# Patient Record
Sex: Female | Born: 1945 | Race: White | Hispanic: No | Smoking: Former smoker
Health system: Southern US, Community
[De-identification: ages and names within clinical notes are randomized; demographics above are authoritative.]

## PROBLEM LIST (undated history)

## (undated) DIAGNOSIS — E039 Hypothyroidism, unspecified: Secondary | ICD-10-CM

## (undated) DIAGNOSIS — K219 Gastro-esophageal reflux disease without esophagitis: Secondary | ICD-10-CM

## (undated) DIAGNOSIS — R195 Other fecal abnormalities: Secondary | ICD-10-CM

## (undated) DIAGNOSIS — C439 Malignant melanoma of skin, unspecified: Secondary | ICD-10-CM

## (undated) DIAGNOSIS — T7840XA Allergy, unspecified, initial encounter: Secondary | ICD-10-CM

## (undated) DIAGNOSIS — E785 Hyperlipidemia, unspecified: Secondary | ICD-10-CM

## (undated) DIAGNOSIS — R197 Diarrhea, unspecified: Secondary | ICD-10-CM

## (undated) DIAGNOSIS — R011 Cardiac murmur, unspecified: Secondary | ICD-10-CM

## (undated) DIAGNOSIS — Z5189 Encounter for other specified aftercare: Secondary | ICD-10-CM

## (undated) DIAGNOSIS — Z8719 Personal history of other diseases of the digestive system: Secondary | ICD-10-CM

## (undated) DIAGNOSIS — D509 Iron deficiency anemia, unspecified: Secondary | ICD-10-CM

## (undated) DIAGNOSIS — T8859XA Other complications of anesthesia, initial encounter: Secondary | ICD-10-CM

## (undated) DIAGNOSIS — R0600 Dyspnea, unspecified: Secondary | ICD-10-CM

## (undated) DIAGNOSIS — D649 Anemia, unspecified: Secondary | ICD-10-CM

## (undated) DIAGNOSIS — D126 Benign neoplasm of colon, unspecified: Secondary | ICD-10-CM

## (undated) DIAGNOSIS — C4491 Basal cell carcinoma of skin, unspecified: Secondary | ICD-10-CM

## (undated) DIAGNOSIS — K644 Residual hemorrhoidal skin tags: Secondary | ICD-10-CM

## (undated) DIAGNOSIS — T4145XA Adverse effect of unspecified anesthetic, initial encounter: Secondary | ICD-10-CM

## (undated) DIAGNOSIS — E042 Nontoxic multinodular goiter: Secondary | ICD-10-CM

## (undated) DIAGNOSIS — M810 Age-related osteoporosis without current pathological fracture: Secondary | ICD-10-CM

## (undated) DIAGNOSIS — C799 Secondary malignant neoplasm of unspecified site: Secondary | ICD-10-CM

## (undated) DIAGNOSIS — J45909 Unspecified asthma, uncomplicated: Secondary | ICD-10-CM

## (undated) DIAGNOSIS — M1990 Unspecified osteoarthritis, unspecified site: Secondary | ICD-10-CM

## (undated) DIAGNOSIS — C4492 Squamous cell carcinoma of skin, unspecified: Secondary | ICD-10-CM

## (undated) DIAGNOSIS — H269 Unspecified cataract: Secondary | ICD-10-CM

## (undated) DIAGNOSIS — R350 Frequency of micturition: Secondary | ICD-10-CM

## (undated) HISTORY — DX: Benign neoplasm of colon, unspecified: D12.6

## (undated) HISTORY — DX: Secondary malignant neoplasm of unspecified site: C79.9

## (undated) HISTORY — DX: Basal cell carcinoma of skin, unspecified: C44.91

## (undated) HISTORY — DX: Hyperlipidemia, unspecified: E78.5

## (undated) HISTORY — DX: Malignant melanoma of skin, unspecified: C43.9

## (undated) HISTORY — DX: Cardiac murmur, unspecified: R01.1

## (undated) HISTORY — PX: SPINE SURGERY: SHX786

## (undated) HISTORY — DX: Anemia, unspecified: D64.9

## (undated) HISTORY — PX: TOTAL HIP ARTHROPLASTY: SHX124

## (undated) HISTORY — DX: Residual hemorrhoidal skin tags: K64.4

## (undated) HISTORY — DX: Unspecified cataract: H26.9

## (undated) HISTORY — DX: Encounter for other specified aftercare: Z51.89

## (undated) HISTORY — DX: Allergy, unspecified, initial encounter: T78.40XA

## (undated) HISTORY — DX: Gastro-esophageal reflux disease without esophagitis: K21.9

## (undated) HISTORY — DX: Age-related osteoporosis without current pathological fracture: M81.0

## (undated) HISTORY — DX: Hypothyroidism, unspecified: E03.9

## (undated) HISTORY — DX: Iron deficiency anemia, unspecified: D50.9

## (undated) HISTORY — PX: FRACTURE SURGERY: SHX138

## (undated) HISTORY — PX: JOINT REPLACEMENT: SHX530

## (undated) HISTORY — DX: Nontoxic multinodular goiter: E04.2

## (undated) HISTORY — PX: ORIF ANKLE FRACTURE: SUR919

## (undated) HISTORY — DX: Squamous cell carcinoma of skin, unspecified: C44.92

## (undated) HISTORY — DX: Other fecal abnormalities: R19.5

---

## 1982-07-12 HISTORY — PX: ABDOMINAL HYSTERECTOMY: SHX81

## 1988-07-12 HISTORY — PX: CHOLECYSTECTOMY: SHX55

## 1991-07-13 DIAGNOSIS — C4372 Malignant melanoma of left lower limb, including hip: Secondary | ICD-10-CM | POA: Insufficient documentation

## 1991-07-13 HISTORY — PX: MELANOMA EXCISION: SHX5266

## 1999-05-12 LAB — HM PAP SMEAR

## 2004-07-02 ENCOUNTER — Ambulatory Visit: Payer: Self-pay | Admitting: Internal Medicine

## 2004-12-01 ENCOUNTER — Ambulatory Visit: Payer: Self-pay | Admitting: General Surgery

## 2004-12-04 ENCOUNTER — Ambulatory Visit: Payer: Self-pay | Admitting: General Surgery

## 2004-12-24 ENCOUNTER — Inpatient Hospital Stay: Payer: Self-pay | Admitting: General Surgery

## 2005-01-18 ENCOUNTER — Ambulatory Visit: Payer: Self-pay | Admitting: Oncology

## 2005-03-01 ENCOUNTER — Ambulatory Visit: Payer: Self-pay | Admitting: Oncology

## 2005-03-12 ENCOUNTER — Ambulatory Visit: Payer: Self-pay | Admitting: Oncology

## 2005-04-11 ENCOUNTER — Ambulatory Visit: Payer: Self-pay | Admitting: Oncology

## 2005-05-24 ENCOUNTER — Ambulatory Visit: Payer: Self-pay | Admitting: Oncology

## 2005-06-14 ENCOUNTER — Ambulatory Visit: Payer: Self-pay | Admitting: Oncology

## 2005-07-12 ENCOUNTER — Ambulatory Visit: Payer: Self-pay | Admitting: Oncology

## 2005-09-15 ENCOUNTER — Ambulatory Visit: Payer: Self-pay | Admitting: Oncology

## 2005-10-22 ENCOUNTER — Ambulatory Visit: Payer: Self-pay | Admitting: Oncology

## 2005-12-03 ENCOUNTER — Ambulatory Visit: Payer: Self-pay | Admitting: Oncology

## 2005-12-10 HISTORY — PX: OTHER SURGICAL HISTORY: SHX169

## 2006-01-14 ENCOUNTER — Ambulatory Visit: Payer: Self-pay | Admitting: Oncology

## 2006-02-25 ENCOUNTER — Ambulatory Visit: Payer: Self-pay | Admitting: Oncology

## 2006-04-01 ENCOUNTER — Ambulatory Visit: Payer: Self-pay | Admitting: Oncology

## 2006-04-08 ENCOUNTER — Ambulatory Visit: Payer: Self-pay | Admitting: Oncology

## 2006-07-21 ENCOUNTER — Ambulatory Visit: Payer: Self-pay | Admitting: Oncology

## 2006-08-12 HISTORY — PX: COLONOSCOPY: SHX174

## 2006-09-05 ENCOUNTER — Ambulatory Visit: Payer: Self-pay | Admitting: Unknown Physician Specialty

## 2006-10-21 ENCOUNTER — Ambulatory Visit: Payer: Self-pay | Admitting: Oncology

## 2006-11-10 ENCOUNTER — Ambulatory Visit: Payer: Self-pay | Admitting: Oncology

## 2007-01-10 ENCOUNTER — Ambulatory Visit: Payer: Self-pay | Admitting: Oncology

## 2007-01-17 ENCOUNTER — Ambulatory Visit: Payer: Self-pay | Admitting: General Surgery

## 2007-01-20 ENCOUNTER — Ambulatory Visit: Payer: Self-pay | Admitting: Oncology

## 2007-01-27 ENCOUNTER — Ambulatory Visit: Payer: Self-pay | Admitting: Oncology

## 2007-02-10 ENCOUNTER — Ambulatory Visit: Payer: Self-pay | Admitting: Oncology

## 2007-05-13 ENCOUNTER — Ambulatory Visit: Payer: Self-pay | Admitting: Oncology

## 2007-05-31 ENCOUNTER — Ambulatory Visit: Payer: Self-pay | Admitting: Oncology

## 2007-06-12 ENCOUNTER — Ambulatory Visit: Payer: Self-pay | Admitting: Oncology

## 2007-07-13 ENCOUNTER — Ambulatory Visit: Payer: Self-pay | Admitting: Oncology

## 2007-08-13 ENCOUNTER — Ambulatory Visit: Payer: Self-pay | Admitting: Oncology

## 2007-08-18 ENCOUNTER — Ambulatory Visit: Payer: Self-pay | Admitting: General Surgery

## 2007-11-10 ENCOUNTER — Ambulatory Visit: Payer: Self-pay | Admitting: Oncology

## 2007-12-11 ENCOUNTER — Ambulatory Visit: Payer: Self-pay | Admitting: Oncology

## 2007-12-27 ENCOUNTER — Ambulatory Visit: Payer: Self-pay | Admitting: Oncology

## 2007-12-29 ENCOUNTER — Ambulatory Visit: Payer: Self-pay | Admitting: Oncology

## 2008-06-11 ENCOUNTER — Ambulatory Visit: Payer: Self-pay | Admitting: Oncology

## 2008-07-01 ENCOUNTER — Ambulatory Visit: Payer: Self-pay | Admitting: Oncology

## 2008-07-12 ENCOUNTER — Ambulatory Visit: Payer: Self-pay | Admitting: Oncology

## 2008-09-09 ENCOUNTER — Ambulatory Visit: Payer: Self-pay | Admitting: Oncology

## 2008-09-30 ENCOUNTER — Ambulatory Visit: Payer: Self-pay | Admitting: Oncology

## 2008-10-03 ENCOUNTER — Ambulatory Visit: Payer: Self-pay | Admitting: Oncology

## 2008-10-10 ENCOUNTER — Ambulatory Visit: Payer: Self-pay | Admitting: Oncology

## 2009-03-12 ENCOUNTER — Ambulatory Visit: Payer: Self-pay | Admitting: Oncology

## 2009-04-09 ENCOUNTER — Ambulatory Visit: Payer: Self-pay | Admitting: Oncology

## 2009-04-11 ENCOUNTER — Ambulatory Visit: Payer: Self-pay | Admitting: Oncology

## 2009-04-23 ENCOUNTER — Ambulatory Visit: Payer: Self-pay | Admitting: Oncology

## 2009-05-29 ENCOUNTER — Ambulatory Visit: Payer: Self-pay | Admitting: Internal Medicine

## 2009-09-09 ENCOUNTER — Ambulatory Visit: Payer: Self-pay | Admitting: Oncology

## 2009-09-10 ENCOUNTER — Ambulatory Visit: Payer: Self-pay | Admitting: Unknown Physician Specialty

## 2009-09-10 HISTORY — PX: COLONOSCOPY: SHX174

## 2009-09-10 HISTORY — PX: ESOPHAGOGASTRODUODENOSCOPY: SHX1529

## 2009-09-10 LAB — HM COLONOSCOPY: HM Colonoscopy: 8

## 2009-10-06 ENCOUNTER — Ambulatory Visit: Payer: Self-pay | Admitting: Oncology

## 2009-10-10 ENCOUNTER — Ambulatory Visit: Payer: Self-pay | Admitting: Oncology

## 2010-02-09 ENCOUNTER — Ambulatory Visit: Payer: Self-pay | Admitting: Oncology

## 2010-03-05 ENCOUNTER — Ambulatory Visit: Payer: Self-pay | Admitting: Oncology

## 2010-03-09 ENCOUNTER — Ambulatory Visit: Payer: Self-pay | Admitting: Oncology

## 2010-05-18 ENCOUNTER — Ambulatory Visit: Payer: Self-pay | Admitting: Internal Medicine

## 2010-06-10 LAB — HM MAMMOGRAPHY: HM Mammogram: NORMAL

## 2010-08-20 ENCOUNTER — Encounter: Payer: Self-pay | Admitting: Cardiovascular Disease

## 2010-09-02 ENCOUNTER — Ambulatory Visit: Payer: Self-pay | Admitting: Internal Medicine

## 2010-09-02 ENCOUNTER — Telehealth (INDEPENDENT_AMBULATORY_CARE_PROVIDER_SITE_OTHER): Payer: Self-pay | Admitting: Radiology

## 2010-09-03 ENCOUNTER — Encounter (HOSPITAL_COMMUNITY): Payer: Self-pay

## 2010-09-08 NOTE — Progress Notes (Signed)
Summary: nuc pre-procedure  Phone Note Outgoing Call   Call placed by: Harlow Asa CNMT Call placed to: Patient Reason for Call: Confirm/change Appt Summary of Call: Reviewed information on [employer] Information Sheet (see scanned document for further details).  Spoke with patient.      Nuclear Med Background Indications for Stress Test: Evaluation for Ischemia     Symptoms: Chest Pain, Chest Pressure with Exertion, DOE, Fatigue  Symptoms Comments: muscle weakness

## 2010-09-10 ENCOUNTER — Ambulatory Visit: Payer: Self-pay | Admitting: Oncology

## 2010-09-22 ENCOUNTER — Ambulatory Visit: Payer: Self-pay | Admitting: Cardiovascular Disease

## 2010-09-30 ENCOUNTER — Ambulatory Visit (INDEPENDENT_AMBULATORY_CARE_PROVIDER_SITE_OTHER): Payer: BC Managed Care – PPO

## 2010-09-30 ENCOUNTER — Encounter: Payer: Self-pay | Admitting: Cardiovascular Disease

## 2010-09-30 ENCOUNTER — Ambulatory Visit (INDEPENDENT_AMBULATORY_CARE_PROVIDER_SITE_OTHER): Payer: BC Managed Care – PPO | Admitting: Cardiovascular Disease

## 2010-09-30 VITALS — BP 120/72 | HR 91 | Resp 16

## 2010-09-30 VITALS — BP 146/80 | HR 81 | Ht 63.0 in | Wt 220.8 lb

## 2010-09-30 DIAGNOSIS — R0789 Other chest pain: Secondary | ICD-10-CM

## 2010-09-30 DIAGNOSIS — R0602 Shortness of breath: Secondary | ICD-10-CM | POA: Insufficient documentation

## 2010-09-30 DIAGNOSIS — E785 Hyperlipidemia, unspecified: Secondary | ICD-10-CM | POA: Insufficient documentation

## 2010-09-30 NOTE — Progress Notes (Signed)
Janet Rice was exercised on a manual protocol as she had significant leg pain with exertion. Reason for the treadmill study was she was having shortness of breath and chest pain with exertion  Resting blood pressure was 120/70, resting heart rate 91 beats per minute. She exercised for a total of 10 minutes, achieved 6.6 METS, with peak heart rate 154 beats per minute. Blood pressure 178/82. No significant EKG changes were noted with exertion at peak or in recovery.  Final impression: This was a normal treadmill study with good exercise tolerance given her underlying leg and lower back disease with no significant EKG changes concerning for ischemia. Peak heart rate 154 beats per minute with no symptoms of chest discomfort or shortness of breath noted. I suspect her recent chest pain episodes are atypical and probably  Noncardiac.

## 2010-09-30 NOTE — Assessment & Plan Note (Signed)
She does have hyperlipidemia. We have encouraged her to watch her diet, lose weight. I suggested she discuss various medication treatment options with Dr. Dan Humphreys.

## 2010-09-30 NOTE — Patient Instructions (Addendum)
Your physician recommends that you schedule a follow-up as needed. Start taking Aspirin 81mg  every day.

## 2010-09-30 NOTE — Progress Notes (Signed)
Patient ID: Janet Rice, female    DOB: May 05, 1946, 65 y.o.   MRN: [id number]  HPI Janet Rice is a very pleasant 65 year old woman with a history of obesity, hyperlipidemia, lumbar stenosis, chronic left leg pain status post lymph node resection for melanoma who presents for evaluation of worsening shortness of breath, chest pain with exertion. She is a patient of Dr. Ronna Polio.  She reports that she has had worsening shortness of breath and chest pain with exertion for the past several years though this has been getting worse over the past month or so. She notices it more when she is climbing up a hill or stairs. She denies any significant lightheadedness, dizziness, cough or lower extremity edema. She does not have these symptoms at rest. She comments that she hasn't noticed this discomfort while trying to keep up with her husband, climbing the way up to go to baseball games. She is uncertain if she is anxious, hunching over or clenching her shoulders.   WUJ:[id number] normal sinus rhythm with rate of 81 beats per minute, low voltage, no significant ST or T wave changes   Allergies  Allergen Reactions  . Sulfa Antibiotics     Outpatient Encounter Prescriptions as of 09/30/2010  Medication Sig Dispense Refill  . aspirin 81 MG tablet Take 81 mg by mouth daily.        . citalopram (CELEXA) 20 MG tablet Take 20 mg by mouth daily.        Marland Kitchen levothyroxine (SYNTHROID, LEVOTHROID) 137 MCG tablet Take 137 mcg by mouth daily.        Marland Kitchen omeprazole (PRILOSEC) 20 MG capsule Take 20 mg by mouth daily.        . pravastatin (PRAVACHOL) 40 MG tablet Take 20 mg by mouth daily.          Past Medical History  Diagnosis Date  . Hypothyroidism   . Hyperlipidemia     Past Surgical History  Procedure Date  . Abdominal hysterectomy 1980  . Melanoma excision 1993    thigh  . Lymph node removal 2006    Social History  reports that she quit smoking about 32 years ago. Her smoking use included  Cigarettes. She has a 4 pack-year smoking history. She has never used smokeless tobacco. She reports that she does not drink alcohol or use illicit drugs.  Family History family history includes Aneurysm in her mother.   Review of Systems  Constitutional: Negative.   HENT: Negative.   Eyes: Negative.   Respiratory: Negative.   Cardiovascular: Negative.   Gastrointestinal: Negative.   Musculoskeletal: Negative.   Skin: Negative.   Neurological: Negative.   Hematological: Negative.   Psychiatric/Behavioral: Negative.   All other systems reviewed and are negative.    BP 146/80  Pulse 81  Ht 5\' 3"  (1.6 m)  Wt 220 lb 12.8 oz (100.154 kg)  BMI 39.11 kg/m2   Physical Exam  Nursing note and vitals reviewed. Constitutional: She is oriented to person, place, and time. She appears well-developed and well-nourished.  HENT:  Head: Normocephalic.  Nose: Nose normal.  Mouth/Throat: Oropharynx is clear and moist.  Eyes: Conjunctivae are normal. Pupils are equal, round, and reactive to light.  Neck: Normal range of motion. Neck supple. No JVD present.  Cardiovascular: Normal rate, regular rhythm, normal heart sounds and intact distal pulses.  Exam reveals no gallop and no friction rub.   No murmur heard. Pulmonary/Chest: Effort normal and breath sounds normal. No respiratory distress. She has  no wheezes. She has no rales. She exhibits no tenderness.  Abdominal: Soft. Bowel sounds are normal. She exhibits no distension. There is no tenderness.  Musculoskeletal: Normal range of motion. She exhibits no edema and no tenderness.  Lymphadenopathy:    She has no cervical adenopathy.  Neurological: She is alert and oriented to person, place, and time. Coordination normal.  Skin: Skin is warm and dry. No rash noted. No erythema.  Psychiatric: She has a normal mood and affect. Her behavior is normal. Judgment and thought content normal.     Assessment and Plan

## 2010-09-30 NOTE — Assessment & Plan Note (Addendum)
She does have significant shortness of breath with heavy exertion. Some of this could be secondary to deconditioning. We will again have her complate a treadmill to watch her heart rate and blood pressure with exertion.

## 2010-09-30 NOTE — Assessment & Plan Note (Addendum)
Etiology of her chest tightness with exertion is uncertain. We have scheduled her for a treadmill study to exclude ischemia.

## 2010-09-30 NOTE — Progress Notes (Deleted)
   Patient ID: Janet Rice, female    DOB: 1946-01-19, 65 y.o.   MRN: [id number]  HPI    Review of Systems    Physical Exam Janet Rice was exercised on a manual protocol as she had significant leg pain with exertion. Reason for the treadmill study was she was having shortness of breath and chest pain with exertion  Resting blood pressure was 120/70, resting heart rate 91 beats per minute. She exercised for a total of 10 minutes, achieved 6.6 METS, with peak heart rate 154 beats per minute. Blood pressure 178/82. No significant EKG changes were noted with exertion at peak or in recovery.  Final impression: This was a normal treadmill study with good exercise tolerance given her underlying leg and lower back disease with no significant EKG changes concerning for ischemia. Peak heart rate 154 beats per minute with no symptoms of chest discomfort or shortness of breath noted. I suspect her recent chest pain episodes are atypical and probably  Noncardiac.

## 2010-10-11 ENCOUNTER — Ambulatory Visit: Payer: Self-pay | Admitting: Oncology

## 2011-01-14 ENCOUNTER — Ambulatory Visit: Payer: Self-pay | Admitting: Oncology

## 2011-02-10 ENCOUNTER — Ambulatory Visit: Payer: Self-pay | Admitting: Oncology

## 2011-02-18 ENCOUNTER — Encounter: Payer: Self-pay | Admitting: Internal Medicine

## 2011-03-13 ENCOUNTER — Ambulatory Visit: Payer: Self-pay | Admitting: Oncology

## 2011-04-12 ENCOUNTER — Ambulatory Visit: Payer: Self-pay | Admitting: Oncology

## 2011-04-27 ENCOUNTER — Telehealth: Payer: Self-pay | Admitting: Internal Medicine

## 2011-04-27 NOTE — Telephone Encounter (Signed)
Patient wants an appointment made for her Mammogram.

## 2011-04-27 NOTE — Telephone Encounter (Signed)
We can give her the number for [facility] and she can set up a time for her mammogram that is convenient for her.

## 2011-04-30 NOTE — Telephone Encounter (Signed)
Gave pt [facility] phone number to make her own mammogram appointment/[facility]

## 2011-05-12 ENCOUNTER — Encounter: Payer: Self-pay | Admitting: Internal Medicine

## 2011-05-12 ENCOUNTER — Ambulatory Visit (INDEPENDENT_AMBULATORY_CARE_PROVIDER_SITE_OTHER): Payer: Medicare Other | Admitting: Internal Medicine

## 2011-05-12 VITALS — BP 124/82 | HR 66 | Temp 98.3°F | Resp 14 | Ht 63.0 in | Wt 217.8 lb

## 2011-05-12 DIAGNOSIS — E785 Hyperlipidemia, unspecified: Secondary | ICD-10-CM

## 2011-05-12 DIAGNOSIS — E039 Hypothyroidism, unspecified: Secondary | ICD-10-CM

## 2011-05-12 DIAGNOSIS — D509 Iron deficiency anemia, unspecified: Secondary | ICD-10-CM

## 2011-05-12 DIAGNOSIS — Z23 Encounter for immunization: Secondary | ICD-10-CM

## 2011-05-12 DIAGNOSIS — Z8249 Family history of ischemic heart disease and other diseases of the circulatory system: Secondary | ICD-10-CM

## 2011-05-12 DIAGNOSIS — I10 Essential (primary) hypertension: Secondary | ICD-10-CM

## 2011-05-12 NOTE — Progress Notes (Signed)
Subjective:    Patient ID: Janet Rice, female    DOB: 1946/05/11, 65 y.o.   MRN: [id number]  HPI 65 year old female with a history of hypothyroidism and hyperlipidemia presents for followup. She reports that she has been feeling well. She continues to have some mild fatigue. She reports full compliance with her medications. She denies any side effects from her medications. She was recently treated for iron deficiency anemia by hematology with serial iron infusions. She denies any complications with this treatment. She reports that labs were completed after therapy in showing adequate iron stores. She notes that she was instructed to followup with GI for upper endoscopy and colonoscopy but would prefer a referral to another physician. She denies any black stools or blood in her stool. She denies any vomiting. She reports that she is sleeping well. She is active in her job.  Patient notes today that she has a strong family history of abdominal aortic aneurysm. She notes that her mother died suddenly of this. As above, she denies abdominal pain or other symptoms to suggest aneurysm, however she would like to get an ultrasound evaluation to ensure no aneurysm is present.  Outpatient Encounter Prescriptions as of 05/12/2011  Medication Sig Dispense Refill  . aspirin 81 MG tablet Take 81 mg by mouth daily.        . citalopram (CELEXA) 20 MG tablet Take 20 mg by mouth daily.        Marland Kitchen levothyroxine (SYNTHROID, LEVOTHROID) 137 MCG tablet Take 137 mcg by mouth daily.        Marland Kitchen omeprazole (PRILOSEC) 20 MG capsule Take 20 mg by mouth daily.        . pravastatin (PRAVACHOL) 40 MG tablet Take 20 mg by mouth daily.          Review of Systems  Constitutional: Positive for fatigue. Negative for fever, chills, appetite change and unexpected weight change.  HENT: Negative for ear pain, congestion, sore throat, trouble swallowing, neck pain, voice change and sinus pressure.   Eyes: Negative for visual  disturbance.  Respiratory: Negative for cough, shortness of breath, wheezing and stridor.   Cardiovascular: Negative for chest pain, palpitations and leg swelling.  Gastrointestinal: Negative for nausea, vomiting, abdominal pain, diarrhea, constipation, blood in stool, abdominal distention and anal bleeding.  Genitourinary: Negative for dysuria and flank pain.  Musculoskeletal: Negative for myalgias, arthralgias and gait problem.  Skin: Negative for color change and rash.  Neurological: Negative for dizziness and headaches.  Hematological: Negative for adenopathy. Does not bruise/bleed easily.  Psychiatric/Behavioral: Negative for suicidal ideas, sleep disturbance and dysphoric mood. The patient is not nervous/anxious.    BP 124/82  Pulse 66  Temp(Src) 98.3 F (36.8 C) (Oral)  Resp 14  Ht 5\' 3"  (1.6 m)  Wt 217 lb 12 oz (98.771 kg)  BMI 38.57 kg/m2  SpO2 98%     Objective:   Physical Exam  Constitutional: She is oriented to person, place, and time. She appears well-developed and well-nourished. No distress.  HENT:  Head: Normocephalic and atraumatic.  Right Ear: External ear normal.  Left Ear: External ear normal.  Nose: Nose normal.  Mouth/Throat: Oropharynx is clear and moist. No oropharyngeal exudate.  Eyes: Conjunctivae are normal. Pupils are equal, round, and reactive to light. Right eye exhibits no discharge. Left eye exhibits no discharge. No scleral icterus.  Neck: Normal range of motion. Neck supple. No tracheal deviation present. No thyromegaly present.  Cardiovascular: Normal rate, regular rhythm, normal heart sounds and  intact distal pulses.  Exam reveals no gallop and no friction rub.   No murmur heard. Pulmonary/Chest: Effort normal and breath sounds normal. No respiratory distress. She has no wheezes. She has no rales. She exhibits no tenderness.  Musculoskeletal: Normal range of motion. She exhibits no edema and no tenderness.  Lymphadenopathy:    She has no  cervical adenopathy.  Neurological: She is alert and oriented to person, place, and time. No cranial nerve deficit. She exhibits normal muscle tone. Coordination normal.  Skin: Skin is warm and dry. No rash noted. She is not diaphoretic. No erythema. No pallor.  Psychiatric: She has a normal mood and affect. Her behavior is normal. Judgment and thought content normal.          Assessment & Plan:  1. Hypothryoidism -will check TSH with labs today. TSH has been normal even in the setting of ongoing fatigue. We'll continue Synthroid at current dose and adjust as needed based on TSH level.  2. Hyperlipidemia -will check fasting lipids and LFTs with labs today. We'll continue pravastatin.  3. Iron deficiency anemia -patient was recently treated for iron deficiency anemia by hematology with serial iron infusions. The etiology of her iron deficiency was not determined. Her stool Hemoccult testing had been negative. However, would like to get upper and lower endoscopy. She would prefer a referral to [facility]. We will set this up for her. We will check CBC and ferritin with labs today.  4. Fatigue -patient with some chronic fatigue. This has not improved with supplementation of iron. As above we'll check labs including CBC, TSH, electrolytes, kidney and liver function today. If these are normal, would like to proceed with a sleep study to evaluate for sleep apnea as a potential cause of her fatigue. She will followup in one month.  5. Family hx of Abdominal Aortic Aneurysm - will set patient up for abdominal ultrasound for evaluation.

## 2011-05-12 NOTE — Patient Instructions (Signed)
Labs today.  Follow up in 3 months for physical. 

## 2011-05-17 ENCOUNTER — Encounter: Payer: Self-pay | Admitting: Internal Medicine

## 2011-05-18 ENCOUNTER — Telehealth: Payer: Self-pay | Admitting: Internal Medicine

## 2011-05-18 NOTE — Telephone Encounter (Signed)
I can write it on the form tomorrow.

## 2011-05-18 NOTE — Telephone Encounter (Signed)
Britt Boozer called in from [facility] states that family history won't cover the test you want her to have, they stated that it needs to see abdominal pain/ abdominal bruit.  They need this on the order please let me know when it is ready so I can send to them, she see's them on the 14th.  Thanks

## 2011-05-19 ENCOUNTER — Telehealth: Payer: Self-pay | Admitting: Internal Medicine

## 2011-05-19 NOTE — Telephone Encounter (Signed)
Labs from outside showed persistent iron deficiency with ferritin at 11, hgb 10. Can you see if pt has scheduled follow up with hematology and GI?

## 2011-05-19 NOTE — Telephone Encounter (Signed)
I have faxed over referral sheet to [facility] with abdominal pain.

## 2011-05-19 NOTE — Telephone Encounter (Signed)
Patient informed, she has apt w/Dr Margretta Sidle 11/26

## 2011-05-25 ENCOUNTER — Other Ambulatory Visit: Payer: Self-pay | Admitting: Internal Medicine

## 2011-05-27 ENCOUNTER — Telehealth: Payer: Self-pay | Admitting: Internal Medicine

## 2011-05-27 NOTE — Telephone Encounter (Signed)
[place] of abdominal aorta was normal

## 2011-05-27 NOTE — Telephone Encounter (Signed)
Left detailed VM on pt's cell 

## 2011-05-31 ENCOUNTER — Ambulatory Visit: Payer: Self-pay | Admitting: Internal Medicine

## 2011-05-31 ENCOUNTER — Other Ambulatory Visit: Payer: Self-pay | Admitting: Internal Medicine

## 2011-05-31 ENCOUNTER — Encounter: Payer: Self-pay | Admitting: Internal Medicine

## 2011-06-01 ENCOUNTER — Encounter: Payer: Self-pay | Admitting: Internal Medicine

## 2011-06-04 ENCOUNTER — Encounter: Payer: Self-pay | Admitting: Internal Medicine

## 2011-06-04 ENCOUNTER — Encounter: Payer: Self-pay | Admitting: *Deleted

## 2011-06-07 ENCOUNTER — Ambulatory Visit (INDEPENDENT_AMBULATORY_CARE_PROVIDER_SITE_OTHER): Payer: Medicare Other | Admitting: Internal Medicine

## 2011-06-07 ENCOUNTER — Encounter: Payer: Self-pay | Admitting: Internal Medicine

## 2011-06-07 DIAGNOSIS — E611 Iron deficiency: Secondary | ICD-10-CM

## 2011-06-07 DIAGNOSIS — Z86010 Personal history of colonic polyps: Secondary | ICD-10-CM

## 2011-06-07 NOTE — Patient Instructions (Addendum)
Your physician has requested that you have the following labs done at [facility]: CBC, Ferritin, TIBC, iron & Celiac panel. Please fax lab results to Dr. Rhea Belton at [phone]  Dr. Rhea Belton would like you to be seen in the office in 3 months for a follow up.

## 2011-06-08 ENCOUNTER — Encounter: Payer: Self-pay | Admitting: Internal Medicine

## 2011-06-08 DIAGNOSIS — Z86010 Personal history of colonic polyps: Secondary | ICD-10-CM | POA: Insufficient documentation

## 2011-06-08 DIAGNOSIS — E611 Iron deficiency: Secondary | ICD-10-CM | POA: Insufficient documentation

## 2011-06-08 NOTE — Progress Notes (Signed)
Subjective:    Patient ID: [name], female    DOB: 1946-05-20, 65 y.o.   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 Dan Humphreys for evaluation of iron deficiency anemia.  The patient states she recently completed 6 treatments of IV iron in [facility] for iron deficiency anemia. She also reports having her anemia previously worked up by Dr. Lynnae Prude with EGD and colonoscopy in March 2011.  Today she reports that she is healing well. She denies upper GI symptoms, including no nausea or vomiting. No weight loss. Appetite is normal for her. No early satiety. She does have a history of heartburn but she states this is well controlled on omeprazole. She denies dysphagia and odynophagia. Regarding her bowel movements, she states they're somewhat irregular. She says this is dependent on what she eats. She says usually she has one to 2 formed stools per day, but there are times when she will have 4-5 more loose stools in one day. She denies nocturnal stooling. No fecal incontinence. She denies bright red blood per rectum or melena. Occasionally she does feel lower abdominal cramping when she is having loose stools but otherwise she denies abdominal pain. No fevers or chills.  She does report having undergone fecal occult blood testing on 2 occasions over the last several months, and she recalls these both have been negative.  Review of Systems Constitutional: Negative for fever, chills, night sweats, activity change, appetite change and unexpected weight change HEENT: Negative for sore throat, mouth sores and trouble swallowing. Eyes: Negative for visual disturbance Respiratory: Negative for cough, chest tightness and shortness of breath Cardiovascular: Negative for chest  pain, palpitations and lower extremity swelling Gastrointestinal: See history of present illness Genitourinary: Negative for dysuria and hematuria. Musculoskeletal: Positive for back pain, negative arthralgias and myalgias Skin: Negative for rash or color change Neurological: Negative for headaches, weakness, numbness Hematological: Negative for adenopathy, negative for easy bruising/bleeding Psychiatric/behavioral: Negative for depressed mood, negative for anxiety   Past Medical History  Diagnosis Date  . Hypothyroidism   . Hyperlipidemia   . Hypertension   . Iron deficiency anemia   . Heme positive stool   . Adenomatous colon polyp   . Metastatic melanoma     Followed by Dr Doylene Canning, previous chemo, no recurrence  . External hemorrhoids   . Chronic heartburn     On omeprazole  . GERD (gastroesophageal reflux disease)    Past Surgical History  Procedure Date  . Abdominal hysterectomy 1984  . Melanoma excision 1993    thigh  . Lymph node removal 12/2005    Left inguinal lymph node dissection  . Cholecystectomy 1990  . Colonoscopy 08/2006    Four sessile polyps found and removed in sigmoid colon at splenic flexure and ascending colon. 7 mm in size. Another removed from transverse colon 4 mm. Path Report - showed tubular adenoma and hyperplastic polyp. Advised to repeat in 3.5 years (02/2010)  . Colonoscopy 3.2.2011    8 mm polyp in sigmoid colon, removed, 4 mm polyp in descending colon, removed. Internal hemorrhoids  . Esophagogastroduodenoscopy 3.2.2011    Large hiatia hernia, esophagus normal, mulitple small sessile polyps w/no stigmata of recent bleeding found. Mildy erythermatous mucosa w/no bleeding found in gatric antrum. Normal duodenum. Bx done of gastric mucoal abnormalitiy  and duodeunum. PATH - no active inflammation, antral mucosa w/mild foveolar hyperplasia   Current Outpatient Prescriptions  Medication Sig Dispense Refill  . aspirin 81 MG tablet Take 81 mg by mouth  daily.        . Calcium-Vitamin D 600-125 MG-UNIT TABS Take 1 tablet by mouth daily.        . citalopram (CELEXA) 20 MG tablet TAKE ONE TABLET BY MOUTH EVERY DAY  90 tablet  4  . levothyroxine (SYNTHROID, LEVOTHROID) 137 MCG tablet Take 137 mcg by mouth daily.        . Multiple Vitamin (MULTIVITAMIN) tablet Take 1 tablet by mouth daily.        Marland Kitchen omeprazole (PRILOSEC) 20 MG capsule Take 20 mg by mouth daily.        . pravastatin (PRAVACHOL) 40 MG tablet TAKE ONE-HALF TABLET BY MOUTH EVERY DAY  30 tablet  3   Allergies  Allergen Reactions  . Sulfa Antibiotics    Family History  Problem Relation Age of Onset  . Aneurysm Mother   . Heart disease Mother   . Leukemia Maternal Grandfather   . Skin cancer Paternal Grandfather   . Diabetes Other   . Colon polyps Other   . Colon polyps Father   . Diabetes Father     Social History  . Marital Status: Married    Number of Children: 2   Occupational History  . HR Consultant    Social History Main Topics  . Smoking status: Former Smoker -- 0.5 packs/day for 8 years    Types: Cigarettes    Quit date: 07/12/1978  . Smokeless tobacco: Never Used  . Alcohol Use: No  . Drug Use: No      Objective:   Physical Exam BP 114/68  Pulse 60  Ht 5\' 3"  (1.6 m)  Wt 99.519 kg (219 lb 6.4 oz)  BMI 38.86 kg/m2 Constitutional: Well-developed and well-nourished. No distress. HEENT: Normocephalic and atraumatic. Oropharynx is clear and moist. No oropharyngeal exudate. Conjunctivae are normal. Pupils are equal round and reactive to light. No scleral icterus. Neck: Neck supple. Trachea midline. Cardiovascular: Normal rate, regular rhythm and intact distal pulses. No M/R/G Pulmonary/chest: Effort normal and breath sounds normal. No wheezing, rales or rhonchi. Abdominal: Soft, nontender, nondistended. Bowel sounds active throughout. There are no masses palpable. No hepatosplenomegaly. Extremities: no clubbing, cyanosis, or edema, left leg with TED hose  to mid thigh Lymphadenopathy: No cervical adenopathy noted. Neurological: Alert and oriented to person place and time. Skin: Skin is warm and dry. No rashes noted. Psychiatric: Normal mood and affect. Behavior is normal.     Assessment & Plan:   65 yo female with PMH of metastatic melanoma (though currently be in remission), hypertension, hyperlipidemia, hypothyroidism, GERD, and history of colon polyps who is seen in consultation at the request of Dr. Dan Humphreys for evaluation of iron deficiency anemia.  1. Iron def anemia -- the patient has an unexplained iron deficiency anemia, which has been somewhat persistent over time, however she did recently complete IV iron replacement therapy. I do not know at this time if her iron stores have been repleted successfully. Interestingly however, her stool has been heme negative, which argues against ongoing GI call blood loss.  She has had a recent EGD and colonoscopy, which did not reveal a source of bleeding. Both of these studies are very reassuring. She also has recently had small bowel biopsies, which rules out celiac disease.  At this time, I would like  to repeat her complete blood count along with iron studies to determine if she remains iron deficient. If she is in fact iron deficient, then we discussed repeating the upper endoscopy. She is in agreement with this plan. If the upper endoscopy is again negative, then she would likely need video capsule endoscopy thereafter.  2. History of adenomatous colon polyps -- the patient does have a history of adenomatous colon polyps. Her last colonoscopy was in March 2011, she is due for repeat colonoscopy for colon polyp surveillance in March 2014.  Await labs which have been sent per patient request to [facility].  These will be faxed back to me, and then we can decide how to proceed.

## 2011-06-16 ENCOUNTER — Encounter: Payer: Self-pay | Admitting: Internal Medicine

## 2011-06-29 ENCOUNTER — Other Ambulatory Visit: Payer: Self-pay | Admitting: Internal Medicine

## 2011-08-09 ENCOUNTER — Telehealth: Payer: Self-pay | Admitting: *Deleted

## 2011-08-09 DIAGNOSIS — E785 Hyperlipidemia, unspecified: Secondary | ICD-10-CM

## 2011-08-09 NOTE — Telephone Encounter (Signed)
Please put in [facility] lab orders. Pt has CPE coming up and is a [facility] employee.

## 2011-08-10 ENCOUNTER — Other Ambulatory Visit: Payer: Self-pay | Admitting: Internal Medicine

## 2011-08-10 DIAGNOSIS — Z0000 Encounter for general adult medical examination without abnormal findings: Secondary | ICD-10-CM | POA: Diagnosis not present

## 2011-08-10 DIAGNOSIS — E785 Hyperlipidemia, unspecified: Secondary | ICD-10-CM | POA: Diagnosis not present

## 2011-08-11 LAB — COMPREHENSIVE METABOLIC PANEL
ALT: 20 IU/L (ref 0–32)
AST: 18 IU/L (ref 0–40)
Albumin/Globulin Ratio: 1.5 (ref 1.1–2.5)
Albumin: 4 g/dL (ref 3.6–4.8)
Alkaline Phosphatase: 116 IU/L (ref 25–165)
BUN/Creatinine Ratio: 6 — ABNORMAL LOW (ref 11–26)
BUN: 6 mg/dL — ABNORMAL LOW (ref 8–27)
CO2: 27 mmol/L (ref 20–32)
Calcium: 9 mg/dL (ref 8.6–10.2)
Chloride: 102 mmol/L (ref 97–108)
Creatinine, Ser: 1.06 mg/dL — ABNORMAL HIGH (ref 0.57–1.00)
GFR calc Af Amer: 64 mL/min/{1.73_m2} (ref >59)
GFR calc non Af Amer: 55 mL/min/{1.73_m2} — ABNORMAL LOW (ref >59)
Globulin, Total: 2.7 g/dL (ref 1.5–4.5)
Glucose: 91 mg/dL (ref 65–99)
Potassium: 4.7 mmol/L (ref 3.5–5.2)
Sodium: 142 mmol/L (ref 134–144)
Total Bilirubin: 0.2 mg/dL (ref 0.0–1.2)
Total Protein: 6.7 g/dL (ref 6.0–8.5)

## 2011-08-11 LAB — CBC WITH DIFFERENTIAL
Basophils Absolute: 0 10*3/uL (ref 0.0–0.2)
Basos: 1 % (ref 0–3)
Eos: 2 % (ref 0–7)
Eosinophils Absolute: 0.1 10*3/uL (ref 0.0–0.4)
HCT: 27.9 % — ABNORMAL LOW (ref 34.0–46.6)
Hemoglobin: 8.3 g/dL — ABNORMAL LOW (ref 11.1–15.9)
Immature Grans (Abs): 0 10*3/uL (ref 0.0–0.1)
Immature Granulocytes: 0 % (ref 0–2)
Lymphocytes Absolute: 1.7 10*3/uL (ref 0.7–4.5)
Lymphs: 30 % (ref 14–46)
MCH: 19.6 pg — ABNORMAL LOW (ref 26.6–33.0)
MCHC: 29.7 g/dL — ABNORMAL LOW (ref 31.5–35.7)
MCV: 66 fL — ABNORMAL LOW (ref 79–97)
Monocytes Absolute: 0.4 10*3/uL (ref 0.1–1.0)
Monocytes: 7 % (ref 4–13)
Neutrophils Absolute: 3.5 10*3/uL (ref 1.8–7.8)
Neutrophils Relative %: 60 % (ref 40–74)
Platelets: 189 10*3/uL (ref 140–415)
RBC: 4.23 x10E6/uL (ref 3.77–5.28)
RDW: 15.9 % — ABNORMAL HIGH (ref 12.3–15.4)
WBC: 5.8 10*3/uL (ref 4.0–10.5)

## 2011-08-11 LAB — LIPID PANEL WITH LDL/HDL RATIO
Cholesterol, Total: 178 mg/dL (ref 100–199)
HDL: 48 mg/dL (ref >39)
LDL Calculated: 105 mg/dL — ABNORMAL HIGH (ref 0–99)
LDl/HDL Ratio: 2.2 ratio units (ref 0.0–3.2)
Triglycerides: 123 mg/dL (ref 0–149)
VLDL Cholesterol Cal: 25 mg/dL (ref 5–40)

## 2011-08-13 ENCOUNTER — Telehealth: Payer: Self-pay | Admitting: Internal Medicine

## 2011-08-13 ENCOUNTER — Encounter: Payer: Self-pay | Admitting: Internal Medicine

## 2011-08-13 ENCOUNTER — Ambulatory Visit (INDEPENDENT_AMBULATORY_CARE_PROVIDER_SITE_OTHER): Payer: Medicare Other | Admitting: Internal Medicine

## 2011-08-13 VITALS — BP 118/64 | HR 92 | Temp 98.6°F | Ht 61.5 in | Wt 212.0 lb

## 2011-08-13 DIAGNOSIS — E669 Obesity, unspecified: Secondary | ICD-10-CM | POA: Diagnosis not present

## 2011-08-13 DIAGNOSIS — Z0000 Encounter for general adult medical examination without abnormal findings: Secondary | ICD-10-CM | POA: Diagnosis not present

## 2011-08-13 DIAGNOSIS — E785 Hyperlipidemia, unspecified: Secondary | ICD-10-CM

## 2011-08-13 DIAGNOSIS — D509 Iron deficiency anemia, unspecified: Secondary | ICD-10-CM | POA: Diagnosis not present

## 2011-08-13 MED ORDER — PRAVASTATIN SODIUM 40 MG PO TABS: 40.0000 mg | ORAL_TABLET | Freq: Every day | ORAL | Status: DC

## 2011-08-13 NOTE — Assessment & Plan Note (Signed)
Patient is up-to-date on health maintenance with the exception of pneumonia vaccine. She would prefer to get this vaccine when she comes to clinic for labs next week. She was also given prescription for her shingles vaccine to obtain in the next month.

## 2011-08-13 NOTE — Assessment & Plan Note (Addendum)
Patient noted to have progressive iron deficiency anemia. Likely secondary to GI loss, perhaps made worse by recent viral gastroenteritis. Will set her back up with Dr. Rhea Belton in GI medicine for possible repeat upper endoscopy and capsule study. Will also have her follow up with Dr. Koleen Nimrod in hematology for iron infusions. We will have her return to clinic next week to recheck her CBC and ferritin. She will call or return to clinic sooner if she has any symptoms of fatigue, shortness of breath, chest pain, or other concerns. Follow up 1 month.

## 2011-08-13 NOTE — Telephone Encounter (Signed)
Patient has an appointment scheduled with [facility] on March 20,2013 @ 9:45.

## 2011-08-13 NOTE — Assessment & Plan Note (Signed)
BMI 39. Patient eats fairly healthy diet. She is making effort to further improve her diet by limiting intake of processed carbohydrates. Encouraged her to continue with this. She is also planning to start an exercise program using tai chi. She will followup here in one month.

## 2011-08-13 NOTE — Progress Notes (Signed)
Subjective:    Patient ID: Janet Rice, female    DOB: 01-Jun-1946, 66 y.o.   MRN: [id number]  HPI The patient is here for annual Medicare wellness examination and management of other chronic and acute problems.   The risk factors are reflected in the social history.  The roster of all physicians providing medical care to patient - is listed in the Snapshot section of the chart.  Activities of daily living:  The patient is 100% independent in all ADLs: dressing, toileting, feeding as well as independent mobility  Home safety : The patient has smoke detectors in the home. They wear seatbelts.No firearms at home. There is no violence in the home.   There is no risks for hepatitis, STDs or HIV. There is no history of blood transfusion. They have no travel history to infectious disease endemic areas of the world.  The patient has seen their dentist in the last six month. They have seen their eye doctor in the last year. They admit to any hearing difficulty and have not had audiologic testing in the last year.  They do not  have excessive sun exposure. Discussed the need for sun protection: hats, long sleeves and use of sunscreen if there is significant sun exposure.   Diet: the importance of a healthy diet is discussed. They do try to have a healthy diet, reports eating home cooked, healthy meals 50% of the time.  The patient does not have a regular exercise program. She has plans to start Tai-Chi home video soon. The benefits of regular aerobic exercise were discussed.  Depression screen: there are no signs or vegative symptoms of depression- irritability, change in appetite, anhedonia, sadness/tearfullness.  Cognitive assessment: the patient manages all their financial and personal affairs and is actively engaged. They could relate day,date,year and events.  The following portions of the patient's history were reviewed and updated as appropriate: allergies, current medications, past  family history, past medical history,  past surgical history, past social history  and problem list.  Vision, hearing, body mass index were assessed and reviewed.   During the course of the visit the patient was educated and counseled about appropriate screening and preventive services including : fall prevention , diabetes screening, nutrition counseling, colorectal cancer screening, and recommended immunizations.   Patient has a history of iron deficiency anemia. She has been evaluated by GI medicine and had normal colonoscopy and upper endoscopy. She received iron infusions in the past. Recent hemoglobin was noted to be 8.3. This is down from 10.3 in October 2012. She notes that she had a recent viral infection with profuse diarrhea and vomiting. She did not notice any bright red blood in her emesis or stool, however questions whether this may have resulted in additional blood loss. She reports some fatigue during her viral infection, however reports this is improving. She denies any headache, chest pain, palpitations, or other symptoms. She denies any abdominal pain. She has not noticed any blood in her stool since the episode of gastroenteritis, nor has she had any black tarry stools.  Outpatient Encounter Prescriptions as of 08/13/2011  Medication Sig Dispense Refill  . aspirin 81 MG tablet Take 81 mg by mouth daily.        . Calcium-Vitamin D 600-125 MG-UNIT TABS Take 1 tablet by mouth daily.        . citalopram (CELEXA) 20 MG tablet TAKE ONE TABLET BY MOUTH EVERY DAY  90 tablet  4  . Multiple Vitamin (MULTIVITAMIN) tablet  Take 1 tablet by mouth daily.        Marland Kitchen omeprazole (PRILOSEC) 20 MG capsule Take 20 mg by mouth daily.        . pravastatin (PRAVACHOL) 40 MG tablet Take 1 tablet (40 mg total) by mouth daily.  90 tablet  3  . SYNTHROID 150 MCG tablet TAKE 1 TABLET BY MOUTH DAILY (DOSING CHANGE DUE TO LEVEL)  90 tablet  2    Review of Systems  Constitutional: Negative for fever, chills,  appetite change, fatigue and unexpected weight change.  HENT: Negative for ear pain, congestion, sore throat, trouble swallowing, neck pain, voice change and sinus pressure.   Eyes: Negative for visual disturbance.  Respiratory: Negative for cough, shortness of breath, wheezing and stridor.   Cardiovascular: Negative for chest pain, palpitations and leg swelling.  Gastrointestinal: Positive for nausea (2 weeks ago), vomiting (2 weeks ago) and diarrhea (2 weeks ago). Negative for abdominal pain, constipation, blood in stool, abdominal distention and anal bleeding.  Genitourinary: Negative for dysuria and flank pain.  Musculoskeletal: Negative for myalgias, arthralgias and gait problem.  Skin: Negative for color change and rash.  Neurological: Negative for dizziness and headaches.  Hematological: Negative for adenopathy. Does not bruise/bleed easily.  Psychiatric/Behavioral: Negative for suicidal ideas, sleep disturbance and dysphoric mood. The patient is not nervous/anxious.    BP 118/64  Pulse 92  Temp(Src) 98.6 F (37 C) (Oral)  Ht 5' 1.5" (1.562 m)  Wt 212 lb (96.163 kg)  BMI 39.41 kg/m2  SpO2 94%     Objective:   Physical Exam  Constitutional: She is oriented to person, place, and time. She appears well-developed and well-nourished. No distress.  HENT:  Head: Normocephalic and atraumatic.  Right Ear: External ear normal.  Left Ear: External ear normal.  Nose: Nose normal.  Mouth/Throat: Oropharynx is clear and moist. No oropharyngeal exudate.  Eyes: Conjunctivae are normal. Pupils are equal, round, and reactive to light. Right eye exhibits no discharge. Left eye exhibits no discharge. No scleral icterus.  Neck: Normal range of motion. Neck supple. No tracheal deviation present. No thyromegaly present.  Cardiovascular: Normal rate, regular rhythm, normal heart sounds and intact distal pulses.  Exam reveals no gallop and no friction rub.   No murmur heard. Pulmonary/Chest: Effort  normal and breath sounds normal. No respiratory distress. She has no wheezes. She has no rales. She exhibits no tenderness.  Abdominal: Soft. Bowel sounds are normal. She exhibits no distension and no mass. There is no tenderness. There is no rebound and no guarding.  Musculoskeletal: Normal range of motion. She exhibits no edema and no tenderness.  Lymphadenopathy:    She has no cervical adenopathy.  Neurological: She is alert and oriented to person, place, and time. No cranial nerve deficit. She exhibits normal muscle tone. Coordination normal.  Skin: Skin is warm and dry. No rash noted. She is not diaphoretic. No erythema. No pallor.  Psychiatric: She has a normal mood and affect. Her behavior is normal. Judgment and thought content normal.          Assessment & Plan:

## 2011-08-17 ENCOUNTER — Telehealth: Payer: Self-pay | Admitting: Internal Medicine

## 2011-08-17 NOTE — Telephone Encounter (Signed)
Informed Janet Rice Dr Rhea Belton stated it's ok to schedule her for a Direct EGD. Scheduled Janet Rice for 08/26/11 at 09:30am; Janet Rice and I went over the prep instructions over the phone and she will call if there are any questions when she receives them.

## 2011-08-17 NOTE — Telephone Encounter (Signed)
Agree with direct to EGD 1st, then can consider VCE thereafter Thanks

## 2011-08-17 NOTE — Telephone Encounter (Signed)
Pt saw Dr Rhea Belton on 06/07/11 and was to f/u on 3 months to reassess. Pt saw Dr Ronna Polio on 08/13/11 and her H&H was even lower. Can you read Dr Tilman Neat assessment and plan and decide if it's ok to cancel the OV on 08/23/11 and go ahead and schedule pt for a Direct EGD? Thanks.

## 2011-08-18 ENCOUNTER — Encounter: Payer: Self-pay | Admitting: Internal Medicine

## 2011-08-23 ENCOUNTER — Ambulatory Visit: Payer: Medicare Other | Admitting: Internal Medicine

## 2011-08-25 ENCOUNTER — Telehealth: Payer: Self-pay | Admitting: Internal Medicine

## 2011-08-25 ENCOUNTER — Encounter: Payer: Self-pay | Admitting: Internal Medicine

## 2011-08-25 ENCOUNTER — Ambulatory Visit (INDEPENDENT_AMBULATORY_CARE_PROVIDER_SITE_OTHER): Payer: Medicare Other | Admitting: Internal Medicine

## 2011-08-25 VITALS — BP 110/54 | HR 92 | Temp 98.2°F | Ht 61.5 in | Wt 208.0 lb

## 2011-08-25 DIAGNOSIS — J069 Acute upper respiratory infection, unspecified: Secondary | ICD-10-CM

## 2011-08-25 NOTE — Progress Notes (Signed)
Subjective:    Patient ID: Janet Rice, female    DOB: Jun 03, 1946, 66 y.o.   MRN: [id number]  HPI 66 year old female with history of iron deficiency anemia presents for acute visit complaining of several days of sore throat and nasal congestion. She reports that her symptoms began with sore throat. She denies any noted fever or chills. Over the last 24 hours, her sore throat has been persistent and she has developed clear nasal drainage. She has occasional cough. The cough is nonproductive. She denies any shortness of breath, chest pain. She notes that she is scheduled for upper endoscopy tomorrow and is concerned about having this procedure while ill.  Outpatient Encounter Prescriptions as of 08/25/2011  Medication Sig Dispense Refill  . aspirin 81 MG tablet Take 81 mg by mouth daily.        . Calcium-Vitamin D 600-125 MG-UNIT TABS Take 1 tablet by mouth daily.        . citalopram (CELEXA) 20 MG tablet TAKE ONE TABLET BY MOUTH EVERY DAY  90 tablet  4  . Multiple Vitamin (MULTIVITAMIN) tablet Take 1 tablet by mouth daily.        Marland Kitchen omeprazole (PRILOSEC) 20 MG capsule Take 20 mg by mouth daily.        . pravastatin (PRAVACHOL) 40 MG tablet Take 1 tablet (40 mg total) by mouth daily.  90 tablet  3  . SYNTHROID 150 MCG tablet TAKE 1 TABLET BY MOUTH DAILY (DOSING CHANGE DUE TO LEVEL)  90 tablet  2    Review of Systems  Constitutional: Negative for fever, chills and unexpected weight change.  HENT: Positive for sore throat, rhinorrhea and postnasal drip. Negative for hearing loss, ear pain, nosebleeds, congestion, facial swelling, sneezing, mouth sores, trouble swallowing, neck pain, neck stiffness, voice change, sinus pressure, tinnitus and ear discharge.   Eyes: Negative for pain, discharge, redness and visual disturbance.  Respiratory: Positive for cough. Negative for chest tightness, shortness of breath, wheezing and stridor.   Cardiovascular: Negative for chest pain, palpitations and leg  swelling.  Musculoskeletal: Negative for myalgias and arthralgias.  Skin: Negative for color change and rash.  Neurological: Negative for dizziness, weakness, light-headedness and headaches.  Hematological: Negative for adenopathy.   BP 110/54  Pulse 92  Temp(Src) 98.2 F (36.8 C) (Oral)  Ht 5' 1.5" (1.562 m)  Wt 208 lb (94.348 kg)  BMI 38.66 kg/m2  SpO2 96%     Objective:   Physical Exam  Constitutional: She is oriented to person, place, and time. She appears well-developed and well-nourished. No distress.  HENT:  Head: Normocephalic and atraumatic.  Right Ear: External ear normal.  Left Ear: External ear normal.  Nose: Nose normal.  Mouth/Throat: Oropharynx is clear and moist. No oropharyngeal exudate.  Eyes: Conjunctivae are normal. Pupils are equal, round, and reactive to light. Right eye exhibits no discharge. Left eye exhibits no discharge. No scleral icterus.  Neck: Normal range of motion. Neck supple. No tracheal deviation present. No thyromegaly present.  Cardiovascular: Normal rate, regular rhythm, normal heart sounds and intact distal pulses.  Exam reveals no gallop and no friction rub.   No murmur heard. Pulmonary/Chest: Effort normal and breath sounds normal. No respiratory distress. She has no wheezes. She has no rales. She exhibits no tenderness.  Musculoskeletal: Normal range of motion. She exhibits no edema and no tenderness.  Lymphadenopathy:    She has no cervical adenopathy.  Neurological: She is alert and oriented to person, place, and time. No  cranial nerve deficit. She exhibits normal muscle tone. Coordination normal.  Skin: Skin is warm and dry. No rash noted. She is not diaphoretic. No erythema. No pallor.  Psychiatric: She has a normal mood and affect. Her behavior is normal. Judgment and thought content normal.          Assessment & Plan:

## 2011-08-25 NOTE — Telephone Encounter (Signed)
No charge , thank you 

## 2011-08-25 NOTE — Assessment & Plan Note (Signed)
Symptoms and exam are consistent with viral upper respiratory infection. Encouraged patient to continue to use Tylenol or ibuprofen as needed for sore throat pain. She will use Sudafed as needed for congestion. She will call or return to clinic if symptoms are persistent or she develops recurrent fever. She will delay her scheduled upper endoscopy until she recovers from this illness.

## 2011-08-26 ENCOUNTER — Other Ambulatory Visit: Payer: Medicare Other | Admitting: Internal Medicine

## 2011-09-10 ENCOUNTER — Encounter: Payer: Self-pay | Admitting: Internal Medicine

## 2011-09-10 ENCOUNTER — Ambulatory Visit: Payer: Medicare Other | Admitting: Internal Medicine

## 2011-09-10 ENCOUNTER — Ambulatory Visit (INDEPENDENT_AMBULATORY_CARE_PROVIDER_SITE_OTHER): Payer: Medicare Other | Admitting: Internal Medicine

## 2011-09-10 VITALS — BP 122/60 | HR 93 | Temp 98.6°F | Ht 61.5 in | Wt 205.0 lb

## 2011-09-10 DIAGNOSIS — K047 Periapical abscess without sinus: Secondary | ICD-10-CM

## 2011-09-10 DIAGNOSIS — E785 Hyperlipidemia, unspecified: Secondary | ICD-10-CM

## 2011-09-10 DIAGNOSIS — D509 Iron deficiency anemia, unspecified: Secondary | ICD-10-CM

## 2011-09-10 LAB — COMPREHENSIVE METABOLIC PANEL
ALT: 27 U/L (ref 0–35)
AST: 25 U/L (ref 0–37)
Albumin: 3.9 g/dL (ref 3.5–5.2)
Alkaline Phosphatase: 122 U/L — ABNORMAL HIGH (ref 39–117)
BUN: 10 mg/dL (ref 6–23)
CO2: 29 mEq/L (ref 19–32)
Calcium: 9.1 mg/dL (ref 8.4–10.5)
Chloride: 101 mEq/L (ref 96–112)
Creatinine, Ser: 1.1 mg/dL (ref 0.4–1.2)
GFR: 52.36 mL/min — ABNORMAL LOW (ref >60.00)
Glucose, Bld: 94 mg/dL (ref 70–99)
Potassium: 4.7 mEq/L (ref 3.5–5.1)
Sodium: 137 mEq/L (ref 135–145)
Total Bilirubin: 0 mg/dL — ABNORMAL LOW (ref 0.3–1.2)
Total Protein: 7.2 g/dL (ref 6.0–8.3)

## 2011-09-10 LAB — CBC WITH DIFFERENTIAL/PLATELET
Basophils Absolute: 0 10*3/uL (ref 0.0–0.1)
Basophils Relative: 0.4 % (ref 0.0–3.0)
Eosinophils Absolute: 0.1 10*3/uL (ref 0.0–0.7)
Eosinophils Relative: 1.7 % (ref 0.0–5.0)
HCT: 28.9 % — ABNORMAL LOW (ref 36.0–46.0)
Hemoglobin: 9 g/dL — ABNORMAL LOW (ref 12.0–15.0)
Lymphocytes Relative: 26.3 % (ref 12.0–46.0)
Lymphs Abs: 1.6 10*3/uL (ref 0.7–4.0)
MCHC: 31.3 g/dL (ref 30.0–36.0)
MCV: 63.3 fl — ABNORMAL LOW (ref 78.0–100.0)
Monocytes Absolute: 0.4 10*3/uL (ref 0.1–1.0)
Monocytes Relative: 7.2 % (ref 3.0–12.0)
Neutro Abs: 3.9 10*3/uL (ref 1.4–7.7)
Neutrophils Relative %: 64.4 % (ref 43.0–77.0)
Platelets: 138 10*3/uL — ABNORMAL LOW (ref 150.0–400.0)
RBC: 4.56 Mil/uL (ref 3.87–5.11)
RDW: 17.8 % — ABNORMAL HIGH (ref 11.5–14.6)
WBC: 6 10*3/uL (ref 4.5–10.5)

## 2011-09-10 LAB — LIPID PANEL
Cholesterol: 163 mg/dL (ref 0–200)
HDL: 45.6 mg/dL (ref >39.00)
LDL Cholesterol: 91 mg/dL (ref 0–99)
Total CHOL/HDL Ratio: 4
Triglycerides: 131 mg/dL (ref 0.0–149.0)
VLDL: 26.2 mg/dL (ref 0.0–40.0)

## 2011-09-10 LAB — FERRITIN: Ferritin: 4.8 ng/mL — ABNORMAL LOW (ref 10.0–291.0)

## 2011-09-10 MED ORDER — AMOXICILLIN-POT CLAVULANATE 875-125 MG PO TABS: 1.0000 | ORAL_TABLET | Freq: Two times a day (BID) | ORAL | Status: AC

## 2011-09-10 NOTE — Assessment & Plan Note (Signed)
Will check CBC and ferritin with labs today. 

## 2011-09-10 NOTE — Assessment & Plan Note (Signed)
Exam is consistent with left frontal dental abscess. Patient has been taking amoxicillin for 2 days with minimal improvement. Will change to Augmentin. Patient will call or go to the ED if symptoms are worsening over the weekend. She will followup, otherwise, with her dentist on Monday.

## 2011-09-10 NOTE — Progress Notes (Signed)
Subjective:    Patient ID: Janet Rice, female    DOB: [birth year], 66 y.o.   MRN: [id number]  HPI 66 year old female with history of iron deficiency anemia presents for followup. She was recently seen for viral upper respiratory infection. She notes that her symptoms of coughing congestion have resolved. She is concerned today about a left-sided dental abscess. She notes that she has been seen by her dentist in plan is to resect her tooth. Her dentist started her on amoxicillin which she has been taking for the last 2 days. She reports minimal improvement with this. She denies any fever or chills. She notes swelling and pain over her left jaw. She has not had difficulty eating.  Outpatient Encounter Prescriptions as of 09/10/2011  Medication Sig Dispense Refill  . aspirin 81 MG tablet Take 81 mg by mouth daily.        . Calcium-Vitamin D 600-125 MG-UNIT TABS Take 1 tablet by mouth daily.        . citalopram (CELEXA) 20 MG tablet TAKE ONE TABLET BY MOUTH EVERY DAY  90 tablet  4  . Multiple Vitamin (MULTIVITAMIN) tablet Take 1 tablet by mouth daily.        Marland Kitchen omeprazole (PRILOSEC) 20 MG capsule Take 20 mg by mouth daily.        . pravastatin (PRAVACHOL) 40 MG tablet Take 1 tablet (40 mg total) by mouth daily.  90 tablet  3  . SYNTHROID 150 MCG tablet TAKE 1 TABLET BY MOUTH DAILY (DOSING CHANGE DUE TO LEVEL)  90 tablet  2  . amoxicillin-clavulanate (AUGMENTIN) 875-125 MG per tablet Take 1 tablet by mouth 2 (two) times daily.  20 tablet  0    Review of Systems  Constitutional: Negative for fever, chills, appetite change, fatigue and unexpected weight change.  HENT: Positive for dental problem. Negative for ear pain, congestion, sore throat, trouble swallowing, neck pain, voice change and sinus pressure.   Eyes: Negative for visual disturbance.  Respiratory: Negative for cough, shortness of breath, wheezing and stridor.   Cardiovascular: Negative for chest pain, palpitations and leg swelling.    Gastrointestinal: Negative for nausea, vomiting, abdominal pain, diarrhea, constipation, blood in stool, abdominal distention and anal bleeding.  Genitourinary: Negative for dysuria and flank pain.  Musculoskeletal: Positive for arthralgias. Negative for myalgias and gait problem.  Skin: Positive for color change. Negative for rash.  Neurological: Negative for dizziness and headaches.  Hematological: Negative for adenopathy. Does not bruise/bleed easily.  Psychiatric/Behavioral: Negative for suicidal ideas, sleep disturbance and dysphoric mood. The patient is not nervous/anxious.    BP 122/60  Pulse 93  Temp(Src) 98.6 F (37 C) (Oral)  Ht 5' 1.5" (1.562 m)  Wt 205 lb (92.987 kg)  BMI 38.11 kg/m2  SpO2 96%     Objective:   Physical Exam  Constitutional: She is oriented to person, place, and time. She appears well-developed and well-nourished. No distress.  HENT:  Head: Normocephalic and atraumatic.    Right Ear: External ear normal.  Left Ear: External ear normal.  Nose: Nose normal.  Mouth/Throat: Oropharynx is clear and moist. No oropharyngeal exudate.  Eyes: Conjunctivae are normal. Pupils are equal, round, and reactive to light. Right eye exhibits no discharge. Left eye exhibits no discharge. No scleral icterus.  Neck: Normal range of motion. Neck supple. No tracheal deviation present. No thyromegaly present.  Cardiovascular: Normal rate, regular rhythm, normal heart sounds and intact distal pulses.  Exam reveals no gallop and no friction rub.  No murmur heard. Pulmonary/Chest: Effort normal and breath sounds normal. No respiratory distress. She has no wheezes. She has no rales. She exhibits no tenderness.  Musculoskeletal: Normal range of motion. She exhibits no edema and no tenderness.  Lymphadenopathy:    She has no cervical adenopathy.  Neurological: She is alert and oriented to person, place, and time. No cranial nerve deficit. She exhibits normal muscle tone.  Coordination normal.  Skin: Skin is warm and dry. No rash noted. She is not diaphoretic. No erythema. No pallor.  Psychiatric: She has a normal mood and affect. Her behavior is normal. Judgment and thought content normal.          Assessment & Plan:

## 2011-09-15 ENCOUNTER — Telehealth: Payer: Self-pay | Admitting: *Deleted

## 2011-09-15 NOTE — Telephone Encounter (Signed)
I need to see this patient in clinic followup. Reason his iron deficiency anemia as recently documented by her primary care doctor, Dr. walker ----- Message ----- From: Shelia Media, MD Sent: 09/10/2011 4:38 PM To: Lamar Sprinkles, CMA, Erick Blinks, MD  Maralyn Sago - Please let Ms. Ress know that she has persistent iron deficiency anemia based on labs. We will contact Dr. Rhea Belton to reschedule endoscopy.     Pt was scheduled for EGD on 08/25/11 ut pt had a sore throat and cancelled after she conferred with Dr Ronna Polio; Dr Dan Humphreys suggested she wait 2 weeks and then r/s. Called pt who stated she's had a lot going on as well. She would like to schedule the week of 10/04/11 . The only appts open that week are Propofol; ok to schedule with Propofol? Thanks.

## 2011-09-16 NOTE — Telephone Encounter (Signed)
Pt scheduled for EGD on 09/28/11 at 0900am. She has not had PV teaching nor signed any forms. She agreed to come for a PV on 09/21/11 at 3:30pm.

## 2011-09-22 ENCOUNTER — Ambulatory Visit (AMBULATORY_SURGERY_CENTER): Payer: Medicare Other | Admitting: *Deleted

## 2011-09-28 ENCOUNTER — Other Ambulatory Visit: Payer: Self-pay | Admitting: Internal Medicine

## 2011-09-28 ENCOUNTER — Ambulatory Visit (AMBULATORY_SURGERY_CENTER): Payer: Medicare Other | Admitting: Internal Medicine

## 2011-09-28 ENCOUNTER — Encounter: Payer: Self-pay | Admitting: Internal Medicine

## 2011-09-28 VITALS — BP 134/68 | HR 100 | Temp 99.1°F | Resp 20 | Ht 61.0 in | Wt 206.0 lb

## 2011-09-28 DIAGNOSIS — D131 Benign neoplasm of stomach: Secondary | ICD-10-CM

## 2011-09-28 DIAGNOSIS — D509 Iron deficiency anemia, unspecified: Secondary | ICD-10-CM

## 2011-09-28 DIAGNOSIS — D1330 Benign neoplasm of unspecified part of small intestine: Secondary | ICD-10-CM | POA: Diagnosis not present

## 2011-09-28 MED ORDER — SODIUM CHLORIDE 0.9 % IV SOLN: 500.0000 mL | INTRAVENOUS | Status: DC

## 2011-09-28 NOTE — Patient Instructions (Signed)
Discharge instructions given with verbal understanding. Handout on a hiatal hernia given. Resume previous medications. YOU HAD AN ENDOSCOPIC PROCEDURE TODAY AT THE [facility]: Refer to the procedure report that was given to you for any specific questions about what was found during the examination.  If the procedure report does not answer your questions, please call your gastroenterologist to clarify.  If you requested that your care partner not be given the details of your procedure findings, then the procedure report has been included in a sealed envelope for you to review at your convenience later.  YOU SHOULD EXPECT: Some feelings of bloating in the abdomen. Passage of more gas than usual.  Walking can help get rid of the air that was put into your GI tract during the procedure and reduce the bloating. If you had a lower endoscopy (such as a colonoscopy or flexible sigmoidoscopy) you may notice spotting of blood in your stool or on the toilet paper. If you underwent a bowel prep for your procedure, then you may not have a normal bowel movement for a few days.  DIET: Your first meal following the procedure should be a light meal and then it is ok to progress to your normal diet.  A half-sandwich or bowl of soup is an example of a good first meal.  Heavy or fried foods are harder to digest and may make you feel nauseous or bloated.  Likewise meals heavy in dairy and vegetables can cause extra gas to form and this can also increase the bloating.  Drink plenty of fluids but you should avoid alcoholic beverages for 24 hours.  ACTIVITY: Your care partner should take you home directly after the procedure.  You should plan to take it easy, moving slowly for the rest of the day.  You can resume normal activity the day after the procedure however you should NOT DRIVE or use heavy machinery for 24 hours (because of the sedation medicines used during the test).    SYMPTOMS TO REPORT IMMEDIATELY: A  gastroenterologist can be reached at any hour.  During normal business hours, 8:30 AM to 5:00 PM Monday through Friday, call [phone].  After hours and on weekends, please call the GI answering service at [phone] who will take a message and have the physician on call contact you.   Following upper endoscopy (EGD)  Vomiting of blood or coffee ground material  New chest pain or pain under the shoulder blades  Painful or persistently difficult swallowing  New shortness of breath  Fever of 100F or higher  Black, tarry-looking stools  FOLLOW UP: If any biopsies were taken you will be contacted by phone or by letter within the next 1-3 weeks.  Call your gastroenterologist if you have not heard about the biopsies in 3 weeks.  Our staff will call the home number listed on your records the next business day following your procedure to check on you and address any questions or concerns that you may have at that time regarding the information given to you following your procedure. This is a courtesy call and so if there is no answer at the home number and we have not heard from you through the emergency physician on call, we will assume that you have returned to your regular daily activities without incident.  SIGNATURES/CONFIDENTIALITY: You and/or your care partner have signed paperwork which will be entered into your electronic medical record.  These signatures attest to the fact that that the information   above on your After Visit Summary has been reviewed and is understood.  Full responsibility of the confidentiality of this discharge information lies with you and/or your care-partner.  

## 2011-09-28 NOTE — Progress Notes (Signed)
Patient did not experience any of the following events: a burn prior to discharge; a fall within the facility; wrong site/side/patient/procedure/implant event; or a hospital transfer or hospital admission upon discharge from the facility. (G8907) Patient did not have preoperative order for IV antibiotic SSI prophylaxis. (G8918)  

## 2011-09-28 NOTE — Op Note (Signed)
[facility] [address] [place], [place]  [address]  ENDOSCOPY PROCEDURE REPORT  PATIENT:  Janet Rice, Janet Rice  MR#:  [id number] BIRTHDATE:  Feb 08, 1946, 65 yrs. old  GENDER:  female ENDOSCOPIST:  Carie Caddy. Brooklin Rieger, MD Referred by:  Ronna Polio, MD PROCEDURE DATE:  09/28/2011 PROCEDURE:  EGD with biopsy, 19147 ASA CLASS:  Class II INDICATIONS:  iron deficiency anemia MEDICATIONS:    These medications were titrated to patient response per physician's verbal order, Versed 6 mg IV, Fentanyl 50 mcg IV TOPICAL ANESTHETIC:  Cetacaine Spray  DESCRIPTION OF PROCEDURE:   After the risks benefits and alternatives of the procedure were thoroughly explained, informed consent was obtained.  The [facility] [id number] endoscope was introduced through the mouth and advanced to the second portion of the duodenum, without limitations.  The instrument was slowly withdrawn as the mucosa was fully examined. <<PROCEDUREIMAGES>>  The esophagus and gastroesophageal junction were completely normal in appearance.  A large, approximately 6 cm, hiatal hernia was found.  There were multiple polyps measuring 2 - 8 mm identified. in the cardia, fundus and gastric body. Multiple biopsies were obtained for sampling and sent to pathology.  Otherwise normal stomach.  The duodenal bulb was normal in appearance, as was the postbulbar duodenum. Multiple biopsies were obtained and sent to pathology to rule out celiac disease.    Retroflexed views revealed findings as previously described.    The scope was then withdrawn from the patient and the procedure completed.  COMPLICATIONS:  None  ENDOSCOPIC IMPRESSION: 1) Normal esophagus 2) 6 cm Hiatal hernia 3) Polyps, multiple in the cardia, fundus and gastric body. Likely benign.  Biopsies taken for sampling and sent to pathology.  4) Otherwise normal stomach 5) Normal duodenum.  Biopsies taken.  RECOMMENDATIONS: 1) Await pathology results 2) Avoid  NSAIDs 3) Continue current medications 4) Follow-up of helicobacter pylori status, treat if indicated 5) If pathology unremarkable, will need video capsule endoscopy (indication: iron deficiency)  Carie Caddy. Rhea Belton, MD  CC:  The Patient Ronna Polio MD  n. eSIGNEDCarie Caddy. Blaise Palladino at 09/28/2011 09:13 AM  Armandina Stammer, [phone]

## 2011-09-29 ENCOUNTER — Telehealth: Payer: Self-pay | Admitting: *Deleted

## 2011-09-29 NOTE — Telephone Encounter (Signed)
  Follow up Call-  Call back number 09/28/2011  Post procedure Call Back phone  # [phone]  Permission to leave phone message Yes     Patient questions:  Do you have a fever, pain , or abdominal swelling? no Pain Score  0 *  Have you tolerated food without any problems? yes  Have you been able to return to your normal activities? yes  Do you have any questions about your discharge instructions: Diet   no Medications  no Follow up visit  no  Do you have questions or concerns about your Care? no  Actions: * If pain score is 4 or above: No action needed, pain <4.

## 2011-10-05 ENCOUNTER — Other Ambulatory Visit: Payer: Self-pay | Admitting: Internal Medicine

## 2011-10-05 ENCOUNTER — Encounter: Payer: Self-pay | Admitting: Internal Medicine

## 2011-10-05 DIAGNOSIS — D509 Iron deficiency anemia, unspecified: Secondary | ICD-10-CM

## 2011-10-06 ENCOUNTER — Telehealth: Payer: Self-pay | Admitting: *Deleted

## 2011-10-06 NOTE — Telephone Encounter (Signed)
Letter from: Janet Rice   Reason for Letter: Results Review   Comments: needs video capsule study, iron def   Janet Rice, pls arrange VCE, iron def anemia.      Spoke with pt to inform her she needs a capsule endo per Dr Rhea Belton. Pt stated understanding; teaching scheduled for 10/18/11 at 2pm.

## 2011-10-18 ENCOUNTER — Telehealth: Payer: Self-pay | Admitting: *Deleted

## 2011-10-18 DIAGNOSIS — K6381 Dieulafoy lesion of intestine: Secondary | ICD-10-CM

## 2011-10-18 NOTE — Telephone Encounter (Signed)
Patient came to office for capsule endoscopy teaching. Patient given verbal and written instructions. She verbalized understanding. Scheduled for procedure on 10/27/11.

## 2011-10-19 ENCOUNTER — Telehealth: Payer: Self-pay | Admitting: Internal Medicine

## 2011-10-19 NOTE — Telephone Encounter (Signed)
Patient calling to r/s capsule endoscopy. Rescheduled to 11/16/11 per her request.

## 2011-10-20 DIAGNOSIS — Z79899 Other long term (current) drug therapy: Secondary | ICD-10-CM | POA: Diagnosis not present

## 2011-10-20 DIAGNOSIS — Z85820 Personal history of malignant melanoma of skin: Secondary | ICD-10-CM | POA: Diagnosis not present

## 2011-10-26 ENCOUNTER — Ambulatory Visit: Payer: Self-pay | Admitting: Oncology

## 2011-10-26 DIAGNOSIS — E039 Hypothyroidism, unspecified: Secondary | ICD-10-CM | POA: Diagnosis not present

## 2011-10-26 DIAGNOSIS — Z5181 Encounter for therapeutic drug level monitoring: Secondary | ICD-10-CM | POA: Diagnosis not present

## 2011-10-26 DIAGNOSIS — D509 Iron deficiency anemia, unspecified: Secondary | ICD-10-CM | POA: Diagnosis not present

## 2011-10-26 DIAGNOSIS — Z79899 Other long term (current) drug therapy: Secondary | ICD-10-CM | POA: Diagnosis not present

## 2011-10-26 DIAGNOSIS — Z85820 Personal history of malignant melanoma of skin: Secondary | ICD-10-CM | POA: Diagnosis not present

## 2011-11-09 DIAGNOSIS — Z5181 Encounter for therapeutic drug level monitoring: Secondary | ICD-10-CM | POA: Diagnosis not present

## 2011-11-09 DIAGNOSIS — E039 Hypothyroidism, unspecified: Secondary | ICD-10-CM | POA: Diagnosis not present

## 2011-11-09 DIAGNOSIS — Z79899 Other long term (current) drug therapy: Secondary | ICD-10-CM | POA: Diagnosis not present

## 2011-11-09 DIAGNOSIS — Z85820 Personal history of malignant melanoma of skin: Secondary | ICD-10-CM | POA: Diagnosis not present

## 2011-11-09 DIAGNOSIS — D509 Iron deficiency anemia, unspecified: Secondary | ICD-10-CM | POA: Diagnosis not present

## 2011-11-10 ENCOUNTER — Ambulatory Visit: Payer: Self-pay | Admitting: Oncology

## 2011-11-10 ENCOUNTER — Encounter: Payer: Self-pay | Admitting: Internal Medicine

## 2011-11-10 ENCOUNTER — Ambulatory Visit (INDEPENDENT_AMBULATORY_CARE_PROVIDER_SITE_OTHER): Payer: Medicare Other | Admitting: Internal Medicine

## 2011-11-10 VITALS — BP 92/58 | HR 64 | Temp 98.4°F | Resp 14 | Wt 199.5 lb

## 2011-11-10 DIAGNOSIS — D509 Iron deficiency anemia, unspecified: Secondary | ICD-10-CM

## 2011-11-10 DIAGNOSIS — Z79899 Other long term (current) drug therapy: Secondary | ICD-10-CM | POA: Diagnosis not present

## 2011-11-10 DIAGNOSIS — H748X9 Other specified disorders of middle ear and mastoid, unspecified ear: Secondary | ICD-10-CM | POA: Diagnosis not present

## 2011-11-10 DIAGNOSIS — H6591 Unspecified nonsuppurative otitis media, right ear: Secondary | ICD-10-CM | POA: Insufficient documentation

## 2011-11-10 NOTE — Progress Notes (Signed)
Subjective:    Patient ID: Janet Rice, female    DOB: 19-Jun-1946, 66 y.o.   MRN: [id number]  HPI 66 year old female with history of iron deficiency anemia, hyperlipidemia, and recent episode of right-sided dental abscess presents for followup. Her primary concern today is persistent sensation of "whooshing "sound in her right ear. This is noted most often when she is lying down. She denies ringing in her ear or loss of hearing. She denies ear pain. She denies fever or chills. She denies nasal congestion. She is otherwise feeling well.  Regards to her iron deficiency anemia, she notes that she has undergone upper and lower endoscopy which were unremarkable. She is scheduled for capsule endoscopy this week. She was seen by her oncologist and has had iron infusions. She is scheduled for followup with her oncologist this month.  Outpatient Encounter Prescriptions as of 11/10/2011  Medication Sig Dispense Refill  . Calcium-Vitamin D 600-125 MG-UNIT TABS Take 1 tablet by mouth daily.        . citalopram (CELEXA) 20 MG tablet TAKE ONE TABLET BY MOUTH EVERY DAY  90 tablet  4  . Multiple Vitamin (MULTIVITAMIN) tablet Take 1 tablet by mouth daily.        Marland Kitchen omeprazole (PRILOSEC) 20 MG capsule TAKE 1 CAPSULE BY MOUTH DAILY  90 capsule  3  . pravastatin (PRAVACHOL) 40 MG tablet Take 1 tablet (40 mg total) by mouth daily.  90 tablet  3  . SYNTHROID 150 MCG tablet TAKE 1 TABLET BY MOUTH DAILY (DOSING CHANGE DUE TO LEVEL)  90 tablet  2  . DISCONTD: aspirin 81 MG tablet Take 81 mg by mouth daily.          Review of Systems  Constitutional: Negative for fever, chills, appetite change, fatigue and unexpected weight change.  HENT: Negative for ear pain, congestion, sore throat, trouble swallowing, neck pain, voice change and sinus pressure.   Eyes: Negative for visual disturbance.  Respiratory: Negative for cough, shortness of breath, wheezing and stridor.   Cardiovascular: Negative for chest pain,  palpitations and leg swelling.  Gastrointestinal: Negative for nausea, vomiting, abdominal pain, diarrhea, constipation, blood in stool, abdominal distention and anal bleeding.  Genitourinary: Negative for dysuria and flank pain.  Musculoskeletal: Negative for myalgias, arthralgias and gait problem.  Skin: Negative for color change and rash.  Neurological: Negative for dizziness and headaches.  Hematological: Negative for adenopathy. Does not bruise/bleed easily.  Psychiatric/Behavioral: Negative for suicidal ideas, sleep disturbance and dysphoric mood. The patient is not nervous/anxious.    BP 92/58  Pulse 64  Temp(Src) 98.4 F (36.9 C) (Oral)  Resp 14  Wt 199 lb 8 oz (90.493 kg)     Objective:   Physical Exam  Constitutional: She is oriented to person, place, and time. She appears well-developed and well-nourished. No distress.  HENT:  Head: Normocephalic and atraumatic.  Right Ear: External ear normal. Tympanic membrane is bulging. A middle ear effusion is present.  Left Ear: External ear normal. Tympanic membrane is bulging. A middle ear effusion is present.  Nose: Nose normal.  Mouth/Throat: Oropharynx is clear and moist. No oropharyngeal exudate.  Eyes: Conjunctivae are normal. Pupils are equal, round, and reactive to light. Right eye exhibits no discharge. Left eye exhibits no discharge. No scleral icterus.  Neck: Normal range of motion. Neck supple. No tracheal deviation present. No thyromegaly present.  Cardiovascular: Normal rate, regular rhythm, normal heart sounds and intact distal pulses.  Exam reveals no gallop and no friction  rub.   No murmur heard. Pulmonary/Chest: Effort normal and breath sounds normal. No respiratory distress. She has no wheezes. She has no rales. She exhibits no tenderness.  Musculoskeletal: Normal range of motion. She exhibits no edema and no tenderness.  Lymphadenopathy:    She has no cervical adenopathy.  Neurological: She is alert and oriented  to person, place, and time. No cranial nerve deficit. She exhibits normal muscle tone. Coordination normal.  Skin: Skin is warm and dry. No rash noted. She is not diaphoretic. No erythema. No pallor.  Psychiatric: She has a normal mood and affect. Her behavior is normal. Judgment and thought content normal.          Assessment & Plan:

## 2011-11-10 NOTE — Assessment & Plan Note (Signed)
Status post upper and lower endoscopy which were normal. Plan for capsule endoscopy this week. Undergoing iron infusions with her oncologist. She has followup labs in their office this month.

## 2011-11-10 NOTE — Assessment & Plan Note (Signed)
Middle ear effusion noted on the right. Suspect this is causing symptoms of "whooshing" sound. Will try using nonsedating antihistamine to see if any improvement. We'll also add Sudafed. If no improvement over the next couple of days, we discussed starting prednisone and is sitting up referral to ENT.

## 2011-11-16 ENCOUNTER — Ambulatory Visit (INDEPENDENT_AMBULATORY_CARE_PROVIDER_SITE_OTHER): Payer: Medicare Other | Admitting: Internal Medicine

## 2011-11-16 DIAGNOSIS — K5521 Angiodysplasia of colon with hemorrhage: Secondary | ICD-10-CM | POA: Diagnosis not present

## 2011-11-16 NOTE — Progress Notes (Signed)
Patient here for capsule endoscopy for Dr. Rhea Belton. Patient verbalized understanding of all verbal and written instructions. Patient has been NPO and did the prep required for the procedure. Patient swallowed the capsule without difficulty. Lot [id number].

## 2011-11-23 ENCOUNTER — Telehealth: Payer: Self-pay | Admitting: *Deleted

## 2011-11-23 NOTE — Telephone Encounter (Signed)
Scheduled pt to see Dr Rhea Belton on 11/29/11.

## 2011-11-23 NOTE — Telephone Encounter (Signed)
Lm for pt to call back with female who answered.

## 2011-11-25 ENCOUNTER — Encounter: Payer: Self-pay | Admitting: Internal Medicine

## 2011-11-29 ENCOUNTER — Ambulatory Visit (INDEPENDENT_AMBULATORY_CARE_PROVIDER_SITE_OTHER): Payer: Medicare Other | Admitting: Internal Medicine

## 2011-11-29 ENCOUNTER — Encounter: Payer: Self-pay | Admitting: Internal Medicine

## 2011-11-29 VITALS — BP 110/62 | HR 60 | Ht 61.5 in | Wt 197.6 lb

## 2011-11-29 DIAGNOSIS — D509 Iron deficiency anemia, unspecified: Secondary | ICD-10-CM | POA: Diagnosis not present

## 2011-11-29 DIAGNOSIS — Z860100 Personal history of colon polyps, unspecified: Secondary | ICD-10-CM

## 2011-11-29 DIAGNOSIS — E611 Iron deficiency: Secondary | ICD-10-CM

## 2011-11-29 NOTE — Progress Notes (Addendum)
Subjective:    Patient ID: Janet Rice, female    DOB: 1946/04/20, 66 y.o.   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 She is alone today and doing well. She has history of iron deficiency treated over a year ago with IV iron infusion, and then recently was found by Dr. Dan Rice to have persistent iron deficiency. She is currently undergoing IV iron infusion under the direction of her hematologist in [place], [place]. She is due for her fourth and final infusion of this round of IV iron tomorrow. She continues to deny complaint. She's had no obvious bleeding, including no bright red blood per rectum or melena. She denies abdominal pain. No nausea or vomiting. No trouble swallowing. No change in her bowel habits, diarrhea or constipation. No fevers or chills.  Janet Rice had upper endoscopy on 09/28/2011, which revealed a 6 cm how hernia, multiple polyps in the gastric cardia and fundus which were sampled and revealed a fundic gland polyps. Her stomach was otherwise normal as was her duodenum. Duodenal biopsies were negative for celiac disease and inflammation. She then underwent video capsule endoscopy in the last several weeks, which was negative with no active bleeding and no source of bleeding identified.  Review of Systems As per history of present illness, otherwise negative  Current Medications, Allergies, Past Medical History, Past Surgical History, Family History and Social History were reviewed in [employer] record.     Objective:   Physical Exam BP 110/62  Pulse 60  Ht 5' 1.5" (1.562 m)  Wt 197 lb 9.6 oz (89.631 kg)  BMI 36.73 kg/m2 Constitutional: Well-developed and well-nourished. No distress. HEENT:  Normocephalic and atraumatic. Oropharynx is clear and moist. No oropharyngeal exudate. Conjunctivae are normal. Pupils are equal round and reactive to light. No scleral icterus. Neck: Neck supple. Trachea midline. Cardiovascular: Normal rate, regular rhythm and intact distal pulses. No M/R/G Pulmonary/chest: Effort normal and breath sounds normal. No wheezing, rales or rhonchi. Abdominal: Soft, nontender, nondistended. Bowel sounds active throughout. There are no masses palpable. No hepatosplenomegaly. Extremities: no clubbing, cyanosis, or edema Lymphadenopathy: No cervical adenopathy noted. Neurological: Alert and oriented to person place and time. Skin: Skin is warm and dry. No rashes noted. Psychiatric: Normal mood and affect. Behavior is normal.  CBC    Component Value Date/Time   WBC 6.0 09/10/2011 1206   WBC 5.8 08/10/2011 1021   RBC 4.56 09/10/2011 1206   RBC 4.23 08/10/2011 1021   HGB 9.0* 09/10/2011 1206   HCT 28.9* 09/10/2011 1206   PLT 138.0* 09/10/2011 1206   MCV 63.3* 09/10/2011 1206   MCH 19.6* 08/10/2011 1021   MCHC 31.3 09/10/2011 1206   MCHC 29.7* 08/10/2011 1021   RDW 17.8* 09/10/2011 1206   RDW 15.9* 08/10/2011 1021   LYMPHSABS 1.6 09/10/2011 1206   LYMPHSABS 1.7 08/10/2011 1021   MONOABS 0.4 09/10/2011 1206   EOSABS 0.1 09/10/2011 1206   EOSABS 0.1 08/10/2011 1021   BASOSABS 0.0 09/10/2011 1206   BASOSABS 0.0 08/10/2011 1021    Iron/TIBC/Ferritin    Component Value Date/Time   FERRITIN 4.8* 09/10/2011 1206      Assessment & Plan:  Janet Rice Janet Rice) is a 66 yo female with PMH of metastatic melanoma (though currently be in remission), hypertension, hyperlipidemia,  hypothyroidism, GERD, and history of colon polyps who is seen in follow-up after recent evaluation of iron deficiency anemia.  1. Iron def anemia -- the patient remains asymptomatic from a GI standpoint, and her upper endoscopy and video capsule endoscopy were negative for an etiology of her iron deficiency. She is also  up-to-date on her colorectal cancer screening, and underwent colonoscopy in [place] in March 2011 which revealed 2, less than 1 cm polyps and internal hemorrhoids. She does have a history of adenomatous colon polyps without high-grade dysplasia or malignancy. We have discussed our current options for further investigation of her iron deficiency anemia. She's not having any overt bleeding. We have discussed repeating the colonoscopy, earlier than when she is technically due for surveillance. She has not adamantly opposed to this, but asks for other options. We have discussed FOBT, with the knowledge that if this is positive, my recommendation would be colonoscopy. For now she is decide on this option, which I think is reasonable. I have ordered FOBT test. If these are negative, then I recommend repeating the colonoscopy in March 2014, which is the recommendation made by her previous GI physician who performed her last colonoscopy. She understands and is agreeable with this plan.  Addendum: FOBT testing perfomed and negative, which is reassuring. I recommend repeating CBC and iron studies to ensure improvement and normalization of iron stores. She is due for repeat colonoscopy in March 2014, and will plan colonoscopy for polyp surveillance at that time. If overt rebleeding (melena, hematochezia, BRBPR) occurs, she can be seen urgently.  Copy to Dr. Dan Rice, her PCP. I

## 2011-11-29 NOTE — Patient Instructions (Signed)
You have been given Hemoccult cards. Once [employer]. Results them please have them fax them back to our office at [phone]  Dr. Rhea Belton recommends you get a colonoscopy on 2014; our office will notify you with a reminder.

## 2011-11-30 ENCOUNTER — Other Ambulatory Visit: Payer: Self-pay | Admitting: Internal Medicine

## 2011-12-01 ENCOUNTER — Encounter: Payer: Self-pay | Admitting: Internal Medicine

## 2011-12-03 DIAGNOSIS — Z85828 Personal history of other malignant neoplasm of skin: Secondary | ICD-10-CM | POA: Diagnosis not present

## 2011-12-03 DIAGNOSIS — D485 Neoplasm of uncertain behavior of skin: Secondary | ICD-10-CM | POA: Diagnosis not present

## 2011-12-03 DIAGNOSIS — L570 Actinic keratosis: Secondary | ICD-10-CM | POA: Diagnosis not present

## 2011-12-03 DIAGNOSIS — C44519 Basal cell carcinoma of skin of other part of trunk: Secondary | ICD-10-CM | POA: Diagnosis not present

## 2011-12-03 DIAGNOSIS — D235 Other benign neoplasm of skin of trunk: Secondary | ICD-10-CM | POA: Diagnosis not present

## 2011-12-07 ENCOUNTER — Other Ambulatory Visit (INDEPENDENT_AMBULATORY_CARE_PROVIDER_SITE_OTHER): Payer: Medicare Other | Admitting: *Deleted

## 2011-12-07 DIAGNOSIS — N390 Urinary tract infection, site not specified: Secondary | ICD-10-CM

## 2011-12-07 LAB — POCT URINALYSIS DIPSTICK
Bilirubin, UA: NEGATIVE
Glucose, UA: NEGATIVE
Ketones, UA: NEGATIVE
Nitrite, UA: NEGATIVE
Protein, UA: 100
Spec Grav, UA: 1.01
Urobilinogen, UA: 0.2
pH, UA: 5

## 2011-12-08 ENCOUNTER — Telehealth: Payer: Self-pay | Admitting: Internal Medicine

## 2011-12-08 ENCOUNTER — Other Ambulatory Visit: Payer: Self-pay | Admitting: *Deleted

## 2011-12-08 MED ORDER — CIPROFLOXACIN HCL 500 MG PO TABS: 500.0000 mg | ORAL_TABLET | Freq: Two times a day (BID) | ORAL | Status: AC

## 2011-12-10 LAB — URINE CULTURE

## 2011-12-11 ENCOUNTER — Ambulatory Visit: Payer: Self-pay | Admitting: Oncology

## 2011-12-11 DIAGNOSIS — Z85820 Personal history of malignant melanoma of skin: Secondary | ICD-10-CM | POA: Diagnosis not present

## 2011-12-11 DIAGNOSIS — Z5181 Encounter for therapeutic drug level monitoring: Secondary | ICD-10-CM | POA: Diagnosis not present

## 2011-12-11 DIAGNOSIS — R5381 Other malaise: Secondary | ICD-10-CM | POA: Diagnosis not present

## 2011-12-11 DIAGNOSIS — E039 Hypothyroidism, unspecified: Secondary | ICD-10-CM | POA: Diagnosis not present

## 2011-12-11 DIAGNOSIS — D509 Iron deficiency anemia, unspecified: Secondary | ICD-10-CM | POA: Diagnosis not present

## 2011-12-11 DIAGNOSIS — Z79899 Other long term (current) drug therapy: Secondary | ICD-10-CM | POA: Diagnosis not present

## 2011-12-13 ENCOUNTER — Other Ambulatory Visit: Payer: Self-pay | Admitting: Gastroenterology

## 2011-12-13 DIAGNOSIS — Z86010 Personal history of colonic polyps: Secondary | ICD-10-CM

## 2011-12-13 DIAGNOSIS — D509 Iron deficiency anemia, unspecified: Secondary | ICD-10-CM

## 2011-12-13 NOTE — Telephone Encounter (Signed)
CMA will fx order

## 2011-12-22 DIAGNOSIS — C779 Secondary and unspecified malignant neoplasm of lymph node, unspecified: Secondary | ICD-10-CM | POA: Diagnosis not present

## 2011-12-22 DIAGNOSIS — C439 Malignant melanoma of skin, unspecified: Secondary | ICD-10-CM | POA: Diagnosis not present

## 2011-12-23 DIAGNOSIS — C44519 Basal cell carcinoma of skin of other part of trunk: Secondary | ICD-10-CM | POA: Diagnosis not present

## 2011-12-27 DIAGNOSIS — D509 Iron deficiency anemia, unspecified: Secondary | ICD-10-CM | POA: Diagnosis not present

## 2011-12-27 DIAGNOSIS — R5381 Other malaise: Secondary | ICD-10-CM | POA: Diagnosis not present

## 2011-12-27 DIAGNOSIS — R5383 Other fatigue: Secondary | ICD-10-CM | POA: Diagnosis not present

## 2011-12-27 DIAGNOSIS — Z85820 Personal history of malignant melanoma of skin: Secondary | ICD-10-CM | POA: Diagnosis not present

## 2012-01-10 ENCOUNTER — Ambulatory Visit: Payer: Self-pay | Admitting: Oncology

## 2012-01-24 ENCOUNTER — Other Ambulatory Visit: Payer: Self-pay | Admitting: Internal Medicine

## 2012-02-07 ENCOUNTER — Telehealth: Payer: Self-pay | Admitting: Internal Medicine

## 2012-02-07 NOTE — Telephone Encounter (Signed)
Patient would like order for labs to be done at [facility] on [address].  Please advise.

## 2012-02-07 NOTE — Telephone Encounter (Signed)
Lab order faxed to [facility] on [address], patient advised via message left on cell phone voicemail.

## 2012-02-07 NOTE — Telephone Encounter (Signed)
Patient wants Labs sent to [facility] Janet Rice before her appointment on August 1,2013 Thurs at 9:45.

## 2012-02-07 NOTE — Telephone Encounter (Signed)
Fine to order CBC, CMP, TSH, A1c, and lipids from [facility]

## 2012-02-08 ENCOUNTER — Other Ambulatory Visit: Payer: Self-pay | Admitting: Internal Medicine

## 2012-02-08 DIAGNOSIS — E785 Hyperlipidemia, unspecified: Secondary | ICD-10-CM | POA: Diagnosis not present

## 2012-02-09 LAB — CBC WITH DIFFERENTIAL
Basophils Absolute: 0 10*3/uL (ref 0.0–0.2)
Basos: 0 % (ref 0–3)
Eos: 3 % (ref 0–7)
Eosinophils Absolute: 0.2 10*3/uL (ref 0.0–0.4)
HCT: 38 % (ref 34.0–46.6)
Hemoglobin: 12.5 g/dL (ref 11.1–15.9)
Immature Grans (Abs): 0 10*3/uL (ref 0.0–0.1)
Immature Granulocytes: 0 % (ref 0–2)
Lymphocytes Absolute: 2 10*3/uL (ref 0.7–4.5)
Lymphs: 28 % (ref 14–46)
MCH: 23.7 pg — ABNORMAL LOW (ref 26.6–33.0)
MCHC: 32.9 g/dL (ref 31.5–35.7)
MCV: 72 fL — ABNORMAL LOW (ref 79–97)
Monocytes Absolute: 0.4 10*3/uL (ref 0.1–1.0)
Monocytes: 6 % (ref 4–13)
Neutrophils Absolute: 4.6 10*3/uL (ref 1.8–7.8)
Neutrophils Relative %: 63 % (ref 40–74)
Platelets: 134 10*3/uL — ABNORMAL LOW (ref 140–415)
RBC: 5.28 x10E6/uL (ref 3.77–5.28)
RDW: 19.5 % — ABNORMAL HIGH (ref 12.3–15.4)
WBC: 7.2 10*3/uL (ref 4.0–10.5)

## 2012-02-09 LAB — COMPREHENSIVE METABOLIC PANEL
ALT: 29 IU/L (ref 0–32)
AST: 17 IU/L (ref 0–40)
Albumin/Globulin Ratio: 1.4 (ref 1.1–2.5)
Albumin: 4.1 g/dL (ref 3.6–4.8)
Alkaline Phosphatase: 157 IU/L — ABNORMAL HIGH (ref 47–112)
BUN/Creatinine Ratio: 13 (ref 11–26)
BUN: 12 mg/dL (ref 8–27)
CO2: 27 mmol/L (ref 19–28)
Calcium: 9.5 mg/dL (ref 8.6–10.2)
Chloride: 104 mmol/L (ref 97–108)
Creatinine, Ser: 0.93 mg/dL (ref 0.57–1.00)
GFR calc Af Amer: 75 mL/min/{1.73_m2} (ref >59)
GFR calc non Af Amer: 65 mL/min/{1.73_m2} (ref >59)
Globulin, Total: 3 g/dL (ref 1.5–4.5)
Glucose: 92 mg/dL (ref 65–99)
Potassium: 4.8 mmol/L (ref 3.5–5.2)
Sodium: 142 mmol/L (ref 134–144)
Total Bilirubin: 0.4 mg/dL (ref 0.0–1.2)
Total Protein: 7.1 g/dL (ref 6.0–8.5)

## 2012-02-09 LAB — TSH: TSH: 0.009 u[IU]/mL — ABNORMAL LOW (ref 0.450–4.500)

## 2012-02-09 LAB — LIPID PANEL W/O CHOL/HDL RATIO
Cholesterol, Total: 192 mg/dL (ref 100–199)
HDL: 48 mg/dL (ref >39)
LDL Calculated: 118 mg/dL — ABNORMAL HIGH (ref 0–99)
Triglycerides: 128 mg/dL (ref 0–149)
VLDL Cholesterol Cal: 26 mg/dL (ref 5–40)

## 2012-02-09 LAB — HGB A1C W/O EAG: Hgb A1c MFr Bld: 5.9 % — ABNORMAL HIGH (ref 4.8–5.6)

## 2012-02-10 ENCOUNTER — Encounter: Payer: Self-pay | Admitting: Internal Medicine

## 2012-02-10 ENCOUNTER — Telehealth: Payer: Self-pay | Admitting: Internal Medicine

## 2012-02-10 ENCOUNTER — Ambulatory Visit (INDEPENDENT_AMBULATORY_CARE_PROVIDER_SITE_OTHER): Payer: Medicare Other | Admitting: Internal Medicine

## 2012-02-10 VITALS — BP 128/60 | HR 53 | Temp 98.3°F | Ht 61.5 in | Wt 188.5 lb

## 2012-02-10 DIAGNOSIS — E039 Hypothyroidism, unspecified: Secondary | ICD-10-CM

## 2012-02-10 DIAGNOSIS — N390 Urinary tract infection, site not specified: Secondary | ICD-10-CM | POA: Diagnosis not present

## 2012-02-10 DIAGNOSIS — M8580 Other specified disorders of bone density and structure, unspecified site: Secondary | ICD-10-CM

## 2012-02-10 DIAGNOSIS — D509 Iron deficiency anemia, unspecified: Secondary | ICD-10-CM | POA: Diagnosis not present

## 2012-02-10 DIAGNOSIS — E611 Iron deficiency: Secondary | ICD-10-CM

## 2012-02-10 MED ORDER — CEPHALEXIN 500 MG PO CAPS: 500.0000 mg | ORAL_CAPSULE | Freq: Three times a day (TID) | ORAL | Status: AC

## 2012-02-10 MED ORDER — LEVOTHYROXINE SODIUM 125 MCG PO TABS: 125.0000 ug | ORAL_TABLET | Freq: Every day | ORAL | Status: DC

## 2012-02-10 NOTE — Telephone Encounter (Signed)
Bone density test 05/2009 T-score 1.4

## 2012-02-10 NOTE — Assessment & Plan Note (Signed)
Symptoms consistent with urinary tract infection. Patient has been unable to tolerate Cipro because of diarrhea. She is allergic to sulfa. Will treat with Keflex 3 times daily times one week. Urine sent for culture. Patient will call if symptoms are not improving.

## 2012-02-10 NOTE — Assessment & Plan Note (Signed)
Recent labs show improvement in hemoglobin to 12.5. Etiology of blood loss was not determined. Patient reports that she was instructed to have repeat stool Hemoccult testing performed. Testing ordered today. We'll plan to repeat CBC with labs in 3 months.

## 2012-02-10 NOTE — Assessment & Plan Note (Signed)
Recent TSH low. Will reduce dose of Synthroid to 125 mcg daily. Repeat TSH and free T4 in 4-6 weeks.

## 2012-02-10 NOTE — Progress Notes (Signed)
Subjective:    Patient ID: Janet Rice, female    DOB: Oct 03, 1945, 66 y.o.   MRN: [id number]  HPI 66 year old female with history of hypothyroidism, hyperlipidemia, and iron deficiency anemia presents for followup. She reports she is generally feeling well. Recent lab work shows improvement in her hemoglobin to 12.5. She reports that her GI physician instructed her to have repeat stool Hemoccult testing but she has not yet done this. She denies any frank blood in her stool or black tarry stool. She denies any fatigue or weakness.  Recent labs also showed low TSH at 0.009. Patient denies any symptoms such as hot or cold intolerance, palpitations, or flushing. She reports full compliance with Synthroid. Next  Patient is also concerned today about recent increased urinary frequency. She denies any hematuria, dysuria, flank pain, fever, chills.  Outpatient Encounter Prescriptions as of 02/10/2012  Medication Sig Dispense Refill  . citalopram (CELEXA) 20 MG tablet TAKE ONE TABLET BY MOUTH EVERY DAY  90 tablet  4  . omeprazole (PRILOSEC) 20 MG capsule TAKE 1 CAPSULE BY MOUTH DAILY  90 capsule  3  . pravastatin (PRAVACHOL) 40 MG tablet TAKE ONE-HALF TABLET BY MOUTH EVERY DAY  30 tablet  6  . DISCONTD: SYNTHROID 150 MCG tablet TAKE 1 TABLET BY MOUTH DAILY (DOSING CHANGE DUE TO LEVEL)  90 tablet  2  . Calcium-Vitamin D 600-125 MG-UNIT TABS Take 1 tablet by mouth daily.        . cephALEXin (KEFLEX) 500 MG capsule Take 1 capsule (500 mg total) by mouth 3 (three) times daily.  21 capsule  0  . levothyroxine (SYNTHROID, LEVOTHROID) 125 MCG tablet Take 1 tablet (125 mcg total) by mouth daily.  90 tablet  3  . Multiple Vitamin (MULTIVITAMIN) tablet Take 1 tablet by mouth daily.          Review of Systems  Constitutional: Negative for fever, chills, appetite change, fatigue and unexpected weight change.  HENT: Negative for ear pain, congestion, sore throat, trouble swallowing, neck pain, voice change  and sinus pressure.   Eyes: Negative for visual disturbance.  Respiratory: Negative for cough, shortness of breath, wheezing and stridor.   Cardiovascular: Negative for chest pain, palpitations and leg swelling.  Gastrointestinal: Negative for nausea, vomiting, abdominal pain, diarrhea, constipation, blood in stool, abdominal distention and anal bleeding.  Genitourinary: Positive for urgency and frequency. Negative for dysuria and flank pain.  Musculoskeletal: Negative for myalgias, arthralgias and gait problem.  Skin: Negative for color change and rash.  Neurological: Negative for dizziness and headaches.  Hematological: Negative for adenopathy. Does not bruise/bleed easily.  Psychiatric/Behavioral: Negative for suicidal ideas, disturbed wake/sleep cycle and dysphoric mood. The patient is not nervous/anxious.    BP 128/60  Pulse 53  Temp 98.3 F (36.8 C) (Oral)  Ht 5' 1.5" (1.562 m)  Wt 188 lb 8 oz (85.503 kg)  BMI 35.04 kg/m2  SpO2 95%     Objective:   Physical Exam  Constitutional: She is oriented to person, place, and time. She appears well-developed and well-nourished. No distress.  HENT:  Head: Normocephalic and atraumatic.  Right Ear: External ear normal.  Left Ear: External ear normal.  Nose: Nose normal.  Mouth/Throat: Oropharynx is clear and moist. No oropharyngeal exudate.  Eyes: Conjunctivae are normal. Pupils are equal, round, and reactive to light. Right eye exhibits no discharge. Left eye exhibits no discharge. No scleral icterus.  Neck: Normal range of motion. Neck supple. No tracheal deviation present. No thyromegaly present.  Cardiovascular: Normal rate, regular rhythm, normal heart sounds and intact distal pulses.  Exam reveals no gallop and no friction rub.   No murmur heard. Pulmonary/Chest: Effort normal and breath sounds normal. No respiratory distress. She has no wheezes. She has no rales. She exhibits no tenderness.  Abdominal: Soft. Bowel sounds are  normal. She exhibits no distension. There is no tenderness.  Musculoskeletal: Normal range of motion. She exhibits no edema and no tenderness.  Lymphadenopathy:    She has no cervical adenopathy.  Neurological: She is alert and oriented to person, place, and time. No cranial nerve deficit. She exhibits normal muscle tone. Coordination normal.  Skin: Skin is warm and dry. No rash noted. She is not diaphoretic. No erythema. No pallor.  Psychiatric: She has a normal mood and affect. Her behavior is normal. Judgment and thought content normal.          Assessment & Plan:

## 2012-02-11 ENCOUNTER — Other Ambulatory Visit (INDEPENDENT_AMBULATORY_CARE_PROVIDER_SITE_OTHER): Payer: Medicare Other | Admitting: *Deleted

## 2012-02-11 DIAGNOSIS — N390 Urinary tract infection, site not specified: Secondary | ICD-10-CM | POA: Diagnosis not present

## 2012-02-11 DIAGNOSIS — D509 Iron deficiency anemia, unspecified: Secondary | ICD-10-CM | POA: Diagnosis not present

## 2012-02-11 DIAGNOSIS — E785 Hyperlipidemia, unspecified: Secondary | ICD-10-CM | POA: Diagnosis not present

## 2012-02-11 LAB — POCT URINALYSIS DIPSTICK
Bilirubin, UA: NEGATIVE
Glucose, UA: NEGATIVE
Ketones, UA: NEGATIVE
Nitrite, UA: POSITIVE
Protein, UA: NEGATIVE
Urobilinogen, UA: 0.2
pH, UA: 5

## 2012-02-11 LAB — POC HEMOCCULT BLD/STL (HOME/3-CARD/SCREEN)

## 2012-02-13 LAB — URINE CULTURE: Colony Count: >=100000

## 2012-03-08 ENCOUNTER — Telehealth: Payer: Self-pay | Admitting: *Deleted

## 2012-03-08 NOTE — Telephone Encounter (Signed)
Message copied by Florene Glen on Wed Mar 08, 2012  9:38 AM ------      Message from: Beverley Fiedler      Created: Tue Mar 07, 2012  2:24 PM       Awaiting FOBT results      ----- Message -----         From: Jobie Quaker, CMA         Sent: 02/11/2012   4:38 PM           To: Beverley Fiedler, MD

## 2012-03-08 NOTE — Telephone Encounter (Signed)
Spoke with pt to inform her there was a mix up here and her stools were never checked for occult blood. Pt stated she asked Dr Dan Humphreys about this on her last visit. Pt states she has labs on 03/21/12 prior to her F/U with Dr Dan Humphreys and she will pick up the cards then. She thanked [place] for calling. Reminder in for labs results.

## 2012-03-10 ENCOUNTER — Telehealth: Payer: Self-pay | Admitting: Internal Medicine

## 2012-03-10 NOTE — Telephone Encounter (Signed)
° ° ° °  Caller: Leeta/Patient; Patient Name: Janet Rice; PCP: Ronna Polio (Adults only); Best Callback Phone Number: [phone].  Pateint is having urinary pressure and not able to  empty bladder.  She had a urinary tract infection over 2 weeks ago and was on Cefalexin.  Symptoms returned 03/04/12. She is calling for "another round of antibiotics.  She was seen with this last infection and had  a urinalysis done.    Triaged Urinary Symptoms and last voided at 1145. All emergent symptoms ruled out.  Needs to be seen in 24 hours.  Dr. Dan Humphreys has no appointments left and offered to schedule with a NP at another office and patient refused this states "I'm not going all that way".  Instructed she can call back later this afternoon to see if appointments are opened for 03/11/12. Call back instructions given.

## 2012-03-22 ENCOUNTER — Other Ambulatory Visit: Payer: Self-pay | Admitting: Internal Medicine

## 2012-03-22 DIAGNOSIS — D509 Iron deficiency anemia, unspecified: Secondary | ICD-10-CM | POA: Diagnosis not present

## 2012-03-22 DIAGNOSIS — E039 Hypothyroidism, unspecified: Secondary | ICD-10-CM | POA: Diagnosis not present

## 2012-03-23 LAB — TSH: TSH: 0.016 u[IU]/mL — ABNORMAL LOW (ref 0.450–4.500)

## 2012-03-23 LAB — T4: T4, Total: 10.6 ug/dL (ref 4.5–12.0)

## 2012-03-23 LAB — FECAL OCCULT BLOOD, IMMUNOCHEMICAL: Fecal Occult Bld: NEGATIVE

## 2012-03-24 ENCOUNTER — Encounter: Payer: Self-pay | Admitting: Internal Medicine

## 2012-03-24 ENCOUNTER — Ambulatory Visit (INDEPENDENT_AMBULATORY_CARE_PROVIDER_SITE_OTHER): Payer: Medicare Other | Admitting: Internal Medicine

## 2012-03-24 VITALS — BP 102/60 | HR 61 | Temp 98.6°F | Ht 61.5 in | Wt 189.5 lb

## 2012-03-24 DIAGNOSIS — D509 Iron deficiency anemia, unspecified: Secondary | ICD-10-CM

## 2012-03-24 DIAGNOSIS — E611 Iron deficiency: Secondary | ICD-10-CM

## 2012-03-24 DIAGNOSIS — Z23 Encounter for immunization: Secondary | ICD-10-CM | POA: Diagnosis not present

## 2012-03-24 DIAGNOSIS — E039 Hypothyroidism, unspecified: Secondary | ICD-10-CM

## 2012-03-24 MED ORDER — LEVOTHYROXINE SODIUM 100 MCG PO TABS: 100.0000 ug | ORAL_TABLET | Freq: Every day | ORAL | Status: DC

## 2012-03-24 NOTE — Progress Notes (Signed)
Subjective:    Patient ID: Janet Rice, female    DOB: Oct 16, 1945, 66 y.o.   MRN: [id number]  HPI 66 year old female presents for followup of hypothyroidism. Her dose of Synthroid was recently decreased from 150-125 mcg. She reports that with initial decrease she felt some increased fatigue but this subsided after a couple of days. She is now feeling well with no fatigue, constipation, temperature intolerance, or other symptoms. Repeat TSH performed yesterday was persistently low at 0.016. We discussed that we will need to decrease the dose of her Synthroid again, this time to 100 mcg daily.  Aside from this, she reports she is feeling well. She denies any new concerns today.  Outpatient Encounter Prescriptions as of 03/24/2012  Medication Sig Dispense Refill  . citalopram (CELEXA) 20 MG tablet TAKE ONE TABLET BY MOUTH EVERY DAY  90 tablet  4  . levothyroxine (SYNTHROID, LEVOTHROID) 100 MCG tablet Take 1 tablet (100 mcg total) by mouth daily.  90 tablet  3  . omeprazole (PRILOSEC) 20 MG capsule TAKE 1 CAPSULE BY MOUTH DAILY  90 capsule  3  . pravastatin (PRAVACHOL) 40 MG tablet TAKE ONE-HALF TABLET BY MOUTH EVERY DAY  30 tablet  6  . DISCONTD: levothyroxine (SYNTHROID, LEVOTHROID) 125 MCG tablet Take 1 tablet (125 mcg total) by mouth daily.  90 tablet  3  . Multiple Vitamin (MULTIVITAMIN) tablet Take 1 tablet by mouth daily.        Marland Kitchen DISCONTD: Calcium-Vitamin D 600-125 MG-UNIT TABS Take 1 tablet by mouth daily.         BP 102/60  Pulse 61  Temp 98.6 F (37 C) (Oral)  Ht 5' 1.5" (1.562 m)  Wt 189 lb 8 oz (85.957 kg)  BMI 35.23 kg/m2  SpO2 95%  Review of Systems  Constitutional: Negative for fever, chills, appetite change, fatigue and unexpected weight change.  HENT: Negative for ear pain, congestion, sore throat, trouble swallowing, neck pain, voice change and sinus pressure.   Eyes: Negative for visual disturbance.  Respiratory: Negative for cough, shortness of breath, wheezing  and stridor.   Cardiovascular: Negative for chest pain, palpitations and leg swelling.  Gastrointestinal: Negative for nausea, vomiting, abdominal pain, diarrhea, constipation, blood in stool, abdominal distention and anal bleeding.  Genitourinary: Negative for dysuria and flank pain.  Musculoskeletal: Negative for myalgias, arthralgias and gait problem.  Skin: Negative for color change and rash.  Neurological: Negative for dizziness and headaches.  Hematological: Negative for adenopathy. Does not bruise/bleed easily.  Psychiatric/Behavioral: Negative for suicidal ideas, disturbed wake/sleep cycle and dysphoric mood. The patient is not nervous/anxious.        Objective:   Physical Exam  Constitutional: She is oriented to person, place, and time. She appears well-developed and well-nourished. No distress.  HENT:  Head: Normocephalic and atraumatic.  Right Ear: External ear normal.  Left Ear: External ear normal.  Nose: Nose normal.  Mouth/Throat: Oropharynx is clear and moist. No oropharyngeal exudate.  Eyes: Conjunctivae normal are normal. Pupils are equal, round, and reactive to light. Right eye exhibits no discharge. Left eye exhibits no discharge. No scleral icterus.  Neck: Normal range of motion. Neck supple. No tracheal deviation present. No thyromegaly present.  Cardiovascular: Normal rate, regular rhythm, normal heart sounds and intact distal pulses.  Exam reveals no gallop and no friction rub.   No murmur heard. Pulmonary/Chest: Effort normal and breath sounds normal. No respiratory distress. She has no wheezes. She has no rales. She exhibits no tenderness.  Musculoskeletal:  Normal range of motion. She exhibits no edema and no tenderness.  Lymphadenopathy:    She has no cervical adenopathy.  Neurological: She is alert and oriented to person, place, and time. No cranial nerve deficit. She exhibits normal muscle tone. Coordination normal.  Skin: Skin is warm and dry. No rash  noted. She is not diaphoretic. No erythema. No pallor.  Psychiatric: She has a normal mood and affect. Her behavior is normal. Judgment and thought content normal.          Assessment & Plan:

## 2012-03-24 NOTE — Assessment & Plan Note (Signed)
Last CBC was markedly improved. Stool Hemoccult testing was negative. Will plan to repeat CBC with labs next month.

## 2012-03-24 NOTE — Assessment & Plan Note (Signed)
TSH is persistently low. Will decrease dose of levothyroxine to 100 mcg daily. Repeat TSH and free T4 in 4 weeks.

## 2012-03-27 ENCOUNTER — Encounter: Payer: Self-pay | Admitting: Internal Medicine

## 2012-03-27 ENCOUNTER — Ambulatory Visit: Payer: Self-pay | Admitting: Oncology

## 2012-03-27 DIAGNOSIS — D509 Iron deficiency anemia, unspecified: Secondary | ICD-10-CM | POA: Diagnosis not present

## 2012-03-27 DIAGNOSIS — Z79899 Other long term (current) drug therapy: Secondary | ICD-10-CM | POA: Diagnosis not present

## 2012-04-11 ENCOUNTER — Ambulatory Visit: Payer: Self-pay | Admitting: Oncology

## 2012-05-03 ENCOUNTER — Encounter: Payer: Self-pay | Admitting: Internal Medicine

## 2012-05-03 ENCOUNTER — Telehealth: Payer: Self-pay | Admitting: Internal Medicine

## 2012-05-03 NOTE — Telephone Encounter (Signed)
Patient is scheduled to see you on 05/11/12 and wanted to have blood work prior please fax requisition to [facility]. She states that she is suppose to have her thyroid recheck. Please advise patient when this is done.

## 2012-05-05 ENCOUNTER — Other Ambulatory Visit: Payer: Self-pay

## 2012-05-05 ENCOUNTER — Encounter: Payer: Self-pay | Admitting: *Deleted

## 2012-05-05 DIAGNOSIS — E039 Hypothyroidism, unspecified: Secondary | ICD-10-CM

## 2012-05-05 NOTE — Telephone Encounter (Signed)
Lab tests have been entered and you can go by [facility] to have them drawn T4 And TSH.

## 2012-05-05 NOTE — Progress Notes (Signed)
Labs entered and released to [facility].

## 2012-05-05 NOTE — Telephone Encounter (Signed)
Review of record supports TSH and T4,free is to be drawn. Orders placed and released to [facility]. Patient called with message left on Mobile # that orders are entered.

## 2012-05-09 DIAGNOSIS — E039 Hypothyroidism, unspecified: Secondary | ICD-10-CM | POA: Diagnosis not present

## 2012-05-10 ENCOUNTER — Telehealth: Payer: Self-pay | Admitting: Internal Medicine

## 2012-05-10 DIAGNOSIS — E039 Hypothyroidism, unspecified: Secondary | ICD-10-CM

## 2012-05-10 MED ORDER — LEVOTHYROXINE SODIUM 88 MCG PO TABS: 88.0000 ug | ORAL_TABLET | Freq: Every day | ORAL | Status: DC

## 2012-05-10 NOTE — Telephone Encounter (Signed)
Thyroid labs showed TSH 1.15 and Free t4 of 0.355. I would like to decrease your synthroid to daily and repeat thyroid testing in 4 weeks.

## 2012-05-11 ENCOUNTER — Ambulatory Visit (INDEPENDENT_AMBULATORY_CARE_PROVIDER_SITE_OTHER): Payer: Medicare Other | Admitting: Internal Medicine

## 2012-05-11 ENCOUNTER — Encounter: Payer: Self-pay | Admitting: Internal Medicine

## 2012-05-11 VITALS — BP 110/60 | HR 60 | Temp 98.5°F | Ht 61.5 in | Wt 189.5 lb

## 2012-05-11 DIAGNOSIS — E039 Hypothyroidism, unspecified: Secondary | ICD-10-CM

## 2012-05-11 DIAGNOSIS — Z1239 Encounter for other screening for malignant neoplasm of breast: Secondary | ICD-10-CM

## 2012-05-11 DIAGNOSIS — Z0000 Encounter for general adult medical examination without abnormal findings: Secondary | ICD-10-CM

## 2012-05-11 DIAGNOSIS — Z23 Encounter for immunization: Secondary | ICD-10-CM

## 2012-05-11 MED ORDER — LEVOTHYROXINE SODIUM 100 MCG PO TABS: 100.0000 ug | ORAL_TABLET | Freq: Every day | ORAL | Status: DC

## 2012-05-11 MED ORDER — ZOSTER VACCINE LIVE 19400 UNT/0.65ML ~~LOC~~ SOLR: 0.6500 mL | Freq: Once | SUBCUTANEOUS | Status: DC

## 2012-05-11 NOTE — Assessment & Plan Note (Signed)
General medical exam including breast exam normal today. Pap deferred because of age and status post hysterectomy. Other health maintenance is up to date. Immunizations are up-to-date. Will plan to recheck labs including CBC, CMP, lipid profile in 3 months. Mammogram and bone density testing ordered today. Followup in 3 months.

## 2012-05-11 NOTE — Progress Notes (Addendum)
Subjective:    Patient ID: Janet Rice, female    DOB: [birth year], 66 y.o.   MRN: [id number]  HPI The patient is here for annual Medicare wellness examination and management of other chronic and acute problems.   The risk factors are reflected in the social history.  The roster of all physicians providing medical care to patient - is listed in the Snapshot section of the chart.  Activities of daily living:  The patient is 100% independent in all ADLs: dressing, toileting, feeding as well as independent mobility  Home safety : The patient has smoke detectors in the home. They wear seatbelts.  There are no firearms at home. There is no violence in the home.   There is no risks for hepatitis, STDs or HIV. There is no history of blood transfusion. They have no travel history to infectious disease endemic areas of the world.  The patient has seen their dentist in the last six month. (Dr. Axel Filler) They have seen their eye doctor in the last year. (Dr.Sirek - pt daughter) No hearing issues. They have deferred audiologic testing in the last year.    Dermatologist - [facility] - Dr. Roseanne Kaufman. They do not  have excessive sun exposure. Discussed the need for sun protection: hats, long sleeves and use of sunscreen if there is significant sun exposure.   Diet: the importance of a healthy diet is discussed. They do have a healthy diet.  The benefits of regular aerobic exercise were discussed. She walks occasionally, garden.  Depression screen: there are no signs or vegative symptoms of depression- irritability, change in appetite, anhedonia, sadness/tearfullness.  Cognitive assessment: the patient manages all their financial and personal affairs and is actively engaged. They could relate day,date,year and events.  HCPOA - husband, Janet Rice, then daughter Janet Rice  The following portions of the patient's history were reviewed and updated as appropriate: allergies, current  medications, past family history, past medical history,  past surgical history, past social history  and problem list.  Visual acuity was not assessed per patient preference since she has regular follow up with her ophthalmologist. Hearing and body mass index were assessed and reviewed.   During the course of the visit the patient was educated and counseled about appropriate screening and preventive services including : fall prevention , diabetes screening, nutrition counseling, colorectal cancer screening, and recommended immunizations.     Outpatient Encounter Prescriptions as of 05/11/2012  Medication Sig Dispense Refill  . citalopram (CELEXA) 20 MG tablet TAKE ONE TABLET BY MOUTH EVERY DAY  90 tablet  4  . levothyroxine (SYNTHROID, LEVOTHROID) 100 MCG tablet Take 1 tablet (100 mcg total) by mouth daily.  90 tablet  4  . omeprazole (PRILOSEC) 20 MG capsule TAKE 1 CAPSULE BY MOUTH DAILY  90 capsule  3  . pravastatin (PRAVACHOL) 40 MG tablet TAKE ONE-HALF TABLET BY MOUTH EVERY DAY  30 tablet  6  . DISCONTD: levothyroxine (SYNTHROID, LEVOTHROID) 100 MCG tablet Take 100 mcg by mouth daily.      Marland Kitchen DISCONTD: levothyroxine (SYNTHROID, LEVOTHROID) 88 MCG tablet Take 1 tablet (88 mcg total) by mouth daily.  90 tablet  3  . Multiple Vitamin (MULTIVITAMIN) tablet Take 1 tablet by mouth daily.        Marland Kitchen zoster vaccine live, PF, (ZOSTAVAX) 40981 UNT/0.65ML injection Inject 19,400 Units into the skin once.  1 each  0   BP 110/60  Pulse 60  Temp 98.5 F (36.9 C) (Oral)  Ht 5'  1.5" (1.562 m)  Wt 189 lb 8 oz (85.957 kg)  BMI 35.23 kg/m2  SpO2 97%  Review of Systems  Constitutional: Negative for fever, chills, appetite change, fatigue and unexpected weight change.  HENT: Negative for ear pain, congestion, sore throat, trouble swallowing, neck pain, voice change and sinus pressure.   Eyes: Negative for visual disturbance.  Respiratory: Negative for cough, shortness of breath, wheezing and stridor.     Cardiovascular: Negative for chest pain, palpitations and leg swelling.  Gastrointestinal: Negative for nausea, vomiting, abdominal pain, diarrhea, constipation, blood in stool, abdominal distention and anal bleeding.  Genitourinary: Negative for dysuria and flank pain.  Musculoskeletal: Negative for myalgias, arthralgias and gait problem.  Skin: Negative for color change and rash.  Neurological: Negative for dizziness and headaches.  Hematological: Negative for adenopathy. Does not bruise/bleed easily.  Psychiatric/Behavioral: Negative for suicidal ideas, disturbed wake/sleep cycle and dysphoric mood. The patient is not nervous/anxious.        Objective:   Physical Exam  Constitutional: She is oriented to person, place, and time. She appears well-developed and well-nourished. No distress.  HENT:  Head: Normocephalic and atraumatic.  Right Ear: External ear normal.  Left Ear: External ear normal.  Nose: Nose normal.  Mouth/Throat: Oropharynx is clear and moist. No oropharyngeal exudate.  Eyes: Conjunctivae normal are normal. Pupils are equal, round, and reactive to light. Right eye exhibits no discharge. Left eye exhibits no discharge. No scleral icterus.  Neck: Normal range of motion. Neck supple. No tracheal deviation present. No thyromegaly present.  Cardiovascular: Normal rate, regular rhythm, normal heart sounds and intact distal pulses.  Exam reveals no gallop and no friction rub.   No murmur heard. Pulmonary/Chest: Effort normal and breath sounds normal. No respiratory distress. She has no wheezes. She has no rales. She exhibits no tenderness. Right breast exhibits no inverted nipple, no mass, no nipple discharge, no skin change and no tenderness. Left breast exhibits no inverted nipple, no mass, no nipple discharge, no skin change and no tenderness. Breasts are symmetrical.  Abdominal: Soft. Bowel sounds are normal. She exhibits no distension. There is no tenderness. There is no  rebound.  Musculoskeletal: Normal range of motion. She exhibits no edema and no tenderness.  Lymphadenopathy:    She has no cervical adenopathy.  Neurological: She is alert and oriented to person, place, and time. No cranial nerve deficit. She exhibits normal muscle tone. Coordination normal.  Skin: Skin is warm and dry. No rash noted. She is not diaphoretic. No erythema. No pallor.  Psychiatric: She has a normal mood and affect. Her behavior is normal. Judgment and thought content normal.          Assessment & Plan:

## 2012-05-11 NOTE — Assessment & Plan Note (Signed)
Recent TSH oversuppressed at 0.35 however T4 normal. We discussed potentially reducing dose of Synthroid to 88 mcg daily however patient feels that she is tolerating 100 mcg dose well and noted some fatigue associated with drop in medication dose from 125-100 mcg. We'll plan to keep her at 100 mcg of Synthroid daily with repeat labs in 3 months.

## 2012-05-18 ENCOUNTER — Ambulatory Visit: Payer: Self-pay | Admitting: Internal Medicine

## 2012-05-18 ENCOUNTER — Telehealth: Payer: Self-pay | Admitting: Internal Medicine

## 2012-05-18 DIAGNOSIS — M810 Age-related osteoporosis without current pathological fracture: Secondary | ICD-10-CM | POA: Diagnosis not present

## 2012-05-18 DIAGNOSIS — M899 Disorder of bone, unspecified: Secondary | ICD-10-CM | POA: Diagnosis not present

## 2012-05-18 DIAGNOSIS — M949 Disorder of cartilage, unspecified: Secondary | ICD-10-CM | POA: Diagnosis not present

## 2012-05-18 NOTE — Telephone Encounter (Signed)
Bone Density T-score -3.2.  MyChart message to set up follow up to discuss treatment. Needs repeat 05/2013.

## 2012-05-26 ENCOUNTER — Encounter: Payer: Self-pay | Admitting: Internal Medicine

## 2012-06-05 ENCOUNTER — Ambulatory Visit: Payer: Self-pay | Admitting: Internal Medicine

## 2012-06-05 DIAGNOSIS — Z1231 Encounter for screening mammogram for malignant neoplasm of breast: Secondary | ICD-10-CM | POA: Diagnosis not present

## 2012-06-05 LAB — HM MAMMOGRAPHY: HM Mammogram: NORMAL

## 2012-06-21 DIAGNOSIS — D649 Anemia, unspecified: Secondary | ICD-10-CM | POA: Diagnosis not present

## 2012-06-21 DIAGNOSIS — C439 Malignant melanoma of skin, unspecified: Secondary | ICD-10-CM | POA: Diagnosis not present

## 2012-06-22 ENCOUNTER — Other Ambulatory Visit: Payer: Self-pay | Admitting: Internal Medicine

## 2012-06-26 ENCOUNTER — Ambulatory Visit: Payer: Self-pay | Admitting: Oncology

## 2012-06-26 DIAGNOSIS — Z79899 Other long term (current) drug therapy: Secondary | ICD-10-CM | POA: Diagnosis not present

## 2012-06-26 DIAGNOSIS — E039 Hypothyroidism, unspecified: Secondary | ICD-10-CM | POA: Diagnosis not present

## 2012-06-26 DIAGNOSIS — Z5181 Encounter for therapeutic drug level monitoring: Secondary | ICD-10-CM | POA: Diagnosis not present

## 2012-06-26 DIAGNOSIS — Z85820 Personal history of malignant melanoma of skin: Secondary | ICD-10-CM | POA: Diagnosis not present

## 2012-06-26 DIAGNOSIS — D509 Iron deficiency anemia, unspecified: Secondary | ICD-10-CM | POA: Diagnosis not present

## 2012-07-06 ENCOUNTER — Encounter: Payer: Self-pay | Admitting: Internal Medicine

## 2012-07-12 ENCOUNTER — Ambulatory Visit: Payer: Self-pay | Admitting: Oncology

## 2012-07-19 DIAGNOSIS — D485 Neoplasm of uncertain behavior of skin: Secondary | ICD-10-CM | POA: Diagnosis not present

## 2012-07-19 DIAGNOSIS — Z85828 Personal history of other malignant neoplasm of skin: Secondary | ICD-10-CM | POA: Diagnosis not present

## 2012-07-19 DIAGNOSIS — L570 Actinic keratosis: Secondary | ICD-10-CM | POA: Diagnosis not present

## 2012-08-08 ENCOUNTER — Other Ambulatory Visit: Payer: Self-pay | Admitting: Internal Medicine

## 2012-08-08 DIAGNOSIS — E785 Hyperlipidemia, unspecified: Secondary | ICD-10-CM | POA: Diagnosis not present

## 2012-08-08 DIAGNOSIS — E039 Hypothyroidism, unspecified: Secondary | ICD-10-CM | POA: Diagnosis not present

## 2012-08-09 LAB — CBC WITH DIFFERENTIAL
Basophils Absolute: 0 10*3/uL (ref 0.0–0.2)
Basos: 1 % (ref 0–3)
Eos: 4 % (ref 0–5)
Eosinophils Absolute: 0.4 10*3/uL (ref 0.0–0.4)
HCT: 40.3 % (ref 34.0–46.6)
Hemoglobin: 13.3 g/dL (ref 11.1–15.9)
Immature Grans (Abs): 0 10*3/uL (ref 0.0–0.1)
Immature Granulocytes: 0 % (ref 0–2)
Lymphocytes Absolute: 2.1 10*3/uL (ref 0.7–3.1)
Lymphs: 27 % (ref 14–46)
MCH: 26.5 pg — ABNORMAL LOW (ref 26.6–33.0)
MCHC: 33 g/dL (ref 31.5–35.7)
MCV: 80 fL (ref 79–97)
Monocytes Absolute: 0.4 10*3/uL (ref 0.1–0.9)
Monocytes: 5 % (ref 4–12)
Neutrophils Absolute: 5 10*3/uL (ref 1.4–7.0)
Neutrophils Relative %: 63 % (ref 40–74)
Platelets: 147 10*3/uL — ABNORMAL LOW (ref 155–379)
RBC: 5.02 x10E6/uL (ref 3.77–5.28)
RDW: 16.3 % — ABNORMAL HIGH (ref 12.3–15.4)
WBC: 7.9 10*3/uL (ref 3.4–10.8)

## 2012-08-09 LAB — COMPREHENSIVE METABOLIC PANEL
ALT: 35 IU/L — ABNORMAL HIGH (ref 0–32)
AST: 32 IU/L (ref 0–40)
Albumin/Globulin Ratio: 1.3 (ref 1.1–2.5)
Albumin: 4 g/dL (ref 3.6–4.8)
Alkaline Phosphatase: 150 IU/L — ABNORMAL HIGH (ref 39–117)
BUN/Creatinine Ratio: 12 (ref 11–26)
BUN: 13 mg/dL (ref 8–27)
CO2: 26 mmol/L (ref 19–28)
Calcium: 9.2 mg/dL (ref 8.6–10.2)
Chloride: 102 mmol/L (ref 97–108)
Creatinine, Ser: 1.07 mg/dL — ABNORMAL HIGH (ref 0.57–1.00)
GFR calc Af Amer: 63 mL/min/{1.73_m2} (ref >59)
GFR calc non Af Amer: 54 mL/min/{1.73_m2} — ABNORMAL LOW (ref >59)
Globulin, Total: 3 g/dL (ref 1.5–4.5)
Glucose: 100 mg/dL — ABNORMAL HIGH (ref 65–99)
Potassium: 4.9 mmol/L (ref 3.5–5.2)
Sodium: 141 mmol/L (ref 134–144)
Total Bilirubin: 0.2 mg/dL (ref 0.0–1.2)
Total Protein: 7 g/dL (ref 6.0–8.5)

## 2012-08-09 LAB — LIPID PANEL W/O CHOL/HDL RATIO
Cholesterol, Total: 210 mg/dL — ABNORMAL HIGH (ref 100–199)
HDL: 55 mg/dL (ref >39)
LDL Calculated: 132 mg/dL — ABNORMAL HIGH (ref 0–99)
Triglycerides: 114 mg/dL (ref 0–149)
VLDL Cholesterol Cal: 23 mg/dL (ref 5–40)

## 2012-08-09 LAB — TSH: TSH: 2.9 u[IU]/mL (ref 0.450–4.500)

## 2012-08-09 LAB — T4: T4, Total: 7.4 ug/dL (ref 4.5–12.0)

## 2012-08-11 ENCOUNTER — Ambulatory Visit: Payer: Medicare Other | Admitting: Internal Medicine

## 2012-08-15 ENCOUNTER — Encounter: Payer: Self-pay | Admitting: Internal Medicine

## 2012-08-15 ENCOUNTER — Ambulatory Visit (INDEPENDENT_AMBULATORY_CARE_PROVIDER_SITE_OTHER): Payer: Medicare Other | Admitting: Internal Medicine

## 2012-08-15 VITALS — BP 106/58 | HR 64 | Temp 98.3°F | Ht 63.5 in | Wt 183.0 lb

## 2012-08-15 DIAGNOSIS — E669 Obesity, unspecified: Secondary | ICD-10-CM

## 2012-08-15 DIAGNOSIS — E039 Hypothyroidism, unspecified: Secondary | ICD-10-CM

## 2012-08-15 DIAGNOSIS — E785 Hyperlipidemia, unspecified: Secondary | ICD-10-CM

## 2012-08-15 DIAGNOSIS — M810 Age-related osteoporosis without current pathological fracture: Secondary | ICD-10-CM

## 2012-08-15 DIAGNOSIS — Z23 Encounter for immunization: Secondary | ICD-10-CM | POA: Diagnosis not present

## 2012-08-15 MED ORDER — ZOSTER VACCINE LIVE 19400 UNT/0.65ML ~~LOC~~ SOLR: 0.6500 mL | Freq: Once | SUBCUTANEOUS | Status: DC

## 2012-08-15 NOTE — Assessment & Plan Note (Signed)
Bone density test showed T-score -3.2. Discussed Vit D supplementation and adequate Ca intake. Recommended bisphosphonate, however pt declined and would like to try increased Vit D supplementation and weight bearing exercise. Will check Vit D level today. Goal Vit D 40-60.

## 2012-08-15 NOTE — Assessment & Plan Note (Signed)
Body mass index is 31.91 kg/(m^2). Congratulated pt on weight loss. Encouraged continued efforts at diet and exercise.

## 2012-08-15 NOTE — Assessment & Plan Note (Signed)
Recent TSH improved. Will continue levothyroxine at current dose.

## 2012-08-15 NOTE — Progress Notes (Signed)
Subjective:    Patient ID: Janet Rice, female    DOB: [birth year], 67 y.o.   MRN: [id number]  HPI 67 year old female with history of hyperlipidemia, hypothyroidism presents for followup. She reports she is generally feeling well. She has been trying to lose weight by following a healthier diet and increasing her physical activity. She recently had an episode where she was ill and she developed some pain in the muscles of her thighs but this has resolved. She occasionally has pain in her left thigh at previous surgical site this is intermittent. She does not take medication for this. She reports compliance with her other medications. She denies any new concerns today.  Outpatient Encounter Prescriptions as of 08/15/2012  Medication Sig Dispense Refill  . citalopram (CELEXA) 20 MG tablet TAKE ONE TABLET BY MOUTH EVERY DAY  90 tablet  3  . levothyroxine (SYNTHROID, LEVOTHROID) 100 MCG tablet Take 1 tablet (100 mcg total) by mouth daily.  90 tablet  4  . omeprazole (PRILOSEC) 20 MG capsule TAKE 1 CAPSULE BY MOUTH DAILY  90 capsule  3  . pravastatin (PRAVACHOL) 40 MG tablet TAKE ONE-HALF TABLET BY MOUTH EVERY DAY  30 tablet  6  . Multiple Vitamin (MULTIVITAMIN) tablet Take 1 tablet by mouth daily.        Marland Kitchen zoster vaccine live, PF, (ZOSTAVAX) 29562 UNT/0.65ML injection Inject 19,400 Units into the skin once.  1 each  0  . [DISCONTINUED] zoster vaccine live, PF, (ZOSTAVAX) 13086 UNT/0.65ML injection Inject 19,400 Units into the skin once.  1 each  0   BP 106/58  Pulse 64  Temp 98.3 F (36.8 C) (Oral)  Ht 5' 3.5" (1.613 m)  Wt 183 lb (83.008 kg)  BMI 31.91 kg/m2  SpO2 94%  Review of Systems  Constitutional: Negative for fever, chills, appetite change, fatigue and unexpected weight change.  HENT: Negative for ear pain, congestion, sore throat, trouble swallowing, neck pain, voice change and sinus pressure.   Eyes: Negative for visual disturbance.  Respiratory: Negative for cough, shortness  of breath, wheezing and stridor.   Cardiovascular: Negative for chest pain, palpitations and leg swelling.  Gastrointestinal: Negative for nausea, vomiting, abdominal pain, diarrhea, constipation, blood in stool, abdominal distention and anal bleeding.  Genitourinary: Negative for dysuria and flank pain.  Musculoskeletal: Negative for myalgias, arthralgias and gait problem.  Skin: Negative for color change and rash.  Neurological: Negative for dizziness and headaches.  Hematological: Negative for adenopathy. Does not bruise/bleed easily.  Psychiatric/Behavioral: Negative for suicidal ideas, sleep disturbance and dysphoric mood. The patient is not nervous/anxious.        Objective:   Physical Exam  Constitutional: She is oriented to person, place, and time. She appears well-developed and well-nourished. No distress.  HENT:  Head: Normocephalic and atraumatic.  Right Ear: External ear normal.  Left Ear: External ear normal.  Nose: Nose normal.  Mouth/Throat: Oropharynx is clear and moist. No oropharyngeal exudate.  Eyes: Conjunctivae normal are normal. Pupils are equal, round, and reactive to light. Right eye exhibits no discharge. Left eye exhibits no discharge. No scleral icterus.  Neck: Normal range of motion. Neck supple. No tracheal deviation present. No thyromegaly present.  Cardiovascular: Normal rate, regular rhythm, normal heart sounds and intact distal pulses.  Exam reveals no gallop and no friction rub.   No murmur heard. Pulmonary/Chest: Effort normal and breath sounds normal. No respiratory distress. She has no wheezes. She has no rales. She exhibits no tenderness.  Musculoskeletal: Normal range of  motion. She exhibits no edema and no tenderness.  Lymphadenopathy:    She has no cervical adenopathy.  Neurological: She is alert and oriented to person, place, and time. No cranial nerve deficit. She exhibits normal muscle tone. Coordination normal.  Skin: Skin is warm and dry. No  rash noted. She is not diaphoretic. No erythema. No pallor.  Psychiatric: She has a normal mood and affect. Her behavior is normal. Judgment and thought content normal.          Assessment & Plan:

## 2012-08-16 ENCOUNTER — Telehealth: Payer: Self-pay | Admitting: Internal Medicine

## 2012-08-16 MED ORDER — ERGOCALCIFEROL 1.25 MG (50000 UT) PO CAPS: 50000.0000 [IU] | ORAL_CAPSULE | ORAL | Status: DC

## 2012-08-16 NOTE — Telephone Encounter (Signed)
Vit D low 24

## 2012-08-17 ENCOUNTER — Encounter: Payer: Self-pay | Admitting: Internal Medicine

## 2012-08-26 ENCOUNTER — Other Ambulatory Visit: Payer: Self-pay

## 2012-08-28 ENCOUNTER — Encounter: Payer: Self-pay | Admitting: Internal Medicine

## 2012-09-22 ENCOUNTER — Ambulatory Visit: Payer: Self-pay | Admitting: Oncology

## 2012-09-22 DIAGNOSIS — Z9089 Acquired absence of other organs: Secondary | ICD-10-CM | POA: Diagnosis not present

## 2012-09-22 DIAGNOSIS — E039 Hypothyroidism, unspecified: Secondary | ICD-10-CM | POA: Diagnosis not present

## 2012-09-22 DIAGNOSIS — Z79899 Other long term (current) drug therapy: Secondary | ICD-10-CM | POA: Diagnosis not present

## 2012-09-22 DIAGNOSIS — D509 Iron deficiency anemia, unspecified: Secondary | ICD-10-CM | POA: Diagnosis not present

## 2012-09-22 DIAGNOSIS — Z5181 Encounter for therapeutic drug level monitoring: Secondary | ICD-10-CM | POA: Diagnosis not present

## 2012-09-22 DIAGNOSIS — Z90710 Acquired absence of both cervix and uterus: Secondary | ICD-10-CM | POA: Diagnosis not present

## 2012-09-22 DIAGNOSIS — C439 Malignant melanoma of skin, unspecified: Secondary | ICD-10-CM | POA: Diagnosis not present

## 2012-09-22 DIAGNOSIS — Z85820 Personal history of malignant melanoma of skin: Secondary | ICD-10-CM | POA: Diagnosis not present

## 2012-09-25 DIAGNOSIS — Z85820 Personal history of malignant melanoma of skin: Secondary | ICD-10-CM | POA: Diagnosis not present

## 2012-09-25 DIAGNOSIS — D509 Iron deficiency anemia, unspecified: Secondary | ICD-10-CM | POA: Diagnosis not present

## 2012-09-27 DIAGNOSIS — L570 Actinic keratosis: Secondary | ICD-10-CM | POA: Diagnosis not present

## 2012-09-27 DIAGNOSIS — L219 Seborrheic dermatitis, unspecified: Secondary | ICD-10-CM | POA: Diagnosis not present

## 2012-10-10 ENCOUNTER — Ambulatory Visit: Payer: Self-pay | Admitting: Oncology

## 2012-11-02 ENCOUNTER — Other Ambulatory Visit: Payer: Self-pay | Admitting: *Deleted

## 2012-11-02 DIAGNOSIS — E039 Hypothyroidism, unspecified: Secondary | ICD-10-CM

## 2012-11-02 MED ORDER — LEVOTHYROXINE SODIUM 100 MCG PO TABS: 100.0000 ug | ORAL_TABLET | Freq: Every day | ORAL | Status: DC

## 2012-11-02 NOTE — Telephone Encounter (Signed)
Eprescribed.

## 2012-11-24 ENCOUNTER — Other Ambulatory Visit: Payer: Self-pay | Admitting: Internal Medicine

## 2012-11-24 NOTE — Telephone Encounter (Signed)
Rx sent to pharmacy by escript  

## 2012-12-11 ENCOUNTER — Telehealth: Payer: Self-pay | Admitting: *Deleted

## 2012-12-11 NOTE — Telephone Encounter (Signed)
That is fine, but will need to have TSH checked in 4 weeks.

## 2012-12-11 NOTE — Telephone Encounter (Signed)
Levothyroxine #90  Take one PO QD  Pharmacist wants to know if she can switch to [facility] generic?-please advise & refill

## 2012-12-12 ENCOUNTER — Telehealth: Payer: Self-pay | Admitting: Internal Medicine

## 2012-12-12 NOTE — Telephone Encounter (Signed)
The patient is needing her labs done at [facility] . 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

## 2012-12-12 NOTE — Telephone Encounter (Signed)
Switch to [facility] at the pharmacy and called patient and she set for labs to get her TSH checked

## 2012-12-13 NOTE — Telephone Encounter (Signed)
Patient will come pick up the form one day next week

## 2012-12-13 NOTE — Telephone Encounter (Signed)
Order placed on your desk. °

## 2012-12-19 DIAGNOSIS — C439 Malignant melanoma of skin, unspecified: Secondary | ICD-10-CM | POA: Diagnosis not present

## 2012-12-19 DIAGNOSIS — C779 Secondary and unspecified malignant neoplasm of lymph node, unspecified: Secondary | ICD-10-CM | POA: Diagnosis not present

## 2012-12-19 DIAGNOSIS — D509 Iron deficiency anemia, unspecified: Secondary | ICD-10-CM | POA: Diagnosis not present

## 2012-12-22 ENCOUNTER — Ambulatory Visit: Payer: Self-pay | Admitting: Oncology

## 2012-12-22 DIAGNOSIS — I890 Lymphedema, not elsewhere classified: Secondary | ICD-10-CM | POA: Diagnosis not present

## 2012-12-22 DIAGNOSIS — E039 Hypothyroidism, unspecified: Secondary | ICD-10-CM | POA: Diagnosis not present

## 2012-12-22 DIAGNOSIS — D509 Iron deficiency anemia, unspecified: Secondary | ICD-10-CM | POA: Diagnosis not present

## 2012-12-22 DIAGNOSIS — Z85820 Personal history of malignant melanoma of skin: Secondary | ICD-10-CM | POA: Diagnosis not present

## 2012-12-22 DIAGNOSIS — L659 Nonscarring hair loss, unspecified: Secondary | ICD-10-CM | POA: Diagnosis not present

## 2012-12-22 DIAGNOSIS — R5383 Other fatigue: Secondary | ICD-10-CM | POA: Diagnosis not present

## 2012-12-22 DIAGNOSIS — Z79899 Other long term (current) drug therapy: Secondary | ICD-10-CM | POA: Diagnosis not present

## 2012-12-22 DIAGNOSIS — R5381 Other malaise: Secondary | ICD-10-CM | POA: Diagnosis not present

## 2012-12-22 DIAGNOSIS — Z87898 Personal history of other specified conditions: Secondary | ICD-10-CM | POA: Diagnosis not present

## 2012-12-26 DIAGNOSIS — Z85820 Personal history of malignant melanoma of skin: Secondary | ICD-10-CM | POA: Diagnosis not present

## 2012-12-26 DIAGNOSIS — L659 Nonscarring hair loss, unspecified: Secondary | ICD-10-CM | POA: Diagnosis not present

## 2012-12-26 DIAGNOSIS — Z87898 Personal history of other specified conditions: Secondary | ICD-10-CM | POA: Diagnosis not present

## 2012-12-26 DIAGNOSIS — R5381 Other malaise: Secondary | ICD-10-CM | POA: Diagnosis not present

## 2012-12-26 DIAGNOSIS — D509 Iron deficiency anemia, unspecified: Secondary | ICD-10-CM | POA: Diagnosis not present

## 2012-12-26 DIAGNOSIS — I890 Lymphedema, not elsewhere classified: Secondary | ICD-10-CM | POA: Diagnosis not present

## 2013-01-04 DIAGNOSIS — M810 Age-related osteoporosis without current pathological fracture: Secondary | ICD-10-CM | POA: Diagnosis not present

## 2013-01-04 DIAGNOSIS — E785 Hyperlipidemia, unspecified: Secondary | ICD-10-CM | POA: Diagnosis not present

## 2013-01-04 DIAGNOSIS — E039 Hypothyroidism, unspecified: Secondary | ICD-10-CM | POA: Diagnosis not present

## 2013-01-04 DIAGNOSIS — E119 Type 2 diabetes mellitus without complications: Secondary | ICD-10-CM | POA: Diagnosis not present

## 2013-01-04 DIAGNOSIS — Z0000 Encounter for general adult medical examination without abnormal findings: Secondary | ICD-10-CM | POA: Diagnosis not present

## 2013-01-08 ENCOUNTER — Telehealth: Payer: Self-pay | Admitting: *Deleted

## 2013-01-08 NOTE — Telephone Encounter (Signed)
Misty Stanley called back, she is faxing over order form

## 2013-01-08 NOTE — Telephone Encounter (Signed)
I would  Need to see the order.

## 2013-01-08 NOTE — Telephone Encounter (Signed)
Tried calling Misty Stanley ([facility]) back to see if she can fax over the Order form, had to leave a voice mail for her to call me back

## 2013-01-08 NOTE — Telephone Encounter (Signed)
[facility] form placed in Dr.walkers folder

## 2013-01-08 NOTE — Telephone Encounter (Signed)
Lisa from [facility] called saying that there was no Dx codes written on the order from if someone can please call her back to give her the dx code  [phone]

## 2013-01-08 NOTE — Telephone Encounter (Signed)
Lab work from 01/04/2013 showed normal blood counts, stable kidney function and liver function. Normal cholesterol. Normal thyroid function. Normal vitamin D.

## 2013-01-09 ENCOUNTER — Ambulatory Visit: Payer: Self-pay | Admitting: Oncology

## 2013-01-09 ENCOUNTER — Other Ambulatory Visit: Payer: Medicare Other

## 2013-01-09 NOTE — Telephone Encounter (Signed)
Patient informed and verbalized understanding

## 2013-01-22 ENCOUNTER — Encounter: Payer: Self-pay | Admitting: Internal Medicine

## 2013-01-24 ENCOUNTER — Other Ambulatory Visit: Payer: Self-pay | Admitting: *Deleted

## 2013-01-24 DIAGNOSIS — E039 Hypothyroidism, unspecified: Secondary | ICD-10-CM

## 2013-01-24 MED ORDER — CITALOPRAM HYDROBROMIDE 20 MG PO TABS: ORAL_TABLET | ORAL | Status: DC

## 2013-01-24 MED ORDER — OMEPRAZOLE 20 MG PO CPDR: DELAYED_RELEASE_CAPSULE | ORAL | Status: DC

## 2013-01-24 MED ORDER — LEVOTHYROXINE SODIUM 100 MCG PO TABS: 100.0000 ug | ORAL_TABLET | Freq: Every day | ORAL | Status: DC

## 2013-01-24 MED ORDER — PRAVASTATIN SODIUM 40 MG PO TABS: ORAL_TABLET | ORAL | Status: DC

## 2013-01-24 NOTE — Telephone Encounter (Signed)
Spoke with pt, insurance and pharmacy have changed to [facility]. Requests Rxs to be sent to [facility]. Rx sent to pharmacy by escript.

## 2013-03-19 ENCOUNTER — Encounter: Payer: Self-pay | Admitting: Internal Medicine

## 2013-05-07 ENCOUNTER — Telehealth: Payer: Self-pay | Admitting: Internal Medicine

## 2013-05-07 NOTE — Telephone Encounter (Signed)
Fwd to Dr. Walker 

## 2013-05-07 NOTE — Telephone Encounter (Signed)
Patient works at [employer] and would like to pick up the order for her lab work before her appointment next week. She would like to pick this up tomorrow.

## 2013-05-08 ENCOUNTER — Telehealth: Payer: Self-pay | Admitting: Internal Medicine

## 2013-05-08 NOTE — Telephone Encounter (Signed)
Pt called, asked if we could fax [facility] slip to fx# [phone].  Faxed.

## 2013-05-08 NOTE — Telephone Encounter (Signed)
Patient informed form up front for pick up. She will come by to pick it up

## 2013-05-09 DIAGNOSIS — Z0000 Encounter for general adult medical examination without abnormal findings: Secondary | ICD-10-CM | POA: Diagnosis not present

## 2013-05-09 NOTE — Telephone Encounter (Signed)
Noted and thank you

## 2013-05-10 ENCOUNTER — Telehealth: Payer: Self-pay | Admitting: Internal Medicine

## 2013-05-10 NOTE — Telephone Encounter (Signed)
Labs from 10/29 were fine except for mild elevation of potassium. I would recommend that we repeat BMP today.

## 2013-05-14 ENCOUNTER — Other Ambulatory Visit: Payer: Self-pay | Admitting: Internal Medicine

## 2013-05-14 DIAGNOSIS — E875 Hyperkalemia: Secondary | ICD-10-CM | POA: Diagnosis not present

## 2013-05-14 DIAGNOSIS — I10 Essential (primary) hypertension: Secondary | ICD-10-CM | POA: Diagnosis not present

## 2013-05-14 NOTE — Telephone Encounter (Signed)
Order on your desk

## 2013-05-14 NOTE — Telephone Encounter (Signed)
Patient aware, will come by to pick up

## 2013-05-14 NOTE — Telephone Encounter (Signed)
Patient would like her labs done at [facility]. Please complete requisition for her to pick up.

## 2013-05-15 LAB — BASIC METABOLIC PANEL
BUN/Creatinine Ratio: 14 (ref 11–26)
BUN: 12 mg/dL (ref 8–27)
CO2: 26 mmol/L (ref 18–29)
Calcium: 9.8 mg/dL (ref 8.6–10.2)
Chloride: 97 mmol/L (ref 97–108)
Creatinine, Ser: 0.88 mg/dL (ref 0.57–1.00)
GFR calc Af Amer: 79 mL/min/{1.73_m2} (ref >59)
GFR calc non Af Amer: 68 mL/min/{1.73_m2} (ref >59)
Glucose: 136 mg/dL — ABNORMAL HIGH (ref 65–99)
Potassium: 5.2 mmol/L (ref 3.5–5.2)
Sodium: 140 mmol/L (ref 134–144)

## 2013-05-16 ENCOUNTER — Ambulatory Visit (INDEPENDENT_AMBULATORY_CARE_PROVIDER_SITE_OTHER): Payer: Medicare Other | Admitting: Internal Medicine

## 2013-05-16 ENCOUNTER — Encounter: Payer: Self-pay | Admitting: Internal Medicine

## 2013-05-16 VITALS — BP 100/50 | HR 62 | Temp 98.2°F | Ht 61.75 in | Wt 209.0 lb

## 2013-05-16 DIAGNOSIS — B3789 Other sites of candidiasis: Secondary | ICD-10-CM | POA: Insufficient documentation

## 2013-05-16 DIAGNOSIS — Z0000 Encounter for general adult medical examination without abnormal findings: Secondary | ICD-10-CM

## 2013-05-16 DIAGNOSIS — Z86010 Personal history of colonic polyps: Secondary | ICD-10-CM | POA: Diagnosis not present

## 2013-05-16 DIAGNOSIS — Z860101 Personal history of adenomatous and serrated colon polyps: Secondary | ICD-10-CM

## 2013-05-16 MED ORDER — NYSTATIN-TRIAMCINOLONE 100000-0.1 UNIT/GM-% EX OINT: 1.0000 "application " | TOPICAL_OINTMENT | Freq: Two times a day (BID) | CUTANEOUS | Status: DC

## 2013-05-16 MED ORDER — NYSTATIN 100000 UNIT/GM EX POWD: CUTANEOUS | Status: DC

## 2013-05-16 NOTE — Progress Notes (Signed)
Pre-visit discussion using our clinic review tool. No additional management support is needed unless otherwise documented below in the visit note.  

## 2013-05-16 NOTE — Assessment & Plan Note (Signed)
General medical exam including breast exam normal today except as noted. Pap deferred because of age and status post hysterectomy. Other health maintenance is up to date. Immunizations are up-to-date. Patient recently had lab work and we will request this today. Mammogram pending for end of 05/2013. Followup in 6 months and prn.

## 2013-05-16 NOTE — Progress Notes (Signed)
Subjective:    Patient ID: Janet Rice, female    DOB: Dec 14, 1945, 67 y.o.   MRN: [id number]  HPI The patient is here for annual Medicare wellness examination and management of other chronic and acute problems.   The risk factors are reflected in the social history.  The roster of all physicians providing medical care to patient - is listed in the Snapshot section of the chart.  Activities of daily living:  The patient is 100% independent in all ADLs: dressing, toileting, feeding as well as independent mobility  Home safety : The patient has smoke detectors in the home. They wear seatbelts.  There are no firearms at home. There is no violence in the home.   There is no risks for hepatitis, STDs or HIV. There is no history of blood transfusion. They have no travel history to infectious disease endemic areas of the world.  The patient has seen their dentist in the last six month. (Dentist - Dr. Axel Rice) They have seen their eye doctor in the last year. (JanetMatsuo - pt daughter) No hearing issues. They have deferred audiologic testing in the last year.    Dermatologist - [facility] - Janet Rice. They do not  have excessive sun exposure. Discussed the need for sun protection: hats, long sleeves and use of sunscreen if there is significant sun exposure.   Diet: the importance of a healthy diet is discussed. They do have a relatively healthy diet.  The benefits of regular aerobic exercise were discussed. She walks daily 1 mile and gardens.  Depression screen: there are no signs or vegative symptoms of depression- irritability, change in appetite, anhedonia, sadness/tearfullness.  Cognitive assessment: the patient manages all their financial and personal affairs and is actively engaged. They could relate day,date,year and events.  HCPOA - husband, Janet Rice, then daughter Janet Rice  The following portions of the patient's history were reviewed and updated as appropriate:  allergies, current medications, past family history, past medical history,  past surgical history, past social history  and problem list.  Visual acuity was not assessed per patient preference since she has regular follow up with her ophthalmologist. Hearing and body mass index were assessed and reviewed.   During the course of the visit the patient was educated and counseled about appropriate screening and preventive services including : fall prevention , diabetes screening, nutrition counseling, colorectal cancer screening, and recommended immunizations.     Outpatient Encounter Prescriptions as of 05/16/2013  Medication Sig  . citalopram (CELEXA) 20 MG tablet TAKE ONE TABLET BY MOUTH EVERY DAY  . levothyroxine (SYNTHROID, LEVOTHROID) 100 MCG tablet Take 1 tablet (100 mcg total) by mouth daily.  . Misc Natural Products (GLUCOSAMINE CHOND COMPLEX/MSM) TABS Take by mouth.  . Multiple Vitamin (MULTIVITAMIN) tablet Take 1 tablet by mouth daily.    Marland Kitchen omeprazole (PRILOSEC) 20 MG capsule TAKE 1 CAPSULE BY MOUTH DAILY  . pravastatin (PRAVACHOL) 40 MG tablet TAKE ONE-HALF TABLET BY MOUTH EVERY DAY  . ergocalciferol (DRISDOL) 50000 UNITS capsule Take 1 capsule (50,000 Units total) by mouth once a week.  . nystatin (MYCOSTATIN/NYSTOP) 100000 UNIT/GM POWD Apply a small amount under breast twice daily as needed.  . nystatin-triamcinolone ointment (MYCOLOG) Apply 1 application topically 2 (two) times daily.   BP 100/50  Pulse 62  Temp(Src) 98.2 F (36.8 C) (Oral)  Ht 5' 1.75" (1.568 m)  Wt 209 lb (94.802 kg)  BMI 38.56 kg/m2  SpO2 97%   Review of Systems  Constitutional: Negative  for fever, chills, appetite change, fatigue and unexpected weight change.  HENT: Negative for congestion, ear pain, sinus pressure, sore throat, trouble swallowing and voice change.   Eyes: Negative for visual disturbance.  Respiratory: Negative for cough, shortness of breath, wheezing and stridor.   Cardiovascular:  Negative for chest pain, palpitations and leg swelling.  Gastrointestinal: Negative for nausea, vomiting, abdominal pain, diarrhea, constipation, blood in stool, abdominal distention and anal bleeding.  Genitourinary: Negative for dysuria and flank pain.  Musculoskeletal: Negative for arthralgias, gait problem, myalgias and neck pain.  Skin: Negative for color change and rash.  Neurological: Negative for dizziness and headaches.  Hematological: Negative for adenopathy. Does not bruise/bleed easily.  Psychiatric/Behavioral: Negative for suicidal ideas, sleep disturbance and dysphoric mood. The patient is not nervous/anxious.        Objective:   Physical Exam  Constitutional: She is oriented to person, place, and time. She appears well-developed and well-nourished. No distress.  HENT:  Head: Normocephalic and atraumatic.  Right Ear: External ear normal.  Left Ear: External ear normal.  Nose: Nose normal.  Mouth/Throat: Oropharynx is clear and moist. No oropharyngeal exudate.  Eyes: Conjunctivae are normal. Pupils are equal, round, and reactive to light. Right eye exhibits no discharge. Left eye exhibits no discharge. No scleral icterus.  Neck: Normal range of motion. Neck supple. No tracheal deviation present. No thyromegaly present.  Cardiovascular: Normal rate, regular rhythm, normal heart sounds and intact distal pulses.  Exam reveals no gallop and no friction rub.   No murmur heard. Pulmonary/Chest: Effort normal and breath sounds normal. No accessory muscle usage. Not tachypneic. No respiratory distress. She has no decreased breath sounds. She has no wheezes. She has no rales. She exhibits no tenderness. Right breast exhibits no inverted nipple, no mass, no nipple discharge, no skin change and no tenderness. Left breast exhibits skin change. Left breast exhibits no inverted nipple, no mass, no nipple discharge and no tenderness. Breasts are symmetrical.    Abdominal: Soft. Bowel sounds  are normal. She exhibits no distension and no mass. There is no tenderness. There is no rebound and no guarding.  Musculoskeletal: Normal range of motion. She exhibits no edema and no tenderness.  Lymphadenopathy:    She has no cervical adenopathy.  Neurological: She is alert and oriented to person, place, and time. No cranial nerve deficit. She exhibits normal muscle tone. Coordination normal.  Skin: Skin is warm and dry. No rash noted. She is not diaphoretic. No erythema. No pallor.  Psychiatric: She has a normal mood and affect. Her behavior is normal. Judgment and thought content normal.          Assessment & Plan:

## 2013-05-16 NOTE — Assessment & Plan Note (Signed)
Examination is consistent with superficial candidiasis under the left breast. Will treat with topical nystatin/triamcinolone ointment and nystatin powder. Patient will call if symptoms are not improving.

## 2013-05-16 NOTE — Assessment & Plan Note (Signed)
Referral for repeat colonoscopy placed today.

## 2013-05-28 ENCOUNTER — Encounter: Payer: Self-pay | Admitting: Internal Medicine

## 2013-05-31 ENCOUNTER — Encounter: Payer: Self-pay | Admitting: Internal Medicine

## 2013-06-04 ENCOUNTER — Ambulatory Visit: Payer: Medicare Other | Admitting: Internal Medicine

## 2013-06-11 ENCOUNTER — Telehealth: Payer: Self-pay | Admitting: *Deleted

## 2013-06-11 NOTE — Telephone Encounter (Signed)
Patient left message on voicemail stating she has some concerns about the billing for her visit on 05/16/13 with Dr. Dan Humphreys. Stated something must not have been coded correctly because her insurance did not pay anything, BCBS or Medicare. Returned patient call, no answer so left message to call back.

## 2013-06-12 NOTE — Telephone Encounter (Signed)
Please have EKG charge corrected .  Thanks Darl Pikes  I called and spoke with patient to let her know that this charge would be re-filed.

## 2013-06-12 NOTE — Telephone Encounter (Signed)
Received a bill stating no medical payment for the ECG she received on 05/16/13 by Dr. Dan Humphreys. Insurance payment from [employer] and [employer]BS no payment. Could you please assist with this. The bill is for $78

## 2013-06-12 NOTE — Telephone Encounter (Signed)
I have sent a message to Darl Pikes to check on the billing.

## 2013-06-14 ENCOUNTER — Ambulatory Visit: Payer: Self-pay | Admitting: Internal Medicine

## 2013-06-14 DIAGNOSIS — Z1231 Encounter for screening mammogram for malignant neoplasm of breast: Secondary | ICD-10-CM | POA: Diagnosis not present

## 2013-06-21 DIAGNOSIS — D649 Anemia, unspecified: Secondary | ICD-10-CM | POA: Diagnosis not present

## 2013-06-21 DIAGNOSIS — C439 Malignant melanoma of skin, unspecified: Secondary | ICD-10-CM | POA: Diagnosis not present

## 2013-06-25 ENCOUNTER — Inpatient Hospital Stay: Payer: Self-pay | Admitting: Orthopedic Surgery

## 2013-06-25 DIAGNOSIS — M25579 Pain in unspecified ankle and joints of unspecified foot: Secondary | ICD-10-CM | POA: Diagnosis not present

## 2013-06-25 DIAGNOSIS — S82843A Displaced bimalleolar fracture of unspecified lower leg, initial encounter for closed fracture: Secondary | ICD-10-CM | POA: Diagnosis not present

## 2013-06-25 DIAGNOSIS — F329 Major depressive disorder, single episode, unspecified: Secondary | ICD-10-CM | POA: Diagnosis present

## 2013-06-25 DIAGNOSIS — M810 Age-related osteoporosis without current pathological fracture: Secondary | ICD-10-CM | POA: Diagnosis present

## 2013-06-25 DIAGNOSIS — Z6839 Body mass index (BMI) 39.0-39.9, adult: Secondary | ICD-10-CM | POA: Diagnosis not present

## 2013-06-25 DIAGNOSIS — E669 Obesity, unspecified: Secondary | ICD-10-CM | POA: Diagnosis present

## 2013-06-25 DIAGNOSIS — K219 Gastro-esophageal reflux disease without esophagitis: Secondary | ICD-10-CM | POA: Diagnosis not present

## 2013-06-25 DIAGNOSIS — Z4789 Encounter for other orthopedic aftercare: Secondary | ICD-10-CM | POA: Diagnosis not present

## 2013-06-25 DIAGNOSIS — S82209A Unspecified fracture of shaft of unspecified tibia, initial encounter for closed fracture: Secondary | ICD-10-CM | POA: Diagnosis not present

## 2013-06-25 DIAGNOSIS — S1093XA Contusion of unspecified part of neck, initial encounter: Secondary | ICD-10-CM | POA: Diagnosis not present

## 2013-06-25 DIAGNOSIS — E785 Hyperlipidemia, unspecified: Secondary | ICD-10-CM | POA: Diagnosis not present

## 2013-06-25 DIAGNOSIS — S82899A Other fracture of unspecified lower leg, initial encounter for closed fracture: Secondary | ICD-10-CM | POA: Diagnosis not present

## 2013-06-25 DIAGNOSIS — M1990 Unspecified osteoarthritis, unspecified site: Secondary | ICD-10-CM | POA: Diagnosis present

## 2013-06-25 DIAGNOSIS — E039 Hypothyroidism, unspecified: Secondary | ICD-10-CM | POA: Diagnosis not present

## 2013-06-25 DIAGNOSIS — I10 Essential (primary) hypertension: Secondary | ICD-10-CM | POA: Diagnosis not present

## 2013-06-25 DIAGNOSIS — S99919A Unspecified injury of unspecified ankle, initial encounter: Secondary | ICD-10-CM | POA: Diagnosis not present

## 2013-06-25 DIAGNOSIS — S8990XA Unspecified injury of unspecified lower leg, initial encounter: Secondary | ICD-10-CM | POA: Diagnosis not present

## 2013-06-25 DIAGNOSIS — S9306XA Dislocation of unspecified ankle joint, initial encounter: Secondary | ICD-10-CM | POA: Diagnosis not present

## 2013-06-25 DIAGNOSIS — S0003XA Contusion of scalp, initial encounter: Secondary | ICD-10-CM | POA: Diagnosis not present

## 2013-06-25 DIAGNOSIS — M7989 Other specified soft tissue disorders: Secondary | ICD-10-CM | POA: Diagnosis not present

## 2013-06-25 DIAGNOSIS — F3289 Other specified depressive episodes: Secondary | ICD-10-CM | POA: Diagnosis present

## 2013-06-25 DIAGNOSIS — S0083XA Contusion of other part of head, initial encounter: Secondary | ICD-10-CM | POA: Diagnosis present

## 2013-06-25 DIAGNOSIS — Z85820 Personal history of malignant melanoma of skin: Secondary | ICD-10-CM | POA: Diagnosis not present

## 2013-06-25 DIAGNOSIS — Z01810 Encounter for preprocedural cardiovascular examination: Secondary | ICD-10-CM | POA: Diagnosis not present

## 2013-06-25 DIAGNOSIS — S8263XA Displaced fracture of lateral malleolus of unspecified fibula, initial encounter for closed fracture: Secondary | ICD-10-CM | POA: Diagnosis not present

## 2013-06-25 LAB — BASIC METABOLIC PANEL
Anion Gap: 6 — ABNORMAL LOW (ref 7–16)
BUN: 9 mg/dL (ref 7–18)
Calcium, Total: 9.6 mg/dL (ref 8.5–10.1)
Chloride: 104 mmol/L (ref 98–107)
Co2: 27 mmol/L (ref 21–32)
Creatinine: 1.05 mg/dL (ref 0.60–1.30)
EGFR (African American): >60
EGFR (Non-African Amer.): 55 — ABNORMAL LOW
Glucose: 93 mg/dL (ref 65–99)
Osmolality: 272 (ref 275–301)
Potassium: 3.9 mmol/L (ref 3.5–5.1)
Sodium: 137 mmol/L (ref 136–145)

## 2013-06-25 LAB — CBC
HCT: 32.7 % — ABNORMAL LOW (ref 35.0–47.0)
HGB: 10.3 g/dL — ABNORMAL LOW (ref 12.0–16.0)
MCH: 21.9 pg — ABNORMAL LOW (ref 26.0–34.0)
MCHC: 31.4 g/dL — ABNORMAL LOW (ref 32.0–36.0)
MCV: 70 fL — ABNORMAL LOW (ref 80–100)
Platelet: 158 10*3/uL (ref 150–440)
RBC: 4.69 10*6/uL (ref 3.80–5.20)
RDW: 17 % — ABNORMAL HIGH (ref 11.5–14.5)
WBC: 14.9 10*3/uL — ABNORMAL HIGH (ref 3.6–11.0)

## 2013-07-09 ENCOUNTER — Encounter: Payer: Self-pay | Admitting: Internal Medicine

## 2013-07-10 ENCOUNTER — Ambulatory Visit: Payer: Self-pay | Admitting: Orthopedic Surgery

## 2013-07-10 DIAGNOSIS — K219 Gastro-esophageal reflux disease without esophagitis: Secondary | ICD-10-CM | POA: Diagnosis not present

## 2013-07-10 DIAGNOSIS — S82843A Displaced bimalleolar fracture of unspecified lower leg, initial encounter for closed fracture: Secondary | ICD-10-CM | POA: Diagnosis not present

## 2013-07-10 DIAGNOSIS — Z9089 Acquired absence of other organs: Secondary | ICD-10-CM | POA: Diagnosis not present

## 2013-07-10 DIAGNOSIS — Z01812 Encounter for preprocedural laboratory examination: Secondary | ICD-10-CM | POA: Diagnosis not present

## 2013-07-10 DIAGNOSIS — Z9889 Other specified postprocedural states: Secondary | ICD-10-CM | POA: Diagnosis not present

## 2013-07-10 DIAGNOSIS — M129 Arthropathy, unspecified: Secondary | ICD-10-CM | POA: Diagnosis not present

## 2013-07-10 DIAGNOSIS — Z79899 Other long term (current) drug therapy: Secondary | ICD-10-CM | POA: Diagnosis not present

## 2013-07-10 DIAGNOSIS — Z85820 Personal history of malignant melanoma of skin: Secondary | ICD-10-CM | POA: Diagnosis not present

## 2013-07-10 DIAGNOSIS — Z882 Allergy status to sulfonamides status: Secondary | ICD-10-CM | POA: Diagnosis not present

## 2013-07-10 DIAGNOSIS — E0789 Other specified disorders of thyroid: Secondary | ICD-10-CM | POA: Diagnosis not present

## 2013-07-10 DIAGNOSIS — M4800 Spinal stenosis, site unspecified: Secondary | ICD-10-CM | POA: Diagnosis not present

## 2013-07-10 DIAGNOSIS — Z90710 Acquired absence of both cervix and uterus: Secondary | ICD-10-CM | POA: Diagnosis not present

## 2013-07-10 DIAGNOSIS — E669 Obesity, unspecified: Secondary | ICD-10-CM | POA: Diagnosis not present

## 2013-07-10 LAB — PROTIME-INR
INR: 1
Prothrombin Time: 13.7 secs (ref 11.5–14.7)

## 2013-07-10 LAB — URINALYSIS, COMPLETE
Bilirubin,UR: NEGATIVE
Blood: NEGATIVE
Glucose,UR: NEGATIVE mg/dL (ref 0–75)
Ketone: NEGATIVE
Nitrite: NEGATIVE
Ph: 5 (ref 4.5–8.0)
Protein: NEGATIVE
RBC,UR: 2 /HPF (ref 0–5)
Specific Gravity: 1.017 (ref 1.003–1.030)
Squamous Epithelial: 8
WBC UR: 23 /HPF (ref 0–5)

## 2013-07-10 LAB — CBC
HCT: 31.4 % — ABNORMAL LOW (ref 35.0–47.0)
HGB: 9.8 g/dL — ABNORMAL LOW (ref 12.0–16.0)
MCH: 22 pg — ABNORMAL LOW (ref 26.0–34.0)
MCHC: 31.4 g/dL — ABNORMAL LOW (ref 32.0–36.0)
MCV: 70 fL — ABNORMAL LOW (ref 80–100)
Platelet: 239 10*3/uL (ref 150–440)
RBC: 4.47 10*6/uL (ref 3.80–5.20)
RDW: 17.3 % — ABNORMAL HIGH (ref 11.5–14.5)
WBC: 7.6 10*3/uL (ref 3.6–11.0)

## 2013-07-10 LAB — BASIC METABOLIC PANEL
Anion Gap: 2 — ABNORMAL LOW (ref 7–16)
BUN: 13 mg/dL (ref 7–18)
Calcium, Total: 9.4 mg/dL (ref 8.5–10.1)
Chloride: 101 mmol/L (ref 98–107)
Co2: 31 mmol/L (ref 21–32)
Creatinine: 0.95 mg/dL (ref 0.60–1.30)
EGFR (African American): >60
EGFR (Non-African Amer.): >60
Glucose: 81 mg/dL (ref 65–99)
Osmolality: 267 (ref 275–301)
Potassium: 4.2 mmol/L (ref 3.5–5.1)
Sodium: 134 mmol/L — ABNORMAL LOW (ref 136–145)

## 2013-07-10 LAB — APTT: Activated PTT: 34.3 secs (ref 23.6–35.9)

## 2013-07-11 ENCOUNTER — Ambulatory Visit: Payer: Self-pay | Admitting: Orthopedic Surgery

## 2013-07-11 DIAGNOSIS — S82899A Other fracture of unspecified lower leg, initial encounter for closed fracture: Secondary | ICD-10-CM | POA: Diagnosis not present

## 2013-07-11 DIAGNOSIS — Z882 Allergy status to sulfonamides status: Secondary | ICD-10-CM | POA: Diagnosis not present

## 2013-07-11 DIAGNOSIS — M4800 Spinal stenosis, site unspecified: Secondary | ICD-10-CM | POA: Diagnosis not present

## 2013-07-11 DIAGNOSIS — S82853A Displaced trimalleolar fracture of unspecified lower leg, initial encounter for closed fracture: Secondary | ICD-10-CM | POA: Diagnosis not present

## 2013-07-11 DIAGNOSIS — Z9089 Acquired absence of other organs: Secondary | ICD-10-CM | POA: Diagnosis not present

## 2013-07-11 DIAGNOSIS — M129 Arthropathy, unspecified: Secondary | ICD-10-CM | POA: Diagnosis not present

## 2013-07-11 DIAGNOSIS — Z90710 Acquired absence of both cervix and uterus: Secondary | ICD-10-CM | POA: Diagnosis not present

## 2013-07-11 DIAGNOSIS — K219 Gastro-esophageal reflux disease without esophagitis: Secondary | ICD-10-CM | POA: Diagnosis not present

## 2013-07-11 DIAGNOSIS — E669 Obesity, unspecified: Secondary | ICD-10-CM | POA: Diagnosis not present

## 2013-07-11 DIAGNOSIS — Z85820 Personal history of malignant melanoma of skin: Secondary | ICD-10-CM | POA: Diagnosis not present

## 2013-07-11 DIAGNOSIS — Z87891 Personal history of nicotine dependence: Secondary | ICD-10-CM | POA: Diagnosis not present

## 2013-07-11 DIAGNOSIS — IMO0002 Reserved for concepts with insufficient information to code with codable children: Secondary | ICD-10-CM | POA: Diagnosis not present

## 2013-07-11 DIAGNOSIS — Z79899 Other long term (current) drug therapy: Secondary | ICD-10-CM | POA: Diagnosis not present

## 2013-07-12 DIAGNOSIS — M129 Arthropathy, unspecified: Secondary | ICD-10-CM | POA: Diagnosis not present

## 2013-07-12 DIAGNOSIS — Z9089 Acquired absence of other organs: Secondary | ICD-10-CM | POA: Diagnosis not present

## 2013-07-12 DIAGNOSIS — S82853A Displaced trimalleolar fracture of unspecified lower leg, initial encounter for closed fracture: Secondary | ICD-10-CM | POA: Diagnosis not present

## 2013-07-12 DIAGNOSIS — K219 Gastro-esophageal reflux disease without esophagitis: Secondary | ICD-10-CM | POA: Diagnosis not present

## 2013-07-12 DIAGNOSIS — Z882 Allergy status to sulfonamides status: Secondary | ICD-10-CM | POA: Diagnosis not present

## 2013-07-12 DIAGNOSIS — M4800 Spinal stenosis, site unspecified: Secondary | ICD-10-CM | POA: Diagnosis not present

## 2013-07-12 DIAGNOSIS — Z79899 Other long term (current) drug therapy: Secondary | ICD-10-CM | POA: Diagnosis not present

## 2013-07-12 DIAGNOSIS — E669 Obesity, unspecified: Secondary | ICD-10-CM | POA: Diagnosis not present

## 2013-07-12 DIAGNOSIS — Z90710 Acquired absence of both cervix and uterus: Secondary | ICD-10-CM | POA: Diagnosis not present

## 2013-07-12 DIAGNOSIS — Z87891 Personal history of nicotine dependence: Secondary | ICD-10-CM | POA: Diagnosis not present

## 2013-07-12 DIAGNOSIS — Z85820 Personal history of malignant melanoma of skin: Secondary | ICD-10-CM | POA: Diagnosis not present

## 2013-07-12 LAB — CBC WITH DIFFERENTIAL/PLATELET
Basophil #: 0 10*3/uL (ref 0.0–0.1)
Basophil %: 0.5 %
Eosinophil #: 0.1 10*3/uL (ref 0.0–0.7)
Eosinophil %: 0.8 %
HCT: 26.1 % — ABNORMAL LOW (ref 35.0–47.0)
HGB: 8.2 g/dL — ABNORMAL LOW (ref 12.0–16.0)
Lymphocyte #: 0.9 10*3/uL — ABNORMAL LOW (ref 1.0–3.6)
Lymphocyte %: 10.5 %
MCH: 21.9 pg — ABNORMAL LOW (ref 26.0–34.0)
MCHC: 31.4 g/dL — ABNORMAL LOW (ref 32.0–36.0)
MCV: 70 fL — ABNORMAL LOW (ref 80–100)
Monocyte #: 0.8 x10 3/mm (ref 0.2–0.9)
Monocyte %: 9.1 %
Neutrophil #: 7.1 10*3/uL — ABNORMAL HIGH (ref 1.4–6.5)
Neutrophil %: 79.1 %
Platelet: 179 10*3/uL (ref 150–440)
RBC: 3.74 10*6/uL — ABNORMAL LOW (ref 3.80–5.20)
RDW: 17.5 % — ABNORMAL HIGH (ref 11.5–14.5)
WBC: 9 10*3/uL (ref 3.6–11.0)

## 2013-07-12 LAB — BASIC METABOLIC PANEL
Anion Gap: 3 — ABNORMAL LOW (ref 7–16)
BUN: 9 mg/dL (ref 7–18)
Calcium, Total: 8.2 mg/dL — ABNORMAL LOW (ref 8.5–10.1)
Chloride: 103 mmol/L (ref 98–107)
Co2: 31 mmol/L (ref 21–32)
Creatinine: 1.1 mg/dL (ref 0.60–1.30)
EGFR (African American): >60
EGFR (Non-African Amer.): 52 — ABNORMAL LOW
Glucose: 137 mg/dL — ABNORMAL HIGH (ref 65–99)
Osmolality: 275 (ref 275–301)
Potassium: 4.2 mmol/L (ref 3.5–5.1)
Sodium: 137 mmol/L (ref 136–145)

## 2013-07-14 ENCOUNTER — Other Ambulatory Visit: Payer: Self-pay | Admitting: Internal Medicine

## 2013-07-18 ENCOUNTER — Other Ambulatory Visit: Payer: Self-pay | Admitting: Internal Medicine

## 2013-07-18 ENCOUNTER — Encounter: Payer: Self-pay | Admitting: Internal Medicine

## 2013-07-23 DIAGNOSIS — S82853A Displaced trimalleolar fracture of unspecified lower leg, initial encounter for closed fracture: Secondary | ICD-10-CM | POA: Diagnosis not present

## 2013-07-24 ENCOUNTER — Telehealth: Payer: Self-pay | Admitting: *Deleted

## 2013-07-24 NOTE — Telephone Encounter (Signed)
Patient left a message stating she received a bill about some labs she had done in the office on 10/29. BCBS will not cover it and she does not see where it was filed with Medicare first. Would like someone from billing to call her in reference to this.

## 2013-07-26 NOTE — Telephone Encounter (Signed)
I called patient and gave her the billing department number so she can contact them directly.

## 2013-08-01 ENCOUNTER — Other Ambulatory Visit: Payer: Self-pay | Admitting: *Deleted

## 2013-08-01 DIAGNOSIS — E039 Hypothyroidism, unspecified: Secondary | ICD-10-CM

## 2013-08-01 MED ORDER — LEVOTHYROXINE SODIUM 100 MCG PO TABS: 100.0000 ug | ORAL_TABLET | Freq: Every day | ORAL | Status: DC

## 2013-08-06 DIAGNOSIS — S82853A Displaced trimalleolar fracture of unspecified lower leg, initial encounter for closed fracture: Secondary | ICD-10-CM | POA: Diagnosis not present

## 2013-08-30 DIAGNOSIS — S82853A Displaced trimalleolar fracture of unspecified lower leg, initial encounter for closed fracture: Secondary | ICD-10-CM | POA: Diagnosis not present

## 2013-09-07 DIAGNOSIS — S82843A Displaced bimalleolar fracture of unspecified lower leg, initial encounter for closed fracture: Secondary | ICD-10-CM | POA: Diagnosis not present

## 2013-09-07 DIAGNOSIS — S82853A Displaced trimalleolar fracture of unspecified lower leg, initial encounter for closed fracture: Secondary | ICD-10-CM | POA: Diagnosis not present

## 2013-09-07 DIAGNOSIS — M6281 Muscle weakness (generalized): Secondary | ICD-10-CM | POA: Diagnosis not present

## 2013-09-07 DIAGNOSIS — M25676 Stiffness of unspecified foot, not elsewhere classified: Secondary | ICD-10-CM | POA: Diagnosis not present

## 2013-09-07 DIAGNOSIS — M25673 Stiffness of unspecified ankle, not elsewhere classified: Secondary | ICD-10-CM | POA: Diagnosis not present

## 2013-09-18 DIAGNOSIS — S82843A Displaced bimalleolar fracture of unspecified lower leg, initial encounter for closed fracture: Secondary | ICD-10-CM | POA: Diagnosis not present

## 2013-09-18 DIAGNOSIS — S82853A Displaced trimalleolar fracture of unspecified lower leg, initial encounter for closed fracture: Secondary | ICD-10-CM | POA: Diagnosis not present

## 2013-09-18 DIAGNOSIS — R269 Unspecified abnormalities of gait and mobility: Secondary | ICD-10-CM | POA: Diagnosis not present

## 2013-09-18 DIAGNOSIS — M6281 Muscle weakness (generalized): Secondary | ICD-10-CM | POA: Diagnosis not present

## 2013-09-24 DIAGNOSIS — M6281 Muscle weakness (generalized): Secondary | ICD-10-CM | POA: Diagnosis not present

## 2013-09-24 DIAGNOSIS — S82843A Displaced bimalleolar fracture of unspecified lower leg, initial encounter for closed fracture: Secondary | ICD-10-CM | POA: Diagnosis not present

## 2013-09-26 DIAGNOSIS — Z85820 Personal history of malignant melanoma of skin: Secondary | ICD-10-CM | POA: Diagnosis not present

## 2013-09-26 DIAGNOSIS — D485 Neoplasm of uncertain behavior of skin: Secondary | ICD-10-CM | POA: Diagnosis not present

## 2013-09-26 DIAGNOSIS — D0430 Carcinoma in situ of skin of unspecified part of face: Secondary | ICD-10-CM | POA: Diagnosis not present

## 2013-09-26 DIAGNOSIS — D235 Other benign neoplasm of skin of trunk: Secondary | ICD-10-CM | POA: Diagnosis not present

## 2013-09-26 DIAGNOSIS — D0439 Carcinoma in situ of skin of other parts of face: Secondary | ICD-10-CM | POA: Diagnosis not present

## 2013-10-09 DIAGNOSIS — D0430 Carcinoma in situ of skin of unspecified part of face: Secondary | ICD-10-CM | POA: Diagnosis not present

## 2013-10-09 DIAGNOSIS — D0439 Carcinoma in situ of skin of other parts of face: Secondary | ICD-10-CM | POA: Diagnosis not present

## 2013-10-10 DIAGNOSIS — D0430 Carcinoma in situ of skin of unspecified part of face: Secondary | ICD-10-CM | POA: Diagnosis not present

## 2013-10-10 DIAGNOSIS — D0439 Carcinoma in situ of skin of other parts of face: Secondary | ICD-10-CM | POA: Diagnosis not present

## 2013-10-12 ENCOUNTER — Other Ambulatory Visit: Payer: Self-pay | Admitting: Internal Medicine

## 2013-10-29 ENCOUNTER — Encounter: Payer: Self-pay | Admitting: Internal Medicine

## 2013-11-07 ENCOUNTER — Telehealth: Payer: Self-pay | Admitting: Internal Medicine

## 2013-11-07 NOTE — Telephone Encounter (Signed)
Pt called she had an appointment 5/5 and wanted to get labs done prior to appointment  She is a [employer] employee and wanted to get her labs done at [employer]  She would like to pick up order Please let pt know when order will be ready

## 2013-11-12 NOTE — Telephone Encounter (Signed)
Information placed on your desk.

## 2013-11-12 NOTE — Telephone Encounter (Signed)
Left message, notifying pt lab order up front.

## 2013-11-13 ENCOUNTER — Encounter: Payer: Self-pay | Admitting: Internal Medicine

## 2013-11-13 ENCOUNTER — Ambulatory Visit (INDEPENDENT_AMBULATORY_CARE_PROVIDER_SITE_OTHER): Payer: Medicare Other | Admitting: Internal Medicine

## 2013-11-13 VITALS — BP 110/62 | HR 61 | Resp 16 | Wt 207.2 lb

## 2013-11-13 DIAGNOSIS — M48061 Spinal stenosis, lumbar region without neurogenic claudication: Secondary | ICD-10-CM

## 2013-11-13 DIAGNOSIS — G5600 Carpal tunnel syndrome, unspecified upper limb: Secondary | ICD-10-CM | POA: Diagnosis not present

## 2013-11-13 MED ORDER — TRAMADOL HCL 50 MG PO TABS: 50.0000 mg | ORAL_TABLET | Freq: Three times a day (TID) | ORAL | Status: DC | PRN

## 2013-11-13 NOTE — Assessment & Plan Note (Signed)
Recent numbness in both thumbs intermittently most c/w carpal tunnel. Recommended use of bilateral wrist braces. Follow up prn if symptoms persistent.

## 2013-11-13 NOTE — Progress Notes (Signed)
Pre visit review using our clinic review tool, if applicable. No additional management support is needed unless otherwise documented below in the visit note. 

## 2013-11-13 NOTE — Progress Notes (Signed)
Subjective:    Patient ID: Janet Rice, female    DOB: 09-25-1945, 68 y.o.   MRN: [id number]  HPI 68YO female presents for follow up.  [facility] Dec 16th off of ladder. Fractured right ankle. S/p surgical repair with ORIF by Dr. Mack Guise. Ankle healing well, but having significant aching pain in back and bilateral thighs over last few months. Described as aching. No improvement with Tylenol. Using Advil with caution because of concern about side effects. Has known h/o lumbar spinal stenosis, reviewed on MRI lumbar spine 2012.  Also concerned about numbness in bilateral thumbs which occurs intermittently. Uses a computer for work. Numbness improves with shaking hands. Has not tried any intervention for this.  Review of Systems  Constitutional: Negative for fever, chills, appetite change, fatigue and unexpected weight change.  HENT: Negative for congestion, ear pain, sinus pressure, sore throat, trouble swallowing and voice change.   Eyes: Negative for visual disturbance.  Respiratory: Negative for cough, shortness of breath, wheezing and stridor.   Cardiovascular: Negative for chest pain, palpitations and leg swelling.  Gastrointestinal: Negative for nausea, vomiting, abdominal pain, diarrhea, constipation, blood in stool, abdominal distention and anal bleeding.  Genitourinary: Negative for dysuria and flank pain.  Musculoskeletal: Positive for arthralgias, back pain and myalgias. Negative for gait problem and neck pain.  Skin: Negative for color change and rash.  Neurological: Positive for numbness. Negative for dizziness, weakness and headaches.  Hematological: Negative for adenopathy. Does not bruise/bleed easily.  Psychiatric/Behavioral: Negative for suicidal ideas, sleep disturbance and dysphoric mood. The patient is not nervous/anxious.        Objective:    BP 110/62  Pulse 61  Resp 16  Wt 207 lb 4 oz (94.008 kg)  SpO2 96% Physical Exam  Constitutional: She is oriented to  person, place, and time. She appears well-developed and well-nourished. No distress.  HENT:  Head: Normocephalic and atraumatic.  Right Ear: External ear normal.  Left Ear: External ear normal.  Nose: Nose normal.  Mouth/Throat: Oropharynx is clear and moist. No oropharyngeal exudate.  Eyes: Conjunctivae are normal. Pupils are equal, round, and reactive to light. Right eye exhibits no discharge. Left eye exhibits no discharge. No scleral icterus.  Neck: Normal range of motion. Neck supple. No tracheal deviation present. No thyromegaly present.  Cardiovascular: Normal rate, regular rhythm, normal heart sounds and intact distal pulses.  Exam reveals no gallop and no friction rub.   No murmur heard. Pulmonary/Chest: Effort normal and breath sounds normal. No accessory muscle usage. Not tachypneic. No respiratory distress. She has no decreased breath sounds. She has no wheezes. She has no rhonchi. She has no rales. She exhibits no tenderness.  Musculoskeletal: She exhibits no edema and no tenderness.       Lumbar back: She exhibits decreased range of motion and pain. She exhibits no tenderness and no bony tenderness.       Right foot: She exhibits swelling (trace over ankle). She exhibits normal range of motion, no tenderness and no bony tenderness.       Feet:  Lymphadenopathy:    She has no cervical adenopathy.  Neurological: She is alert and oriented to person, place, and time. No cranial nerve deficit. She exhibits normal muscle tone. Coordination normal.  Skin: Skin is warm and dry. No rash noted. She is not diaphoretic. No erythema. No pallor.  Psychiatric: She has a normal mood and affect. Her behavior is normal. Judgment and thought content normal.  Assessment & Plan:   Problem List Items Addressed This Visit   Carpal tunnel syndrome     Recent numbness in both thumbs intermittently most c/w carpal tunnel. Recommended use of bilateral wrist braces. Follow up prn if  symptoms persistent.    Spinal stenosis of lumbar region - Primary     Chronic low back pain recently worsened after prolonged immobilization with right ankle fracture. Reviewed MRI lumbar spine from 2012 which showed stenosis. Question if she might benefit from [facility]. Will set up evaluation with Dr. Sharlet Salina. Will start Tramadol 50-100mg  po tid prn in the interim.    Relevant Medications      traMADol (ULTRAM) tablet 50 mg   Other Relevant Orders      Ambulatory referral to Orthopedic Surgery       Return in about 4 weeks (around 12/11/2013) for Recheck.

## 2013-11-13 NOTE — Assessment & Plan Note (Signed)
Chronic low back pain recently worsened after prolonged immobilization with right ankle fracture. Reviewed MRI lumbar spine from 2012 which showed stenosis. Question if she might benefit from [facility]. Will set up evaluation with Dr. Sharlet Salina. Will start Tramadol 50-100mg  po tid prn in the interim.

## 2013-11-13 NOTE — Patient Instructions (Signed)
Start Tramadol 50 to 100mg  up to every 8 hours as needed for severe pain.  We will schedule an evaluation with Dr. Sharlet Salina.  Follow up 4 weeks.

## 2013-11-19 DIAGNOSIS — Z0000 Encounter for general adult medical examination without abnormal findings: Secondary | ICD-10-CM | POA: Diagnosis not present

## 2013-11-20 ENCOUNTER — Other Ambulatory Visit (INDEPENDENT_AMBULATORY_CARE_PROVIDER_SITE_OTHER): Payer: Medicare Other

## 2013-11-20 ENCOUNTER — Telehealth: Payer: Self-pay | Admitting: Internal Medicine

## 2013-11-20 DIAGNOSIS — D649 Anemia, unspecified: Secondary | ICD-10-CM

## 2013-11-20 DIAGNOSIS — E875 Hyperkalemia: Secondary | ICD-10-CM | POA: Diagnosis not present

## 2013-11-20 LAB — CBC WITH DIFFERENTIAL/PLATELET
Basophils Absolute: 0 10*3/uL (ref 0.0–0.1)
Basophils Relative: 0.3 % (ref 0.0–3.0)
Eosinophils Absolute: 0.1 10*3/uL (ref 0.0–0.7)
Eosinophils Relative: 2 % (ref 0.0–5.0)
HCT: 24.5 % — ABNORMAL LOW (ref 36.0–46.0)
Hemoglobin: 7.6 g/dL — CL (ref 12.0–15.0)
Lymphocytes Relative: 33.1 % (ref 12.0–46.0)
Lymphs Abs: 2.3 10*3/uL (ref 0.7–4.0)
MCHC: 30.9 g/dL (ref 30.0–36.0)
MCV: 62 fl — ABNORMAL LOW (ref 78.0–100.0)
Monocytes Absolute: 0.5 10*3/uL (ref 0.1–1.0)
Monocytes Relative: 6.9 % (ref 3.0–12.0)
Neutro Abs: 4.1 10*3/uL (ref 1.4–7.7)
Neutrophils Relative %: 57.7 % (ref 43.0–77.0)
Platelets: 157 10*3/uL (ref 150.0–400.0)
RBC: 3.95 Mil/uL (ref 3.87–5.11)
RDW: 17.7 % — ABNORMAL HIGH (ref 11.5–15.5)
WBC: 7.1 10*3/uL (ref 4.0–10.5)

## 2013-11-20 LAB — COMPREHENSIVE METABOLIC PANEL
ALT: 32 U/L (ref 0–35)
AST: 28 U/L (ref 0–37)
Albumin: 3.9 g/dL (ref 3.5–5.2)
Alkaline Phosphatase: 132 U/L — ABNORMAL HIGH (ref 39–117)
BUN: 17 mg/dL (ref 6–23)
CO2: 29 mEq/L (ref 19–32)
Calcium: 9.1 mg/dL (ref 8.4–10.5)
Chloride: 101 mEq/L (ref 96–112)
Creatinine, Ser: 1 mg/dL (ref 0.4–1.2)
GFR: 59.35 mL/min — ABNORMAL LOW (ref >60.00)
Glucose, Bld: 138 mg/dL — ABNORMAL HIGH (ref 70–99)
Potassium: 4.4 mEq/L (ref 3.5–5.1)
Sodium: 137 mEq/L (ref 135–145)
Total Bilirubin: 0.3 mg/dL (ref 0.2–1.2)
Total Protein: 7.2 g/dL (ref 6.0–8.3)

## 2013-11-20 NOTE — Telephone Encounter (Signed)
Pt notified and will come to office today for labs. Denies any bleeding, increased fatigue, or shortness of breath.

## 2013-11-20 NOTE — Telephone Encounter (Signed)
Spoke with pt regarding lab results showing marked anemia and iron deficiency. She has also contacted Dr. Jeb Levering her hematologist. Can you please fax the results to his office first thing in the morning? Thanks

## 2013-11-20 NOTE — Telephone Encounter (Signed)
Received labs from [facility] from 5/11. Hemoglobin was markedly low at 7.6. I would like to have pt come in to our office this afternoon and repeat stat CBC and CMP. I am concerned that labs were inaccurate. Can you ask her to stop by for this?

## 2013-11-21 ENCOUNTER — Encounter: Payer: Self-pay | Admitting: Internal Medicine

## 2013-11-21 ENCOUNTER — Ambulatory Visit: Payer: Self-pay | Admitting: Oncology

## 2013-11-21 DIAGNOSIS — Z79899 Other long term (current) drug therapy: Secondary | ICD-10-CM | POA: Diagnosis not present

## 2013-11-21 DIAGNOSIS — D509 Iron deficiency anemia, unspecified: Secondary | ICD-10-CM | POA: Diagnosis not present

## 2013-11-21 DIAGNOSIS — Z85820 Personal history of malignant melanoma of skin: Secondary | ICD-10-CM | POA: Diagnosis not present

## 2013-11-21 LAB — IRON AND TIBC
Iron Bind.Cap.(Total): 436 ug/dL (ref 250–450)
Iron Saturation: 5 %
Iron: 21 ug/dL — ABNORMAL LOW (ref 50–170)
Unbound Iron-Bind.Cap.: 415 ug/dL

## 2013-11-21 LAB — RETICULOCYTES
Absolute Retic Count: 0.1047 10*6/uL (ref 0.019–0.186)
Reticulocyte: 2.54 % (ref 0.4–3.1)

## 2013-11-21 LAB — CANCER CENTER HEMOGLOBIN: HGB: 7.5 g/dL — ABNORMAL LOW (ref 12.0–16.0)

## 2013-11-21 LAB — FERRITIN: Ferritin (ARMC): 4 ng/mL — ABNORMAL LOW (ref 8–388)

## 2013-11-21 NOTE — Telephone Encounter (Signed)
Faxed by Carolee Rota this am via Epic

## 2013-11-27 LAB — OCCULT BLOOD X 1 CARD TO LAB, STOOL
Occult Blood, Feces: NEGATIVE
Occult Blood, Feces: NEGATIVE

## 2013-12-04 DIAGNOSIS — M5136 Other intervertebral disc degeneration, lumbar region: Secondary | ICD-10-CM | POA: Insufficient documentation

## 2013-12-04 DIAGNOSIS — M48062 Spinal stenosis, lumbar region with neurogenic claudication: Secondary | ICD-10-CM | POA: Diagnosis not present

## 2013-12-04 DIAGNOSIS — IMO0002 Reserved for concepts with insufficient information to code with codable children: Secondary | ICD-10-CM | POA: Diagnosis not present

## 2013-12-04 DIAGNOSIS — M25559 Pain in unspecified hip: Secondary | ICD-10-CM | POA: Diagnosis not present

## 2013-12-04 DIAGNOSIS — M5137 Other intervertebral disc degeneration, lumbosacral region: Secondary | ICD-10-CM | POA: Diagnosis not present

## 2013-12-10 ENCOUNTER — Ambulatory Visit: Payer: Self-pay | Admitting: Oncology

## 2013-12-10 ENCOUNTER — Encounter: Payer: Self-pay | Admitting: Internal Medicine

## 2013-12-10 DIAGNOSIS — D509 Iron deficiency anemia, unspecified: Secondary | ICD-10-CM | POA: Diagnosis not present

## 2013-12-10 DIAGNOSIS — Z79899 Other long term (current) drug therapy: Secondary | ICD-10-CM | POA: Diagnosis not present

## 2013-12-10 DIAGNOSIS — Z85820 Personal history of malignant melanoma of skin: Secondary | ICD-10-CM | POA: Diagnosis not present

## 2013-12-10 DIAGNOSIS — E039 Hypothyroidism, unspecified: Secondary | ICD-10-CM | POA: Diagnosis not present

## 2013-12-12 ENCOUNTER — Ambulatory Visit: Payer: Medicare Other | Admitting: Internal Medicine

## 2013-12-12 DIAGNOSIS — D509 Iron deficiency anemia, unspecified: Secondary | ICD-10-CM | POA: Diagnosis not present

## 2013-12-12 DIAGNOSIS — Z85820 Personal history of malignant melanoma of skin: Secondary | ICD-10-CM | POA: Diagnosis not present

## 2013-12-12 LAB — CBC CANCER CENTER
Basophil #: 0.1 x10 3/mm (ref 0.0–0.1)
Basophil %: 0.7 %
Eosinophil #: 0.1 x10 3/mm (ref 0.0–0.7)
Eosinophil %: 1.6 %
HCT: 36.8 % (ref 35.0–47.0)
HGB: 11.4 g/dL — ABNORMAL LOW (ref 12.0–16.0)
Lymphocyte #: 1.2 x10 3/mm (ref 1.0–3.6)
Lymphocyte %: 15.4 %
MCH: 23 pg — ABNORMAL LOW (ref 26.0–34.0)
MCHC: 31.1 g/dL — ABNORMAL LOW (ref 32.0–36.0)
MCV: 74 fL — ABNORMAL LOW (ref 80–100)
Monocyte #: 0.4 x10 3/mm (ref 0.2–0.9)
Monocyte %: 4.7 %
Neutrophil #: 6 x10 3/mm (ref 1.4–6.5)
Neutrophil %: 77.6 %
Platelet: 160 x10 3/mm (ref 150–440)
RBC: 4.98 10*6/uL (ref 3.80–5.20)
RDW: 33.6 % — ABNORMAL HIGH (ref 11.5–14.5)
WBC: 7.7 x10 3/mm (ref 3.6–11.0)

## 2013-12-17 ENCOUNTER — Encounter: Payer: Self-pay | Admitting: Internal Medicine

## 2013-12-17 ENCOUNTER — Ambulatory Visit (INDEPENDENT_AMBULATORY_CARE_PROVIDER_SITE_OTHER): Payer: Medicare Other | Admitting: Internal Medicine

## 2013-12-17 VITALS — BP 110/60 | HR 76 | Temp 98.8°F | Ht 61.75 in | Wt 205.0 lb

## 2013-12-17 DIAGNOSIS — N390 Urinary tract infection, site not specified: Secondary | ICD-10-CM

## 2013-12-17 DIAGNOSIS — R300 Dysuria: Secondary | ICD-10-CM | POA: Diagnosis not present

## 2013-12-17 DIAGNOSIS — M6281 Muscle weakness (generalized): Secondary | ICD-10-CM | POA: Diagnosis not present

## 2013-12-17 MED ORDER — CIPROFLOXACIN HCL 500 MG PO TABS: 500.0000 mg | ORAL_TABLET | Freq: Two times a day (BID) | ORAL | Status: DC

## 2013-12-17 NOTE — Assessment & Plan Note (Signed)
Symptoms consistent with UTI. Will start Cipro. Will send urine for culture. Increase fluids po and prn Azo. Follow up prn if symptoms not improving.

## 2013-12-17 NOTE — Progress Notes (Signed)
Pre visit review using our clinic review tool, if applicable. No additional management support is needed unless otherwise documented below in the visit note. 

## 2013-12-17 NOTE — Progress Notes (Signed)
   Subjective:    Patient ID: Janet Rice, female    DOB: [birth year], 68 y.o.   MRN: [id number]  HPI 68YO female presents for acute visit. Developed burning with urination Friday. Also notes urinary urgency and frequency. Urine is cloudy. Non-bloody. No fever, chills. Taking cranberry tablets with no improvement.  Review of Systems  Constitutional: Negative for fever, chills and fatigue.  Gastrointestinal: Negative for nausea, vomiting, abdominal pain, diarrhea, constipation and rectal pain.  Genitourinary: Positive for dysuria, urgency and frequency. Negative for hematuria, flank pain, decreased urine volume, vaginal bleeding, vaginal discharge, difficulty urinating, vaginal pain and pelvic pain.       Objective:    BP 110/60  Pulse 76  Temp(Src) 98.8 F (37.1 C) (Oral)  Ht 5' 1.75" (1.568 m)  Wt 205 lb (92.987 kg)  BMI 37.82 kg/m2  SpO2 96% Physical Exam  Constitutional: She is oriented to person, place, and time. She appears well-developed and well-nourished. No distress.  HENT:  Head: Normocephalic and atraumatic.  Right Ear: External ear normal.  Left Ear: External ear normal.  Nose: Nose normal.  Mouth/Throat: Oropharynx is clear and moist.  Eyes: Conjunctivae are normal. Pupils are equal, round, and reactive to light. Right eye exhibits no discharge. Left eye exhibits no discharge. No scleral icterus.  Neck: Normal range of motion. Neck supple. No tracheal deviation present. No thyromegaly present.  Cardiovascular: Normal rate, regular rhythm, normal heart sounds and intact distal pulses.  Exam reveals no gallop and no friction rub.   No murmur heard. Pulmonary/Chest: Effort normal and breath sounds normal. No accessory muscle usage. Not tachypneic. No respiratory distress. She has no decreased breath sounds. She has no wheezes. She has no rhonchi. She has no rales. She exhibits no tenderness.  Abdominal: There is no tenderness (no cva tenderness).    Musculoskeletal: Normal range of motion. She exhibits no edema and no tenderness.  Lymphadenopathy:    She has no cervical adenopathy.  Neurological: She is alert and oriented to person, place, and time. No cranial nerve deficit. She exhibits normal muscle tone. Coordination normal.  Skin: Skin is warm and dry. No rash noted. She is not diaphoretic. No erythema. No pallor.  Psychiatric: She has a normal mood and affect. Her behavior is normal. Judgment and thought content normal.          Assessment & Plan:   Problem List Items Addressed This Visit     Unprioritized   UTI (urinary tract infection) - Primary     Symptoms consistent with UTI. Will start Cipro. Will send urine for culture. Increase fluids po and prn Azo. Follow up prn if symptoms not improving.     Other Visit Diagnoses   Dysuria        Relevant Medications       ciprofloxacin (CIPRO) tablet    Other Relevant Orders       POCT Urinalysis Dipstick        Return if symptoms worsen or fail to improve.

## 2013-12-17 NOTE — Patient Instructions (Signed)
Start Cipro 500mg  twice daily.  Increase fluid intake and start over the counter Azo to help with symptoms.  Call immediately if any worsening symptoms, fever, or other concerns.

## 2013-12-18 DIAGNOSIS — N390 Urinary tract infection, site not specified: Secondary | ICD-10-CM | POA: Diagnosis not present

## 2013-12-18 DIAGNOSIS — R300 Dysuria: Secondary | ICD-10-CM | POA: Diagnosis not present

## 2013-12-18 LAB — POCT URINALYSIS DIPSTICK
Bilirubin, UA: NEGATIVE
Blood, UA: NEGATIVE
Glucose, UA: NEGATIVE
Ketones, UA: NEGATIVE
Nitrite, UA: POSITIVE
Protein, UA: NEGATIVE
Spec Grav, UA: 1.01
Urobilinogen, UA: 0.2
pH, UA: 5.5

## 2013-12-18 NOTE — Addendum Note (Signed)
Addended by: Karlene Einstein D on: 12/18/2013 04:41 PM   Modules accepted: Orders

## 2013-12-20 LAB — URINE CULTURE: Colony Count: >=100000

## 2013-12-21 DIAGNOSIS — M169 Osteoarthritis of hip, unspecified: Secondary | ICD-10-CM | POA: Diagnosis not present

## 2013-12-21 DIAGNOSIS — M25559 Pain in unspecified hip: Secondary | ICD-10-CM | POA: Diagnosis not present

## 2013-12-21 DIAGNOSIS — M1610 Unilateral primary osteoarthritis, unspecified hip: Secondary | ICD-10-CM | POA: Diagnosis not present

## 2014-01-02 DIAGNOSIS — L821 Other seborrheic keratosis: Secondary | ICD-10-CM | POA: Diagnosis not present

## 2014-01-02 DIAGNOSIS — D509 Iron deficiency anemia, unspecified: Secondary | ICD-10-CM | POA: Diagnosis not present

## 2014-01-02 DIAGNOSIS — Z85820 Personal history of malignant melanoma of skin: Secondary | ICD-10-CM | POA: Diagnosis not present

## 2014-01-02 DIAGNOSIS — L570 Actinic keratosis: Secondary | ICD-10-CM | POA: Diagnosis not present

## 2014-01-02 DIAGNOSIS — D235 Other benign neoplasm of skin of trunk: Secondary | ICD-10-CM | POA: Diagnosis not present

## 2014-01-07 DIAGNOSIS — G5600 Carpal tunnel syndrome, unspecified upper limb: Secondary | ICD-10-CM | POA: Diagnosis not present

## 2014-01-07 DIAGNOSIS — R209 Unspecified disturbances of skin sensation: Secondary | ICD-10-CM | POA: Diagnosis not present

## 2014-01-09 ENCOUNTER — Ambulatory Visit: Payer: Self-pay | Admitting: Oncology

## 2014-01-10 ENCOUNTER — Other Ambulatory Visit: Payer: Self-pay | Admitting: Internal Medicine

## 2014-01-11 ENCOUNTER — Other Ambulatory Visit: Payer: Self-pay | Admitting: Internal Medicine

## 2014-01-14 NOTE — Telephone Encounter (Signed)
Last refill 5.5.15, last OV 6.8.15, future OV 7.8.15.  Please advise refill.

## 2014-01-16 ENCOUNTER — Encounter: Payer: Self-pay | Admitting: Internal Medicine

## 2014-01-16 ENCOUNTER — Ambulatory Visit (INDEPENDENT_AMBULATORY_CARE_PROVIDER_SITE_OTHER): Payer: Medicare Other | Admitting: Internal Medicine

## 2014-01-16 VITALS — BP 100/60 | HR 62 | Temp 97.7°F | Ht 61.75 in | Wt 204.0 lb

## 2014-01-16 DIAGNOSIS — M48061 Spinal stenosis, lumbar region without neurogenic claudication: Secondary | ICD-10-CM | POA: Diagnosis not present

## 2014-01-16 DIAGNOSIS — D500 Iron deficiency anemia secondary to blood loss (chronic): Secondary | ICD-10-CM

## 2014-01-16 DIAGNOSIS — Z23 Encounter for immunization: Secondary | ICD-10-CM | POA: Diagnosis not present

## 2014-01-16 DIAGNOSIS — M25559 Pain in unspecified hip: Secondary | ICD-10-CM

## 2014-01-16 NOTE — Progress Notes (Signed)
Pre visit review using our clinic review tool, if applicable. No additional management support is needed unless otherwise documented below in the visit note. 

## 2014-01-16 NOTE — Assessment & Plan Note (Signed)
Symptoms improved after bilateral steroid injection. Continue to follow with Dr. Sharlet Salina. Continue prn Tramadol.

## 2014-01-16 NOTE — Progress Notes (Signed)
Subjective:    Patient ID: Janet Rice, female    DOB: 1945/12/28, 68 y.o.   MRN: [id number]  HPI 68YO female presents for follow up.  Low back and hip pain - Seen by Dr. Sharlet Salina 5/26. Had bilateral hip steroid injection with significant improvement in right hip pain. No improvement in low back aching pain. Plans for follow up this week.  Anemia - Recently had CBC repeated, and Hgb reportedly 12. Energy level improved. Has follow up labs scheduled in 2 months.  Recently had EMG testing which confirmed carpal tunnel. Plans to follow up with Dr. Sharlet Salina and discuss possible surgery for this.  No new concerns today.  Review of Systems  Constitutional: Negative for fever, chills, appetite change, fatigue and unexpected weight change.  Eyes: Negative for visual disturbance.  Respiratory: Negative for shortness of breath.   Cardiovascular: Negative for chest pain and leg swelling.  Gastrointestinal: Negative for nausea, vomiting, abdominal pain, diarrhea, constipation and blood in stool.  Genitourinary: Negative for dysuria, urgency, frequency, hematuria, flank pain and pelvic pain.  Musculoskeletal: Positive for arthralgias, back pain and myalgias.  Skin: Negative for color change and rash.  Hematological: Negative for adenopathy. Does not bruise/bleed easily.  Psychiatric/Behavioral: Negative for dysphoric mood. The patient is not nervous/anxious.        Objective:    BP 100/60  Pulse 62  Temp(Src) 97.7 F (36.5 C) (Oral)  Ht 5' 1.75" (1.568 m)  Wt 204 lb (92.534 kg)  BMI 37.64 kg/m2  SpO2 94% Physical Exam  Constitutional: She is oriented to person, place, and time. She appears well-developed and well-nourished. No distress.  HENT:  Head: Normocephalic and atraumatic.  Right Ear: External ear normal.  Left Ear: External ear normal.  Nose: Nose normal.  Mouth/Throat: Oropharynx is clear and moist. No oropharyngeal exudate.  Eyes: Conjunctivae are normal. Pupils are  equal, round, and reactive to light. Right eye exhibits no discharge. Left eye exhibits no discharge. No scleral icterus.  Neck: Normal range of motion. Neck supple. No tracheal deviation present. No thyromegaly present.  Cardiovascular: Normal rate, regular rhythm, normal heart sounds and intact distal pulses.  Exam reveals no gallop and no friction rub.   No murmur heard. Pulmonary/Chest: Effort normal and breath sounds normal. No accessory muscle usage. Not tachypneic. No respiratory distress. She has no decreased breath sounds. She has no wheezes. She has no rhonchi. She has no rales. She exhibits no tenderness.  Musculoskeletal: She exhibits no edema and no tenderness.       Lumbar back: She exhibits pain. She exhibits normal range of motion, no tenderness and no bony tenderness.  Lymphadenopathy:    She has no cervical adenopathy.  Neurological: She is alert and oriented to person, place, and time. No cranial nerve deficit. She exhibits normal muscle tone. Coordination normal.  Skin: Skin is warm and dry. No rash noted. She is not diaphoretic. No erythema. No pallor.  Psychiatric: She has a normal mood and affect. Her behavior is normal. Judgment and thought content normal.          Assessment & Plan:   Problem List Items Addressed This Visit     Unprioritized   Anemia     Hgb reportedly improved with iron supplementation. Will request recent notes from Dr. Jeb Levering.    Arthralgia of hip     Symptoms improved after bilateral steroid injection. Continue to follow with Dr. Sharlet Salina. Continue prn Tramadol.    Spinal stenosis of lumbar region -  Primary     Reviewed notes from Dr. Sharlet Salina. Symptoms relatively well controlled at present. Continues on Tramadol. Possible ESI in the future if symptoms persistent.        Return in about 4 months (around 05/19/2014) for Wellness Visit.

## 2014-01-16 NOTE — Assessment & Plan Note (Signed)
Hgb reportedly improved with iron supplementation. Will request recent notes from Dr. Jeb Levering.

## 2014-01-16 NOTE — Addendum Note (Signed)
Addended by: Vernetta Honey on: 01/16/2014 09:00 AM   Modules accepted: Orders

## 2014-01-16 NOTE — Patient Instructions (Signed)
Prevar today.  Follow up in 05/2014 for Wellness Visit.

## 2014-01-16 NOTE — Assessment & Plan Note (Signed)
Reviewed notes from Dr. Sharlet Salina. Symptoms relatively well controlled at present. Continues on Tramadol. Possible ESI in the future if symptoms persistent.

## 2014-01-31 DIAGNOSIS — M25559 Pain in unspecified hip: Secondary | ICD-10-CM | POA: Diagnosis not present

## 2014-01-31 DIAGNOSIS — M1610 Unilateral primary osteoarthritis, unspecified hip: Secondary | ICD-10-CM | POA: Diagnosis not present

## 2014-01-31 DIAGNOSIS — M169 Osteoarthritis of hip, unspecified: Secondary | ICD-10-CM | POA: Diagnosis not present

## 2014-01-31 DIAGNOSIS — M5137 Other intervertebral disc degeneration, lumbosacral region: Secondary | ICD-10-CM | POA: Diagnosis not present

## 2014-01-31 DIAGNOSIS — IMO0002 Reserved for concepts with insufficient information to code with codable children: Secondary | ICD-10-CM | POA: Diagnosis not present

## 2014-02-01 DIAGNOSIS — G5600 Carpal tunnel syndrome, unspecified upper limb: Secondary | ICD-10-CM | POA: Diagnosis not present

## 2014-02-01 DIAGNOSIS — S93409A Sprain of unspecified ligament of unspecified ankle, initial encounter: Secondary | ICD-10-CM | POA: Diagnosis not present

## 2014-02-13 DIAGNOSIS — M169 Osteoarthritis of hip, unspecified: Secondary | ICD-10-CM | POA: Diagnosis not present

## 2014-02-13 DIAGNOSIS — M25559 Pain in unspecified hip: Secondary | ICD-10-CM | POA: Diagnosis not present

## 2014-02-13 DIAGNOSIS — M1610 Unilateral primary osteoarthritis, unspecified hip: Secondary | ICD-10-CM | POA: Diagnosis not present

## 2014-03-06 DIAGNOSIS — D509 Iron deficiency anemia, unspecified: Secondary | ICD-10-CM | POA: Diagnosis not present

## 2014-03-14 ENCOUNTER — Ambulatory Visit: Payer: Self-pay | Admitting: Oncology

## 2014-03-14 DIAGNOSIS — D509 Iron deficiency anemia, unspecified: Secondary | ICD-10-CM | POA: Diagnosis not present

## 2014-03-14 DIAGNOSIS — Z79899 Other long term (current) drug therapy: Secondary | ICD-10-CM | POA: Diagnosis not present

## 2014-03-14 DIAGNOSIS — E039 Hypothyroidism, unspecified: Secondary | ICD-10-CM | POA: Diagnosis not present

## 2014-03-14 DIAGNOSIS — Z85820 Personal history of malignant melanoma of skin: Secondary | ICD-10-CM | POA: Diagnosis not present

## 2014-03-14 DIAGNOSIS — R609 Edema, unspecified: Secondary | ICD-10-CM | POA: Diagnosis not present

## 2014-03-15 DIAGNOSIS — G5600 Carpal tunnel syndrome, unspecified upper limb: Secondary | ICD-10-CM | POA: Diagnosis not present

## 2014-03-20 ENCOUNTER — Telehealth: Payer: Self-pay | Admitting: *Deleted

## 2014-03-20 DIAGNOSIS — M1610 Unilateral primary osteoarthritis, unspecified hip: Secondary | ICD-10-CM | POA: Diagnosis not present

## 2014-03-20 DIAGNOSIS — M5137 Other intervertebral disc degeneration, lumbosacral region: Secondary | ICD-10-CM | POA: Diagnosis not present

## 2014-03-20 DIAGNOSIS — M169 Osteoarthritis of hip, unspecified: Secondary | ICD-10-CM | POA: Diagnosis not present

## 2014-03-20 DIAGNOSIS — IMO0002 Reserved for concepts with insufficient information to code with codable children: Secondary | ICD-10-CM | POA: Diagnosis not present

## 2014-03-20 NOTE — Telephone Encounter (Signed)
Received a surgical clearance form from [facility], we will need to pt come in for an appt in order to complete this.  Please call and schedule appt

## 2014-03-21 NOTE — Telephone Encounter (Signed)
Patient has been notified and scheduled.

## 2014-03-22 ENCOUNTER — Other Ambulatory Visit: Payer: Self-pay | Admitting: Internal Medicine

## 2014-03-22 NOTE — Telephone Encounter (Signed)
Okay to refill? 

## 2014-03-27 DIAGNOSIS — M169 Osteoarthritis of hip, unspecified: Secondary | ICD-10-CM | POA: Diagnosis not present

## 2014-03-27 DIAGNOSIS — M1610 Unilateral primary osteoarthritis, unspecified hip: Secondary | ICD-10-CM | POA: Diagnosis not present

## 2014-04-11 ENCOUNTER — Ambulatory Visit: Payer: Self-pay | Admitting: Oncology

## 2014-04-11 ENCOUNTER — Other Ambulatory Visit: Payer: Self-pay | Admitting: *Deleted

## 2014-04-11 MED ORDER — PRAVASTATIN SODIUM 40 MG PO TABS: ORAL_TABLET | ORAL | Status: DC

## 2014-04-18 ENCOUNTER — Ambulatory Visit: Payer: Medicare Other | Admitting: Internal Medicine

## 2014-04-18 ENCOUNTER — Other Ambulatory Visit: Payer: Self-pay | Admitting: *Deleted

## 2014-04-18 MED ORDER — PRAVASTATIN SODIUM 40 MG PO TABS: ORAL_TABLET | ORAL | Status: DC

## 2014-04-25 ENCOUNTER — Ambulatory Visit: Payer: Medicare Other | Admitting: Internal Medicine

## 2014-04-25 ENCOUNTER — Telehealth: Payer: Self-pay

## 2014-04-25 NOTE — Telephone Encounter (Signed)
Notified Tabitha that pt is on the schedule for her surgical clearance appt on 04/27/14

## 2014-04-25 NOTE — Telephone Encounter (Signed)
Tabitha from [place] called and stated she has been faxing a surgical clearance form to the office since Sept 4th. She is hoping to have this form signed asap and faxed back. The patient is scheduled to have surgery soon.   Lawerance Bach , at [phone]

## 2014-04-26 DIAGNOSIS — M5416 Radiculopathy, lumbar region: Secondary | ICD-10-CM | POA: Diagnosis not present

## 2014-04-26 DIAGNOSIS — G5602 Carpal tunnel syndrome, left upper limb: Secondary | ICD-10-CM | POA: Diagnosis not present

## 2014-04-26 DIAGNOSIS — M160 Bilateral primary osteoarthritis of hip: Secondary | ICD-10-CM | POA: Diagnosis not present

## 2014-04-26 DIAGNOSIS — M5136 Other intervertebral disc degeneration, lumbar region: Secondary | ICD-10-CM | POA: Diagnosis not present

## 2014-04-27 ENCOUNTER — Encounter: Payer: Self-pay | Admitting: Internal Medicine

## 2014-04-27 ENCOUNTER — Ambulatory Visit (INDEPENDENT_AMBULATORY_CARE_PROVIDER_SITE_OTHER): Payer: Medicare Other | Admitting: Internal Medicine

## 2014-04-27 VITALS — BP 128/70 | HR 60 | Temp 98.0°F

## 2014-04-27 DIAGNOSIS — Z01818 Encounter for other preprocedural examination: Secondary | ICD-10-CM

## 2014-04-27 MED ORDER — CITALOPRAM HYDROBROMIDE 20 MG PO TABS: 20.0000 mg | ORAL_TABLET | Freq: Every day | ORAL | Status: DC

## 2014-04-27 NOTE — Assessment & Plan Note (Signed)
Patient would be considered low risk for perioperative cardiac events with upcoming surgery. Recommended proceeding with surgery. Follow up after surgery as needed.

## 2014-04-27 NOTE — Patient Instructions (Signed)
Continue current medications.  Flu vaccine through your work as scheduled.

## 2014-04-27 NOTE — Progress Notes (Signed)
   Subjective:    Patient ID: Janet Rice, female    DOB: 04-16-1946, 68 y.o.   MRN: [id number]  HPI 68YO female presents for surgical clearance prior to left carpal tunnel repair.  Surgery planned for 04/30/2014. Planning for general anesthesia.  No recent illnesses. No previous reaction to anesthesia. No previous issues with bleeding.  Review of Systems  Constitutional: Negative for fever, chills, appetite change, fatigue and unexpected weight change.  HENT: Negative for congestion.   Eyes: Negative for visual disturbance.  Respiratory: Negative for cough and shortness of breath.   Cardiovascular: Negative for chest pain and leg swelling.  Gastrointestinal: Negative for nausea, vomiting, abdominal pain, diarrhea and constipation.  Genitourinary: Negative for dysuria, urgency, frequency, flank pain and pelvic pain.  Skin: Negative for color change and rash.  Hematological: Negative for adenopathy. Does not bruise/bleed easily.  Psychiatric/Behavioral: Negative for dysphoric mood. The patient is not nervous/anxious.        Objective:    BP 128/70  Pulse 60  Temp(Src) 98 F (36.7 C)  SpO2 95% Physical Exam  Constitutional: She is oriented to person, place, and time. She appears well-developed and well-nourished. No distress.  HENT:  Head: Normocephalic and atraumatic.  Right Ear: External ear normal.  Left Ear: External ear normal.  Nose: Nose normal.  Mouth/Throat: Oropharynx is clear and moist. No oropharyngeal exudate.  Eyes: Conjunctivae are normal. Pupils are equal, round, and reactive to light. Right eye exhibits no discharge. Left eye exhibits no discharge. No scleral icterus.  Neck: Normal range of motion. Neck supple. No tracheal deviation present. No thyromegaly present.  Cardiovascular: Normal rate, regular rhythm, normal heart sounds and intact distal pulses.  Exam reveals no gallop and no friction rub.   No murmur heard. Pulmonary/Chest: Effort normal  and breath sounds normal. No accessory muscle usage. Not tachypneic. No respiratory distress. She has no decreased breath sounds. She has no wheezes. She has no rhonchi. She has no rales. She exhibits no tenderness.  Musculoskeletal: Normal range of motion. She exhibits no edema and no tenderness.  Lymphadenopathy:    She has no cervical adenopathy.  Neurological: She is alert and oriented to person, place, and time. No cranial nerve deficit. She exhibits normal muscle tone. Coordination normal.  Skin: Skin is warm and dry. No rash noted. She is not diaphoretic. No erythema. No pallor.  Psychiatric: She has a normal mood and affect. Her behavior is normal. Judgment and thought content normal.          Assessment & Plan:   Problem List Items Addressed This Visit     Unprioritized   Preop examination - Primary     Patient would be considered low risk for perioperative cardiac events with upcoming surgery. Recommended proceeding with surgery. Follow up after surgery as needed.        Return if symptoms worsen or fail to improve.

## 2014-04-30 ENCOUNTER — Ambulatory Visit: Payer: Self-pay | Admitting: Orthopedic Surgery

## 2014-04-30 DIAGNOSIS — Z6836 Body mass index (BMI) 36.0-36.9, adult: Secondary | ICD-10-CM | POA: Diagnosis not present

## 2014-04-30 DIAGNOSIS — Z87891 Personal history of nicotine dependence: Secondary | ICD-10-CM | POA: Diagnosis not present

## 2014-04-30 DIAGNOSIS — E669 Obesity, unspecified: Secondary | ICD-10-CM | POA: Diagnosis not present

## 2014-04-30 DIAGNOSIS — G5602 Carpal tunnel syndrome, left upper limb: Secondary | ICD-10-CM | POA: Diagnosis not present

## 2014-04-30 DIAGNOSIS — M1389 Other specified arthritis, multiple sites: Secondary | ICD-10-CM | POA: Diagnosis not present

## 2014-04-30 DIAGNOSIS — K219 Gastro-esophageal reflux disease without esophagitis: Secondary | ICD-10-CM | POA: Diagnosis not present

## 2014-05-10 ENCOUNTER — Ambulatory Visit: Payer: Self-pay | Admitting: Physical Medicine and Rehabilitation

## 2014-05-10 DIAGNOSIS — M5136 Other intervertebral disc degeneration, lumbar region: Secondary | ICD-10-CM | POA: Diagnosis not present

## 2014-05-10 DIAGNOSIS — M519 Unspecified thoracic, thoracolumbar and lumbosacral intervertebral disc disorder: Secondary | ICD-10-CM | POA: Diagnosis not present

## 2014-05-10 DIAGNOSIS — M4806 Spinal stenosis, lumbar region: Secondary | ICD-10-CM | POA: Diagnosis not present

## 2014-05-10 DIAGNOSIS — M545 Low back pain: Secondary | ICD-10-CM | POA: Diagnosis not present

## 2014-05-14 DIAGNOSIS — M4806 Spinal stenosis, lumbar region: Secondary | ICD-10-CM | POA: Diagnosis not present

## 2014-05-14 DIAGNOSIS — M5416 Radiculopathy, lumbar region: Secondary | ICD-10-CM | POA: Diagnosis not present

## 2014-05-18 LAB — HM MAMMOGRAPHY: HM Mammogram: NORMAL

## 2014-05-21 ENCOUNTER — Encounter: Payer: Medicare Other | Admitting: Internal Medicine

## 2014-06-10 DIAGNOSIS — Z85820 Personal history of malignant melanoma of skin: Secondary | ICD-10-CM | POA: Diagnosis not present

## 2014-06-10 DIAGNOSIS — D509 Iron deficiency anemia, unspecified: Secondary | ICD-10-CM | POA: Diagnosis not present

## 2014-06-13 ENCOUNTER — Ambulatory Visit: Payer: Self-pay | Admitting: Oncology

## 2014-06-13 DIAGNOSIS — C439 Malignant melanoma of skin, unspecified: Secondary | ICD-10-CM | POA: Diagnosis not present

## 2014-06-13 DIAGNOSIS — E039 Hypothyroidism, unspecified: Secondary | ICD-10-CM | POA: Diagnosis not present

## 2014-06-13 DIAGNOSIS — D509 Iron deficiency anemia, unspecified: Secondary | ICD-10-CM | POA: Diagnosis not present

## 2014-06-13 DIAGNOSIS — Z79899 Other long term (current) drug therapy: Secondary | ICD-10-CM | POA: Diagnosis not present

## 2014-06-13 DIAGNOSIS — R609 Edema, unspecified: Secondary | ICD-10-CM | POA: Diagnosis not present

## 2014-06-13 DIAGNOSIS — Z1231 Encounter for screening mammogram for malignant neoplasm of breast: Secondary | ICD-10-CM | POA: Diagnosis not present

## 2014-06-14 ENCOUNTER — Other Ambulatory Visit: Payer: Self-pay | Admitting: Internal Medicine

## 2014-06-20 DIAGNOSIS — R609 Edema, unspecified: Secondary | ICD-10-CM | POA: Diagnosis not present

## 2014-06-20 DIAGNOSIS — D509 Iron deficiency anemia, unspecified: Secondary | ICD-10-CM | POA: Diagnosis not present

## 2014-06-20 DIAGNOSIS — E039 Hypothyroidism, unspecified: Secondary | ICD-10-CM | POA: Diagnosis not present

## 2014-06-20 DIAGNOSIS — Z1231 Encounter for screening mammogram for malignant neoplasm of breast: Secondary | ICD-10-CM | POA: Diagnosis not present

## 2014-06-20 DIAGNOSIS — C439 Malignant melanoma of skin, unspecified: Secondary | ICD-10-CM | POA: Diagnosis not present

## 2014-06-20 DIAGNOSIS — Z79899 Other long term (current) drug therapy: Secondary | ICD-10-CM | POA: Diagnosis not present

## 2014-06-24 DIAGNOSIS — M5416 Radiculopathy, lumbar region: Secondary | ICD-10-CM | POA: Diagnosis not present

## 2014-06-24 DIAGNOSIS — M5136 Other intervertebral disc degeneration, lumbar region: Secondary | ICD-10-CM | POA: Diagnosis not present

## 2014-06-28 ENCOUNTER — Other Ambulatory Visit: Payer: Self-pay | Admitting: Internal Medicine

## 2014-07-03 DIAGNOSIS — M5416 Radiculopathy, lumbar region: Secondary | ICD-10-CM | POA: Diagnosis not present

## 2014-07-03 DIAGNOSIS — M5136 Other intervertebral disc degeneration, lumbar region: Secondary | ICD-10-CM | POA: Diagnosis not present

## 2014-07-10 ENCOUNTER — Other Ambulatory Visit: Payer: Self-pay | Admitting: Internal Medicine

## 2014-07-11 ENCOUNTER — Encounter: Payer: Self-pay | Admitting: *Deleted

## 2014-07-12 ENCOUNTER — Ambulatory Visit: Payer: Self-pay | Admitting: Oncology

## 2014-07-18 ENCOUNTER — Encounter: Payer: Self-pay | Admitting: Internal Medicine

## 2014-07-18 ENCOUNTER — Ambulatory Visit (INDEPENDENT_AMBULATORY_CARE_PROVIDER_SITE_OTHER): Payer: Medicare Other | Admitting: Internal Medicine

## 2014-07-18 VITALS — BP 122/62 | HR 79 | Temp 98.4°F | Resp 14 | Ht 61.75 in | Wt 212.8 lb

## 2014-07-18 DIAGNOSIS — Z780 Asymptomatic menopausal state: Secondary | ICD-10-CM

## 2014-07-18 DIAGNOSIS — Z1211 Encounter for screening for malignant neoplasm of colon: Secondary | ICD-10-CM

## 2014-07-18 DIAGNOSIS — E669 Obesity, unspecified: Secondary | ICD-10-CM | POA: Diagnosis not present

## 2014-07-18 DIAGNOSIS — Z0000 Encounter for general adult medical examination without abnormal findings: Secondary | ICD-10-CM

## 2014-07-18 MED ORDER — TRAMADOL HCL 50 MG PO TABS: 50.0000 mg | ORAL_TABLET | Freq: Three times a day (TID) | ORAL | Status: DC | PRN

## 2014-07-18 NOTE — Progress Notes (Signed)
Subjective:    Patient ID: Janet Rice, female    DOB: 08-Jun-1946, 69 y.o.   MRN: [id number]  HPI The patient is here for annual Medicare wellness examination and management of other chronic and acute problems.   The risk factors are reflected in the social history.  The roster of all physicians providing medical care to patient - is listed in the Snapshot section of the chart.  Activities of daily living:  The patient is 100% independent in all ADLs: dressing, toileting, feeding as well as independent mobility. Lives with husband in a one story home. Has carpeted floors and tile floor. No pets.  Home safety : The patient has smoke detectors in the home. They wear seatbelts.  There are no firearms at home. There is no violence in the home.   There is no risks for hepatitis, STDs or HIV. There is no history of blood transfusion. They have no travel history to infectious disease endemic areas of the world.  The patient has seen their dentist in the last six month. (Dentist - Dr. Delice Bison) They have seen their eye doctor in the last year. (Dr.Gottwald - pt daughter) No hearing issues. They have deferred audiologic testing in the last year.    Dermatologist - [facility] - Dr. Kellie Moor. They do not  have excessive sun exposure. Discussed the need for sun protection: hats, long sleeves and use of sunscreen if there is significant sun exposure.   Ortho - Dr. Mack Guise [facility] - Dr. Sharlet Salina  Diet: the importance of a healthy diet is discussed. They do have a relatively healthy diet.  The benefits of regular aerobic exercise were discussed. Not able to walk regularly at present. Does some stretching.  Depression screen: there are no signs or vegative symptoms of depression- irritability, change in appetite, anhedonia, sadness/tearfullness.  Cognitive assessment: the patient manages all their financial and personal affairs and is actively engaged. They could relate  day,date,year and events.  HCPOA - husband, Miabella Shannahan, then daughter Shir Bergman  The following portions of the patient's history were reviewed and updated as appropriate: allergies, current medications, past family history, past medical history,  past surgical history, past social history  and problem list.  Visual acuity was not assessed per patient preference since she has regular follow up with her ophthalmologist. Hearing and body mass index were assessed and reviewed.   During the course of the visit the patient was educated and counseled about appropriate screening and preventive services including : fall prevention , diabetes screening, nutrition counseling, colorectal cancer screening, and recommended immunizations.      Past medical, surgical, family and social history per today's encounter.  Review of Systems  Constitutional: Negative for fever, chills, appetite change, fatigue and unexpected weight change.  Eyes: Negative for visual disturbance.  Respiratory: Negative for shortness of breath.   Cardiovascular: Negative for chest pain and leg swelling.  Gastrointestinal: Negative for nausea, vomiting, abdominal pain, diarrhea and constipation.  Musculoskeletal: Positive for myalgias, back pain and arthralgias.  Skin: Negative for color change and rash.  Hematological: Negative for adenopathy. Does not bruise/bleed easily.  Psychiatric/Behavioral: Negative for sleep disturbance and dysphoric mood. The patient is not nervous/anxious.        Objective:    BP 122/62 mmHg  Pulse 79  Temp(Src) 98.4 F (36.9 C) (Oral)  Resp 14  Ht 5' 1.75" (1.568 m)  Wt 212 lb 12 oz (96.503 kg)  BMI 39.25 kg/m2  SpO2 96% Physical Exam  Constitutional: She is oriented to person, place, and time. She appears well-developed and well-nourished. No distress.  HENT:  Head: Normocephalic and atraumatic.  Right Ear: External ear normal.  Left Ear: External ear normal.  Nose: Nose normal.    Mouth/Throat: Oropharynx is clear and moist. No oropharyngeal exudate.  Eyes: Conjunctivae are normal. Pupils are equal, round, and reactive to light. Right eye exhibits no discharge. Left eye exhibits no discharge. No scleral icterus.  Neck: Normal range of motion. Neck supple. No tracheal deviation present. No thyromegaly present.  Cardiovascular: Normal rate, regular rhythm, normal heart sounds and intact distal pulses.  Exam reveals no gallop and no friction rub.   No murmur heard. Pulmonary/Chest: Effort normal and breath sounds normal. No accessory muscle usage. No tachypnea. No respiratory distress. She has no decreased breath sounds. She has no wheezes. She has no rales. She exhibits no tenderness. Right breast exhibits no inverted nipple, no mass, no nipple discharge, no skin change and no tenderness. Left breast exhibits no inverted nipple, no mass, no nipple discharge, no skin change and no tenderness. Breasts are symmetrical.  Abdominal: Soft. Bowel sounds are normal. She exhibits no distension and no mass. There is no tenderness. There is no rebound and no guarding.  Musculoskeletal: Normal range of motion. She exhibits no edema or tenderness.  Lymphadenopathy:    She has no cervical adenopathy.  Neurological: She is alert and oriented to person, place, and time. No cranial nerve deficit. She exhibits normal muscle tone. Coordination normal.  Skin: Skin is warm and dry. No rash noted. She is not diaphoretic. No erythema. No pallor.  Psychiatric: She has a normal mood and affect. Her behavior is normal. Judgment and thought content normal.          Assessment & Plan:   Problem List Items Addressed This Visit      Unprioritized   Medicare annual wellness visit, subsequent - Primary    General medical exam normal today including breast exam. PAP and pelvic deferred as s/p hysterectomy. Mammogram UTD and reviewed. Colonoscopy ordered, last 2011. Immunizations UTD. Labs ordered  including CBC, CMP, lipids, A1c, TSH.    Obesity (BMI 30-39.9)    Wt Readings from Last 3 Encounters:  07/18/14 212 lb 12 oz (96.503 kg)  01/16/14 204 lb (92.534 kg)  12/17/13 205 lb (92.987 kg)   Body mass index is 39.25 kg/(m^2). The patient is asked to make an attempt to improve diet and exercise patterns to aid in medical management of this problem.      Other Visit Diagnoses    Special screening for malignant neoplasms, colon        Relevant Orders       Ambulatory referral to Gastroenterology    Postmenopausal estrogen deficiency        Relevant Orders       DG Bone Density        Return in about 6 months (around 01/16/2015) for Recheck.

## 2014-07-18 NOTE — Progress Notes (Signed)
Pre visit review using our clinic review tool, if applicable. No additional management support is needed unless otherwise documented below in the visit note. 

## 2014-07-18 NOTE — Assessment & Plan Note (Signed)
General medical exam normal today including breast exam. PAP and pelvic deferred as s/p hysterectomy. Mammogram UTD and reviewed. Colonoscopy ordered, last 2011. Immunizations UTD. Labs ordered including CBC, CMP, lipids, A1c, TSH.

## 2014-07-18 NOTE — Patient Instructions (Signed)

## 2014-07-18 NOTE — Assessment & Plan Note (Signed)
Wt Readings from Last 3 Encounters:  07/18/14 212 lb 12 oz (96.503 kg)  01/16/14 204 lb (92.534 kg)  12/17/13 205 lb (92.987 kg)   Body mass index is 39.25 kg/(m^2). The patient is asked to make an attempt to improve diet and exercise patterns to aid in medical management of this problem.

## 2014-07-19 ENCOUNTER — Encounter: Payer: Self-pay | Admitting: Internal Medicine

## 2014-07-24 DIAGNOSIS — Z0000 Encounter for general adult medical examination without abnormal findings: Secondary | ICD-10-CM | POA: Diagnosis not present

## 2014-07-26 ENCOUNTER — Other Ambulatory Visit: Payer: Self-pay | Admitting: *Deleted

## 2014-07-26 ENCOUNTER — Telehealth: Payer: Self-pay | Admitting: Internal Medicine

## 2014-07-26 MED ORDER — LEVOTHYROXINE SODIUM 125 MCG PO TABS: 125.0000 ug | ORAL_TABLET | Freq: Every day | ORAL | Status: DC

## 2014-07-26 NOTE — Telephone Encounter (Signed)
Labs show elevated TSH of 12, indicating that dose of Levothyroxine is too low. I would recommend we increase dose of Levothyroxine to 187mcg daily and repeat level of TSH in 6 weeks.

## 2014-07-26 NOTE — Telephone Encounter (Signed)
Notified pt, Sent rx to pharmacy

## 2014-08-08 DIAGNOSIS — M4806 Spinal stenosis, lumbar region: Secondary | ICD-10-CM | POA: Diagnosis not present

## 2014-08-08 DIAGNOSIS — M5416 Radiculopathy, lumbar region: Secondary | ICD-10-CM | POA: Diagnosis not present

## 2014-08-08 DIAGNOSIS — M5136 Other intervertebral disc degeneration, lumbar region: Secondary | ICD-10-CM | POA: Diagnosis not present

## 2014-08-14 ENCOUNTER — Encounter: Payer: Self-pay | Admitting: Internal Medicine

## 2014-08-20 ENCOUNTER — Other Ambulatory Visit: Payer: Self-pay | Admitting: *Deleted

## 2014-08-20 MED ORDER — LEVOTHYROXINE SODIUM 125 MCG PO TABS: 125.0000 ug | ORAL_TABLET | Freq: Every day | ORAL | Status: DC

## 2014-08-30 ENCOUNTER — Other Ambulatory Visit: Payer: Self-pay | Admitting: Internal Medicine

## 2014-09-02 ENCOUNTER — Other Ambulatory Visit: Payer: Self-pay | Admitting: Internal Medicine

## 2014-09-10 LAB — HM COLONOSCOPY: HM Colonoscopy: NORMAL

## 2014-09-18 ENCOUNTER — Ambulatory Visit (AMBULATORY_SURGERY_CENTER): Payer: Self-pay

## 2014-09-18 VITALS — Ht 62.25 in | Wt 210.8 lb

## 2014-09-18 DIAGNOSIS — Z860100 Personal history of colon polyps, unspecified: Secondary | ICD-10-CM

## 2014-09-18 MED ORDER — MOVIPREP 100 G PO SOLR: 1.0000 | Freq: Once | ORAL | Status: DC

## 2014-09-18 NOTE — Progress Notes (Signed)
No allergies to eggs or soy No past problems with anesthesia No diet/weight loss meds No home oxygen  Has email  Emmi instructions given for colonoscopy 

## 2014-09-27 DIAGNOSIS — L570 Actinic keratosis: Secondary | ICD-10-CM | POA: Diagnosis not present

## 2014-09-27 DIAGNOSIS — Z85828 Personal history of other malignant neoplasm of skin: Secondary | ICD-10-CM | POA: Diagnosis not present

## 2014-09-27 DIAGNOSIS — D2261 Melanocytic nevi of right upper limb, including shoulder: Secondary | ICD-10-CM | POA: Diagnosis not present

## 2014-09-27 DIAGNOSIS — Z85820 Personal history of malignant melanoma of skin: Secondary | ICD-10-CM | POA: Diagnosis not present

## 2014-10-02 ENCOUNTER — Encounter: Payer: Self-pay | Admitting: Internal Medicine

## 2014-10-02 ENCOUNTER — Ambulatory Visit (AMBULATORY_SURGERY_CENTER): Payer: Medicare Other | Admitting: Internal Medicine

## 2014-10-02 VITALS — BP 121/54 | HR 62 | Temp 96.8°F | Resp 32 | Ht 62.25 in | Wt 210.0 lb

## 2014-10-02 DIAGNOSIS — D122 Benign neoplasm of ascending colon: Secondary | ICD-10-CM | POA: Diagnosis not present

## 2014-10-02 DIAGNOSIS — D120 Benign neoplasm of cecum: Secondary | ICD-10-CM

## 2014-10-02 DIAGNOSIS — Z1211 Encounter for screening for malignant neoplasm of colon: Secondary | ICD-10-CM | POA: Diagnosis not present

## 2014-10-02 DIAGNOSIS — Z86010 Personal history of colonic polyps: Secondary | ICD-10-CM

## 2014-10-02 DIAGNOSIS — D125 Benign neoplasm of sigmoid colon: Secondary | ICD-10-CM

## 2014-10-02 MED ORDER — SODIUM CHLORIDE 0.9 % IV SOLN
500.0000 mL | INTRAVENOUS | Status: DC
Start: 2014-10-02 — End: 2014-10-02

## 2014-10-02 NOTE — Progress Notes (Signed)
A/ox3 pleased with MAC, report to Wendy RN 

## 2014-10-02 NOTE — Op Note (Signed)
[name], [address]   COLONOSCOPY PROCEDURE REPORT  PATIENT: Janet, Rice  MR#: [id number] BIRTHDATE: 07/08/1946 , 68  yrs. old GENDER: female ENDOSCOPIST: Jerene Bears, MD PROCEDURE DATE:  10/02/2014 PROCEDURE:   Colonoscopy with snare polypectomy and Colonoscopy, surveillance First Screening Colonoscopy - Avg.  risk and is 50 yrs.  old or older - No.  Prior Negative Screening - Now for repeat screening. N/A  History of Adenoma - Now for follow-up colonoscopy & has been > or = to 3 yrs.  Yes hx of adenoma.  Has been 3 or more years since last colonoscopy. ASA CLASS:   Class III INDICATIONS:Surveillance due to prior colonic neoplasia and PH Colon Adenoma. MEDICATIONS: Monitored anesthesia care and Propofol 250 mg IV  DESCRIPTION OF PROCEDURE:   After the risks benefits and alternatives of the procedure were thoroughly explained, informed consent was obtained.  The digital rectal exam revealed no rectal mass.   The LB [id number]  endoscope was introduced through the anus and advanced to the terminal ileum which was intubated for a short distance. No adverse events experienced.   The quality of the prep was (MoviPrep was used) good.  The instrument was then slowly withdrawn as the colon was fully examined.  COLON FINDINGS: The examined terminal ileum appeared to be normal. Five sessile polyps, 2 mucus caps, ranging from 3 to 54mm in size were found at the cecum (1), in the ascending colon (3), and sigmoid colon (1).  Polypectomies were performed with cold forceps (1 in cecum) and with a cold snare (4).  The resection was complete, the polyp tissue was completely retrieved and sent to histology.   There was mild diverticulosis noted in the transverse colon and sigmoid colon.   Previously placed tattoo seen in the sigmoid colon with no residual polyp identified.  Retroflexed views revealed internal hemorrhoids. The time to  cecum = 2.5 Withdrawal time = 12.2   The scope was withdrawn and the procedure completed. COMPLICATIONS: There were no immediate complications.  ENDOSCOPIC IMPRESSION: 1.   The examined terminal ileum appeared to be normal 2.   Five sessile polyps ranging from 3 to 44mm in size were found at the cecum, in the ascending colon, and sigmoid colon; polypectomies were performed with cold forceps and with a cold snare 3.   There was mild diverticulosis noted in the transverse colon and sigmoid colon 4.   Previously placed tattoo seen in the sigmoid colon with no residual polyp identified  RECOMMENDATIONS: 1.  Await pathology results 2.  Timing of repeat colonoscopy will be determined by pathology findings. 3.  You will receive a letter within 1-2 weeks with the results of your biopsy as well as final recommendations.  Please call my office if you have not received a letter after 3 weeks.  eSigned:  Jerene Bears, MD 10/02/2014 10:24 AM   cc: The Patient and Ronette Deter MD   PATIENT NAME:  Janet, Rice MR#: [id number]

## 2014-10-02 NOTE — Patient Instructions (Signed)
YOU HAD AN ENDOSCOPIC PROCEDURE TODAY AT [facility]:   Refer to the procedure report that was given to you for any specific questions about what was found during the examination.  If the procedure report does not answer your questions, please call your gastroenterologist to clarify.  If you requested that your care partner not be given the details of your procedure findings, then the procedure report has been included in a sealed envelope for you to review at your convenience later.  YOU SHOULD EXPECT: Some feelings of bloating in the abdomen. Passage of more gas than usual.  Walking can help get rid of the air that was put into your GI tract during the procedure and reduce the bloating. If you had a lower endoscopy (such as a colonoscopy or flexible sigmoidoscopy) you may notice spotting of blood in your stool or on the toilet paper. If you underwent a bowel prep for your procedure, you may not have a normal bowel movement for a few days.  Please Note:  You might notice some irritation and congestion in your nose or some drainage.  This is from the oxygen used during your procedure.  There is no need for concern and it should clear up in a day or so.  SYMPTOMS TO REPORT IMMEDIATELY:   Following lower endoscopy (colonoscopy or flexible sigmoidoscopy):  Excessive amounts of blood in the stool  Significant tenderness or worsening of abdominal pains  Swelling of the abdomen that is new, acute  Fever of 100F or higher   For urgent or emergent issues, a gastroenterologist can be reached at any hour by calling [phone].   DIET: Your first meal following the procedure should be a small meal and then it is ok to progress to your normal diet. Heavy or fried foods are harder to digest and may make you feel nauseous or bloated.  Likewise, meals heavy in dairy and vegetables can increase bloating.  Drink plenty of fluids but you should avoid alcoholic beverages for 24  hours.  ACTIVITY:  You should plan to take it easy for the rest of today and you should NOT DRIVE or use heavy machinery until tomorrow (because of the sedation medicines used during the test).    FOLLOW UP: Our staff will call the number listed on your records the next business day following your procedure to check on you and address any questions or concerns that you may have regarding the information given to you following your procedure. If we do not reach you, we will leave a message.  However, if you are feeling well and you are not experiencing any problems, there is no need to return our call.  We will assume that you have returned to your regular daily activities without incident.  If any biopsies were taken you will be contacted by phone or by letter within the next 1-3 weeks.  Please call us at [phone] if you have not heard about the biopsies in 3 weeks.    SIGNATURES/CONFIDENTIALITY: You and/or your care partner have signed paperwork which will be entered into your electronic medical record.  These signatures attest to the fact that that the information above on your After Visit Summary has been reviewed and is understood.  Full responsibility of the confidentiality of this discharge information lies with you and/or your care-partner.  Recommendations Discharge instructions given to patient and/or care partner. Next colonoscopy determined by pathology results. Polyp and diverticulosis handouts provided.

## 2014-10-02 NOTE — Progress Notes (Signed)
Called to room to assist during endoscopic procedure.  Patient ID and intended procedure confirmed with present staff. Received instructions for my participation in the procedure from the performing physician.  

## 2014-10-03 ENCOUNTER — Telehealth: Payer: Self-pay | Admitting: *Deleted

## 2014-10-03 NOTE — Telephone Encounter (Signed)
  Follow up Call-  Call back number 10/02/2014  Post procedure Call Back phone  # [phone]  Permission to leave phone message Yes     Patient questions:  Do you have a fever, pain , or abdominal swelling? No. Pain Score  0 *  Have you tolerated food without any problems? Yes.    Have you been able to return to your normal activities? Yes.    Do you have any questions about your discharge instructions: Diet   No. Medications  No. Follow up visit  No.  Do you have questions or concerns about your Care? No.  Actions: * If pain score is 4 or above: No action needed, pain <4.

## 2014-10-14 ENCOUNTER — Encounter: Payer: Self-pay | Admitting: Internal Medicine

## 2014-10-21 DIAGNOSIS — H35371 Puckering of macula, right eye: Secondary | ICD-10-CM | POA: Diagnosis not present

## 2014-10-21 DIAGNOSIS — H2513 Age-related nuclear cataract, bilateral: Secondary | ICD-10-CM | POA: Diagnosis not present

## 2014-10-30 DIAGNOSIS — M5136 Other intervertebral disc degeneration, lumbar region: Secondary | ICD-10-CM | POA: Diagnosis not present

## 2014-10-30 DIAGNOSIS — M4806 Spinal stenosis, lumbar region: Secondary | ICD-10-CM | POA: Diagnosis not present

## 2014-10-30 DIAGNOSIS — M5416 Radiculopathy, lumbar region: Secondary | ICD-10-CM | POA: Diagnosis not present

## 2014-11-01 NOTE — H&P (Signed)
Subjective/Chief Complaint Right bimalleolar ankle fracture dislocation   History of Present Illness Patient is a 69 y/o female who fell off a ladder putting up Christmas decorations at home.  She is seen in the ER with her husband.  Patient complains of right ankle pain, but denies numbness.  Reportedly hit her head.  Head CT negative.  Patient currently alert and cooperative with exam.   Past Med/Surgical Hx:  Hypothyroidism:   metastatic melanoma:   ALLERGIES:  Sulfa drugs: Unknown  HOME MEDICATIONS: Medication Instructions Status  citalopram 10 mg oral tablet 1 tab(s) orally once a day Active  Synthroid 100 mcg (0.1 mg) oral tablet 1 tab(s) orally once a day Active  calcium-vitamin D 1 tab(s) orally once a day Active  ibuprofen 200 mg oral tablet 1 tab(s) orally every 6 hours, As Needed - for Pain Active  multivitamin 1 tab(s) orally once a day Active  omeprazole 20 mg oral delayed release tablet 1 tab(s) orally once a day Active  pravastatin 40 mg oral tablet 0.5 tab(s) orally once a day (at bedtime) Active   Family and Social History:  Place of Living Home   Review of Systems:  Subjective/Chief Complaint Right ankle pain   Physical Exam:  GEN no acute distress   HEENT PERRL, hearing intact to voice, Oropharynx clear   RESP normal resp effort   EXTR Right ankle with valgus deformity.  Ecchymosis seen medially with fracture blisters.  Tender over medial and lateral malleolus.   SKIN Patient has fracture blisters developing over medial ankle.  She can flex and extend her toes and has intact sensation to light touch throughout the right leg.  Leg compartments are soft and compressible.  She has palpable pedal pulses.   NEURO motor/sensory function intact   PSYCH A+O to time, place, person   Lab Results: Routine Chem:  15-Dec-14 18:12   Glucose, Serum 93  BUN 9  Creatinine (comp) 1.05  Sodium, Serum 137  Potassium, Serum 3.9  Chloride, Serum 104  CO2, Serum 27   Calcium (Total), Serum 9.6  Anion Gap  6  Osmolality (calc) 272  eGFR (African American) >60  eGFR (Non-African American)  55 (eGFR values <69m/min/1.73 m2 may be an indication of chronic kidney disease (CKD). Calculated eGFR is useful in patients with stable renal function. The eGFR calculation will not be reliable in acutely ill patients when serum creatinine is changing rapidly. It is not useful in  patients on dialysis. The eGFR calculation may not be applicable to patients at the low and high extremes of body sizes, pregnant women, and vegetarians.)  Routine Hem:  15-Dec-14 18:12   WBC (CBC)  14.9  RBC (CBC) 4.69  Hemoglobin (CBC)  10.3  Hematocrit (CBC)  32.7  Platelet Count (CBC) 158 (Result(s) reported on 25 Jun 2013 at 06:31PM.)  MCV  70  MCH  21.9  MCHC  31.4  RDW  17.0   Radiology Results: XRay:    15-Dec-14 14:06, Ankle Right Complete  Ankle Right Complete  REASON FOR EXAM:    truama, fall, pain  COMMENTS:   LMP: Post-Menopausal    PROCEDURE: DXR - DXR ANKLE RIGHT COMPLETE  - Jun 25 2013  2:06PM     CLINICAL DATA:  Fall from ladder.  Pain.    EXAM:  RIGHT ANKLE - COMPLETE 3+ VIEW    COMPARISON:  None.    FINDINGS:  There are displaced fractures of the distal fibular meta diaphysis  and medial malleolus,  with lateral displacement of the talus with  respect to the distal tibia. Extensive soft tissue swelling.  Prominent plantar calcaneal spur.     IMPRESSION:  Ankle fracture dislocation, as described above.      Electronically Signed    By: Lorin Picket M.D.    On: 06/25/2013 14:15         Verified By: Luretha Rued, M.D.,    15-Dec-14 14:09, Ankle Left Complete  Ankle Left Complete  REASON FOR EXAM:    truama, fall, pain  COMMENTS:   LMP: Pre-Menstrual    PROCEDURE: DXR - DXR ANKLE LEFT COMPLETE  - Jun 25 2013  2:09PM     CLINICAL DATA:  Lateral soft tissue swelling.  [place] off of a ladder.    EXAM:  LEFT ANKLE COMPLETE - 3+  VIEW    COMPARISON:  None.    FINDINGS:  There is no evidence of fracture, dislocation, or joint effusion.  There is no evidence of arthropathy or other focal bone abnormality.  Moderate lateral soft tissue swelling. Slight talar spurring  dorsally.     IMPRESSION:  Negative.      Electronically Signed    By: Rolla Flatten M.D.    On: 06/25/2013 14:13         Verified By: Staci Righter, M.D.,    15-Dec-14 20:13, Ankle Right AP and Lateral  Ankle Right AP and Lateral  REASON FOR EXAM:    post reduction, eval alignment  COMMENTS:       PROCEDURE: DXR - DXR ANKLE RIGHT AP AND LATERAL  - Jun 25 2013  8:13PM     CLINICAL DATA:  Postreduction ankle fractures.    EXAM:  RIGHT ANKLE - 2 VIEW    COMPARISON:  06/25/2013    FINDINGS:  No significant change in position or alignment of the complex  comminuted and displaced ankle fractures.   IMPRESSION:  Complex comminuted and displaced ankle fractures.      Electronically Signed    By: Kalman Jewels M.D.    On: 06/25/2013 20:30         Verified By: Marlane Hatcher, M.D.,  [place]:    15-Dec-14 14:03, CT Head Without Contrast  PACS Image    15-Dec-14 14:06, Ankle Right Complete  PACS Image    15-Dec-14 14:09, Ankle Left Complete  PACS Image    15-Dec-14 20:13, Ankle Right AP and Lateral  PACS Image  CT:    15-Dec-14 14:03, CT Head Without Contrast  CT Head Without Contrast  REASON FOR EXAM:    trauma, fall, hematoma,  COMMENTS:   LMP: Post-Menopausal    PROCEDURE: CT  - CT HEAD WITHOUT CONTRAST  - Jun 25 2013  2:03PM     CLINICAL DATA:  Trauma.  Fall.  Hematoma.    EXAM:  CT HEAD WITHOUT CONTRAST    TECHNIQUE:  Contiguous axial images were obtained from the base of the skull  through the vertex without intravenous contrast.    COMPARISON:  None.  FINDINGS:  No acute cortical infarct, hemorrhage, or mass lesion is present.  The ventricles are of normal size. No significant extra-axial  fluid  collection is evident. The paranasal sinuses and mastoid air cells  are clear.    Minimal left supraorbital soft tissue swelling is present. There is  no underlying fracture. The globes and orbits are intact. A left  parieto-occipital scalp hematoma is present as well. There is no  underlying fracture. The  lambdoid suture is intact.     IMPRESSION:  1. Left parieto-occipital scalp hematoma without an underlying  fracture.  2. Minimal soft tissue swelling in the left supraorbital scalp  without underlying fracture.  3. Normal CT appearance of the brain.      Electronically Signed    By: Lawrence Santiago M.D.    On: 06/25/2013 14:06         Verified By: Resa Miner. MATTERN, M.D.,    Assessment/Admission Diagnosis Right bimalleolar fracture dislocation   Plan Patient underwent a closed reduction intially with an intra-articular ankle injection.  The post-reduction still showed too much valgus deformity and therefore a second ankle closed reduction was performed in the ED with conscious sedation provided by the ED attending.  Post-reduction films of the right ankle from the second reduction are pending.  Patient is being admitted for pain control and neurovascular monitoring.  She will require surgical fixation of her ankle but whether we can do this tomorrow will depend on the swelling and development of fracture blisters.  Pre-op labs are ordered.  I have also ordered a medicine consult for pre-op clearance.  She will be NPO after midnight in preparation for possible surgery tomorrow.  Patient and her husband understood and agreed with this plan.   Electronic Signatures: Thornton Park (MD)  (Signed 15-Dec-14 22:11)  Authored: CHIEF COMPLAINT and HISTORY, PAST MEDICAL/SURGIAL HISTORY, ALLERGIES, HOME MEDICATIONS, FAMILY AND SOCIAL HISTORY, REVIEW OF SYSTEMS, PHYSICAL EXAM, LABS, Radiology, ASSESSMENT AND PLAN   Last Updated: 15-Dec-14 22:11 by Thornton Park (MD)

## 2014-11-02 NOTE — Op Note (Signed)
PATIENT NAME:  Janet Rice, Janet Rice MR#:  [id number] DATE OF BIRTH:  August 04, 1945  DATE OF PROCEDURE:  04/30/2014  PREOPERATIVE DIAGNOSIS: Left carpal tunnel syndrome.   POSTOPERATIVE DIAGNOSIS: Left carpal tunnel syndrome.   PROCEDURE: Left open carpal tunnel release.   ANESTHESIA: General.   SURGEON: Dr. Thornton Park.   ESTIMATED BLOOD LOSS: Minimal.   COMPLICATIONS: None.   TOURNIQUET TIME: 33 minutes.   INDICATION FOR THE PROCEDURE: Janet Rice is a 69 year old female who has had persistent paresthesias in the thumb, index, and middle finger as well and she is beginning to develop weakness in grip and pinch strength in left hand. She had nerve conduction velocity demonstrating delay and latency of the median nerve. I therefore recommended that the patient proceed with a carpal tunnel release. I reviewed the risks and benefits of surgery with the patient including infection, nerve injury, recurrence of symptoms, wrist stiffness, wound healing problems, and the need for further surgery. Medical risks include DVT and pulmonary embolism, myocardial infarction, stroke, pneumonia, respiratory failure, and death. The patient understood these risks and wished to proceed. The patient was marked with the word "yes" over the left hand according to the hospital's right site protocol. She was then brought to the operating room where she was placed supine on the operative table. She underwent general endotracheal intubation. Her left arm was prepped and draped in a sterile fashion. A timeout was performed to verify the patient's name, date of birth, medical record number, correct site of surgery, and correct procedure to be performed. It was also used to verify the patient had received antibiotics and that all appropriate instruments were available in the room. Once all in attendance were in agreement, the case began.   The patient had lines drawn over the volar palm. One in line with Kaplan's cardinal line  and the second an extension of the radial border of the ring finger. At this intersection of these 2 lines a marker was used to draw the incision extending proximally within the patient's palmar crease. A #15 blade was used to then open this incision. The subcutaneous tissues were carefully dissected using a small Metzenbaum scissor and a pickup. All vessels were cauterized with a bipolar device. The palmar fascia was then identified. The distal extent of the palmar fascia was then found. A hemostat was then used to separate the palmar fascia to reveal the distal edge of the transverse carpal ligament. A Freer elevator was then placed underneath the transverse carpal ligament in a distal to proximal direction. This Soil scientist was placed in the carpal tunnel. A micro Beaver blade was then used to cut on top of the Soil scientist to release the transverse carpal ligament. Care was taken to watch for the recurrent motor branch of the median nerve as well as the cutaneous palmar branch of the median nerve as well. The transverse carpal ligament was then transected. The median nerve was then easily visualized. The wound was copiously irrigated and the skin reapproximated with 4-0 nylon in an interrupted fashion. Xeroform was applied along with a dry sterile dressing and a palmar splint. The patient was awakened and brought to the PACU in stable condition. I was scrubbed and present for the entire case, and all sharp and instrument counts were correct at the conclusion of the case. The tourniquet time was 33 minutes. I spoke with the patient's husband in the recovery room to let him know the case had gone without complication and the patient was  stable in the recovery room. The patient was neurovascularly intact with intact motor function in all digits of the left hand in PACU.    ____________________________ Timoteo Gaul, MD klk:lt D: 05/01/2014 19:40:22 ET T: 05/01/2014 20:23:56  ET JOB#: [id number]  cc: Timoteo Gaul, MD, <Dictator> Timoteo Gaul MD ELECTRONICALLY SIGNED 05/08/2014 15:32

## 2014-11-02 NOTE — H&P (Signed)
PATIENT NAME:  Janet Rice, Janet Rice MR#:  [id number] DATE OF BIRTH:  Mar 21, 1946  DATE OF ADMISSION:  06/25/2013  PRIMARY CARE PHYSICIAN: Dr. Ronette Deter.   REFERRING PHYSICIAN: Dr. Mack Guise.   CHIEF COMPLAINT: Medical management.   HISTORY OF PRESENT ILLNESS: Janet Rice is a 69 year old, pleasant, white female with a past medical history of hypothyroidism,  gastroesophageal reflux disease, history of metastatic melanoma. Presented to the Emergency Department after having a fall and sustaining a right bimalleolar fracture. The patient was on a ladder doing the Christmas lighting and lost balance, fell off of the ladder, started experiencing severe pain in the right ankle. Workup showed bimalleolar fracture which was reduced. Hospitalist team has been consulted for medical management. The patient has a history of gastroesophageal reflux disease and hypothyroidism. Denies having any chest pain, palpitations. The patient lives an active life. No recent cardiac workup was done.   PAST MEDICAL HISTORY:  1. Hypothyroidism.  2. Hyperlipidemia.  3. Gastroesophageal reflux disease.   4. Obesity.  5. Osteoarthritis.  6. Depression.  7. Osteoporosis.   ALLERGIES: SULFA DRUGS.    HOME MEDICATIONS:  1, Synthroid 100 mcg once a day.  2. Pravastatin 40 mg once a day.  3. Omeprazole 20 mg once a day.  4. Multivitamin 1 tablet once a day.  5. Ibuprofen 200 mg every 6 hours as needed.  6. Escitalopram 10 mg once a day.  7. Calcium with vitamin D 1 tablet once a day.   SOCIAL HISTORY: No history of smoking, drinking alcohol or using illicit drugs. Married, lives with her husband.   FAMILY HISTORY: Diabetes mellitus and hypertension.   REVIEW OF SYSTEMS:  CONSTITUTIONAL: Denies any generalized weakness.  EYES: No change in vision.  ENT: No change in hearing.  RESPIRATORY: No cough, shortness of breath.  CARDIOVASCULAR: No chest pain, palpitations.  GASTROINTESTINAL: No nausea, vomiting,  abdominal pain.  GENITOURINARY: No dysuria or hematuria.  SKIN: No rash or lesions.  MUSCULOSKELETAL: No joint pains and aches.  NEUROLOGIC: No weakness or numbness in any part of the body.   PHYSICAL EXAMINATION:  GENERAL: This is a well-built, well-nourished, age-appropriate female lying down in the bed, not in distress.  VITAL SIGNS: Temperature 99.3, pulse 86, blood pressure 114/51, respiratory rate of 20, oxygen saturation is 95% on room air.  HEENT: Head normocephalic, atraumatic. There is no scleral icterus. Conjunctivae normal. Pupils equal and react to light. Extraocular movements are intact. Mucous membranes moist. No pharyngeal erythema.  NECK: Supple. No lymphadenopathy. No JVD. No carotid bruit. No thyromegaly.  CHEST: Has no focal tenderness.  LUNGS: Bilaterally clear to auscultation.  HEART: S1, S2 regular. No murmurs are heard.  ABDOMEN: Obese. Bowel symptoms present. Soft, nontender, nondistended. No hepatosplenomegaly.  EXTREMITIES: Right lower extremity in a cast. Left: No pedal edema. Pulses 2+.  SKIN: No rash or lesions.  MUSCULOSKELETAL: Good range of motion in all of the extremities.  NEUROLOGIC: The patient is alert, oriented to place, person and time. Cranial nerves II through XII intact. Motor 5/5 in upper and lower extremities.   LABORATORIES: CBC: WBC of 14.9, hemoglobin 10.3, platelet count of 158. Right ankle shows complex comminuted and displaced ankle fracture. Repeat x-ray after the alignment: Improved alignment of the displaced medial and lateral malleolar fractures.   BMP is completely within normal limits.   CT head without contrast: No acute intracranial abnormality. Left parieto-occipital scalp hematoma without an underlying fracture. Minimal soft tissue swelling in the left supraorbital scalp without underlying fracture.  Normal CT appearance of the brain.   ASSESSMENT AND PLAN: Janet Rice is a 69 year old female who had a fall and sustained a scalp  hematoma and right bimalleolar fracture.  1. Scalp hematoma: Will continue to have close neurological checks.  2. Gastroesophageal reflux disease: Continue with omeprazole.  3. Hypothyroidism: Continue Synthroid.  4. Bimalleolar fracture: Nonweightbearing. Under the care of orthopedic surgery.  5. Keep the patient on deep vein thrombosis prophylaxis after 24 hours as the patient has a scalp hematoma.   ____________________________ Janet Becton, MD pv:gb D: 06/26/2013 01:19:43 ET T: 06/26/2013 02:06:27 ET JOB#: [id number]  cc: Janet Becton, MD, <Dictator> Grier Mitts Avin Gibbons MD ELECTRONICALLY SIGNED 07/20/2013 21:03

## 2014-11-02 NOTE — Op Note (Signed)
PATIENT NAME:  Janet Rice, Janet Rice MR#:  [id number] DATE OF BIRTH:  1945/09/25  DATE OF PROCEDURE:  07/11/2013  PREOPERATIVE DIAGNOSIS: Right trimalleolar ankle fracture with comminution of the lateral malleolus.   POSTOPERATIVE DIAGNOSIS:  Right trimalleolar ankle fracture with comminution of the lateral malleolus.  PROCEDURE:  Open reduction/internal fixation of medial and lateral malleolar fractures.   SURGEON: Thornton Park, M.D.   ANESTHESIA: General with 0.25% Marcaine plain local administered at the conclusion of the case.   ESTIMATED BLOOD LOSS: Minimal.   COMPLICATIONS: None.   TOURNIQUET TIME: 115 minutes.   IMPLANTS: Synthes 10-hole one-third tubular plate with bicortical and cancellous screws and Synthes 4.0 cannulated screws x 2 for medial malleolar fixation.   INDICATIONS FOR PROCEDURE: The patient is a 69 year old female who fell off a stepladder while putting up Christmas decorations on 06/25/2013. The patient sustained an ankle fracture-dislocation. She underwent a closed reduction in the ER, and was splinted. The patient had significant swelling along with fracture blisters. Therefore, she did not undergo immediate fixation for her fracture, but was sent home with instructions for strict elevation. She now has decreased edema and has healed her fracture blisters. I saw her in the clinic yesterday to check her skin. At that time, she also signed a consent for surgery, and the preop history and physical was performed. She understands the risks of the procedure include infection, nerve or blood vessel injury, wound healing problems, ankle arthrosis, injury to the superficial peroneal nerve leading to dorsal foot numbness, persistent ankle pain or instability, painful hardware or hardware failure, and the need for further surgery. Medical complications include, but are not limited to, DVT and pulmonary embolism, myocardial infarction, stroke, pneumonia, respiratory failure, and  death. The patient understood these risks and wished to proceed.   PROCEDURE NOTE: The patient was met in the preoperative area today. Her husband was at the bedside. I marked the right lower extremity with the word "yes", according to the hospital's right site protocol. I updated the patient's history and physical. She was then brought to the Operating Room, where she was placed supine on the operative table. A bump was placed under her right hip to allow for some internal rotation of the right lower extremity. She was then prepped and draped in a sterile fashion. A tourniquet was applied to the upper right thigh.   A timeout was performed to verify the patient's name, date of birth, medical record number, correct site of surgery, and correct procedure to be performed. It was also used to verify the patient had received antibiotics and that all appropriate instruments, implants, and radiographic studies were available in the room. Once all in attendance were in agreement, the case began.   The patient had the right lower extremity exsanguinated with an Esmarch. The tourniquet was inflated to 275 mmHg, and was inflated for a total of 115 minutes. A lateral incision was made in line with the fibula. The subcutaneous tissues were carefully dissected with a Metzenbaum scissor and pick-up. Care was taken to avoid injury to the superficial peroneal nerve. The fracture was then identified. The fracture callus and hematoma was then curetted from the fracture site. The lateral malleolus fracture was found to be in at least 4 fragments. One large oblique fracture line divided the shaft from the distal lateral malleolus, but the distal lateral malleolus also had an anterior fragment fractured off of it. In addition, the posterior aspect of the proximal fibula also had a separate fragment.  The wound was copiously irrigated. Manual reduction of the fracture was undertaken, and this was held into place with a fracture  clamp. A lag screw was then placed obliquely across the main fracture fragment. This was a 3.5 bicortical screw, and allowed for excellent compression of the fracture site. The posterior shaft fragment was then reduced. It was sutured into place with a 0 Vicryl. A Synthes 10-hole one-third tubular plate was then placed alongside the fibula. Plate benders were then used to contour the plate to the distal fibula. Three cancellous screws were then placed distal to the fracture, and 5 bicortical screws were placed proximal to the fracture. A second oblique fracture running the AP plane was used to fix the anterior fragment of the lateral malleolar fragment to the main body of the lateral malleolus. The wound was then copiously irrigated. FluoroScan images in both the AP and lateral projections were performed to confirm adequate fracture reduction and hardware position throughout fixation and hardware placement of the lateral malleolus fracture. A moist Ray-Tec was then placed in the lateral wound. The attention was then turned to the medial side.   A short oblique incision was made, centered over the medial malleolus. The fracture was then easily identified. Again, hematoma and fracture callus were curetted from the medial malleolar fracture line. The fracture was then reduced and held into place with a dental pick while 2 threaded guidewires were advanced through the tip of the medial malleolus into the distal tibia. The position of these guidewires was checked on FluoroScan imaging in both the AP and lateral planes. They were then measured with a depth gauge and overdrilled with a cannulated drill bit. The anterior medial malleolar screw was 50 mm in length and long-threaded and advanced into position by hand, and had excellent compression of the fracture site. The posterior screw was 36 mm in length, also long-threaded, and also had excellent purchase in the bone. Again, both the medial and lateral wounds were  copiously irrigated. The medial wound was closed with 2-0 Vicryl and the skin approximated with staples. Laterally, the deep fascia was closed with 0 Vicryl, the subcutaneous tissues were closed with 2-0 Vicryl, and the skin approximated with staples. A dry sterile dressing was applied over the ankle incisions. The patient was placed in an AO splint, and then awoken from anesthesia and transferred to a hospital bed, and brought to the PACU in stable condition. I was scrubbed and present for the entire case, and all sharp and instrument counts were correct at the conclusion of the case. I spoke with the patient's husband in the postop consultation room to let him know that his wife was stable in the recovery room and the case had gone without complication.      ____________________________ Timoteo Gaul, MD klk:mr D: 07/11/2013 15:02:22 ET T: 07/11/2013 19:31:30 ET JOB#: [id number]  cc: Timoteo Gaul, MD, <Dictator> Timoteo Gaul MD ELECTRONICALLY SIGNED 07/25/2013 13:52

## 2014-11-17 ENCOUNTER — Encounter: Payer: Self-pay | Admitting: Internal Medicine

## 2014-12-04 ENCOUNTER — Other Ambulatory Visit: Payer: Self-pay | Admitting: Internal Medicine

## 2014-12-26 DIAGNOSIS — M5416 Radiculopathy, lumbar region: Secondary | ICD-10-CM | POA: Diagnosis not present

## 2014-12-26 DIAGNOSIS — M5136 Other intervertebral disc degeneration, lumbar region: Secondary | ICD-10-CM | POA: Diagnosis not present

## 2015-01-02 ENCOUNTER — Ambulatory Visit: Payer: Medicare Other | Admitting: Oncology

## 2015-01-06 ENCOUNTER — Other Ambulatory Visit: Payer: Self-pay | Admitting: Internal Medicine

## 2015-01-08 ENCOUNTER — Other Ambulatory Visit: Payer: Self-pay | Admitting: *Deleted

## 2015-01-08 MED ORDER — LEVOTHYROXINE SODIUM 125 MCG PO TABS: 125.0000 ug | ORAL_TABLET | Freq: Every day | ORAL | Status: DC

## 2015-01-10 DIAGNOSIS — E039 Hypothyroidism, unspecified: Secondary | ICD-10-CM | POA: Diagnosis not present

## 2015-01-10 LAB — TSH: TSH: 0.78 u[IU]/mL (ref <=5.90)

## 2015-01-16 ENCOUNTER — Encounter: Payer: Self-pay | Admitting: *Deleted

## 2015-01-20 ENCOUNTER — Ambulatory Visit: Payer: Medicare Other | Admitting: Internal Medicine

## 2015-01-23 ENCOUNTER — Encounter: Payer: Self-pay | Admitting: Oncology

## 2015-01-23 DIAGNOSIS — D649 Anemia, unspecified: Secondary | ICD-10-CM | POA: Diagnosis not present

## 2015-01-23 DIAGNOSIS — C439 Malignant melanoma of skin, unspecified: Secondary | ICD-10-CM | POA: Diagnosis not present

## 2015-01-27 ENCOUNTER — Ambulatory Visit (INDEPENDENT_AMBULATORY_CARE_PROVIDER_SITE_OTHER): Payer: Medicare Other | Admitting: Internal Medicine

## 2015-01-27 ENCOUNTER — Encounter: Payer: Self-pay | Admitting: Internal Medicine

## 2015-01-27 VITALS — BP 110/66 | HR 89 | Temp 98.1°F | Ht 61.75 in | Wt 213.1 lb

## 2015-01-27 DIAGNOSIS — E039 Hypothyroidism, unspecified: Secondary | ICD-10-CM

## 2015-01-27 DIAGNOSIS — E785 Hyperlipidemia, unspecified: Secondary | ICD-10-CM

## 2015-01-27 DIAGNOSIS — E669 Obesity, unspecified: Secondary | ICD-10-CM | POA: Diagnosis not present

## 2015-01-27 DIAGNOSIS — M25559 Pain in unspecified hip: Secondary | ICD-10-CM

## 2015-01-27 MED ORDER — IBUPROFEN 800 MG PO TABS
800.0000 mg | ORAL_TABLET | Freq: Three times a day (TID) | ORAL | Status: DC | PRN
Start: 2015-01-27 — End: 2015-03-04

## 2015-01-27 NOTE — Patient Instructions (Signed)
Follow up in 6 months for Wellness Visit.

## 2015-01-27 NOTE — Assessment & Plan Note (Signed)
Chronic back and hip pain. Discussed risks and benefits of prn Ibuprofen. Will use prn. Continue Tramadol for severe pain.

## 2015-01-27 NOTE — Assessment & Plan Note (Signed)
Recent TSH normal. Continue Levothyroxine. 

## 2015-01-27 NOTE — Progress Notes (Addendum)
Subjective:    Patient ID: Janet Rice, female    DOB: 08-Sep-1945, 69 y.o.   MRN: [id number]  HPI  69YO female presents for follow up.  Feeling well. Continues to have some leg and back pain. Described as aching. Worse with physical activity. Improved with rest. Had ESI with Dr. Sharlet Salina in July 2016. Started taking occasional Ibuprofen 800mg  with some improvement in pain. (obtained a prescription from her daughter) Notes minimal improvement with Tramadol.   Past medical, surgical, family and social history per today's encounter.  Review of Systems  Constitutional: Negative for fever, chills, appetite change, fatigue and unexpected weight change.  Eyes: Negative for visual disturbance.  Respiratory: Negative for shortness of breath.   Cardiovascular: Negative for chest pain and leg swelling.  Gastrointestinal: Negative for nausea, vomiting, abdominal pain, diarrhea and constipation.  Musculoskeletal: Positive for myalgias, back pain and arthralgias.  Skin: Negative for color change and rash.  Hematological: Negative for adenopathy. Does not bruise/bleed easily.  Psychiatric/Behavioral: Negative for sleep disturbance and dysphoric mood. The patient is not nervous/anxious.        Objective:    BP 110/66 mmHg  Pulse 89  Temp(Src) 98.1 F (36.7 C) (Oral)  Ht 5' 1.75" (1.568 m)  Wt 213 lb 2 oz (96.673 kg)  BMI 39.32 kg/m2  SpO2 96% Physical Exam  Constitutional: She is oriented to person, place, and time. She appears well-developed and well-nourished. No distress.  HENT:  Head: Normocephalic and atraumatic.  Right Ear: External ear normal.  Left Ear: External ear normal.  Nose: Nose normal.  Mouth/Throat: Oropharynx is clear and moist. No oropharyngeal exudate.  Eyes: Conjunctivae are normal. Pupils are equal, round, and reactive to light. Right eye exhibits no discharge. Left eye exhibits no discharge. No scleral icterus.  Neck: Normal range of motion. Neck supple.  No tracheal deviation present. No thyromegaly present.  Cardiovascular: Normal rate, regular rhythm, normal heart sounds and intact distal pulses.  Exam reveals no gallop and no friction rub.   No murmur heard. Pulmonary/Chest: Effort normal and breath sounds normal. No respiratory distress. She has no wheezes. She has no rales. She exhibits no tenderness.  Musculoskeletal: Normal range of motion. She exhibits no edema or tenderness.  Lymphadenopathy:    She has no cervical adenopathy.  Neurological: She is alert and oriented to person, place, and time. No cranial nerve deficit. She exhibits normal muscle tone. Coordination normal.  Skin: Skin is warm and dry. No rash noted. She is not diaphoretic. No erythema. No pallor.  Psychiatric: She has a normal mood and affect. Her behavior is normal. Judgment and thought content normal.          Assessment & Plan:   Problem List Items Addressed This Visit      Unprioritized   Arthralgia of hip    Chronic back and hip pain. Discussed risks and benefits of prn Ibuprofen. Will use prn. Continue Tramadol for severe pain.      Hyperlipidemia - Primary    Recent LFTs performed at [place]. Will request a copy. Continue Pravastatin.      Hypothyroidism    Recent TSH normal. Continue Levothyroxine.      Obesity (BMI 30-39.9)    Wt Readings from Last 3 Encounters:  01/27/15 213 lb 2 oz (96.673 kg)  10/02/14 210 lb (95.255 kg)  09/18/14 210 lb 12.8 oz (95.618 kg)   Body mass index is 39.32 kg/(m^2). Encouraged healthy diet and exercise.  Return in about 6 months (around 07/30/2015) for Wellness Visit.

## 2015-01-27 NOTE — Assessment & Plan Note (Signed)
Recent LFTs performed at [place]. Will request a copy. Continue Pravastatin.

## 2015-01-27 NOTE — Progress Notes (Signed)
Pre visit review using our clinic review tool, if applicable. No additional management support is needed unless otherwise documented below in the visit note. 

## 2015-01-27 NOTE — Assessment & Plan Note (Signed)
Wt Readings from Last 3 Encounters:  01/27/15 213 lb 2 oz (96.673 kg)  10/02/14 210 lb (95.255 kg)  09/18/14 210 lb 12.8 oz (95.618 kg)   Body mass index is 39.32 kg/(m^2). Encouraged healthy diet and exercise.

## 2015-01-28 ENCOUNTER — Encounter: Payer: Self-pay | Admitting: Internal Medicine

## 2015-01-29 ENCOUNTER — Inpatient Hospital Stay: Payer: Medicare Other | Attending: Oncology | Admitting: Oncology

## 2015-01-29 ENCOUNTER — Encounter: Payer: Self-pay | Admitting: Oncology

## 2015-01-29 VITALS — BP 116/85 | HR 81 | Temp 97.0°F | Wt 211.4 lb

## 2015-01-29 DIAGNOSIS — D500 Iron deficiency anemia secondary to blood loss (chronic): Secondary | ICD-10-CM

## 2015-01-29 DIAGNOSIS — Z1231 Encounter for screening mammogram for malignant neoplasm of breast: Secondary | ICD-10-CM

## 2015-01-29 DIAGNOSIS — Z85820 Personal history of malignant melanoma of skin: Secondary | ICD-10-CM | POA: Diagnosis not present

## 2015-01-29 DIAGNOSIS — E039 Hypothyroidism, unspecified: Secondary | ICD-10-CM | POA: Diagnosis not present

## 2015-01-29 DIAGNOSIS — Z87891 Personal history of nicotine dependence: Secondary | ICD-10-CM | POA: Insufficient documentation

## 2015-01-29 DIAGNOSIS — E785 Hyperlipidemia, unspecified: Secondary | ICD-10-CM | POA: Diagnosis not present

## 2015-01-29 DIAGNOSIS — Z79899 Other long term (current) drug therapy: Secondary | ICD-10-CM | POA: Diagnosis not present

## 2015-01-29 DIAGNOSIS — D509 Iron deficiency anemia, unspecified: Secondary | ICD-10-CM | POA: Diagnosis not present

## 2015-01-29 DIAGNOSIS — K219 Gastro-esophageal reflux disease without esophagitis: Secondary | ICD-10-CM | POA: Insufficient documentation

## 2015-01-29 NOTE — Progress Notes (Signed)
[place] @ [facility] [phone]  Fax:(336) [place]: 1946-06-05  MR#: [id number]  WLN#:[id number]  Patient Care Team: Ronette Deter, MD as PCP - General (Internal Medicine)  CHIEF COMPLAINT:  Chief Complaint  Patient presents with  . Follow-up    Chief Complaint/Problem List:  1.  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 2.  Melanoma of left thigh status post resection in 1993, exact stage is not known to me. 3.  Left inguinal lymph node needle aspiration and biopsy with radical left inguinal lymph node dissection positive for metastatic melanoma in 1 lymph node 4.  Iron deficiency anemia, diagnosis in the June of 2012.  Colonoscopy and upper endoscopy 3 years ago.  Is been reported to be negative.  Stool for occult blood is negative.  Poor tolerance to oral iron record of colonoscopy in February of 2008 5.  Upper and lower endoscopy has been negative patient is awaiting capsule study (April, 2013)   INTERVAL HISTORY 69 year old lady came today further follow-up regarding iron deficiency anemia and, melanoma. Patient denies any abdominal pain bleeding weakness.  REVIEW OF SYSTEMS:    general status: No weakness.  Patient is feeling very well. HEENT:   No evidence of stomatitis Lungs: No cough or shortness of breath Cardiac: No chest pain or paroxysmal nocturnal dyspnea GI: No nausea no vomiting no diarrhea no abdominal pain Skin: No rash Lower extremity no swelling Neurological system: No tingling.  No numbness.  No other focal signs Musculoskeletal system no bony pains  As per HPI. Otherwise, a complete review of systems is negatve.  PAST MEDICAL HISTORY: Past Medical History  Diagnosis Date  . Hypothyroidism   . Hyperlipidemia   . Iron deficiency anemia   . Heme positive stool   . Adenomatous colon polyp   . Metastatic melanoma     Followed by Dr Oliva Bustard, previous chemo, no recurrence  . External hemorrhoids   . Chronic  heartburn     On omeprazole  . GERD (gastroesophageal reflux disease)     PAST SURGICAL HISTORY: Past Surgical History  Procedure Laterality Date  . Abdominal hysterectomy  1984  . Melanoma excision  1993    thigh  . Lymph node removal  12/2005    Left inguinal lymph node dissection  . Cholecystectomy  1990  . Colonoscopy  08/2006    Four sessile polyps found and removed in sigmoid colon at splenic flexure and ascending colon. 7 mm in size. Another removed from transverse colon 4 mm. Path Report - showed tubular adenoma and hyperplastic polyp. Advised to repeat in 3.5 years (02/2010)  . Colonoscopy  3.2.2011    8 mm polyp in sigmoid colon, removed, 4 mm polyp in descending colon, removed. Internal hemorrhoids  . Esophagogastroduodenoscopy  3.2.2011    Large hiatia hernia, esophagus normal, mulitple small sessile polyps w/no stigmata of recent bleeding found. Mildy erythermatous mucosa w/no bleeding found in gatric antrum. Normal duodenum. 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 PATH - no active inflammation, antral mucosa w/mild foveolar hyperplasia  . Orif ankle fracture Right     Dr. Mack Guise    FAMILY HISTORY Family History  Problem Relation Age of Onset  . Aneurysm Mother   . Heart disease Mother   . Leukemia Maternal Grandfather   . Skin cancer Paternal Grandfather   . Diabetes Other   . Colon polyps Other   . Colon polyps Father   . Diabetes Father   .  Colon cancer Neg Hx     ADVANCED DIRECTIVES:  No flowsheet data found.  HEALTH MAINTENANCE: History  Substance Use Topics  . Smoking status: Former Smoker -- 0.50 packs/day for 8 years    Types: Cigarettes    Quit date: 07/12/1978  . Smokeless tobacco: Never Used  . Alcohol Use: No      Allergies  Allergen Reactions  . Sulfa Antibiotics Rash    Current Outpatient Prescriptions  Medication Sig Dispense Refill  . Calcium-Magnesium-Vitamin D (CALCIUM 1200+D3 PO) Take 1 tablet by mouth daily.      . cholecalciferol (VITAMIN D) 1000 UNITS tablet Take 1,000 Units by mouth daily.    . citalopram (CELEXA) 20 MG tablet TAKE 1 TABLET DAILY 90 tablet 0  . ibuprofen (ADVIL,MOTRIN) 800 MG tablet Take 1 tablet (800 mg total) by mouth every 8 (eight) hours as needed. 30 tablet 0  . IRON, FERROUS GLUCONATE, PO Take by mouth.    . levothyroxine (SYNTHROID, LEVOTHROID) 125 MCG tablet Take 1 tablet (125 mcg total) by mouth daily before breakfast. 90 tablet 1  . Multiple Vitamin (MULTIVITAMIN) tablet Take 1 tablet by mouth daily.      Marland Kitchen omeprazole (PRILOSEC) 20 MG capsule TAKE 1 CAPSULE DAILY 90 capsule 1  . OVER THE COUNTER MEDICATION Takes 1/2 tablet Immodium every morning    . pravastatin (PRAVACHOL) 40 MG tablet TAKE 1/2 TABLET DAILY 45 tablet 1  . Probiotic Product (PROBIOTIC DAILY PO) Take by mouth.    . traMADol (ULTRAM) 50 MG tablet Take 1-2 tablets (50-100 mg total) by mouth every 8 (eight) hours as needed. 90 tablet 2   No current facility-administered medications for this visit.    OBJECTIVE:  Filed Vitals:   01/29/15 1056  BP: 116/85  Pulse: 81  Temp: 97 F (36.1 C)     Body mass index is 39.01 kg/(m^2).    ECOG FS:0 - Asymptomatic  PHYSICAL EXAM: General  status: Performance status is good.  Patient has not lost significant weight HEENT: No evidence of stomatitis. Sclera and conjunctivae :: No jaundice.   pale looking. Lungs: Air  entry equal on both sides.  No rhonchi.  No rales.  Cardiac: Heart sounds are normal.  No pericardial rub.  No murmur. Lymphatic system: Cervical, axillary, inguinal, lymph nodes not palpable GI: Abdomen is soft.  No ascites.  Liver spleen not palpable.  No tenderness.  Bowel sounds are within normal limit Lower extremity: No edema Neurological system: Higher functions, cranial nerves intact no evidence of peripheral neuropathy. Skin: No rash.  No ecchymosis.Marland Kitchen  LAB RESULTS:  No visits with results within 2 Day(s) from this visit. Latest known  visit with results is:  Abstract on 01/16/2015  Component Date Value Ref Range Status  . TSH 01/10/2015 0.78  .41 - 5.90 uIU/mL Final     STUDIES: Lab data from the outside institution dated 7/14/ 2016 Hemoglobin is 11.6.  Glucose is 110.  Platelet count is 181.  Iron saturation is low 11.  Metabolic panel is within normal limit T4 and TSH is within normal limit  ASSESSMENT: Iron deficiency anemia.  Hemoglobin is stable. Melanoma there is no evidence of recurrent disease.  MEDICAL DECISION MAKING:  All lab data has been reviewed.  There is no need for any intravenous iron.  Patient now will be followed by me in 12 months but will be evaluated by Dr. Ronette Deter in between.  If hemoglobin drops I would be happy to reevaluate this patient.  Screening mammogram has been scheduled  Patient expressed understanding and was in agreement with this plan. She also understands that She can call clinic at any time with any questions, concerns, or complaints.    No matching staging information was found for the patient.  Forest Gleason, MD   01/29/2015 11:59 AM

## 2015-01-29 NOTE — Progress Notes (Signed)
Patient does have living will.  Former smoker. 

## 2015-02-06 DIAGNOSIS — H35371 Puckering of macula, right eye: Secondary | ICD-10-CM | POA: Diagnosis not present

## 2015-02-06 DIAGNOSIS — H2513 Age-related nuclear cataract, bilateral: Secondary | ICD-10-CM | POA: Diagnosis not present

## 2015-03-04 ENCOUNTER — Other Ambulatory Visit: Payer: Self-pay | Admitting: Internal Medicine

## 2015-03-12 DIAGNOSIS — M4806 Spinal stenosis, lumbar region: Secondary | ICD-10-CM | POA: Diagnosis not present

## 2015-03-12 DIAGNOSIS — M5416 Radiculopathy, lumbar region: Secondary | ICD-10-CM | POA: Diagnosis not present

## 2015-03-12 DIAGNOSIS — M5136 Other intervertebral disc degeneration, lumbar region: Secondary | ICD-10-CM | POA: Diagnosis not present

## 2015-03-20 ENCOUNTER — Other Ambulatory Visit: Payer: Self-pay | Admitting: *Deleted

## 2015-03-20 MED ORDER — OMEPRAZOLE 20 MG PO CPDR: 20.0000 mg | DELAYED_RELEASE_CAPSULE | Freq: Every day | ORAL | Status: DC

## 2015-05-01 DIAGNOSIS — M1611 Unilateral primary osteoarthritis, right hip: Secondary | ICD-10-CM | POA: Diagnosis not present

## 2015-05-01 DIAGNOSIS — M25551 Pain in right hip: Secondary | ICD-10-CM | POA: Diagnosis not present

## 2015-05-01 DIAGNOSIS — M7061 Trochanteric bursitis, right hip: Secondary | ICD-10-CM | POA: Diagnosis not present

## 2015-05-12 ENCOUNTER — Other Ambulatory Visit: Payer: Self-pay

## 2015-05-12 MED ORDER — PRAVASTATIN SODIUM 40 MG PO TABS: 20.0000 mg | ORAL_TABLET | Freq: Every day | ORAL | Status: DC

## 2015-05-13 ENCOUNTER — Other Ambulatory Visit: Payer: Self-pay

## 2015-05-13 MED ORDER — PRAVASTATIN SODIUM 40 MG PO TABS: 20.0000 mg | ORAL_TABLET | Freq: Every day | ORAL | Status: DC

## 2015-05-14 ENCOUNTER — Ambulatory Visit (INDEPENDENT_AMBULATORY_CARE_PROVIDER_SITE_OTHER): Payer: Medicare Other | Admitting: Internal Medicine

## 2015-05-14 ENCOUNTER — Encounter: Payer: Self-pay | Admitting: Internal Medicine

## 2015-05-14 VITALS — BP 124/77 | HR 61 | Temp 98.1°F | Ht 61.75 in | Wt 208.0 lb

## 2015-05-14 DIAGNOSIS — N3000 Acute cystitis without hematuria: Secondary | ICD-10-CM | POA: Diagnosis not present

## 2015-05-14 DIAGNOSIS — R350 Frequency of micturition: Secondary | ICD-10-CM

## 2015-05-14 DIAGNOSIS — N390 Urinary tract infection, site not specified: Secondary | ICD-10-CM | POA: Insufficient documentation

## 2015-05-14 LAB — POCT URINALYSIS DIPSTICK
Bilirubin, UA: NEGATIVE
Glucose, UA: NEGATIVE
Ketones, UA: NEGATIVE
Nitrite, UA: POSITIVE
Protein, UA: NEGATIVE
Spec Grav, UA: 1.015
Urobilinogen, UA: 0.2
pH, UA: 6.5

## 2015-05-14 MED ORDER — CIPROFLOXACIN HCL 500 MG PO TABS: 500.0000 mg | ORAL_TABLET | Freq: Two times a day (BID) | ORAL | Status: DC

## 2015-05-14 MED ORDER — IBUPROFEN 800 MG PO TABS: ORAL_TABLET | ORAL | Status: DC

## 2015-05-14 NOTE — Assessment & Plan Note (Addendum)
Symptoms and UA c/w UTI. Start Cipro.(Note previous GFR 50-60)  Increase fluid intake. Azo prn. Urine sent for culture. Follow up prn if symptoms are not improving.

## 2015-05-14 NOTE — Progress Notes (Signed)
Subjective:    Patient ID: Janet Rice, female    DOB: [birth year], 69 y.o.   MRN: [id number]  HPI  69YO female presents for acute visit for possible UTI.  Urinary frequency and pressure over last few days. No fever, chills, flank pain. No gross hematuria. Not taking anything for symptoms.   Wt Readings from Last 3 Encounters:  05/14/15 208 lb (94.348 kg)  01/29/15 211 lb 6.7 oz (95.9 kg)  01/27/15 213 lb 2 oz (96.673 kg)   BP Readings from Last 3 Encounters:  05/14/15 124/77  01/29/15 116/85  01/27/15 110/66    Past Medical History  Diagnosis Date  . Hypothyroidism   . Hyperlipidemia   . Iron deficiency anemia   . Heme positive stool   . Adenomatous colon polyp   . Metastatic melanoma ([place])     Followed by Dr Oliva Bustard, previous chemo, no recurrence  . External hemorrhoids   . Chronic heartburn     On omeprazole  . GERD (gastroesophageal reflux disease)    Family History  Problem Relation Age of Onset  . Aneurysm Mother   . Heart disease Mother   . Leukemia Maternal Grandfather   . Skin cancer Paternal Grandfather   . Diabetes Other   . Colon polyps Other   . Colon polyps Father   . Diabetes Father   . Colon cancer Neg Hx    Past Surgical History  Procedure Laterality Date  . Abdominal hysterectomy  1984  . Melanoma excision  1993    thigh  . Lymph node removal  12/2005    Left inguinal lymph node dissection  . Cholecystectomy  1990  . Colonoscopy  08/2006    Four sessile polyps found and removed in sigmoid colon at splenic flexure and ascending colon. 7 mm in size. Another removed from transverse colon 4 mm. Path Report - showed tubular adenoma and hyperplastic polyp. Advised to repeat in 3.5 years (02/2010)  . Colonoscopy  3.2.2011    8 mm polyp in sigmoid colon, removed, 4 mm polyp in descending colon, removed. Internal hemorrhoids  . Esophagogastroduodenoscopy  3.2.2011    Large hiatia hernia, esophagus normal, mulitple small sessile polyps w/no  stigmata of recent bleeding found. Mildy erythermatous mucosa w/no bleeding found in gatric antrum. Normal duodenum. 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 PATH - no active inflammation, antral mucosa w/mild foveolar hyperplasia  . Orif ankle fracture Right     Dr. Mack Guise   Social History   Social History  . Marital Status: Married    Spouse Name: N/A  . Number of Children: 2  . Years of Education: N/A   Occupational History  . HR Consultant    Social History Main Topics  . Smoking status: Former Smoker -- 0.50 packs/day for 8 years    Types: Cigarettes    Quit date: 07/12/1978  . Smokeless tobacco: Never Used  . Alcohol Use: No  . Drug Use: No  . Sexual Activity: Not Asked   Other Topics Concern  . None   Social History Narrative    Review of Systems  Constitutional: Negative for fever, chills and fatigue.  Gastrointestinal: Negative for nausea, vomiting, abdominal pain, diarrhea, constipation and rectal pain.  Genitourinary: Positive for dysuria, urgency, frequency and pelvic pain. Negative for hematuria, flank pain, decreased urine volume, vaginal bleeding, vaginal discharge, difficulty urinating and vaginal pain.       Objective:    BP 124/77 mmHg  Pulse 61  Temp(Src)  98.1 F (36.7 C) (Oral)  Ht 5' 1.75" (1.568 m)  Wt 208 lb (94.348 kg)  BMI 38.37 kg/m2  SpO2 98% Physical Exam  Constitutional: She is oriented to person, place, and time. She appears well-developed and well-nourished. No distress.  HENT:  Head: Normocephalic and atraumatic.  Right Ear: External ear normal.  Left Ear: External ear normal.  Nose: Nose normal.  Mouth/Throat: Oropharynx is clear and moist. No oropharyngeal exudate.  Eyes: Conjunctivae are normal. Pupils are equal, round, and reactive to light. Right eye exhibits no discharge. Left eye exhibits no discharge. No scleral icterus.  Neck: Normal range of motion. Neck supple. No tracheal deviation present. No  thyromegaly present.  Cardiovascular: Normal rate, regular rhythm, normal heart sounds and intact distal pulses.  Exam reveals no gallop and no friction rub.   No murmur heard. Pulmonary/Chest: Effort normal and breath sounds normal. No respiratory distress. She has no wheezes. She has no rales. She exhibits no tenderness.  Abdominal: There is no tenderness (No CVA tenderness).  Musculoskeletal: Normal range of motion. She exhibits no edema or tenderness.  Lymphadenopathy:    She has no cervical adenopathy.  Neurological: She is alert and oriented to person, place, and time. No cranial nerve deficit. She exhibits normal muscle tone. Coordination normal.  Skin: Skin is warm and dry. No rash noted. She is not diaphoretic. No erythema. No pallor.  Psychiatric: She has a normal mood and affect. Her behavior is normal. Judgment and thought content normal.          Assessment & Plan:   Problem List Items Addressed This Visit      Unprioritized   UTI (urinary tract infection) - Primary    Symptoms and UA c/w UTI. Start Cipro.(Note previous GFR 50-60)  Increase fluid intake. Azo prn. Urine sent for culture. Follow up prn if symptoms are not improving.       Other Visit Diagnoses    Urinary frequency        Relevant Medications    ciprofloxacin (CIPRO) 500 MG tablet    ibuprofen (ADVIL,MOTRIN) 800 MG tablet    Other Relevant Orders    POCT Urinalysis Dipstick (Completed)    CULTURE, URINE COMPREHENSIVE        Return if symptoms worsen or fail to improve.

## 2015-05-14 NOTE — Progress Notes (Signed)
Pre visit review using our clinic review tool, if applicable. No additional management support is needed unless otherwise documented below in the visit note. 

## 2015-05-14 NOTE — Patient Instructions (Signed)

## 2015-05-16 DIAGNOSIS — M25551 Pain in right hip: Secondary | ICD-10-CM | POA: Diagnosis not present

## 2015-05-17 LAB — CULTURE, URINE COMPREHENSIVE: Colony Count: >=100000

## 2015-06-18 DIAGNOSIS — M25551 Pain in right hip: Secondary | ICD-10-CM | POA: Diagnosis not present

## 2015-06-23 ENCOUNTER — Ambulatory Visit
Admission: RE | Admit: 2015-06-23 | Discharge: 2015-06-23 | Disposition: A | Discharge status: Home / Self Care | Payer: Medicare Other | Source: Ambulatory Visit | Attending: Oncology | Admitting: Oncology

## 2015-06-23 ENCOUNTER — Other Ambulatory Visit: Payer: Self-pay | Admitting: Oncology

## 2015-06-23 DIAGNOSIS — Z1231 Encounter for screening mammogram for malignant neoplasm of breast: Secondary | ICD-10-CM | POA: Insufficient documentation

## 2015-06-25 DIAGNOSIS — M5416 Radiculopathy, lumbar region: Secondary | ICD-10-CM | POA: Diagnosis not present

## 2015-06-25 DIAGNOSIS — M5136 Other intervertebral disc degeneration, lumbar region: Secondary | ICD-10-CM | POA: Diagnosis not present

## 2015-07-04 ENCOUNTER — Other Ambulatory Visit: Payer: Self-pay | Admitting: Internal Medicine

## 2015-07-09 DIAGNOSIS — D225 Melanocytic nevi of trunk: Secondary | ICD-10-CM | POA: Diagnosis not present

## 2015-07-09 DIAGNOSIS — Z85820 Personal history of malignant melanoma of skin: Secondary | ICD-10-CM | POA: Diagnosis not present

## 2015-07-09 DIAGNOSIS — L821 Other seborrheic keratosis: Secondary | ICD-10-CM | POA: Diagnosis not present

## 2015-07-09 DIAGNOSIS — Z85828 Personal history of other malignant neoplasm of skin: Secondary | ICD-10-CM | POA: Diagnosis not present

## 2015-07-24 ENCOUNTER — Other Ambulatory Visit: Payer: Self-pay | Admitting: Internal Medicine

## 2015-08-03 ENCOUNTER — Other Ambulatory Visit: Payer: Self-pay | Admitting: Internal Medicine

## 2015-08-20 ENCOUNTER — Other Ambulatory Visit: Payer: Self-pay | Admitting: Internal Medicine

## 2015-10-01 ENCOUNTER — Telehealth: Payer: Self-pay | Admitting: Internal Medicine

## 2015-10-01 NOTE — Telephone Encounter (Signed)
Pt sent a Mychart message wanting to schedule a AWV. Thank you!

## 2015-10-08 DIAGNOSIS — M5136 Other intervertebral disc degeneration, lumbar region: Secondary | ICD-10-CM | POA: Diagnosis not present

## 2015-10-08 DIAGNOSIS — M4806 Spinal stenosis, lumbar region: Secondary | ICD-10-CM | POA: Diagnosis not present

## 2015-10-08 DIAGNOSIS — M5416 Radiculopathy, lumbar region: Secondary | ICD-10-CM | POA: Diagnosis not present

## 2015-10-15 ENCOUNTER — Ambulatory Visit (INDEPENDENT_AMBULATORY_CARE_PROVIDER_SITE_OTHER): Payer: Medicare Other

## 2015-10-15 VITALS — BP 136/78 | HR 70 | Temp 97.7°F | Resp 14 | Ht 62.0 in | Wt 215.1 lb

## 2015-10-15 DIAGNOSIS — Z1159 Encounter for screening for other viral diseases: Secondary | ICD-10-CM | POA: Diagnosis not present

## 2015-10-15 DIAGNOSIS — Z0000 Encounter for general adult medical examination without abnormal findings: Secondary | ICD-10-CM | POA: Diagnosis not present

## 2015-10-15 LAB — HEPATITIS C ANTIBODY: HCV Ab: NEGATIVE

## 2015-10-15 NOTE — Patient Instructions (Addendum)
Janet Rice , Thank you for taking time to come for your Medicare Wellness Visit. I appreciate your ongoing commitment to your health goals. Please review the following plan we discussed and let me know if I can assist you in the future.   Follow up with Dr. Gilford Rile as needed.   [facility] for blood work before upcoming physical.   This is a list of the screening recommended for you and due dates:  Health Maintenance  Topic Date Due  . Flu Shot  02/10/2016  . Mammogram  06/22/2017  . Colon Cancer Screening  10/01/2017  . Tetanus Vaccine  03/24/2022  . DEXA scan (bone density measurement)  Completed  . Shingles Vaccine  Completed  .  Hepatitis C: One time screening is recommended by Center for Disease Control  (CDC) for  adults born from [birth year] through 1965.   Completed  . Pneumonia vaccines  Completed    Hearing Loss Hearing loss is a partial or total loss of the ability to hear. This can be temporary or permanent, and it can happen in one or both ears. Hearing loss may be referred to as deafness. Medical care is necessary to treat hearing loss properly and to prevent the condition from getting worse. Your hearing may partially or completely come back, depending on what caused your hearing loss and how severe it is. In some cases, hearing loss is permanent. CAUSES Common causes of hearing loss include:   Too much wax in the ear canal.   Infection of the ear canal or middle ear.   Fluid in the middle ear.   Injury to the ear or surrounding area.   An object stuck in the ear.   Prolonged exposure to loud sounds, such as music.  Less common causes of hearing loss include:   Tumors in the ear.   Viral or bacterial infections, such as meningitis.   A hole in the eardrum (perforated eardrum).  Problems with the hearing nerve that sends signals between the brain and the ear.  Certain medicines.  SYMPTOMS  Symptoms of this condition may include:  Difficulty telling the  difference between sounds.  Difficulty following a conversation when there is background noise.  Lack of response to sounds in your environment. This may be most noticeable when you do not respond to startling sounds.  Needing to turn up the volume on the television, radio, etc.  Ringing in the ears.  Dizziness.  Pain in the ears. DIAGNOSIS This condition is diagnosed based on a physical exam and a hearing test (audiometry). The audiometry test will be performed by a hearing specialist (audiologist). You may also be referred to an ear, nose, and throat (ENT) specialist (otolaryngologist).  TREATMENT Treatment for recent onset of hearing loss may include:   Ear wax removal.   Being prescribed medicines to prevent infection (antibiotics).   Being prescribed medicines to reduce inflammation (corticosteroids).  HOME CARE INSTRUCTIONS  If you were prescribed an antibiotic medicine, take it as told by your health care provider. Do not stop taking the antibiotic even if you start to feel better.  Take over-the-counter and prescription medicines only as told by your health care provider.  Avoid loud noises.   Return to your normal activities as told by your health care provider. Ask your health care provider what activities are safe for you.  Keep all follow-up visits as told by your health care provider. This is important. SEEK MEDICAL CARE IF:   You feel dizzy.  You develop new symptoms.   You vomit or feel nauseous.   You have a fever.  SEEK IMMEDIATE MEDICAL CARE IF:  You develop sudden changes in your vision.   You have severe ear pain.   You have new or increased weakness.  You have a severe headache.   This information is not intended to replace advice given to you by your health care provider. Make sure you discuss any questions you have with your health care provider.   Document Released: 06/28/2005 Document Revised: 03/19/2015 Document Reviewed:  11/13/2014 Elsevier Interactive Patient Education [employer].

## 2015-10-15 NOTE — Progress Notes (Signed)
Subjective:   Janet Rice is a 70 y.o. female who presents for Medicare Annual (Subsequent) preventive examination.  Review of Systems:  No ROS.  Medicare Wellness Visit.  Cardiac Risk Factors include: advanced age (>26men, >76 women)     Objective:     Vitals: BP 136/78 mmHg  Pulse 70  Temp(Src) 97.7 F (36.5 C) (Oral)  Resp 14  Ht 5\' 2"  (1.575 m)  Wt 215 lb 1.9 oz (97.578 kg)  BMI 39.34 kg/m2  SpO2 95%  Body mass index is 39.34 kg/(m^2).   Tobacco History  Smoking status  . Former Smoker -- 0.50 packs/day for 8 years  . Types: Cigarettes  . Quit date: 07/12/1978  Smokeless tobacco  . Never Used     Counseling given: Not Answered   Past Medical History  Diagnosis Date  . Hypothyroidism   . Hyperlipidemia   . Iron deficiency anemia   . Heme positive stool   . Adenomatous colon polyp   . External hemorrhoids   . Chronic heartburn     On omeprazole  . GERD (gastroesophageal reflux disease)   . Metastatic melanoma ([place])     Followed by Dr Oliva Bustard, previous chemo, no recurrence   Past Surgical History  Procedure Laterality Date  . Abdominal hysterectomy  1984  . Melanoma excision  1993    thigh  . Lymph node removal  12/2005    Left inguinal lymph node dissection  . Cholecystectomy  1990  . Colonoscopy  08/2006    Four sessile polyps found and removed in sigmoid colon at splenic flexure and ascending colon. 7 mm in size. Another removed from transverse colon 4 mm. Path Report - showed tubular adenoma and hyperplastic polyp. Advised to repeat in 3.5 years (02/2010)  . Colonoscopy  3.2.2011    8 mm polyp in sigmoid colon, removed, 4 mm polyp in descending colon, removed. Internal hemorrhoids  . Esophagogastroduodenoscopy  3.2.2011    Large hiatia hernia, esophagus normal, mulitple small sessile polyps w/no stigmata of recent bleeding found. Mildy erythermatous mucosa w/no bleeding found in gatric antrum. Normal duodenum. Bx done of gastric mucoal  abnormalitiy and duodeunum. PATH - no active inflammation, antral mucosa w/mild foveolar hyperplasia  . Orif ankle fracture Right     Dr. Mack Guise   Family History  Problem Relation Age of Onset  . Aneurysm Mother   . Heart disease Mother   . Leukemia Maternal Grandfather   . Skin cancer Paternal Grandfather   . Diabetes Other   . Colon polyps Other   . Colon polyps Father   . Diabetes Father   . Colon cancer Neg Hx   . Breast cancer Neg Hx    History  Sexual Activity  . Sexual Activity: Not Currently    Outpatient Encounter Prescriptions as of 10/15/2015  Medication Sig  . b complex vitamins tablet Take 1 tablet by mouth daily.  . Calcium-Magnesium-Vitamin D (CALCIUM 1200+D3 PO) Take 1 tablet by mouth daily.  . citalopram (CELEXA) 20 MG tablet TAKE 1 TABLET DAILY  . ibuprofen (ADVIL,MOTRIN) 800 MG tablet TAKE 1 TABLET (800 MG TOTAL) BY MOUTH EVERY 8 (EIGHT) HOURS AS NEEDED.  . IRON, FERROUS GLUCONATE, PO Take by mouth.  . levothyroxine (SYNTHROID, LEVOTHROID) 125 MCG tablet TAKE 1 TABLET DAILY BEFORE BREAKFAST  . Multiple Vitamins-Minerals (EYE VITAMINS & MINERALS) TABS Take 2 tablets by mouth.  . pravastatin (PRAVACHOL) 40 MG tablet Take 0.5 tablets (20 mg total) by mouth daily.  . Probiotic  Product (PROBIOTIC DAILY PO) Take by mouth.  . [DISCONTINUED] Multiple Vitamin (MULTIVITAMIN) tablet Take 1 tablet by mouth daily.    Marland Kitchen omeprazole (PRILOSEC) 20 MG capsule Take 1 capsule (20 mg total) by mouth daily.  Marland Kitchen OVER THE COUNTER MEDICATION Reported on 10/15/2015  . [DISCONTINUED] cholecalciferol (VITAMIN D) 1000 UNITS tablet Take 1,000 Units by mouth daily. Reported on 10/15/2015  . [DISCONTINUED] ciprofloxacin (CIPRO) 500 MG tablet Take 1 tablet (500 mg total) by mouth 2 (two) times daily.  . [DISCONTINUED] omeprazole (PRILOSEC) 20 MG capsule TAKE 1 CAPSULE DAILY  . [DISCONTINUED] traMADol (ULTRAM) 50 MG tablet Take 1-2 tablets (50-100 mg total) by mouth every 8 (eight) hours as  needed.   No facility-administered encounter medications on file as of 10/15/2015.    Activities of Daily Living In your present state of health, do you have any difficulty performing the following activities: 10/15/2015  Hearing? Y  Vision? N  Difficulty concentrating or making decisions? N  Walking or climbing stairs? N  Dressing or bathing? N  Doing errands, shopping? N  Preparing Food and eating ? N  Using the Toilet? N  In the past six months, have you accidently leaked urine? N  Do you have problems with loss of bowel control? N  Managing your Medications? N  Managing your Finances? N  Housekeeping or managing your Housekeeping? N    Patient Care Team: Ronette Deter, MD as PCP - General (Internal Medicine)    Assessment:   This is a routine wellness examination for [facility]. The goal of the wellness visit is to assist the patient how to close the gaps in care and create a preventative care plan for the patient.   Taking Calcium as appropriate/Osteoporosis risk reviewed.  Medications reviewed; taking without issues or barriers.  Safety issues reviewed; smoke detectors in the home. No firearms locked in the home. Wears seatbelts when driving or riding with others. No violence in the home.  No identified risk were noted; The patient was oriented x 3; appropriate in dress and manner and no objective failures at ADL's or IADL's.  Hepatitis C Screening completed today.  Patient Concerns:  Increase in leg/back pain; chronic.  Taking Ibuprofen and receiving injections for management.  She reports the lower legs feeling like there is a "slippage" of the muscle.  R upper arm muscle pain; intermittent.  Prefers to wait for upcoming CPE in May before addressing these concerns with PCP.  Encouraged to follow up if condition worsens and as needed.  Exercise Activities and Dietary recommendations Current Exercise Habits: The patient does not participate in regular exercise at  present  Goals    . Healthy Lifestyle     Stay hydrated and drink plenty of fluids.  Increase water intake. Low carb foods.  Lean meats and vegetables. Stay active and begin daily chair exercises as demonstrated.  Educational material provided.      Fall Risk Fall Risk  10/15/2015 07/18/2014 05/16/2013  Falls in the past year? No No Yes  Number falls in past yr: - - 1  Injury with Fall? - - No   Depression Screen PHQ 2/9 Scores 10/15/2015 07/18/2014 05/16/2013 05/11/2012  PHQ - 2 Score 0 0 0 0     Cognitive Testing MMSE - Mini Mental State Exam 10/15/2015  Orientation to time 5  Orientation to Place 5  Registration 3  Attention/ Calculation 5  Recall 3  Language- name 2 objects 2  Language- repeat 1  Language- follow 3  step command 3  Language- read & follow direction 1  Write a sentence 1  Copy design 1  Total score 30    Immunization History  Administered Date(s) Administered  . Influenza Split 08/17/2011, 05/12/2014  . Influenza-Unspecified 05/16/2013, 04/11/2014  . Pneumococcal Conjugate-13 01/16/2014  . Pneumococcal Polysaccharide-23 03/24/2012  . Tdap 03/24/2012  . Zoster 08/18/2012   Screening Tests Health Maintenance  Topic Date Due  . INFLUENZA VACCINE  02/10/2016  . MAMMOGRAM  06/22/2017  . COLONOSCOPY  10/01/2017  . TETANUS/TDAP  03/24/2022  . DEXA SCAN  Completed  . ZOSTAVAX  Completed  . Hepatitis C Screening  Completed  . PNA vac Low Risk Adult  Completed      Plan:   End of life planning; Advance aging; Advanced directives discussed. Copy of current HCPOA/Living Will requested.   During the course of the visit the patient was educated and counseled about the following appropriate screening and preventive services:   Vaccines to include Pneumoccal, Influenza, Hepatitis B, Td, Zostavax, HCV  Electrocardiogram  Cardiovascular Disease  Colorectal cancer screening  Bone density screening  Diabetes screening  Glaucoma  screening  Mammography/PAP  Nutrition counseling   Patient Instructions (the written plan) was given to the patient.   Varney Biles, LPN  [id number]

## 2015-10-15 NOTE — Progress Notes (Signed)
Annual Wellness Visit as completed by [facility] was reviewed in full.  

## 2015-10-16 DIAGNOSIS — H35361 Drusen (degenerative) of macula, right eye: Secondary | ICD-10-CM | POA: Diagnosis not present

## 2015-10-16 DIAGNOSIS — H2513 Age-related nuclear cataract, bilateral: Secondary | ICD-10-CM | POA: Diagnosis not present

## 2015-10-16 DIAGNOSIS — H35371 Puckering of macula, right eye: Secondary | ICD-10-CM | POA: Diagnosis not present

## 2015-11-05 DIAGNOSIS — M7541 Impingement syndrome of right shoulder: Secondary | ICD-10-CM | POA: Diagnosis not present

## 2015-11-19 ENCOUNTER — Encounter: Payer: Medicare Other | Admitting: Internal Medicine

## 2015-12-04 ENCOUNTER — Other Ambulatory Visit: Payer: Self-pay | Admitting: Internal Medicine

## 2015-12-04 DIAGNOSIS — Z0000 Encounter for general adult medical examination without abnormal findings: Secondary | ICD-10-CM | POA: Diagnosis not present

## 2015-12-05 LAB — CBC WITH DIFFERENTIAL/PLATELET
Basophils Absolute: 0 10*3/uL (ref 0.0–0.2)
Basos: 0 %
EOS (ABSOLUTE): 0.1 10*3/uL (ref 0.0–0.4)
Eos: 2 %
Hematocrit: 31.8 % — ABNORMAL LOW (ref 34.0–46.6)
Hemoglobin: 10 g/dL — ABNORMAL LOW (ref 11.1–15.9)
Immature Grans (Abs): 0 10*3/uL (ref 0.0–0.1)
Immature Granulocytes: 0 %
Lymphocytes Absolute: 1.1 10*3/uL (ref 0.7–3.1)
Lymphs: 22 %
MCH: 26 pg — ABNORMAL LOW (ref 26.6–33.0)
MCHC: 31.4 g/dL — ABNORMAL LOW (ref 31.5–35.7)
MCV: 83 fL (ref 79–97)
Monocytes Absolute: 0.3 10*3/uL (ref 0.1–0.9)
Monocytes: 5 %
Neutrophils Absolute: 3.5 10*3/uL (ref 1.4–7.0)
Neutrophils: 71 %
Platelets: 154 10*3/uL (ref 150–379)
RBC: 3.85 x10E6/uL (ref 3.77–5.28)
RDW: 16.1 % — ABNORMAL HIGH (ref 12.3–15.4)
WBC: 5 10*3/uL (ref 3.4–10.8)

## 2015-12-05 LAB — COMPREHENSIVE METABOLIC PANEL
ALT: 33 IU/L — ABNORMAL HIGH (ref 0–32)
AST: 30 IU/L (ref 0–40)
Albumin/Globulin Ratio: 1.6 (ref 1.2–2.2)
Albumin: 4 g/dL (ref 3.6–4.8)
Alkaline Phosphatase: 117 IU/L (ref 39–117)
BUN/Creatinine Ratio: 9 — ABNORMAL LOW (ref 12–28)
BUN: 9 mg/dL (ref 8–27)
Bilirubin Total: 0.2 mg/dL (ref 0.0–1.2)
CO2: 26 mmol/L (ref 18–29)
Calcium: 9.2 mg/dL (ref 8.7–10.3)
Chloride: 100 mmol/L (ref 96–106)
Creatinine, Ser: 0.97 mg/dL (ref 0.57–1.00)
GFR calc Af Amer: 69 mL/min/{1.73_m2} (ref >59)
GFR calc non Af Amer: 60 mL/min/{1.73_m2} (ref >59)
Globulin, Total: 2.5 g/dL (ref 1.5–4.5)
Glucose: 104 mg/dL — ABNORMAL HIGH (ref 65–99)
Potassium: 5 mmol/L (ref 3.5–5.2)
Sodium: 142 mmol/L (ref 134–144)
Total Protein: 6.5 g/dL (ref 6.0–8.5)

## 2015-12-05 LAB — LIPID PANEL W/O CHOL/HDL RATIO
Cholesterol, Total: 201 mg/dL — ABNORMAL HIGH (ref 100–199)
HDL: 58 mg/dL (ref >39)
LDL Calculated: 110 mg/dL — ABNORMAL HIGH (ref 0–99)
Triglycerides: 167 mg/dL — ABNORMAL HIGH (ref 0–149)
VLDL Cholesterol Cal: 33 mg/dL (ref 5–40)

## 2015-12-05 LAB — TSH: TSH: 0.846 u[IU]/mL (ref 0.450–4.500)

## 2015-12-05 LAB — HGB A1C W/O EAG: Hgb A1c MFr Bld: 5.4 % (ref 4.8–5.6)

## 2015-12-11 ENCOUNTER — Ambulatory Visit (INDEPENDENT_AMBULATORY_CARE_PROVIDER_SITE_OTHER): Payer: Medicare Other | Admitting: Internal Medicine

## 2015-12-11 ENCOUNTER — Encounter: Payer: Self-pay | Admitting: Internal Medicine

## 2015-12-11 VITALS — BP 142/70 | HR 84 | Ht 61.25 in | Wt 209.8 lb

## 2015-12-11 DIAGNOSIS — M4806 Spinal stenosis, lumbar region: Secondary | ICD-10-CM

## 2015-12-11 DIAGNOSIS — Z0000 Encounter for general adult medical examination without abnormal findings: Secondary | ICD-10-CM

## 2015-12-11 DIAGNOSIS — M48061 Spinal stenosis, lumbar region without neurogenic claudication: Secondary | ICD-10-CM

## 2015-12-11 DIAGNOSIS — D500 Iron deficiency anemia secondary to blood loss (chronic): Secondary | ICD-10-CM

## 2015-12-11 NOTE — Progress Notes (Signed)
Subjective:    Patient ID: Janet Rice, female    DOB: Nov 06, 1945, 70 y.o.   MRN: [id number]  HPI  70YO female presents for annual exam.  Chronic back pain - Persistent, worsening back pain. Followed by Dr. Margette Fast and has had several ESI. Would like to see neurosurgery. Taking Ibuprofen with minimal improvement.  Anemia - Followed by hematology in the past. Recent Hgb dropped from 11.6 to 10. Has follow up in July 2017. Taking Fe supplement, extended release daily as directed by Dr. Oliva Bustard.  Aside from this feeling well.   Wt Readings from Last 3 Encounters:  12/11/15 209 lb 12.8 oz (95.165 kg)  10/15/15 215 lb 1.9 oz (97.578 kg)  05/14/15 208 lb (94.348 kg)   BP Readings from Last 3 Encounters:  12/11/15 142/70  10/15/15 136/78  05/14/15 124/77    Past Medical History  Diagnosis Date  . Hypothyroidism   . Hyperlipidemia   . Iron deficiency anemia   . Heme positive stool   . Adenomatous colon polyp   . External hemorrhoids   . Chronic heartburn     On omeprazole  . GERD (gastroesophageal reflux disease)   . Metastatic melanoma ([place])     Followed by Dr Oliva Bustard, previous chemo, no recurrence   Family History  Problem Relation Age of Onset  . Aneurysm Mother   . Heart disease Mother   . Leukemia Maternal Grandfather   . Skin cancer Paternal Grandfather   . Diabetes Other   . Colon polyps Other   . Colon polyps Father   . Diabetes Father   . Colon cancer Neg Hx   . Breast cancer Neg Hx    Past Surgical History  Procedure Laterality Date  . Abdominal hysterectomy  1984  . Melanoma excision  1993    thigh  . Lymph node removal  12/2005    Left inguinal lymph node dissection  . Cholecystectomy  1990  . Colonoscopy  08/2006    Four sessile polyps found and removed in sigmoid colon at splenic flexure and ascending colon. 7 mm in size. Another removed from transverse colon 4 mm. Path Report - showed tubular adenoma and hyperplastic polyp. Advised to repeat in  3.5 years (02/2010)  . Colonoscopy  3.2.2011    8 mm polyp in sigmoid colon, removed, 4 mm polyp in descending colon, removed. Internal hemorrhoids  . Esophagogastroduodenoscopy  3.2.2011    Large hiatia hernia, esophagus normal, mulitple small sessile polyps w/no stigmata of recent bleeding found. Mildy erythermatous mucosa w/no bleeding found in gatric antrum. Normal duodenum. 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 PATH - no active inflammation, antral mucosa w/mild foveolar hyperplasia  . Orif ankle fracture Right     Dr. Mack Guise   Social History   Social History  . Marital Status: Married    Spouse Name: N/A  . Number of Children: 2  . Years of Education: N/A   Occupational History  . HR Consultant    Social History Main Topics  . Smoking status: Former Smoker -- 0.50 packs/day for 8 years    Types: Cigarettes    Quit date: 07/12/1978  . Smokeless tobacco: Never Used  . Alcohol Use: No  . Drug Use: No  . Sexual Activity: Not Currently   Other Topics Concern  . None   Social History Narrative    Review of Systems  Constitutional: Negative for fever, chills, appetite change, fatigue and unexpected weight change.  Eyes: Negative for  visual disturbance.  Respiratory: Negative for cough, chest tightness and shortness of breath.   Cardiovascular: Negative for chest pain and leg swelling.  Gastrointestinal: Negative for nausea, vomiting, abdominal pain, diarrhea, constipation, blood in stool, abdominal distention and anal bleeding.  Genitourinary: Negative for dysuria.  Musculoskeletal: Positive for myalgias, back pain and arthralgias. Negative for gait problem.  Skin: Negative for color change and rash.  Neurological: Negative for weakness.  Hematological: Negative for adenopathy. Does not bruise/bleed easily.  Psychiatric/Behavioral: Negative for suicidal ideas, sleep disturbance and dysphoric mood. The patient is not nervous/anxious.        Objective:      BP 142/70 mmHg  Pulse 84  Ht 5' 1.25" (1.556 m)  Wt 209 lb 12.8 oz (95.165 kg)  BMI 39.31 kg/m2  SpO2 95% Physical Exam  Constitutional: She is oriented to person, place, and time. She appears well-developed and well-nourished. No distress.  HENT:  Head: Normocephalic and atraumatic.  Right Ear: External ear normal.  Left Ear: External ear normal.  Nose: Nose normal.  Mouth/Throat: Oropharynx is clear and moist. No oropharyngeal exudate.  Eyes: Conjunctivae are normal. Pupils are equal, round, and reactive to light. Right eye exhibits no discharge. Left eye exhibits no discharge. No scleral icterus.  Neck: Normal range of motion. Neck supple. No tracheal deviation present. No thyromegaly present.  Cardiovascular: Normal rate, regular rhythm, normal heart sounds and intact distal pulses.  Exam reveals no gallop and no friction rub.   No murmur heard. Pulmonary/Chest: Effort normal and breath sounds normal. No accessory muscle usage. No tachypnea. No respiratory distress. She has no decreased breath sounds. She has no wheezes. She has no rales. She exhibits no tenderness. Right breast exhibits no inverted nipple, no mass, no nipple discharge, no skin change and no tenderness. Left breast exhibits no inverted nipple, no mass, no nipple discharge, no skin change and no tenderness. Breasts are symmetrical.  Abdominal: Soft. Bowel sounds are normal. She exhibits no distension and no mass. There is no tenderness. There is no rebound and no guarding.  Musculoskeletal: Normal range of motion. She exhibits no edema or tenderness.  Lymphadenopathy:    She has no cervical adenopathy.  Neurological: She is alert and oriented to person, place, and time. No cranial nerve deficit. She exhibits normal muscle tone. Coordination normal.  Skin: Skin is warm and dry. No rash noted. She is not diaphoretic. No erythema. No pallor.  Psychiatric: She has a normal mood and affect. Her behavior is normal.  Judgment and thought content normal.          Assessment & Plan:   Problem List Items Addressed This Visit      Unprioritized   Anemia    Recent labs showed drop in Hgb from 11.6 to 10. She is taking iron supplement and has follow up with hematology next month. Question if IV iron might be helpful. Evaluation as to source of blood loss has been unrevealing with neg EGD and colonoscopy.      Lumbar canal stenosis    Reviewed MRI lumbar spine which showed progressive DJD and spinal stenosis. Minimal improvement with ESI. Will set up NSU evaluation. Question if surgical intervention might be helpful.      Relevant Orders   Ambulatory referral to Neurosurgery   Routine general medical examination at a health care facility - Primary    General medical exam including breast exam normal today. PAP and pelvic deferred as s/p hysterectomy. Labs reviewed with pt. Immunizations are  UTD. Colonoscopy UTD and reviewed. Mammogram UTD and reviewed. Encouraged healthy diet and exercise. Follow up 3 months and prn.          Return in about 3 months (around 03/12/2016) for Recheck.  Ronette Deter, MD Internal Medicine [facility]

## 2015-12-11 NOTE — Assessment & Plan Note (Signed)
General medical exam including breast exam normal today. PAP and pelvic deferred as s/p hysterectomy. Labs reviewed with pt. Immunizations are UTD. Colonoscopy UTD and reviewed. Mammogram UTD and reviewed. Encouraged healthy diet and exercise. Follow up 3 months and prn.

## 2015-12-11 NOTE — Assessment & Plan Note (Signed)
Reviewed MRI lumbar spine which showed progressive DJD and spinal stenosis. Minimal improvement with ESI. Will set up NSU evaluation. Question if surgical intervention might be helpful.

## 2015-12-11 NOTE — Patient Instructions (Addendum)
We will set up evaluation with Dr. Arnoldo Morale in neurosurgery. Follow up with Dr. Oliva Bustard.  Health Maintenance, Female Adopting a healthy lifestyle and getting preventive care can go a long way to promote health and wellness. Talk with your health care provider about what schedule of regular examinations is right for you. This is a good chance for you to check in with your provider about disease prevention and staying healthy. In between checkups, there are plenty of things you can do on your own. Experts have done a lot of research about which lifestyle changes and preventive measures are most likely to keep you healthy. Ask your health care provider for more information. WEIGHT AND DIET  Eat a healthy diet  Be sure to include plenty of vegetables, fruits, low-fat dairy products, and lean protein.  Do not eat a lot of foods high in solid fats, added sugars, or salt.  Get regular exercise. This is one of the most important things you can do for your health.  Most adults should exercise for at least 150 minutes each week. The exercise should increase your heart rate and make you sweat (moderate-intensity exercise).  Most adults should also do strengthening exercises at least twice a week. This is in addition to the moderate-intensity exercise.  Maintain a healthy weight  Body mass index (BMI) is a measurement that can be used to identify possible weight problems. It estimates body fat based on height and weight. Your health care provider can help determine your BMI and help you achieve or maintain a healthy weight.  For females 6 years of age and older:   A BMI below 18.5 is considered underweight.  A BMI of 18.5 to 24.9 is normal.  A BMI of 25 to 29.9 is considered overweight.  A BMI of 30 and above is considered obese.  Watch levels of cholesterol and blood lipids  You should start having your blood tested for lipids and cholesterol at 70 years of age, then have this test every 5  years.  You may need to have your cholesterol levels checked more often if:  Your lipid or cholesterol levels are high.  You are older than 70 years of age.  You are at high risk for heart disease.  CANCER SCREENING   Lung Cancer  Lung cancer screening is recommended for adults 78-35 years old who are at high risk for lung cancer because of a history of smoking.  A yearly low-dose CT scan of the lungs is recommended for people who:  Currently smoke.  Have quit within the past 15 years.  Have at least a 30-pack-year history of smoking. A pack year is smoking an average of one pack of cigarettes a day for 1 year.  Yearly screening should continue until it has been 15 years since you quit.  Yearly screening should stop if you develop a health problem that would prevent you from having lung cancer treatment.  Breast Cancer  Practice breast self-awareness. This means understanding how your breasts normally appear and feel.  It also means doing regular breast self-exams. Let your health care provider know about any changes, no matter how small.  If you are in your 20s or 30s, you should have a clinical breast exam (CBE) by a health care provider every 1-3 years as part of a regular health exam.  If you are 72 or older, have a CBE every year. Also consider having a breast X-ray (mammogram) every year.  If you have a family  history of breast cancer, talk to your health care provider about genetic screening.  If you are at high risk for breast cancer, talk to your health care provider about having an MRI and a mammogram every year.  Breast cancer gene (BRCA) assessment is recommended for women who have family members with BRCA-related cancers. BRCA-related cancers include:  Breast.  Ovarian.  Tubal.  Peritoneal cancers.  Results of the assessment will determine the need for genetic counseling and BRCA1 and BRCA2 testing. Cervical Cancer Your health care provider may  recommend that you be screened regularly for cancer of the pelvic organs (ovaries, uterus, and vagina). This screening involves a pelvic examination, including checking for microscopic changes to the surface of your cervix (Pap test). You may be encouraged to have this screening done every 3 years, beginning at age 77.  For women ages 5-65, health care providers may recommend pelvic exams and Pap testing every 3 years, or they may recommend the Pap and pelvic exam, combined with testing for human papilloma virus (HPV), every 5 years. Some types of HPV increase your risk of cervical cancer. Testing for HPV may also be done on women of any age with unclear Pap test results.  Other health care providers may not recommend any screening for nonpregnant women who are considered low risk for pelvic cancer and who do not have symptoms. Ask your health care provider if a screening pelvic exam is right for you.  If you have had past treatment for cervical cancer or a condition that could lead to cancer, you need Pap tests and screening for cancer for at least 20 years after your treatment. If Pap tests have been discontinued, your risk factors (such as having a new sexual partner) need to be reassessed to determine if screening should resume. Some women have medical problems that increase the chance of getting cervical cancer. In these cases, your health care provider may recommend more frequent screening and Pap tests. Colorectal Cancer  This type of cancer can be detected and often prevented.  Routine colorectal cancer screening usually begins at 70 years of age and continues through 70 years of age.  Your health care provider may recommend screening at an earlier age if you have risk factors for colon cancer.  Your health care provider may also recommend using home test kits to check for hidden blood in the stool.  A small camera at the end of a tube can be used to examine your colon directly  (sigmoidoscopy or colonoscopy). This is done to check for the earliest forms of colorectal cancer.  Routine screening usually begins at age 34.  Direct examination of the colon should be repeated every 5-10 years through 70 years of age. However, you may need to be screened more often if early forms of precancerous polyps or small growths are found. Skin Cancer  Check your skin from head to toe regularly.  Tell your health care provider about any new moles or changes in moles, especially if there is a change in a mole's shape or color.  Also tell your health care provider if you have a mole that is larger than the size of a pencil eraser.  Always use sunscreen. Apply sunscreen liberally and repeatedly throughout the day.  Protect yourself by wearing long sleeves, pants, a wide-brimmed hat, and sunglasses whenever you are outside. HEART DISEASE, DIABETES, AND HIGH BLOOD PRESSURE   High blood pressure causes heart disease and increases the risk of stroke. High blood pressure  is more likely to develop in:  People who have blood pressure in the high end of the normal range (130-139/85-89 mm Hg).  People who are overweight or obese.  People who are African American.  If you are 20-25 years of age, have your blood pressure checked every 3-5 years. If you are 71 years of age or older, have your blood pressure checked every year. You should have your blood pressure measured twice--once when you are at a hospital or clinic, and once when you are not at a hospital or clinic. Record the average of the two measurements. To check your blood pressure when you are not at a hospital or clinic, you can use:  An automated blood pressure machine at a pharmacy.  A home blood pressure monitor.  If you are between 41 years and 9 years old, ask your health care provider if you should take aspirin to prevent strokes.  Have regular diabetes screenings. This involves taking a blood sample to check your  fasting blood sugar level.  If you are at a normal weight and have a low risk for diabetes, have this test once every three years after 70 years of age.  If you are overweight and have a high risk for diabetes, consider being tested at a younger age or more often. PREVENTING INFECTION  Hepatitis B  If you have a higher risk for hepatitis B, you should be screened for this virus. You are considered at high risk for hepatitis B if:  You were born in a country where hepatitis B is common. Ask your health care provider which countries are considered high risk.  Your parents were born in a high-risk country, and you have not been immunized against hepatitis B (hepatitis B vaccine).  You have HIV or AIDS.  You use needles to inject street drugs.  You live with someone who has hepatitis B.  You have had sex with someone who has hepatitis B.  You get hemodialysis treatment.  You take certain medicines for conditions, including cancer, organ transplantation, and autoimmune conditions. Hepatitis C  Blood testing is recommended for:  Everyone born from [birth year] through 1965.  Anyone with known risk factors for hepatitis C. Sexually transmitted infections (STIs)  You should be screened for sexually transmitted infections (STIs) including gonorrhea and chlamydia if:  You are sexually active and are younger than 70 years of age.  You are older than 70 years of age and your health care provider tells you that you are at risk for this type of infection.  Your sexual activity has changed since you were last screened and you are at an increased risk for chlamydia or gonorrhea. Ask your health care provider if you are at risk.  If you do not have HIV, but are at risk, it may be recommended that you take a prescription medicine daily to prevent HIV infection. This is called pre-exposure prophylaxis (PrEP). You are considered at risk if:  You are sexually active and do not regularly use condoms or  know the HIV status of your partner(s).  You take drugs by injection.  You are sexually active with a partner who has HIV. Talk with your health care provider about whether you are at high risk of being infected with HIV. If you choose to begin PrEP, you should first be tested for HIV. You should then be tested every 3 months for as long as you are taking PrEP.  PREGNANCY   If you are premenopausal and  you may become pregnant, ask your health care provider about preconception counseling.  If you may become pregnant, take 400 to 800 micrograms (mcg) of folic acid every day.  If you want to prevent pregnancy, talk to your health care provider about birth control (contraception). OSTEOPOROSIS AND MENOPAUSE   Osteoporosis is a disease in which the bones lose minerals and strength with aging. This can result in serious bone fractures. Your risk for osteoporosis can be identified using a bone density scan.  If you are 71 years of age or older, or if you are at risk for osteoporosis and fractures, ask your health care provider if you should be screened.  Ask your health care provider whether you should take a calcium or vitamin D supplement to lower your risk for osteoporosis.  Menopause may have certain physical symptoms and risks.  Hormone replacement therapy may reduce some of these symptoms and risks. Talk to your health care provider about whether hormone replacement therapy is right for you.  HOME CARE INSTRUCTIONS   Schedule regular health, dental, and eye exams.  Stay current with your immunizations.   Do not use any tobacco products including cigarettes, chewing tobacco, or electronic cigarettes.  If you are pregnant, do not drink alcohol.  If you are breastfeeding, limit how much and how often you drink alcohol.  Limit alcohol intake to no more than 1 drink per day for nonpregnant women. One drink equals 12 ounces of beer, 5 ounces of wine, or 1 ounces of hard liquor.  Do  not use street drugs.  Do not share needles.  Ask your health care provider for help if you need support or information about quitting drugs.  Tell your health care provider if you often feel depressed.  Tell your health care provider if you have ever been abused or do not feel safe at home.   This information is not intended to replace advice given to you by your health care provider. Make sure you discuss any questions you have with your health care provider.   Document Released: 01/11/2011 Document Revised: 07/19/2014 Document Reviewed: 05/30/2013 Elsevier Interactive Patient Education [employer].

## 2015-12-11 NOTE — Assessment & Plan Note (Signed)
Recent labs showed drop in Hgb from 11.6 to 10. She is taking iron supplement and has follow up with hematology next month. Question if IV iron might be helpful. Evaluation as to source of blood loss has been unrevealing with neg EGD and colonoscopy.

## 2016-01-05 DIAGNOSIS — H903 Sensorineural hearing loss, bilateral: Secondary | ICD-10-CM | POA: Diagnosis not present

## 2016-01-05 DIAGNOSIS — H9311 Tinnitus, right ear: Secondary | ICD-10-CM | POA: Diagnosis not present

## 2016-01-05 DIAGNOSIS — H6981 Other specified disorders of Eustachian tube, right ear: Secondary | ICD-10-CM | POA: Diagnosis not present

## 2016-01-08 DIAGNOSIS — M5136 Other intervertebral disc degeneration, lumbar region: Secondary | ICD-10-CM | POA: Diagnosis not present

## 2016-01-08 DIAGNOSIS — M4806 Spinal stenosis, lumbar region: Secondary | ICD-10-CM | POA: Diagnosis not present

## 2016-01-08 DIAGNOSIS — M5416 Radiculopathy, lumbar region: Secondary | ICD-10-CM | POA: Diagnosis not present

## 2016-01-23 ENCOUNTER — Encounter: Payer: Self-pay | Admitting: Oncology

## 2016-01-23 DIAGNOSIS — C799 Secondary malignant neoplasm of unspecified site: Secondary | ICD-10-CM | POA: Diagnosis not present

## 2016-01-24 ENCOUNTER — Encounter: Payer: Self-pay | Admitting: Internal Medicine

## 2016-01-26 MED ORDER — LEVOTHYROXINE SODIUM 125 MCG PO TABS: 125.0000 ug | ORAL_TABLET | Freq: Every day | ORAL | Status: DC

## 2016-01-26 MED ORDER — OMEPRAZOLE 20 MG PO CPDR: 20.0000 mg | DELAYED_RELEASE_CAPSULE | Freq: Every day | ORAL | Status: DC

## 2016-01-26 MED ORDER — PRAVASTATIN SODIUM 40 MG PO TABS: 20.0000 mg | ORAL_TABLET | Freq: Every day | ORAL | Status: DC

## 2016-01-26 NOTE — Telephone Encounter (Signed)
Medication refill

## 2016-01-27 ENCOUNTER — Other Ambulatory Visit (HOSPITAL_COMMUNITY): Payer: Self-pay | Admitting: Neurosurgery

## 2016-01-27 ENCOUNTER — Other Ambulatory Visit: Payer: Self-pay | Admitting: Neurosurgery

## 2016-01-27 DIAGNOSIS — M4316 Spondylolisthesis, lumbar region: Secondary | ICD-10-CM | POA: Diagnosis not present

## 2016-01-27 DIAGNOSIS — M545 Low back pain: Secondary | ICD-10-CM | POA: Diagnosis not present

## 2016-01-27 DIAGNOSIS — M4712 Other spondylosis with myelopathy, cervical region: Secondary | ICD-10-CM

## 2016-01-27 DIAGNOSIS — M25552 Pain in left hip: Secondary | ICD-10-CM | POA: Diagnosis not present

## 2016-01-27 DIAGNOSIS — M25551 Pain in right hip: Secondary | ICD-10-CM | POA: Diagnosis not present

## 2016-01-27 DIAGNOSIS — Z6841 Body Mass Index (BMI) 40.0 and over, adult: Secondary | ICD-10-CM | POA: Diagnosis not present

## 2016-01-27 DIAGNOSIS — M5416 Radiculopathy, lumbar region: Secondary | ICD-10-CM | POA: Diagnosis not present

## 2016-01-29 ENCOUNTER — Encounter: Payer: Self-pay | Admitting: Oncology

## 2016-01-29 ENCOUNTER — Inpatient Hospital Stay: Payer: Medicare Other | Attending: Oncology | Admitting: Oncology

## 2016-01-29 VITALS — BP 124/78 | HR 80 | Temp 97.0°F | Ht 61.0 in | Wt 215.8 lb

## 2016-01-29 DIAGNOSIS — K219 Gastro-esophageal reflux disease without esophagitis: Secondary | ICD-10-CM

## 2016-01-29 DIAGNOSIS — Z85828 Personal history of other malignant neoplasm of skin: Secondary | ICD-10-CM | POA: Diagnosis not present

## 2016-01-29 DIAGNOSIS — Z87891 Personal history of nicotine dependence: Secondary | ICD-10-CM | POA: Diagnosis not present

## 2016-01-29 DIAGNOSIS — M549 Dorsalgia, unspecified: Secondary | ICD-10-CM | POA: Insufficient documentation

## 2016-01-29 DIAGNOSIS — R195 Other fecal abnormalities: Secondary | ICD-10-CM | POA: Insufficient documentation

## 2016-01-29 DIAGNOSIS — K649 Unspecified hemorrhoids: Secondary | ICD-10-CM | POA: Diagnosis not present

## 2016-01-29 DIAGNOSIS — Z806 Family history of leukemia: Secondary | ICD-10-CM | POA: Diagnosis not present

## 2016-01-29 DIAGNOSIS — E785 Hyperlipidemia, unspecified: Secondary | ICD-10-CM

## 2016-01-29 DIAGNOSIS — Z9221 Personal history of antineoplastic chemotherapy: Secondary | ICD-10-CM

## 2016-01-29 DIAGNOSIS — Z86010 Personal history of colonic polyps: Secondary | ICD-10-CM | POA: Insufficient documentation

## 2016-01-29 DIAGNOSIS — D509 Iron deficiency anemia, unspecified: Secondary | ICD-10-CM | POA: Diagnosis not present

## 2016-01-29 DIAGNOSIS — Z79899 Other long term (current) drug therapy: Secondary | ICD-10-CM | POA: Diagnosis not present

## 2016-01-29 DIAGNOSIS — R12 Heartburn: Secondary | ICD-10-CM | POA: Insufficient documentation

## 2016-01-29 DIAGNOSIS — G8929 Other chronic pain: Secondary | ICD-10-CM

## 2016-01-29 DIAGNOSIS — Z808 Family history of malignant neoplasm of other organs or systems: Secondary | ICD-10-CM | POA: Diagnosis not present

## 2016-01-29 DIAGNOSIS — E039 Hypothyroidism, unspecified: Secondary | ICD-10-CM | POA: Diagnosis not present

## 2016-01-29 DIAGNOSIS — D500 Iron deficiency anemia secondary to blood loss (chronic): Secondary | ICD-10-CM

## 2016-01-29 DIAGNOSIS — D649 Anemia, unspecified: Secondary | ICD-10-CM | POA: Diagnosis not present

## 2016-01-29 NOTE — Progress Notes (Signed)
Patient here for anemia follow up. States she always feel tired and has a lack of energy.

## 2016-01-29 NOTE — Progress Notes (Signed)
[place]  Telephone:([phone]) [phone]  Fax:(336) [phone]     Janet Rice DOB: Oct 15, 1945  MR#: [id number]  [id number]  Patient Care Team: Ronette Deter, MD as PCP - General (Internal Medicine)  CHIEF COMPLAINT:  Chief Complaint  Patient presents with  . Anemia  . Follow-up    INTERVAL HISTORY:  Patient returns to clinic as a one year follow up of anemia. She currently feels well and is asymptomatic. She does take one iron tablet daily.  Her only complaint to day is chronic back pain. She is having a MRI on July 31st and possible back surgery. She denies any unusual bleeding. No blood in stools or urine. No other concerns today.  REVIEW OF SYSTEMS:   Review of Systems  Constitutional: Negative.   HENT: Negative.   Eyes: Negative.   Respiratory: Negative.   Cardiovascular: Negative.   Gastrointestinal: Negative.   Genitourinary: Negative.   Musculoskeletal: Positive for back pain.  Skin: Negative.   Neurological: Negative.   Endo/Heme/Allergies: Negative.   Psychiatric/Behavioral: Negative.     As per HPI. Otherwise, a complete review of systems is negatve.  ONCOLOGY HISTORY:  No history exists.    PAST MEDICAL HISTORY: Past Medical History  Diagnosis Date  . Hypothyroidism   . Hyperlipidemia   . Iron deficiency anemia   . Heme positive stool   . Adenomatous colon polyp   . External hemorrhoids   . Chronic heartburn     On omeprazole  . GERD (gastroesophageal reflux disease)   . Metastatic melanoma ([place])     Followed by Dr Oliva Bustard, previous chemo, no recurrence    PAST SURGICAL HISTORY: Past Surgical History  Procedure Laterality Date  . Abdominal hysterectomy  1984  . Melanoma excision  1993    thigh  . Lymph node removal  12/2005    Left inguinal lymph node dissection  . Cholecystectomy  1990  . Colonoscopy  08/2006    Four sessile polyps found and removed in sigmoid colon at splenic flexure and ascending colon. 7 mm in size.  Another removed from transverse colon 4 mm. Path Report - showed tubular adenoma and hyperplastic polyp. Advised to repeat in 3.5 years (02/2010)  . Colonoscopy  3.2.2011    8 mm polyp in sigmoid colon, removed, 4 mm polyp in descending colon, removed. Internal hemorrhoids  . Esophagogastroduodenoscopy  3.2.2011    Large hiatia hernia, esophagus normal, mulitple small sessile polyps w/no stigmata of recent bleeding found. Mildy erythermatous mucosa w/no bleeding found in gatric antrum. Normal duodenum. 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 PATH - no active inflammation, antral mucosa w/mild foveolar hyperplasia  . Orif ankle fracture Right     Dr. Mack Guise    FAMILY HISTORY Family History  Problem Relation Age of Onset  . Aneurysm Mother   . Heart disease Mother   . Leukemia Maternal Grandfather   . Skin cancer Paternal Grandfather   . Diabetes Other   . Colon polyps Other   . Colon polyps Father   . Diabetes Father   . Colon cancer Neg Hx   . Breast cancer Neg Hx     GYNECOLOGIC HISTORY:  No LMP recorded. Patient has had a hysterectomy.     ADVANCED DIRECTIVES:    HEALTH MAINTENANCE: Social History  Substance Use Topics  . Smoking status: Former Smoker -- 0.50 packs/day for 8 years    Types: Cigarettes    Quit date: 07/12/1978  . Smokeless tobacco: Never Used  .  Alcohol Use: No    Allergies  Allergen Reactions  . Sulfa Antibiotics Rash    Current Outpatient Prescriptions  Medication Sig Dispense Refill  . b complex vitamins tablet Take 1 tablet by mouth daily.    . Calcium-Magnesium-Vitamin D (CALCIUM 1200+D3 PO) Take 1 tablet by mouth daily.    . citalopram (CELEXA) 20 MG tablet TAKE 1 TABLET DAILY 90 tablet 2  . ibuprofen (ADVIL,MOTRIN) 800 MG tablet TAKE 1 TABLET (800 MG TOTAL) BY MOUTH EVERY 8 (EIGHT) HOURS AS NEEDED. 90 tablet 3  . IRON, FERROUS GLUCONATE, PO Take by mouth.    . levothyroxine (SYNTHROID, LEVOTHROID) 125 MCG tablet Take 1  tablet (125 mcg total) by mouth daily before breakfast. 90 tablet 3  . Multiple Vitamins-Minerals (EYE VITAMINS & MINERALS) TABS Take 2 tablets by mouth.    Marland Kitchen omeprazole (PRILOSEC) 20 MG capsule Take 1 capsule (20 mg total) by mouth daily. 90 capsule 3  . OVER THE COUNTER MEDICATION Reported on 10/15/2015    . pravastatin (PRAVACHOL) 40 MG tablet Take 0.5 tablets (20 mg total) by mouth daily. 45 tablet 3  . Probiotic Product (PROBIOTIC DAILY PO) Take by mouth.     No current facility-administered medications for this visit.    OBJECTIVE: BP 124/78 mmHg  Pulse 80  Temp(Src) 97 F (36.1 C) (Tympanic)  Ht 5\' 1"  (1.549 m)  Wt 215 lb 13.3 oz (97.9 kg)  BMI 40.80 kg/m2   Body mass index is 40.8 kg/(m^2).    ECOG FS:0 - Asymptomatic  General: Well-developed, well-nourished, no acute distress. Eyes: Pink conjunctiva, anicteric sclera. HEENT: Normocephalic, moist mucous membranes, clear oropharnyx. Lungs: Clear to auscultation bilaterally. Heart: Regular rate and rhythm. No rubs, murmurs, or gallops. Abdomen: Soft, nontender, nondistended. No organomegaly noted, normoactive bowel sounds. Musculoskeletal: No edema, cyanosis, or clubbing. Neuro: Alert, answering all questions appropriately. Cranial nerves grossly intact. Skin: No rashes or petechiae noted. Psych: Normal affect. Lymphatics: No cervical, calvicular, axillary LAD.   LAB RESULTS:  No visits with results within 3 Day(s) from this visit. Latest known visit with results is:  Office Visit on 12/11/2015  Component Date Value Ref Range Status  . HM Colonoscopy 09/10/2014 Patient Reported Normal  See Report, Patient Reported Normal Final    STUDIES: No results found.  ASSESSMENT & PLAN:    1. Iron Deficiency Anemia- Diagnosed 2012. HGB today is 8.4. Patient will have iron studies and ferritin drawn tomorrow at [facility]. She will probably need scheduled for IV feraheme once the lab results are available. Call patient with  results and appointment if necessary. Otherwise follow up in 3 months with repeat labs at [facility] prior to visit.   2. 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 No evidence of recurrent disease.  3. Back Pain- Treatment per pcp. Possible surgery in the near future  Patient expressed understanding and was in agreement with this plan. She also understands that She can call clinic at any time with any questions, concerns, or complaints.   Dr. Grayland Ormond was available for consultation and review of plan of care for this patient.   Mayra Reel, NP   01/29/2016 11:47 AM

## 2016-02-02 ENCOUNTER — Other Ambulatory Visit: Payer: Self-pay | Admitting: Oncology

## 2016-02-09 ENCOUNTER — Ambulatory Visit
Admission: RE | Admit: 2016-02-09 | Discharge: 2016-02-09 | Disposition: A | Discharge status: Home / Self Care | Payer: Medicare Other | Source: Ambulatory Visit | Attending: Neurosurgery | Admitting: Neurosurgery

## 2016-02-09 DIAGNOSIS — M5136 Other intervertebral disc degeneration, lumbar region: Secondary | ICD-10-CM | POA: Diagnosis not present

## 2016-02-09 DIAGNOSIS — M4712 Other spondylosis with myelopathy, cervical region: Secondary | ICD-10-CM | POA: Insufficient documentation

## 2016-02-09 DIAGNOSIS — M50321 Other cervical disc degeneration at C4-C5 level: Secondary | ICD-10-CM | POA: Diagnosis not present

## 2016-02-09 DIAGNOSIS — M4316 Spondylolisthesis, lumbar region: Secondary | ICD-10-CM | POA: Diagnosis not present

## 2016-02-09 DIAGNOSIS — M4806 Spinal stenosis, lumbar region: Secondary | ICD-10-CM | POA: Insufficient documentation

## 2016-02-09 DIAGNOSIS — M5032 Other cervical disc degeneration, mid-cervical region, unspecified level: Secondary | ICD-10-CM | POA: Diagnosis not present

## 2016-02-09 DIAGNOSIS — M50322 Other cervical disc degeneration at C5-C6 level: Secondary | ICD-10-CM | POA: Diagnosis not present

## 2016-02-09 DIAGNOSIS — M47812 Spondylosis without myelopathy or radiculopathy, cervical region: Secondary | ICD-10-CM | POA: Diagnosis not present

## 2016-02-23 ENCOUNTER — Telehealth: Payer: Self-pay | Admitting: Oncology

## 2016-02-23 NOTE — Telephone Encounter (Signed)
Per Dr. Grayland Ormond patient to come in next week for md/feraheme. Scheduling message sent and patient aware.

## 2016-02-23 NOTE — Telephone Encounter (Signed)
Please call pt about most recent lab results. Has questions.

## 2016-02-27 DIAGNOSIS — M4316 Spondylolisthesis, lumbar region: Secondary | ICD-10-CM | POA: Diagnosis not present

## 2016-02-27 DIAGNOSIS — R030 Elevated blood-pressure reading, without diagnosis of hypertension: Secondary | ICD-10-CM | POA: Diagnosis not present

## 2016-02-27 DIAGNOSIS — Z6841 Body Mass Index (BMI) 40.0 and over, adult: Secondary | ICD-10-CM | POA: Diagnosis not present

## 2016-03-01 ENCOUNTER — Other Ambulatory Visit: Payer: Self-pay | Admitting: Neurosurgery

## 2016-03-01 NOTE — Progress Notes (Signed)
[name]  [phone]  Fax:([phone]) [place] DOB: [birth year]  MR#: [id number]  [id number]  Patient Care Team: Ronette Deter, MD (Inactive) as PCP - General (Internal Medicine)  CHIEF COMPLAINT: Iron deficiency anemia.  INTERVAL HISTORY:  Patient returns to clinic for further evaluation and consideration of additional IV Feraheme. She has mild weakness and fatigue, but otherwise feels well. She has no neurologic complaints. She denies any recent fevers or illnesses. She has good appetite and denies weight loss. She denies any chest pain or shortness of breath. She denies any nausea, vomiting, constipation, or diarrhea. She has no melena or hematochezia. She has no urinary complaints. Patient feels at her baseline and offers no specific complaints today.   REVIEW OF SYSTEMS:   Review of Systems  Constitutional: Negative.  Negative for fever, malaise/fatigue and weight loss.  HENT: Negative.   Eyes: Negative.   Respiratory: Negative.  Negative for cough and shortness of breath.   Cardiovascular: Negative.  Negative for chest pain.  Gastrointestinal: Negative.  Negative for abdominal pain, blood in stool and melena.  Genitourinary: Negative.   Musculoskeletal: Negative for back pain.  Skin: Negative.   Neurological: Negative.   Endo/Heme/Allergies: Negative.   Psychiatric/Behavioral: Negative.     As per HPI. Otherwise, a complete review of systems is negatve.  ONCOLOGY HISTORY:  No history exists.    PAST MEDICAL HISTORY: Past Medical History:  Diagnosis Date  . Adenomatous colon polyp   . Chronic heartburn    On omeprazole  . External hemorrhoids   . GERD (gastroesophageal reflux disease)   . Heme positive stool   . Hyperlipidemia   . Hypothyroidism   . Iron deficiency anemia   . Metastatic melanoma ([place])    Followed by Dr Oliva Bustard, previous chemo, no recurrence    PAST SURGICAL HISTORY: Past Surgical History:    Procedure Laterality Date  . ABDOMINAL HYSTERECTOMY  1984  . CHOLECYSTECTOMY  1990  . COLONOSCOPY  08/2006   Four sessile polyps found and removed in sigmoid colon at splenic flexure and ascending colon. 7 mm in size. Another removed from transverse colon 4 mm. Path Report - showed tubular adenoma and hyperplastic polyp. Advised to repeat in 3.5 years (02/2010)  . COLONOSCOPY  3.2.2011   8 mm polyp in sigmoid colon, removed, 4 mm polyp in descending colon, removed. Internal hemorrhoids  . ESOPHAGOGASTRODUODENOSCOPY  3.2.2011   Large hiatia hernia, esophagus normal, mulitple small sessile polyps w/no stigmata of recent bleeding found. Mildy erythermatous mucosa w/no bleeding found in gatric antrum. Normal duodenum. 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 PATH - no active inflammation, antral mucosa w/mild foveolar hyperplasia  . Lymph Node Removal  12/2005   Left inguinal lymph node dissection  . MELANOMA EXCISION  1993   thigh  . ORIF ANKLE FRACTURE Right    Dr. Mack Guise    FAMILY HISTORY Family History  Problem Relation Age of Onset  . Aneurysm Mother   . Heart disease Mother   . Leukemia Maternal Grandfather   . Skin cancer Paternal Grandfather   . Diabetes Other   . Colon polyps Other   . Colon polyps Father   . Diabetes Father   . Colon cancer Neg Hx   . Breast cancer Neg Hx     GYNECOLOGIC HISTORY:  No LMP recorded. Patient has had a hysterectomy.     ADVANCED DIRECTIVES:    HEALTH MAINTENANCE: Social History  Substance Use  Topics  . Smoking status: Former Smoker    Packs/day: 0.50    Years: 8.00    Types: Cigarettes    Quit date: 07/12/1978  . Smokeless tobacco: Never Used  . Alcohol use No    Allergies  Allergen Reactions  . Sulfa Antibiotics Rash    Current Outpatient Prescriptions  Medication Sig Dispense Refill  . Acetaminophen (TYLENOL ARTHRITIS PAIN PO) Take by mouth.    Marland Kitchen b complex vitamins tablet Take 1 tablet by mouth daily.    .  Calcium-Magnesium-Vitamin D (CALCIUM 1200+D3 PO) Take 1 tablet by mouth daily.    . citalopram (CELEXA) 20 MG tablet TAKE 1 TABLET DAILY 90 tablet 2  . ibuprofen (ADVIL,MOTRIN) 800 MG tablet TAKE 1 TABLET (800 MG TOTAL) BY MOUTH EVERY 8 (EIGHT) HOURS AS NEEDED. 90 tablet 3  . IRON, FERROUS GLUCONATE, PO Take by mouth.    . levothyroxine (SYNTHROID, LEVOTHROID) 125 MCG tablet Take 1 tablet (125 mcg total) by mouth daily before breakfast. 90 tablet 3  . Multiple Vitamins-Minerals (EYE VITAMINS & MINERALS) TABS Take 2 tablets by mouth.    Marland Kitchen omeprazole (PRILOSEC) 20 MG capsule Take 1 capsule (20 mg total) by mouth daily. 90 capsule 3  . OVER THE COUNTER MEDICATION Reported on 10/15/2015    . pravastatin (PRAVACHOL) 40 MG tablet Take 0.5 tablets (20 mg total) by mouth daily. 45 tablet 3  . Probiotic Product (PROBIOTIC DAILY PO) Take by mouth.    . traMADol (ULTRAM) 50 MG tablet Take by mouth.     No current facility-administered medications for this visit.     OBJECTIVE: BP 125/76 (BP Location: Left Arm, Patient Position: Sitting)   Pulse 74   Temp 97.4 F (36.3 C)   Resp 18   Wt 216 lb 4.3 oz (98.1 kg)   BMI 40.86 kg/m    Body mass index is 40.86 kg/m.    ECOG FS:0 - Asymptomatic  General: Well-developed, well-nourished, no acute distress. Eyes: Pink conjunctiva, anicteric sclera. HEENT: Normocephalic, moist mucous membranes, clear oropharnyx. Lungs: Clear to auscultation bilaterally. Heart: Regular rate and rhythm. No rubs, murmurs, or gallops. Abdomen: Soft, nontender, nondistended. No organomegaly noted, normoactive bowel sounds. Musculoskeletal: No edema, cyanosis, or clubbing. Neuro: Alert, answering all questions appropriately. Cranial nerves grossly intact. Skin: No rashes or petechiae noted. Psych: Normal affect. Lymphatics: No cervical, calvicular, axillary LAD.   LAB RESULTS:   STUDIES: No results found.  ASSESSMENT & PLAN:    1. Iron deficiency anemia: Patient  reportedly had a normal colonoscopy in March 2016. Her hemoglobin and iron stores have trended down, therefore proceed with 510 mg IV Feraheme today. Return to clinic in 1 week for a second infusion. Patient will then return to clinic in 3 months with repeat laboratory work and further evaluation. Patient gets her labwork completed at [facility].  2. History of melanoma: Melanoma of left thigh status post resection in 1993, exact stage is not known, metastatic to inguinal lymph node. No evidence of recurrent disease. 3. Back Pain: Continue evaluation and treatment per PCP.   Patient expressed understanding and was in agreement with this plan. She also understands that She can call clinic at any time with any questions, concerns, or complaints.     Lloyd Huger, MD   03/03/2016 9:56 PM

## 2016-03-02 ENCOUNTER — Inpatient Hospital Stay: Payer: Medicare Other

## 2016-03-02 ENCOUNTER — Inpatient Hospital Stay: Payer: Medicare Other | Attending: Oncology | Admitting: Oncology

## 2016-03-02 VITALS — BP 119/70 | HR 76 | Resp 20

## 2016-03-02 VITALS — BP 125/76 | HR 74 | Temp 97.4°F | Resp 18 | Wt 216.3 lb

## 2016-03-02 DIAGNOSIS — E039 Hypothyroidism, unspecified: Secondary | ICD-10-CM | POA: Insufficient documentation

## 2016-03-02 DIAGNOSIS — Z85820 Personal history of malignant melanoma of skin: Secondary | ICD-10-CM | POA: Diagnosis not present

## 2016-03-02 DIAGNOSIS — K219 Gastro-esophageal reflux disease without esophagitis: Secondary | ICD-10-CM | POA: Diagnosis not present

## 2016-03-02 DIAGNOSIS — Z79899 Other long term (current) drug therapy: Secondary | ICD-10-CM

## 2016-03-02 DIAGNOSIS — Z87891 Personal history of nicotine dependence: Secondary | ICD-10-CM | POA: Insufficient documentation

## 2016-03-02 DIAGNOSIS — D500 Iron deficiency anemia secondary to blood loss (chronic): Secondary | ICD-10-CM

## 2016-03-02 DIAGNOSIS — E785 Hyperlipidemia, unspecified: Secondary | ICD-10-CM | POA: Diagnosis not present

## 2016-03-02 DIAGNOSIS — D509 Iron deficiency anemia, unspecified: Secondary | ICD-10-CM | POA: Insufficient documentation

## 2016-03-02 MED ORDER — SODIUM CHLORIDE 0.9 % IV SOLN
510.0000 mg | Freq: Once | INTRAVENOUS | Status: AC
  Administered 2016-03-02: 510 mg via INTRAVENOUS
  Filled 2016-03-02: qty 17

## 2016-03-02 MED ORDER — SODIUM CHLORIDE 0.9 % IV SOLN
Freq: Once | INTRAVENOUS | Status: AC
  Administered 2016-03-02: 16:00:00 via INTRAVENOUS
  Filled 2016-03-02: qty 1000

## 2016-03-02 NOTE — Progress Notes (Signed)
Patient is here for follow up, former choksi patient.

## 2016-03-09 ENCOUNTER — Inpatient Hospital Stay: Payer: Medicare Other

## 2016-03-09 VITALS — BP 116/63 | HR 65 | Temp 96.9°F | Resp 20

## 2016-03-09 DIAGNOSIS — E039 Hypothyroidism, unspecified: Secondary | ICD-10-CM | POA: Diagnosis not present

## 2016-03-09 DIAGNOSIS — Z79899 Other long term (current) drug therapy: Secondary | ICD-10-CM | POA: Diagnosis not present

## 2016-03-09 DIAGNOSIS — D509 Iron deficiency anemia, unspecified: Secondary | ICD-10-CM | POA: Diagnosis not present

## 2016-03-09 DIAGNOSIS — K219 Gastro-esophageal reflux disease without esophagitis: Secondary | ICD-10-CM | POA: Diagnosis not present

## 2016-03-09 DIAGNOSIS — E785 Hyperlipidemia, unspecified: Secondary | ICD-10-CM | POA: Diagnosis not present

## 2016-03-09 DIAGNOSIS — D500 Iron deficiency anemia secondary to blood loss (chronic): Secondary | ICD-10-CM

## 2016-03-09 DIAGNOSIS — Z85820 Personal history of malignant melanoma of skin: Secondary | ICD-10-CM | POA: Diagnosis not present

## 2016-03-09 MED ORDER — FERUMOXYTOL INJECTION 510 MG/17 ML
510.0000 mg | Freq: Once | INTRAVENOUS | Status: AC
  Administered 2016-03-09: 510 mg via INTRAVENOUS
  Filled 2016-03-09: qty 17

## 2016-03-09 MED ORDER — SODIUM CHLORIDE 0.9 % IV SOLN
Freq: Once | INTRAVENOUS | Status: AC
  Administered 2016-03-09: 14:00:00 via INTRAVENOUS
  Filled 2016-03-09: qty 1000

## 2016-03-12 HISTORY — PX: BACK SURGERY: SHX140

## 2016-03-16 ENCOUNTER — Ambulatory Visit (INDEPENDENT_AMBULATORY_CARE_PROVIDER_SITE_OTHER): Payer: Medicare Other | Admitting: Family Medicine

## 2016-03-16 ENCOUNTER — Encounter: Payer: Self-pay | Admitting: Family Medicine

## 2016-03-16 ENCOUNTER — Ambulatory Visit: Payer: Medicare Other | Admitting: Internal Medicine

## 2016-03-16 DIAGNOSIS — E039 Hypothyroidism, unspecified: Secondary | ICD-10-CM | POA: Diagnosis not present

## 2016-03-16 DIAGNOSIS — E785 Hyperlipidemia, unspecified: Secondary | ICD-10-CM | POA: Diagnosis not present

## 2016-03-16 DIAGNOSIS — M810 Age-related osteoporosis without current pathological fracture: Secondary | ICD-10-CM | POA: Diagnosis not present

## 2016-03-16 DIAGNOSIS — D509 Iron deficiency anemia, unspecified: Secondary | ICD-10-CM

## 2016-03-16 NOTE — Progress Notes (Signed)
Pre visit review using our clinic review tool, if applicable. No additional management support is needed unless otherwise documented below in the visit note. 

## 2016-03-16 NOTE — Assessment & Plan Note (Signed)
Stable. Continue Iron supplementation and IV iron infusions.

## 2016-03-16 NOTE — Assessment & Plan Note (Signed)
Uncontrolled. Discussed repeating Dexa. Patient elected to wait.

## 2016-03-16 NOTE — Patient Instructions (Signed)
Consider increasing your pravachol and getting a bone density.  Good luck on your surgery.  I'll see you at the beginning of 2018.  Take care  Dr. Lacinda Axon

## 2016-03-16 NOTE — Assessment & Plan Note (Signed)
Uncontrolled. Discussed changing statin and patient declined. Advised increase in current statin dose and patient also declined.

## 2016-03-16 NOTE — Progress Notes (Signed)
Subjective:  Patient ID: Janet Rice, female    DOB: Oct 30, 1945  Age: 70 y.o. MRN: [id number]  CC: Follow up  HPI:  70 year old female with HLD, Anemia, Hypothyroidism, and Osteoporosis presents for follow-up.  HLD  Uncontrolled.  On pravachol.  Will discuss today.  Anemia  Stable. Followed by hematology. Receiving IV iron infusions.  Hypothyroidism  Stable on Synthroid 125 MCG.  Osteoporosis  Untreated.  Patient currently taking calcium and vitamin D. No other treatment.  We'll discuss today.  Social Hx   Social History   Social History  . Marital status: Married    Spouse name: N/A  . Number of children: 2  . Years of education: N/A   Occupational History  . 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 Smoking status: Former Smoker    Packs/day: 0.50    Years: 8.00    Types: Cigarettes    Quit date: 07/12/1978  . Smokeless tobacco: Never Used  . Alcohol use No  . Drug use: No  . Sexual activity: Not Currently   Other Topics Concern  . None   Social History Narrative  . None    Review of Systems  Constitutional: Negative.   Musculoskeletal: Positive for back pain.   Objective:  BP 137/75 (BP Location: Left Arm, Patient Position: Sitting, Cuff Size: Normal)   Pulse 85   Temp 98.3 F (36.8 C) (Oral)   Wt 214 lb 8 oz (97.3 kg)   SpO2 94%   BMI 40.53 kg/m   BP/Weight 03/16/2016 03/09/2016 [id number]  Systolic BP 0000000 [id number]  Diastolic BP 75 63 70  Wt. (Lbs) 214.5 - -  BMI 40.53 - -   Physical Exam  Constitutional: She is oriented to person, place, and time. She appears well-developed. No distress.  Cardiovascular: Normal rate and regular rhythm.   Pulmonary/Chest: Effort normal. She has no wheezes. She has no rales.  Neurological: She is alert and oriented to person, place, and time.  Psychiatric: She has a normal mood and affect.  Vitals reviewed.   Lab Results  Component Value Date   WBC 5.0 12/04/2015   HGB  11.4 (L) 12/12/2013   HCT 31.8 (L) 12/04/2015   PLT 154 12/04/2015   GLUCOSE 104 (H) 12/04/2015   CHOL 201 (H) 12/04/2015   TRIG 167 (H) 12/04/2015   HDL 58 12/04/2015   LDLCALC 110 (H) 12/04/2015   ALT 33 (H) 12/04/2015   AST 30 12/04/2015   NA 142 12/04/2015   K 5.0 12/04/2015   CL 100 12/04/2015   CREATININE 0.97 12/04/2015   BUN 9 12/04/2015   CO2 26 12/04/2015   TSH 0.846 12/04/2015   INR 1.0 07/10/2013   HGBA1C 5.4 12/04/2015    Assessment & Plan:   Problem List Items Addressed This Visit    Iron deficiency anemia    Stable. Continue Iron supplementation and IV iron infusions.      Hyperlipidemia    Uncontrolled. Discussed changing statin and patient declined. Advised increase in current statin dose and patient also declined.      Hypothyroidism    Stable. Continue current synthroid dose.      Osteoporosis    Uncontrolled. Discussed repeating Dexa. Patient elected to wait.       Other Visit Diagnoses   None.     No orders of the defined types were placed in this encounter.   Follow-up: Return for Follow up Chronic medical issues.  Michael Boston  [place]

## 2016-03-16 NOTE — Assessment & Plan Note (Signed)
Stable. Continue current synthroid dose.

## 2016-03-17 ENCOUNTER — Other Ambulatory Visit: Payer: Self-pay | Admitting: Family Medicine

## 2016-03-17 MED ORDER — CITALOPRAM HYDROBROMIDE 20 MG PO TABS
20.0000 mg | ORAL_TABLET | Freq: Every day | ORAL | 2 refills | Status: DC
Start: 2016-03-17 — End: 2016-12-05

## 2016-03-25 ENCOUNTER — Encounter (HOSPITAL_COMMUNITY): Payer: Self-pay

## 2016-03-25 NOTE — Pre-Procedure Instructions (Signed)
Janet Rice  03/25/2016      [id number] [place], [place] - [address] [place] [address] Phone: [phone] Fax: [phone]  [facility] [place] [place] [facility] [address] [place] [address] Phone: [phone] Fax: [phone]    Your procedure is scheduled on Thursday September 28.  Report to [facility] Admitting at 9:30 A.M.  Call this number if you have problems the morning of surgery:  [phone]   Remember:  Do not eat food or drink liquids after midnight.  Take these medicines the morning of surgery with A SIP OF WATER: acetaminophen (tylenol) if needed, citalopram (celexa), levothyroxine (synthroid), omeprazole (prilosec)  7 days prior to surgery STOP taking any Aspirin, Aleve, Naproxen, Ibuprofen, Motrin, Advil, Goody's, BC's, all herbal medications, fish oil, and all vitamins (Calcium and Vitamin D, B complex vitamin, multivitamin, Glucosamine, turmeric)    Do not wear jewelry, make-up or nail polish.  Do not wear lotions, powders, or perfumes, or deoderant.  Do not shave 48 hours prior to surgery.  Men may shave face and neck.  Do not bring valuables to the hospital.  [facility] is not responsible for any belongings or valuables.  Contacts, dentures or bridgework may not be worn into surgery.  Leave your suitcase in the car.  After surgery it may be brought to your room.  For patients admitted to the hospital, discharge time will be determined by your treatment team.  Patients discharged the day of surgery will not be allowed to drive home.    Special instructions:    [facility]- Preparing For Surgery  Before surgery, you can play an important role. Because skin is not sterile, your skin needs to be as free of germs as possible. You can reduce the number of germs on your skin by washing with CHG (chlorahexidine gluconate) Soap before  surgery.  CHG is an antiseptic cleaner which kills germs and bonds with the skin to continue killing germs even after washing.  Please do not use if you have an allergy to CHG or antibacterial soaps. If your skin becomes reddened/irritated stop using the CHG.  Do not shave (including legs and underarms) for at least 48 hours prior to first CHG shower. It is OK to shave your face.  Please follow these instructions carefully.   1. Shower the NIGHT BEFORE SURGERY and the MORNING OF SURGERY with CHG.   2. If you chose to wash your hair, wash your hair first as usual with your normal shampoo.  3. After you shampoo, rinse your hair and body thoroughly to remove the shampoo.  4. Use CHG as you would any other liquid soap. You can apply CHG directly to the skin and wash gently with a scrungie or a clean washcloth.   5. Apply the CHG Soap to your body ONLY FROM THE NECK DOWN.  Do not use on open wounds or open sores. Avoid contact with your eyes, ears, mouth and genitals (private parts). Wash genitals (private parts) with your normal soap.  6. Wash thoroughly, paying special attention to the area where your surgery will be performed.  7. Thoroughly rinse your body with warm water from the neck down.  8. DO NOT shower/wash with your normal soap after using and rinsing off the CHG Soap.  9. Pat yourself dry with a CLEAN TOWEL.   10. Wear  CLEAN PAJAMAS   11. Place CLEAN SHEETS on your bed the night of your first shower and DO NOT SLEEP WITH PETS.    Day of Surgery: Do not apply any deodorants/lotions. Please wear clean clothes to the hospital/surgery center.      Please read over the following fact sheets that you were given. MRSA Information

## 2016-03-26 ENCOUNTER — Encounter (HOSPITAL_COMMUNITY)
Admission: RE | Admit: 2016-03-26 | Discharge: 2016-03-26 | Disposition: A | Discharge status: Home / Self Care | Payer: Medicare Other | Source: Ambulatory Visit | Attending: Neurosurgery | Admitting: Neurosurgery

## 2016-03-26 ENCOUNTER — Encounter (HOSPITAL_COMMUNITY): Payer: Self-pay

## 2016-03-26 DIAGNOSIS — Z01818 Encounter for other preprocedural examination: Secondary | ICD-10-CM | POA: Diagnosis not present

## 2016-03-26 HISTORY — DX: Unspecified asthma, uncomplicated: J45.909

## 2016-03-26 HISTORY — DX: Unspecified osteoarthritis, unspecified site: M19.90

## 2016-03-26 LAB — SURGICAL PCR SCREEN
MRSA, PCR: NEGATIVE
Staphylococcus aureus: NEGATIVE

## 2016-03-26 LAB — TYPE AND SCREEN
ABO/RH(D): AB NEG
Antibody Screen: NEGATIVE

## 2016-03-26 LAB — BASIC METABOLIC PANEL
Anion gap: 10 (ref 5–15)
BUN: 11 mg/dL (ref 6–20)
CO2: 25 mmol/L (ref 22–32)
Calcium: 9.3 mg/dL (ref 8.9–10.3)
Chloride: 105 mmol/L (ref 101–111)
Creatinine, Ser: 0.95 mg/dL (ref 0.44–1.00)
GFR calc Af Amer: >60 mL/min (ref >60)
GFR calc non Af Amer: 60 mL/min — ABNORMAL LOW (ref >60)
Glucose, Bld: 91 mg/dL (ref 65–99)
Potassium: 4.9 mmol/L (ref 3.5–5.1)
Sodium: 140 mmol/L (ref 135–145)

## 2016-03-26 LAB — CBC
HCT: 35.7 % — ABNORMAL LOW (ref 36.0–46.0)
Hemoglobin: 10.8 g/dL — ABNORMAL LOW (ref 12.0–15.0)
MCH: 26.2 pg (ref 26.0–34.0)
MCHC: 30.3 g/dL (ref 30.0–36.0)
MCV: 86.4 fL (ref 78.0–100.0)
Platelets: 134 10*3/uL — ABNORMAL LOW (ref 150–400)
RBC: 4.13 MIL/uL (ref 3.87–5.11)
RDW: 19 % — ABNORMAL HIGH (ref 11.5–15.5)
WBC: 6.7 10*3/uL (ref 4.0–10.5)

## 2016-03-26 LAB — ABO/RH: ABO/RH(D): AB NEG

## 2016-03-26 NOTE — Progress Notes (Signed)
PCP: Janet Rice No cardiologist or cardiac history per patient.  Stress test: 09/30/10 per chart  Pt with no complaints of chest pain, SOB or signs of infection.

## 2016-04-08 ENCOUNTER — Inpatient Hospital Stay (HOSPITAL_COMMUNITY): Payer: Medicare Other

## 2016-04-08 ENCOUNTER — Encounter (HOSPITAL_COMMUNITY)
Admission: RE | Disposition: A | Discharge status: Home Health | Payer: Self-pay | Source: Ambulatory Visit | Attending: Neurosurgery

## 2016-04-08 ENCOUNTER — Encounter (HOSPITAL_COMMUNITY): Payer: Self-pay | Admitting: *Deleted

## 2016-04-08 ENCOUNTER — Inpatient Hospital Stay (HOSPITAL_COMMUNITY): Payer: Medicare Other | Admitting: Anesthesiology

## 2016-04-08 ENCOUNTER — Inpatient Hospital Stay (HOSPITAL_COMMUNITY)
Admission: RE | Admit: 2016-04-08 | Discharge: 2016-04-10 | DRG: 460 | Disposition: A | Discharge status: Home Health | Payer: Medicare Other | Source: Ambulatory Visit | Attending: Neurosurgery | Admitting: Neurosurgery

## 2016-04-08 DIAGNOSIS — Z806 Family history of leukemia: Secondary | ICD-10-CM

## 2016-04-08 DIAGNOSIS — K219 Gastro-esophageal reflux disease without esophagitis: Secondary | ICD-10-CM | POA: Diagnosis present

## 2016-04-08 DIAGNOSIS — M419 Scoliosis, unspecified: Secondary | ICD-10-CM | POA: Diagnosis not present

## 2016-04-08 DIAGNOSIS — M47816 Spondylosis without myelopathy or radiculopathy, lumbar region: Secondary | ICD-10-CM | POA: Diagnosis not present

## 2016-04-08 DIAGNOSIS — Z7982 Long term (current) use of aspirin: Secondary | ICD-10-CM | POA: Diagnosis not present

## 2016-04-08 DIAGNOSIS — E785 Hyperlipidemia, unspecified: Secondary | ICD-10-CM | POA: Diagnosis not present

## 2016-04-08 DIAGNOSIS — M5116 Intervertebral disc disorders with radiculopathy, lumbar region: Secondary | ICD-10-CM | POA: Diagnosis not present

## 2016-04-08 DIAGNOSIS — Z981 Arthrodesis status: Secondary | ICD-10-CM | POA: Diagnosis not present

## 2016-04-08 DIAGNOSIS — M4186 Other forms of scoliosis, lumbar region: Secondary | ICD-10-CM | POA: Diagnosis not present

## 2016-04-08 DIAGNOSIS — Z87891 Personal history of nicotine dependence: Secondary | ICD-10-CM

## 2016-04-08 DIAGNOSIS — Z85820 Personal history of malignant melanoma of skin: Secondary | ICD-10-CM | POA: Diagnosis not present

## 2016-04-08 DIAGNOSIS — Z808 Family history of malignant neoplasm of other organs or systems: Secondary | ICD-10-CM | POA: Diagnosis not present

## 2016-04-08 DIAGNOSIS — E039 Hypothyroidism, unspecified: Secondary | ICD-10-CM | POA: Diagnosis not present

## 2016-04-08 DIAGNOSIS — M4316 Spondylolisthesis, lumbar region: Secondary | ICD-10-CM | POA: Diagnosis present

## 2016-04-08 DIAGNOSIS — D509 Iron deficiency anemia, unspecified: Secondary | ICD-10-CM | POA: Diagnosis not present

## 2016-04-08 DIAGNOSIS — M1990 Unspecified osteoarthritis, unspecified site: Secondary | ICD-10-CM | POA: Diagnosis not present

## 2016-04-08 DIAGNOSIS — M4806 Spinal stenosis, lumbar region: Secondary | ICD-10-CM | POA: Diagnosis not present

## 2016-04-08 DIAGNOSIS — M79606 Pain in leg, unspecified: Secondary | ICD-10-CM | POA: Diagnosis not present

## 2016-04-08 DIAGNOSIS — Z833 Family history of diabetes mellitus: Secondary | ICD-10-CM | POA: Diagnosis not present

## 2016-04-08 DIAGNOSIS — Z419 Encounter for procedure for purposes other than remedying health state, unspecified: Secondary | ICD-10-CM

## 2016-04-08 SURGERY — POSTERIOR LUMBAR FUSION 1 LEVEL
Anesthesia: General | Site: Back

## 2016-04-08 MED ORDER — HYDROCODONE-ACETAMINOPHEN 5-325 MG PO TABS
1.0000 | ORAL_TABLET | ORAL | Status: DC | PRN
  Administered 2016-04-08: 1 via ORAL
  Administered 2016-04-08 – 2016-04-10 (×8): 2 via ORAL
  Filled 2016-04-08 (×8): qty 2

## 2016-04-08 MED ORDER — SODIUM CHLORIDE 0.9 % IR SOLN
Status: DC | PRN
  Administered 2016-04-08: 13:00:00

## 2016-04-08 MED ORDER — PROMETHAZINE HCL 25 MG/ML IJ SOLN: 6.2500 mg | INTRAMUSCULAR | Status: DC | PRN

## 2016-04-08 MED ORDER — PROPOFOL 10 MG/ML IV BOLUS
INTRAVENOUS | Status: DC | PRN
  Administered 2016-04-08: 130 mg via INTRAVENOUS
  Administered 2016-04-08: 40 mg via INTRAVENOUS

## 2016-04-08 MED ORDER — VANCOMYCIN HCL 1000 MG IV SOLR
INTRAVENOUS | Status: AC
  Filled 2016-04-08: qty 1000

## 2016-04-08 MED ORDER — MIDAZOLAM HCL 2 MG/2ML IJ SOLN
INTRAMUSCULAR | Status: AC
  Filled 2016-04-08: qty 2

## 2016-04-08 MED ORDER — FERROUS SULFATE 325 (65 FE) MG PO TABS
325.0000 mg | ORAL_TABLET | Freq: Every day | ORAL | Status: DC
  Administered 2016-04-09 – 2016-04-10 (×2): 325 mg via ORAL
  Filled 2016-04-08 (×2): qty 1

## 2016-04-08 MED ORDER — ONDANSETRON HCL 4 MG/2ML IJ SOLN
4.0000 mg | INTRAMUSCULAR | Status: DC | PRN
  Administered 2016-04-09: 4 mg via INTRAVENOUS
  Filled 2016-04-08: qty 2

## 2016-04-08 MED ORDER — ROCURONIUM BROMIDE 10 MG/ML (PF) SYRINGE
PREFILLED_SYRINGE | INTRAVENOUS | Status: AC
  Filled 2016-04-08: qty 10

## 2016-04-08 MED ORDER — ACETAMINOPHEN 650 MG RE SUPP: 650.0000 mg | RECTAL | Status: DC | PRN

## 2016-04-08 MED ORDER — CEFAZOLIN SODIUM-DEXTROSE 2-4 GM/100ML-% IV SOLN
2.0000 g | INTRAVENOUS | Status: AC
  Administered 2016-04-08: 2 g via INTRAVENOUS

## 2016-04-08 MED ORDER — PHENYLEPHRINE HCL 10 MG/ML IJ SOLN
INTRAMUSCULAR | Status: DC | PRN
  Administered 2016-04-08 (×2): 40 ug via INTRAVENOUS

## 2016-04-08 MED ORDER — LEVOTHYROXINE SODIUM 125 MCG PO TABS
125.0000 ug | ORAL_TABLET | Freq: Every day | ORAL | Status: DC
  Administered 2016-04-09 – 2016-04-10 (×2): 125 ug via ORAL
  Filled 2016-04-08 (×2): qty 1

## 2016-04-08 MED ORDER — LIDOCAINE 2% (20 MG/ML) 5 ML SYRINGE
INTRAMUSCULAR | Status: AC
  Filled 2016-04-08: qty 5

## 2016-04-08 MED ORDER — PHENYLEPHRINE HCL 10 MG/ML IJ SOLN
INTRAVENOUS | Status: DC | PRN
  Administered 2016-04-08: 25 ug/min via INTRAVENOUS

## 2016-04-08 MED ORDER — ACETAMINOPHEN 325 MG PO TABS: 650.0000 mg | ORAL_TABLET | ORAL | Status: DC | PRN

## 2016-04-08 MED ORDER — PROPOFOL 10 MG/ML IV BOLUS
INTRAVENOUS | Status: AC
  Filled 2016-04-08: qty 20

## 2016-04-08 MED ORDER — SURGIFOAM 100 EX MISC
CUTANEOUS | Status: DC | PRN
  Administered 2016-04-08 (×2): via TOPICAL

## 2016-04-08 MED ORDER — VANCOMYCIN HCL 1000 MG IV SOLR
INTRAVENOUS | Status: DC | PRN
  Administered 2016-04-08: 1000 mg via TOPICAL

## 2016-04-08 MED ORDER — ONDANSETRON HCL 4 MG/2ML IJ SOLN
INTRAMUSCULAR | Status: DC | PRN
  Administered 2016-04-08: 4 mg via INTRAVENOUS

## 2016-04-08 MED ORDER — SUGAMMADEX SODIUM 200 MG/2ML IV SOLN
INTRAVENOUS | Status: DC | PRN
  Administered 2016-04-08: 200 mg via INTRAVENOUS

## 2016-04-08 MED ORDER — ONDANSETRON HCL 4 MG/2ML IJ SOLN
INTRAMUSCULAR | Status: AC
  Filled 2016-04-08: qty 2

## 2016-04-08 MED ORDER — FENTANYL CITRATE (PF) 100 MCG/2ML IJ SOLN
25.0000 ug | INTRAMUSCULAR | Status: DC | PRN
  Administered 2016-04-08: 25 ug via INTRAVENOUS
  Administered 2016-04-08: 50 ug via INTRAVENOUS

## 2016-04-08 MED ORDER — CITALOPRAM HYDROBROMIDE 20 MG PO TABS
20.0000 mg | ORAL_TABLET | Freq: Every day | ORAL | Status: DC
  Administered 2016-04-09 – 2016-04-10 (×2): 20 mg via ORAL
  Filled 2016-04-08 (×3): qty 1

## 2016-04-08 MED ORDER — CHLORHEXIDINE GLUCONATE CLOTH 2 % EX PADS: 6.0000 | MEDICATED_PAD | Freq: Once | CUTANEOUS | Status: DC

## 2016-04-08 MED ORDER — LACTATED RINGERS IV SOLN: INTRAVENOUS | Status: DC

## 2016-04-08 MED ORDER — BUPIVACAINE LIPOSOME 1.3 % IJ SUSP
20.0000 mL | INTRAMUSCULAR | Status: DC
  Filled 2016-04-08: qty 20

## 2016-04-08 MED ORDER — PHENYLEPHRINE 40 MCG/ML (10ML) SYRINGE FOR IV PUSH (FOR BLOOD PRESSURE SUPPORT)
PREFILLED_SYRINGE | INTRAVENOUS | Status: AC
  Filled 2016-04-08: qty 10

## 2016-04-08 MED ORDER — PANTOPRAZOLE SODIUM 40 MG PO TBEC
40.0000 mg | DELAYED_RELEASE_TABLET | Freq: Every day | ORAL | Status: DC
  Administered 2016-04-09 – 2016-04-10 (×2): 40 mg via ORAL
  Filled 2016-04-08 (×2): qty 1

## 2016-04-08 MED ORDER — FENTANYL CITRATE (PF) 100 MCG/2ML IJ SOLN
INTRAMUSCULAR | Status: AC
  Filled 2016-04-08: qty 2

## 2016-04-08 MED ORDER — BUPIVACAINE LIPOSOME 1.3 % IJ SUSP
INTRAMUSCULAR | Status: DC | PRN
  Administered 2016-04-08: 20 mL

## 2016-04-08 MED ORDER — PRAVASTATIN SODIUM 40 MG PO TABS
20.0000 mg | ORAL_TABLET | Freq: Every day | ORAL | Status: DC
  Administered 2016-04-09 – 2016-04-10 (×2): 20 mg via ORAL
  Filled 2016-04-08 (×2): qty 1

## 2016-04-08 MED ORDER — ACETAMINOPHEN 10 MG/ML IV SOLN
INTRAVENOUS | Status: DC | PRN
  Administered 2016-04-08: 1000 mg via INTRAVENOUS

## 2016-04-08 MED ORDER — FENTANYL CITRATE (PF) 100 MCG/2ML IJ SOLN
INTRAMUSCULAR | Status: AC
  Filled 2016-04-08: qty 4

## 2016-04-08 MED ORDER — ALUM & MAG HYDROXIDE-SIMETH 200-200-20 MG/5ML PO SUSP: 30.0000 mL | Freq: Four times a day (QID) | ORAL | Status: DC | PRN

## 2016-04-08 MED ORDER — OXYCODONE-ACETAMINOPHEN 5-325 MG PO TABS: 1.0000 | ORAL_TABLET | ORAL | Status: DC | PRN

## 2016-04-08 MED ORDER — LACTATED RINGERS IV SOLN
INTRAVENOUS | Status: DC
Start: 2016-04-08 — End: 2016-04-08
  Administered 2016-04-08 (×2): via INTRAVENOUS

## 2016-04-08 MED ORDER — CEFAZOLIN SODIUM-DEXTROSE 2-4 GM/100ML-% IV SOLN
INTRAVENOUS | Status: AC
  Filled 2016-04-08: qty 100

## 2016-04-08 MED ORDER — DOCUSATE SODIUM 100 MG PO CAPS
100.0000 mg | ORAL_CAPSULE | Freq: Two times a day (BID) | ORAL | Status: DC
  Administered 2016-04-08 – 2016-04-10 (×4): 100 mg via ORAL
  Filled 2016-04-08 (×4): qty 1

## 2016-04-08 MED ORDER — MORPHINE SULFATE (PF) 2 MG/ML IV SOLN: 1.0000 mg | INTRAVENOUS | Status: DC | PRN

## 2016-04-08 MED ORDER — BACITRACIN ZINC 500 UNIT/GM EX OINT
TOPICAL_OINTMENT | CUTANEOUS | Status: DC | PRN
  Administered 2016-04-08: 1 via TOPICAL

## 2016-04-08 MED ORDER — MIDAZOLAM HCL 5 MG/5ML IJ SOLN
INTRAMUSCULAR | Status: DC | PRN
  Administered 2016-04-08: 1 mg via INTRAVENOUS

## 2016-04-08 MED ORDER — 0.9 % SODIUM CHLORIDE (POUR BTL) OPTIME
TOPICAL | Status: DC | PRN
  Administered 2016-04-08: 1000 mL

## 2016-04-08 MED ORDER — BISACODYL 10 MG RE SUPP: 10.0000 mg | Freq: Every day | RECTAL | Status: DC | PRN

## 2016-04-08 MED ORDER — CEFAZOLIN SODIUM-DEXTROSE 2-4 GM/100ML-% IV SOLN
2.0000 g | Freq: Three times a day (TID) | INTRAVENOUS | Status: AC
  Administered 2016-04-08 – 2016-04-09 (×2): 2 g via INTRAVENOUS
  Filled 2016-04-08 (×2): qty 100

## 2016-04-08 MED ORDER — DIAZEPAM 5 MG PO TABS
5.0000 mg | ORAL_TABLET | Freq: Four times a day (QID) | ORAL | Status: DC | PRN
  Administered 2016-04-09 (×3): 5 mg via ORAL
  Filled 2016-04-08 (×4): qty 1

## 2016-04-08 MED ORDER — ROCURONIUM BROMIDE 100 MG/10ML IV SOLN
INTRAVENOUS | Status: DC | PRN
  Administered 2016-04-08: 20 mg via INTRAVENOUS
  Administered 2016-04-08 (×2): 40 mg via INTRAVENOUS

## 2016-04-08 MED ORDER — MENTHOL 3 MG MT LOZG: 1.0000 | LOZENGE | OROMUCOSAL | Status: DC | PRN

## 2016-04-08 MED ORDER — ACETAMINOPHEN 10 MG/ML IV SOLN
INTRAVENOUS | Status: AC
  Filled 2016-04-08: qty 100

## 2016-04-08 MED ORDER — FENTANYL CITRATE (PF) 100 MCG/2ML IJ SOLN
INTRAMUSCULAR | Status: DC | PRN
  Administered 2016-04-08 (×5): 50 ug via INTRAVENOUS

## 2016-04-08 MED ORDER — BUPIVACAINE-EPINEPHRINE (PF) 0.5% -1:200000 IJ SOLN
INTRAMUSCULAR | Status: DC | PRN
  Administered 2016-04-08: 10 mL via PERINEURAL

## 2016-04-08 MED ORDER — LIDOCAINE HCL (CARDIAC) 20 MG/ML IV SOLN
INTRAVENOUS | Status: DC | PRN
  Administered 2016-04-08: 80 mg via INTRAVENOUS

## 2016-04-08 MED ORDER — PHENOL 1.4 % MT LIQD
1.0000 | OROMUCOSAL | Status: DC | PRN
Start: 2016-04-08 — End: 2016-04-10

## 2016-04-08 MED ORDER — HYDROCODONE-ACETAMINOPHEN 5-325 MG PO TABS
ORAL_TABLET | ORAL | Status: AC
  Filled 2016-04-08: qty 1

## 2016-04-08 SURGICAL SUPPLY — 63 items
BAG DECANTER FOR FLEXI CONT (MISCELLANEOUS) ×2 IMPLANT
BENZOIN TINCTURE PRP APPL 2/3 (GAUZE/BANDAGES/DRESSINGS) ×2 IMPLANT
BLADE CLIPPER SURG (BLADE) IMPLANT
BUR MATCHSTICK NEURO 3.0 LAGG (BURR) ×4 IMPLANT
BUR PRECISION FLUTE 5.0 (BURR) ×2 IMPLANT
BUR PRECISION FLUTE 6.0 (BURR) ×6 IMPLANT
CAGE ALTERA 10X31X9-13 15D (Cage) ×2 IMPLANT
CANISTER SUCT 3000ML PPV (MISCELLANEOUS) ×2 IMPLANT
CAP REVERE LOCKING (Cap) ×8 IMPLANT
CONT SPEC 4OZ CLIKSEAL STRL BL (MISCELLANEOUS) ×2 IMPLANT
COVER BACK TABLE 60X90IN (DRAPES) ×2 IMPLANT
COVER TABLE BACK 60X90 (DRAPES) ×2 IMPLANT
DRAPE C-ARM 42X72 X-RAY (DRAPES) ×4 IMPLANT
DRAPE HALF SHEET 40X57 (DRAPES) ×4 IMPLANT
DRAPE LAPAROTOMY 100X72X124 (DRAPES) ×2 IMPLANT
DRAPE POUCH INSTRU U-SHP 10X18 (DRAPES) ×2 IMPLANT
DRAPE SURG 17X23 STRL (DRAPES) ×8 IMPLANT
ELECT BLADE 4.0 EZ CLEAN MEGAD (MISCELLANEOUS) ×2
ELECT REM PT RETURN 9FT ADLT (ELECTROSURGICAL) ×2
ELECTRODE BLDE 4.0 EZ CLN MEGD (MISCELLANEOUS) ×1 IMPLANT
ELECTRODE REM PT RTRN 9FT ADLT (ELECTROSURGICAL) ×1 IMPLANT
EVACUATOR 1/8 PVC DRAIN (DRAIN) IMPLANT
GAUZE SPONGE 4X4 12PLY STRL (GAUZE/BANDAGES/DRESSINGS) ×2 IMPLANT
GAUZE SPONGE 4X4 16PLY XRAY LF (GAUZE/BANDAGES/DRESSINGS) ×4 IMPLANT
GLOVE BIO SURGEON STRL SZ8 (GLOVE) ×6 IMPLANT
GLOVE BIO SURGEON STRL SZ8.5 (GLOVE) ×4 IMPLANT
GLOVE EXAM NITRILE LRG STRL (GLOVE) IMPLANT
GLOVE EXAM NITRILE XL STR (GLOVE) IMPLANT
GLOVE EXAM NITRILE XS STR PU (GLOVE) IMPLANT
GLOVE INDICATOR 7.0 STRL GRN (GLOVE) ×2 IMPLANT
GLOVE SURG SS PI 6.5 STRL IVOR (GLOVE) ×4 IMPLANT
GOWN STRL REUS W/ TWL LRG LVL3 (GOWN DISPOSABLE) ×2 IMPLANT
GOWN STRL REUS W/ TWL XL LVL3 (GOWN DISPOSABLE) ×3 IMPLANT
GOWN STRL REUS W/TWL 2XL LVL3 (GOWN DISPOSABLE) IMPLANT
GOWN STRL REUS W/TWL LRG LVL3 (GOWN DISPOSABLE) ×2
GOWN STRL REUS W/TWL XL LVL3 (GOWN DISPOSABLE) ×3
KIT BASIN OR (CUSTOM PROCEDURE TRAY) ×2 IMPLANT
KIT ROOM TURNOVER OR (KITS) ×2 IMPLANT
NEEDLE HYPO 21X1.5 SAFETY (NEEDLE) ×2 IMPLANT
NEEDLE HYPO 22GX1.5 SAFETY (NEEDLE) ×4 IMPLANT
NS IRRIG 1000ML POUR BTL (IV SOLUTION) ×2 IMPLANT
PACK LAMINECTOMY NEURO (CUSTOM PROCEDURE TRAY) ×2 IMPLANT
PAD ARMBOARD 7.5X6 YLW CONV (MISCELLANEOUS) ×8 IMPLANT
PATTIES SURGICAL .5 X.5 (GAUZE/BANDAGES/DRESSINGS) ×2 IMPLANT
PATTIES SURGICAL .5 X1 (DISPOSABLE) IMPLANT
PATTIES SURGICAL 1X1 (DISPOSABLE) ×2 IMPLANT
ROD REVERE 6.35 40MM (Rod) ×2 IMPLANT
ROD REVERE 6.35 45MM (Rod) ×2 IMPLANT
SCREW 7.5X50MM (Screw) ×8 IMPLANT
SPONGE LAP 4X18 X RAY DECT (DISPOSABLE) IMPLANT
SPONGE NEURO XRAY DETECT 1X3 (DISPOSABLE) IMPLANT
SPONGE SURGIFOAM ABS GEL 100 (HEMOSTASIS) ×4 IMPLANT
STRIP BIOACTIVE 20CC 25X100X8 (Miscellaneous) ×2 IMPLANT
STRIP CLOSURE SKIN 1/2X4 (GAUZE/BANDAGES/DRESSINGS) ×2 IMPLANT
SUT VIC AB 1 CT1 18XBRD ANBCTR (SUTURE) ×3 IMPLANT
SUT VIC AB 1 CT1 8-18 (SUTURE) ×3
SUT VIC AB 2-0 CP2 18 (SUTURE) ×4 IMPLANT
SYR 20ML ECCENTRIC (SYRINGE) ×2 IMPLANT
TAPE CLOTH SURG 4X10 WHT LF (GAUZE/BANDAGES/DRESSINGS) ×2 IMPLANT
TOWEL OR 17X24 6PK STRL BLUE (TOWEL DISPOSABLE) ×4 IMPLANT
TOWEL OR 17X26 10 PK STRL BLUE (TOWEL DISPOSABLE) ×2 IMPLANT
TRAY FOLEY W/METER SILVER 16FR (SET/KITS/TRAYS/PACK) ×2 IMPLANT
WATER STERILE IRR 1000ML POUR (IV SOLUTION) ×2 IMPLANT

## 2016-04-08 NOTE — Anesthesia Procedure Notes (Addendum)
Procedure Name: Intubation Date/Time: 04/08/2016 11:49 AM Performed by: Clearnce Sorrel Pre-anesthesia Checklist: Patient identified, Emergency Drugs available, Suction available and Patient being monitored Patient Re-evaluated:Patient Re-evaluated prior to inductionOxygen Delivery Method: Circle System Utilized Preoxygenation: Pre-oxygenation with 100% oxygen Intubation Type: IV induction Ventilation: Mask ventilation without difficulty Laryngoscope Size: Mac and 3 Grade View: Grade I Tube type: Oral Tube size: 7.5 mm Number of attempts: 2 (No ETCO2. ETT taken out, MMV, then DL x2 with MAC 3, grade 1 view) Airway Equipment and Method: Stylet and Oral airway Placement Confirmation: ETT inserted through vocal cords under direct vision,  positive ETCO2 and breath sounds checked- equal and bilateral Secured at: 22 cm Tube secured with: Tape Dental Injury: Teeth and Oropharynx as per pre-operative assessment

## 2016-04-08 NOTE — Anesthesia Postprocedure Evaluation (Signed)
Anesthesia Post Note  Patient: Janet Rice  Procedure(s) Performed: Procedure(s) (LRB): POSTERIOR LUMBAR FUSION, INTERBODY PROSTHESIS, POSTERIOR NON-SEGMENTAL INSTRUMENTATION, POSTERIOR LATERAL ARTHRODESIS LUMBAR FOUR- LUMBAR FIVE (N/A)  Patient location during evaluation: PACU Anesthesia Type: General Level of consciousness: awake and alert Pain management: pain level controlled Vital Signs Assessment: post-procedure vital signs reviewed and stable Respiratory status: spontaneous breathing, nonlabored ventilation, respiratory function stable and patient connected to nasal cannula oxygen Cardiovascular status: blood pressure returned to baseline and stable Postop Assessment: no signs of nausea or vomiting Anesthetic complications: no    Last Vitals:  Vitals:   04/08/16 1645 04/08/16 1700  BP: 124/71 (!) 122/96  Pulse:  75  Resp:  (!) 25  Temp:  36.4 C    Last Pain:  Vitals:   04/08/16 1700  TempSrc:   PainSc: 3                  Catalina Gravel

## 2016-04-08 NOTE — Op Note (Signed)
Brief history: The patient is a 70 year old white female who has complained of back and leg pain consistent with a lumbar radiculopathy. She has failed medical management and was worked up with a lumbar MRI. This demonstrated the patient had a lumbar scoliosis and an L4-5 spondylolisthesis with spinal stenosis. I discussed the situation with the patient. We discussed the various treatment options. She has decided to proceed with a lumbar decompression and fusion after weighing the risks, benefits, and alternatives.  Preoperative diagnosis: L4-5 spondylolisthesis, Degenerative disc disease, spinal stenosis compressing both the L4 and the L5 nerve roots; lumbago; lumbar radiculopathy; lumbar scoliosis  Postoperative diagnosis: As above   Procedure: Bilateral L4-5 Laminotomy/foraminotomies to decompress the bilateral L4 and L5 nerve roots(the work required to do this was in addition to the work required to do the posterior lumbar interbody fusion because of the patient's spinal stenosis, facet arthropathy. Etc. requiring a wide decompression of the nerve roots.); L4-5 transforaminal lumbar interbody fusion with local morselized autograft bone and Kinnex graft extender; insertion of interbody prosthesis at L4-5 (globus peek expandable interbody prosthesis); posterior nonsegmental instrumentation from L4 to L5 with globus titanium pedicle screws and rods; posterior lateral arthrodesis at L4-5 with local morselized autograft bone and Kinnex bone graft extender.  Surgeon: Dr. Earle Gell  Asst.: Dr. Sherley Bounds  Anesthesia: Gen. endotracheal  Estimated blood loss: 250 mL  Drains: None  Complications: None  Description of procedure: The patient was brought to the operating room by the anesthesia team. General endotracheal anesthesia was induced. The patient was turned to the prone position on the Wilson frame. The patient's lumbosacral region was then prepared with Betadine scrub and Betadine solution.  Sterile drapes were applied.  I then injected the area to be incised with Marcaine with epinephrine solution. I then used the scalpel to make a linear midline incision over the L4-5 interspace. I then used electrocautery to perform a bilateral subperiosteal dissection exposing the spinous process and lamina of L4 and L5. We then obtained intraoperative radiograph to confirm our location. We then inserted the Verstrac retractor to provide exposure.  I began the decompression by using the high speed drill to perform laminotomies at L4-5 bilaterally. We then used the Kerrison punches to widen the laminotomy and removed the ligamentum flavum at L4-5 bilaterally. We used the Kerrison punches to remove the medial facets at L4-5 bilaterally. We performed wide foraminotomies about the bilateral L4 and L5 nerve roots completing the decompression.  We now turned our attention to the posterior lumbar interbody fusion. I used a scalpel to incise the intervertebral disc at L4-5 bilaterally. I then performed a partial intervertebral discectomy at L4-5 bilaterally using the pituitary forceps. We prepared the vertebral endplates at 075-GRM bilaterally for the fusion by removing the soft tissues with the curettes. We then used the trial spacers to pick the appropriate sized interbody prosthesis. We prefilled his prosthesis with a combination of local morselized autograft bone that we obtained during the decompression as well as Kinnex bone graft extender. We inserted the prefilled prosthesis into the interspace at L4-5, I then expanded the prosthesis. There was a good snug fit of the prosthesis in the interspace. We then filled and the remainder of the intervertebral disc space with local morselized autograft bone and Kinnex. This completed the posterior lumbar interbody arthrodesis.  We now turned attention to the instrumentation. Under fluoroscopic guidance we cannulated the bilateral L4 and L5 pedicles with the bone probe.  We then removed the bone probe. We  then tapped the pedicle with a 6.5 millimeter tap. We then removed the tap. We probed inside the tapped pedicle with a ball probe to rule out cortical breaches. We then inserted a 7.5 x 50 millimeter pedicle screw into the L4 and L5 pedicles bilaterally under fluoroscopic guidance. We then palpated along the medial aspect of the pedicles to rule out cortical breaches. There were none. The nerve roots were not injured. We then connected the unilateral pedicle screws with a lordotic rod. We compressed the construct and secured the rod in place with the caps. We then tightened the caps appropriately. This completed the instrumentation from L4-5.  We now turned our attention to the posterior lateral arthrodesis at L4-5. We used the high-speed drill to decorticate the remainder of the facets, pars, transverse process at L4-5. We then applied a combination of local morselized autograft bone and Kinnex bone graft extender over these decorticated posterior lateral structures. This completed the posterior lateral arthrodesis.  We then obtained hemostasis using bipolar electrocautery. We irrigated the wound out with bacitracin solution. We inspected the thecal sac and nerve roots and noted they were well decompressed. We then removed the retractor. We placed vancomycin powder in the wound. We reapproximated patient's thoracolumbar fascia with interrupted #1 Vicryl suture. We reapproximated patient's subcutaneous tissue with interrupted 2-0 Vicryl suture. The reapproximated patient's skin with Steri-Strips and benzoin. The wound was then coated with bacitracin ointment. A sterile dressing was applied. The drapes were removed. The patient was subsequently returned to the supine position where they were extubated by the anesthesia team. He was then transported to the post anesthesia care unit in stable condition. All sponge instrument and needle counts were reportedly correct at the end of  this case.

## 2016-04-08 NOTE — Anesthesia Preprocedure Evaluation (Addendum)
Anesthesia Evaluation  Patient identified by MRN, date of birth, ID band Patient awake    Reviewed: Allergy & Precautions, NPO status , Patient's Chart, lab work & pertinent test results  Airway Mallampati: II  TM Distance: >3 FB Neck ROM: Full    Dental  (+) Poor Dentition,    Pulmonary neg pulmonary ROS, former smoker,    Pulmonary exam normal        Cardiovascular negative cardio ROS Normal cardiovascular exam     Neuro/Psych negative neurological ROS     GI/Hepatic Neg liver ROS, GERD  Medicated,  Endo/Other  Hypothyroidism obesity  Renal/GU negative Renal ROS     Musculoskeletal negative musculoskeletal ROS (+) Arthritis , Osteoarthritis,    Abdominal   Peds  Hematology negative hematology ROS (+)   Anesthesia Other Findings Hx of metastatic melanoma s/p chemo. No recurrence.   Reproductive/Obstetrics                            Anesthesia Physical Anesthesia Plan  ASA: II  Anesthesia Plan: General   Post-op Pain Management:    Induction: Intravenous  Airway Management Planned: Oral ETT  Additional Equipment:   Intra-op Plan:   Post-operative Plan: Extubation in OR  Informed Consent: I have reviewed the patients History and Physical, chart, labs and discussed the procedure including the risks, benefits and alternatives for the proposed anesthesia with the patient or authorized representative who has indicated his/her understanding and acceptance.   Dental advisory given  Plan Discussed with: CRNA  Anesthesia Plan Comments:         Anesthesia Quick Evaluation

## 2016-04-08 NOTE — H&P (Signed)
Subjective: The patient is a 70 year old white female who has complained of back and leg pain consistent with neurogenic claudication. She has failed medical management and was worked up with a lumbar MRI which demonstrated an L4-5 spondylosis and spinal stenosis. I discussed the situation with the patient. We discussed the various treatment options. She has decided to proceed with an L4-5 decompression, instrumentation, and fusion after weighing the risks, benefits, and alternatives to surgery.   Past Medical History:  Diagnosis Date  . Adenomatous colon polyp   . Arthritis   . Childhood asthma   . Chronic heartburn    On omeprazole  . External hemorrhoids   . GERD (gastroesophageal reflux disease)   . Heme positive stool   . Hyperlipidemia   . Hypothyroidism   . Iron deficiency anemia   . Metastatic melanoma ([facility])    Followed by Dr Oliva Bustard, previous chemo, no recurrence, 2008?    Past Surgical History:  Procedure Laterality Date  . ABDOMINAL HYSTERECTOMY  1984  . CHOLECYSTECTOMY  1990  . COLONOSCOPY  08/2006   Four sessile polyps found and removed in sigmoid colon at splenic flexure and ascending colon. 7 mm in size. Another removed from transverse colon 4 mm. Path Report - showed tubular adenoma and hyperplastic polyp. Advised to repeat in 3.5 years (02/2010)  . COLONOSCOPY  3.2.2011   8 mm polyp in sigmoid colon, removed, 4 mm polyp in descending colon, removed. Internal hemorrhoids  . ESOPHAGOGASTRODUODENOSCOPY  3.2.2011   Large hiatia hernia, esophagus normal, mulitple small sessile polyps w/no stigmata of recent bleeding found. Mildy erythermatous mucosa w/no bleeding found in gatric antrum. Normal duodenum. 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 PATH - no active inflammation, antral mucosa w/mild foveolar hyperplasia  . Lymph Node Removal  12/2005   Left inguinal lymph node dissection  . MELANOMA EXCISION  1993   thigh  . ORIF ANKLE FRACTURE Right    Dr.  Mack Guise    Allergies  Allergen Reactions  . Sulfa Antibiotics Rash    Social History  Substance Use Topics  . Smoking status: Former Smoker    Packs/day: 0.50    Years: 8.00    Types: Cigarettes    Quit date: 07/12/1978  . Smokeless tobacco: Never Used  . Alcohol use No    Family History  Problem Relation Age of Onset  . Aneurysm Mother   . Heart disease Mother   . Colon polyps Father   . Diabetes Father   . Leukemia Maternal Grandfather   . Skin cancer Paternal Grandfather   . Diabetes Other   . Colon polyps Other   . Colon cancer Neg Hx   . Breast cancer Neg Hx    Prior to Admission medications   Medication Sig Start Date End Date Taking? Authorizing Provider  Acetaminophen (TYLENOL ARTHRITIS PAIN PO) Take 650 mg by mouth daily as needed. Arthritis pain   Yes Historical Provider, MD  aspirin 81 MG chewable tablet Chew 81 mg by mouth daily.   Yes Historical Provider, MD  b complex vitamins tablet Take 1 tablet by mouth daily.   Yes Historical Provider, MD  Calcium-Magnesium-Vitamin D (CALCIUM 1200+D3 PO) Take 1 tablet by mouth daily.   Yes Historical Provider, MD  citalopram (CELEXA) 20 MG tablet Take 1 tablet (20 mg total) by mouth daily. 03/17/16  Yes [name], DO  ferrous sulfate 325 (65 FE) MG tablet Take 325 mg by mouth daily with breakfast.   Yes Historical Provider,  MD  Glucosamine-Chondroit-Vit C-Mn (GLUCOSAMINE 1500 COMPLEX PO) Take 1 capsule by mouth daily.   Yes Historical Provider, MD  ibuprofen (ADVIL,MOTRIN) 800 MG tablet TAKE 1 TABLET (800 MG TOTAL) BY MOUTH EVERY 8 (EIGHT) HOURS AS NEEDED. Patient taking differently: Take 800 mg by mouth every 8 (eight) hours as needed for moderate pain. TAKE 1 TABLET (800 MG TOTAL) BY MOUTH EVERY 8 (EIGHT) HOURS AS NEEDED. 05/14/15  Yes Jackolyn Confer, MD  levothyroxine (SYNTHROID, LEVOTHROID) 125 MCG tablet Take 1 tablet (125 mcg total) by mouth daily before breakfast. 01/26/16  Yes Jackolyn Confer, MD  Multiple  Vitamins-Minerals (EYE VITAMINS & MINERALS) TABS Take 2 tablets by mouth daily.    Yes Historical Provider, MD  omeprazole (PRILOSEC) 20 MG capsule Take 1 capsule (20 mg total) by mouth daily. 01/26/16  Yes Jackolyn Confer, MD  pravastatin (PRAVACHOL) 40 MG tablet Take 0.5 tablets (20 mg total) by mouth daily. 01/26/16  Yes Jackolyn Confer, MD  Probiotic Product (PROBIOTIC DAILY PO) Take 1 tablet by mouth daily.    Yes Historical Provider, MD  TURMERIC PO Take 1 tablet by mouth daily.   Yes Historical Provider, MD     Review of Systems  Positive ROS: As above  All other systems have been reviewed and were otherwise negative with the exception of those mentioned in the HPI and as above.  Objective: Vital signs in last 24 hours: Temp:  [98.7 F (37.1 C)] 98.7 F (37.1 C) (09/28 0947) Pulse Rate:  [83] 83 (09/28 0947) Resp:  [20] 20 (09/28 0947) BP: (127)/(45) 127/45 (09/28 0947) SpO2:  [94 %] 94 % (09/28 0947) Weight:  [96.6 kg (213 lb)] 96.6 kg (213 lb) (09/28 1009)  General Appearance: Alert, cooperative, no distress, Head: Normocephalic, without obvious abnormality, atraumatic Eyes: PERRL, conjunctiva/corneas clear, EOM's intact,    Ears: Normal  Throat: Normal  Neck: Supple, symmetrical, trachea midline, no adenopathy; thyroid: No enlargement/tenderness/nodules; no carotid bruit or JVD Back: Symmetric, no curvature, ROM normal, no CVA tenderness Lungs: Clear to auscultation bilaterally, respirations unlabored Heart: Regular rate and rhythm, no murmur, rub or gallop Abdomen: Soft, non-tender,, no masses, no organomegaly Extremities: Extremities normal, atraumatic, no cyanosis or edema Pulses: 2+ and symmetric all extremities Skin: Skin color, texture, turgor normal, no rashes or lesions  NEUROLOGIC:   Mental status: alert and oriented, no aphasia, good attention span, Fund of knowledge/ memory ok Motor Exam - grossly normal Sensory Exam - grossly normal Reflexes:   Coordination - grossly normal Gait - grossly normal Balance - grossly normal Cranial Nerves: I: smell Not tested  II: visual acuity  OS: Normal  OD: Normal   II: visual fields Full to confrontation  II: pupils Equal, round, reactive to light  III,VII: ptosis None  III,IV,VI: extraocular muscles  Full ROM  V: mastication Normal  V: facial light touch sensation  Normal  V,VII: corneal reflex  Present  VII: facial muscle function - upper  Normal  VII: facial muscle function - lower Normal  VIII: hearing Not tested  IX: soft palate elevation  Normal  IX,X: gag reflex Present  XI: trapezius strength  5/5  XI: sternocleidomastoid strength 5/5  XI: neck flexion strength  5/5  XII: tongue strength  Normal    Data Review Lab Results  Component Value Date   WBC 6.7 03/26/2016   HGB 10.8 (L) 03/26/2016   HCT 35.7 (L) 03/26/2016   MCV 86.4 03/26/2016   PLT 134 (L) 03/26/2016  Lab Results  Component Value Date   NA 140 03/26/2016   K 4.9 03/26/2016   CL 105 03/26/2016   CO2 25 03/26/2016   BUN 11 03/26/2016   CREATININE 0.95 03/26/2016   GLUCOSE 91 03/26/2016   Lab Results  Component Value Date   INR 1.0 07/10/2013    Assessment/Plan: L4-5 spondylolisthesis, spinal stenosis, facet arthropathy, lumbago, lumbar radiculopathy, neurogenic claudication: I have discussed the situation with the patient and reviewed her imaging studies with her. We have discussed the various treatment options including surgery. I have described the surgical treatment option of an L4-5 decompression, instrumentation, and fusion. I have shown her surgical models. We have discussed the risks, benefits, alternatives, expected postoperative course, and likelihood of achieving her goals for surgery. I have answered all her questions. She has decided to proceed with surgery.   Christiann Hagerty D 04/08/2016 11:15 AM

## 2016-04-08 NOTE — Progress Notes (Signed)
Orthopedic Tech Progress Note Patient Details:  Janet Rice September 18, 1945 [id number] Patient already has brace. Patient ID: Janet Rice, female   DOB: [birth year], 70 y.o.   MRN: [id number]   Braulio Bosch 04/08/2016, 6:39 PM

## 2016-04-08 NOTE — Transfer of Care (Signed)
Immediate Anesthesia Transfer of Care Note  Patient: Janet Rice  Procedure(s) Performed: Procedure(s): POSTERIOR LUMBAR FUSION, INTERBODY PROSTHESIS, POSTERIOR NON-SEGMENTAL INSTRUMENTATION, POSTERIOR LATERAL ARTHRODESIS LUMBAR FOUR- LUMBAR FIVE (N/A)  Patient Location: PACU  Anesthesia Type:General  Level of Consciousness: sedated and patient cooperative  Airway & Oxygen Therapy: Patient Spontanous Breathing and Patient connected to nasal cannula oxygen  Post-op Assessment: Report given to RN, Post -op Vital signs reviewed and stable and Patient moving all extremities X 4  Post vital signs: Reviewed and stable  Last Vitals:  Vitals:   04/08/16 0947 04/08/16 1530  BP: (!) 127/45   Pulse: 83   Resp: 20   Temp: 37.1 C (P) 36.7 C    Last Pain:  Vitals:   04/08/16 1006  TempSrc:   PainSc: 6          Complications: No apparent anesthesia complications

## 2016-04-08 NOTE — Progress Notes (Signed)
Subjective:  The patient is somnolent but easily arousable. She is in no apparent distress. She looks well.  Objective: Vital signs in last 24 hours: Temp:  [98.1 F (36.7 C)-98.7 F (37.1 C)] 98.1 F (36.7 C) (09/28 1530) Pulse Rate:  [83] 83 (09/28 0947) Resp:  [20] 20 (09/28 0947) BP: (127)/(45) 127/45 (09/28 0947) SpO2:  [94 %] 94 % (09/28 0947) Weight:  [96.6 kg (213 lb)] 96.6 kg (213 lb) (09/28 1009)  Intake/Output from previous day: No intake/output data recorded. Intake/Output this shift: Total I/O In: 1000 [I.V.:1000] Out: 355 [Urine:205; Blood:150]  Physical exam the patient is somewhat but easily arousable. She is moving her lower extremities well.  Lab Results: No results for input(s): WBC, HGB, HCT, PLT in the last 72 hours. BMET No results for input(s): NA, K, CL, CO2, GLUCOSE, BUN, CREATININE, CALCIUM in the last 72 hours.  Studies/Results: Dg Lumbar Spine 1 View  Result Date: 04/08/2016 CLINICAL DATA:  L4-5 PLIF. EXAM: LUMBAR SPINE - 1 VIEW COMPARISON:  MRI lumbar spine 02/09/2016 FINDINGS: A single intraoperative view lumbar spine demonstrates a surgical probe at the L4-5 level. IMPRESSION: Intraoperative localization of the L4-5 level. Electronically Signed   By: San Morelle M.D.   On: 04/08/2016 15:30    Assessment/Plan: The patient is doing well.  LOS: 0 days     Trevious Rampey D 04/08/2016, 3:41 PM

## 2016-04-09 LAB — CBC
HCT: 31.6 % — ABNORMAL LOW (ref 36.0–46.0)
Hemoglobin: 9.2 g/dL — ABNORMAL LOW (ref 12.0–15.0)
MCH: 25.8 pg — ABNORMAL LOW (ref 26.0–34.0)
MCHC: 29.1 g/dL — ABNORMAL LOW (ref 30.0–36.0)
MCV: 88.8 fL (ref 78.0–100.0)
Platelets: 172 10*3/uL (ref 150–400)
RBC: 3.56 MIL/uL — ABNORMAL LOW (ref 3.87–5.11)
RDW: 18.3 % — ABNORMAL HIGH (ref 11.5–15.5)
WBC: 11.9 10*3/uL — ABNORMAL HIGH (ref 4.0–10.5)

## 2016-04-09 LAB — BASIC METABOLIC PANEL
Anion gap: 7 (ref 5–15)
BUN: 9 mg/dL (ref 6–20)
CO2: 29 mmol/L (ref 22–32)
Calcium: 8.7 mg/dL — ABNORMAL LOW (ref 8.9–10.3)
Chloride: 101 mmol/L (ref 101–111)
Creatinine, Ser: 1.01 mg/dL — ABNORMAL HIGH (ref 0.44–1.00)
GFR calc Af Amer: >60 mL/min (ref >60)
GFR calc non Af Amer: 55 mL/min — ABNORMAL LOW (ref >60)
Glucose, Bld: 145 mg/dL — ABNORMAL HIGH (ref 65–99)
Potassium: 4 mmol/L (ref 3.5–5.1)
Sodium: 137 mmol/L (ref 135–145)

## 2016-04-09 MED ORDER — ONDANSETRON HCL 4 MG PO TABS
4.0000 mg | ORAL_TABLET | ORAL | Status: DC | PRN
  Administered 2016-04-09: 4 mg via ORAL
  Filled 2016-04-09: qty 1

## 2016-04-09 MED FILL — Sodium Chloride IV Soln 0.9%: INTRAVENOUS | Qty: 1000 | Status: AC

## 2016-04-09 MED FILL — Heparin Sodium (Porcine) Inj 1000 Unit/ML: INTRAMUSCULAR | Qty: 30 | Status: AC

## 2016-04-09 NOTE — Evaluation (Signed)
Physical Therapy Evaluation Patient Details Name: Janet Rice MRN: [id number] DOB: September 26, 1945 Today's Date: 04/09/2016   History of Present Illness  Patient is a 70 y/o female with hx of metastatic melonoma and HLD presents s/p L4-5 PLIF.   Clinical Impression  Patient presents with pain and post surgical deficits s/p above surgery impacting mobility. Requires Min A for bed mobility and Min guard for ambulation. Balance and stability improved with use of RW. Education re: back precautions, brace, positioning etc. Pt has support from spouse at home. Will follow acutely to maximize independence and mobility prior to return home. Plan for stair training tomorrow prior to d/c.    Follow Up Recommendations Home health PT;Supervision - Intermittent    Equipment Recommendations  None recommended by PT    Recommendations for Other Services       Precautions / Restrictions Precautions Precautions: Fall;Back Precaution Booklet Issued: Yes (comment) Precaution Comments: Reviewed back precautions and handout Required Braces or Orthoses: Spinal Brace Spinal Brace: Lumbar corset Restrictions Weight Bearing Restrictions: No      Mobility  Bed Mobility Overal bed mobility: Needs Assistance Bed Mobility: Rolling;Sidelying to Sit Rolling: Min assist Sidelying to sit: Min assist       General bed mobility comments: HOB flat, no use of rails to simulate home. Cues for log roll technique. Assist to roll and step by step cues for technique.  Transfers Overall transfer level: Needs assistance Equipment used: None Transfers: Sit to/from Stand Sit to Stand: Min guard         General transfer comment: Min guard for safety. Stood from [employer], from [employer]. Transferred to chair post ambulation bout.  Ambulation/Gait Ambulation/Gait assistance: Min guard Ambulation Distance (Feet): 150 Feet Assistive device: Rolling walker (2 wheeled) Gait Pattern/deviations: Step-through  pattern;Decreased stride length;Trunk flexed Gait velocity: decreased   General Gait Details: Slow, more steady gait using RW for support,. cues for upright and to adhere to back precautions during mobility. 2 standing rest breaks.  Stairs            Wheelchair Mobility    Modified Rankin (Stroke Patients Only)       Balance Overall balance assessment: Needs assistance Sitting-balance support: Feet supported;No upper extremity supported Sitting balance-Leahy Scale: Fair     Standing balance support: During functional activity Standing balance-Leahy Scale: Fair Standing balance comment: Able to wash hands at sink without UE support.                             Pertinent Vitals/Pain Pain Assessment: Faces Faces Pain Scale: Hurts little more Pain Location: back  Pain Descriptors / Indicators: Sore;Operative site guarding Pain Intervention(s): Monitored during session;Repositioned    Home Living Family/patient expects to be discharged to:: Private residence Living Arrangements: Spouse/significant other Available Help at Discharge: Family;Available 24 hours/day Type of Home: House Home Access: Stairs to enter Entrance Stairs-Rails: None Entrance Stairs-Number of Steps: 2 (can grab the door) Home Layout: One level Home Equipment: [employer] - 2 wheels;Cane - single point      Prior Function Level of Independence: Independent         Comments: Furniture walker PTA. Drives.      Hand Dominance        Extremity/Trunk Assessment   Upper Extremity Assessment: Defer to OT evaluation           Lower Extremity Assessment: Generalized weakness      Cervical / Trunk Assessment:  Other exceptions  Communication   Communication: No difficulties  Cognition Arousal/Alertness: Awake/alert Behavior During Therapy: WFL for tasks assessed/performed Overall Cognitive Status: Within Functional Limits for tasks assessed                       General Comments      Exercises     Assessment/Plan    PT Assessment Patient needs continued PT services  PT Problem List Decreased strength;Decreased mobility;Decreased balance;Decreased knowledge of precautions;Decreased activity tolerance;Pain          PT Treatment Interventions DME instruction;Therapeutic activities;Gait training;Therapeutic exercise;Patient/family education;Balance training;Functional mobility training;Stair training    PT Goals (Current goals can be found in the Care Plan section)  Acute Rehab PT Goals Patient Stated Goal: to go home PT Goal Formulation: With patient Time For Goal Achievement: 04/23/16 Potential to Achieve Goals: Good    Frequency Min 5X/week   Barriers to discharge Inaccessible home environment stairs to enter home no rail.    Co-evaluation               End of Session Equipment Utilized During Treatment: Gait belt;Back brace Activity Tolerance: Patient tolerated treatment well Patient left: in chair;with call bell/phone within reach Nurse Communication: Mobility status         Time: [id number] PT Time Calculation (min) (ACUTE ONLY): 21 min   Charges:   PT Evaluation $PT Eval Moderate Complexity: 1 Procedure     PT G Codes:        Pearson Picou A Layton Tappan 04/09/2016, 8:03 AM Wray Kearns, PT, DPT [phone]

## 2016-04-09 NOTE — Progress Notes (Signed)
Patient ID: Janet Rice, female   DOB: 1946-06-09, 70 y.o.   MRN: [id number] Subjective:  The patient is alert and pleasant. She looks well. She is in no apparent distress. She wants to go home tomorrow.  Objective: Vital signs in last 24 hours: Temp:  [97.6 F (36.4 C)-99.1 F (37.3 C)] 99.1 F (37.3 C) (09/29 0752) Pulse Rate:  [71-88] 88 (09/29 0752) Resp:  [18-25] 18 (09/29 0752) BP: (106-134)/(45-96) 108/57 (09/29 0752) SpO2:  [94 %-98 %] 97 % (09/29 0752) Weight:  [96.6 kg (213 lb)] 96.6 kg (213 lb) (09/28 1009)  Intake/Output from previous day: 09/28 0701 - 09/29 0700 In: 1000 [I.V.:1000] Out: 1315 [Urine:1165; Blood:150] Intake/Output this shift: No intake/output data recorded.  Physical exam the patient is alert and oriented. She is moving her lower extremities well.  Lab Results:  Recent Labs  04/09/16 0335  WBC 11.9*  HGB 9.2*  HCT 31.6*  PLT 172   BMET  Recent Labs  04/09/16 0335  NA 137  K 4.0  CL 101  CO2 29  GLUCOSE 145*  BUN 9  CREATININE 1.01*  CALCIUM 8.7*    Studies/Results: Dg Lumbar Spine 2-3 Views  Result Date: 04/08/2016 CLINICAL DATA:  70 year old female undergoing L4-L5 posterior lumbar interbody fusion EXAM: DG C-ARM 61-120 MIN; LUMBAR SPINE - 2-3 VIEW COMPARISON:  Additional intraoperative radiographs obtained earlier today FINDINGS: 2 intraoperative spot radiographs demonstrate placement of bilateral L4 and L5 pedicle screws and an interbody graft in the L4-L5 interspace. No evidence of immediate complication. IMPRESSION: Intraoperative views obtained during L4-L5 posterior lumbar interbody fusion as above. Electronically Signed   By: Jacqulynn Cadet M.D.   On: 04/08/2016 16:40   Dg Lumbar Spine 1 View  Result Date: 04/08/2016 CLINICAL DATA:  L4-5 PLIF. EXAM: LUMBAR SPINE - 1 VIEW COMPARISON:  MRI lumbar spine 02/09/2016 FINDINGS: A single intraoperative view lumbar spine demonstrates a surgical probe at the L4-5 level.  IMPRESSION: Intraoperative localization of the L4-5 level. Electronically Signed   By: San Morelle M.D.   On: 04/08/2016 15:30   Dg C-arm 1-60 Min  Result Date: 04/08/2016 CLINICAL DATA:  70 year old female undergoing L4-L5 posterior lumbar interbody fusion EXAM: DG C-ARM 61-120 MIN; LUMBAR SPINE - 2-3 VIEW COMPARISON:  Additional intraoperative radiographs obtained earlier today FINDINGS: 2 intraoperative spot radiographs demonstrate placement of bilateral L4 and L5 pedicle screws and an interbody graft in the L4-L5 interspace. No evidence of immediate complication. IMPRESSION: Intraoperative views obtained during L4-L5 posterior lumbar interbody fusion as above. Electronically Signed   By: Jacqulynn Cadet M.D.   On: 04/08/2016 16:40    Assessment/Plan: Postop day #1: The patient is doing well. We will mobilize her with PT. She will likely go home tomorrow.  LOS: 1 day     Rick Warnick D 04/09/2016, 9:22 AM

## 2016-04-10 MED ORDER — OXYCODONE-ACETAMINOPHEN 5-325 MG PO TABS: 1.0000 | ORAL_TABLET | ORAL | 0 refills | Status: DC | PRN

## 2016-04-10 MED ORDER — DOCUSATE SODIUM 100 MG PO CAPS: 100.0000 mg | ORAL_CAPSULE | Freq: Two times a day (BID) | ORAL | 0 refills | Status: DC

## 2016-04-10 NOTE — Progress Notes (Signed)
Patient alert and oriented, mae's well, voiding adequate amount of urine, swallowing without difficulty, no c/o pain. Patient discharged home with family. Script and discharged instructions given to patient. Patient and family stated understanding of d/c instructions given and has an appointment with MD. 

## 2016-04-10 NOTE — Progress Notes (Signed)
Physical Therapy Treatment Patient Details Name: Janet Rice MRN: [id number] DOB: 12-06-1945 Today's Date: 04/10/2016    History of Present Illness Patient is a 70 y/o female with hx of metastatic melonoma and HLD presents s/p L4-5 PLIF.     PT Comments    Patient progressing well towards PT goals. Reports new pain in bil calves today. Improved ambulation distance and tolerated stair training with Min A for balance/safety. Pt able to recall 3/3 back precautions but needs cues to adhere to them during mobility. Plans to d/c home with spouse today. Will follow if still in hospital tomorrow.   Follow Up Recommendations  Home health PT;Supervision - Intermittent     Equipment Recommendations  None recommended by PT    Recommendations for Other Services       Precautions / Restrictions Precautions Precautions: Fall;Back Precaution Booklet Issued: Yes (comment) Precaution Comments: Reviewed back precautions. Pt able to recall 3/3. Required Braces or Orthoses: Spinal Brace Spinal Brace: Lumbar corset Restrictions Weight Bearing Restrictions: No    Mobility  Bed Mobility   Bed Mobility: Sit to Sidelying;Rolling Rolling: Supervision       Sit to sidelying: Supervision General bed mobility comments: HOB flat, no use of rails to simulate home. Cues for log roll technique.  Transfers Overall transfer level: Needs assistance Equipment used: Rolling walker (2 wheeled) Transfers: Sit to/from Stand Sit to Stand: Min guard         General transfer comment: Min guard for safety.   Ambulation/Gait Ambulation/Gait assistance: Min guard Ambulation Distance (Feet): 250 Feet Assistive device: Rolling walker (2 wheeled) Gait Pattern/deviations: Step-through pattern;Decreased stride length;Trunk flexed Gait velocity: decreased   General Gait Details: Slow, steady gait with multiple standing rest breaks due to SOB and pain. Cues for upright posture. Needs cues to adhere to  precautions during mobility.   Stairs Stairs: Yes Stairs assistance: Min assist Stair Management: One rail Left Number of Stairs: 2 General stair comments: Cues for technique and safety.   Wheelchair Mobility    Modified Rankin (Stroke Patients Only)       Balance Overall balance assessment: Needs assistance Sitting-balance support: Feet supported;No upper extremity supported Sitting balance-Leahy Scale: Fair     Standing balance support: During functional activity Standing balance-Leahy Scale: Fair                      Cognition Arousal/Alertness: Awake/alert Behavior During Therapy: WFL for tasks assessed/performed Overall Cognitive Status: Within Functional Limits for tasks assessed                      Exercises      General Comments General comments (skin integrity, edema, etc.): Spouse present during session.      Pertinent Vitals/Pain Pain Assessment: Faces Faces Pain Scale: Hurts even more Pain Location: calves and back Pain Descriptors / Indicators: Sore;Aching;Operative site guarding Pain Intervention(s): Monitored during session;Repositioned;Premedicated before session    Home Living                      Prior Function            PT Goals (current goals can now be found in the care plan section) Progress towards PT goals: Progressing toward goals    Frequency    Min 5X/week      PT Plan Current plan remains appropriate    Co-evaluation             End  of Session Equipment Utilized During Treatment: Gait belt;Back brace Activity Tolerance: Patient tolerated treatment well;Patient limited by pain Patient left: in bed;with call bell/phone within reach;with family/visitor present     Time: [id number] PT Time Calculation (min) (ACUTE ONLY): 17 min  Charges:  $Gait Training: 8-22 mins                    G Codes:      Rhett Mutschler A Victorya Hillman 04/10/2016, 10:02 AM Wray Kearns, [place], DPT [phone]

## 2016-04-10 NOTE — Discharge Summary (Signed)
Physician Discharge Summary  Patient ID: Janet Rice MRN: [id number] DOB/AGE: [birth year] 70 y.o.  Admit date: 04/08/2016 Discharge date: 04/10/2016  Admission Diagnoses:Lumbar scoliosis, lumbar spondylolisthesis, lumbar spinal stenosis, lumbago, lumbar radiculopathy, neurogenic claudication  Discharge Diagnoses: The same Active Problems:   Spondylolisthesis of lumbar region   Discharged Condition: good  Hospital Course: I performed an L4-5 decompression, instrumentation, and fusion on the patient on 04/08/2016. The surgery went well.  The patient's postoperative course was unremarkable. On postoperative day #2 she requested discharge home. The patient, and her husband, were given written and oral discharge instructions. All their questions were answered.  Consults: Physical therapy Significant Diagnostic Studies: None Treatments: L4-5 decompression, instrumentation, and fusion. Discharge Exam: Blood pressure (!) 98/51, pulse 82, temperature 98.1 F (36.7 C), resp. rate 18, height 5\' 2"  (1.575 m), weight 96.6 kg (213 lb), SpO2 99 %. The patient is alert and pleasant. She looks well. Her strength is grossly normal in her lower extremities.  Disposition: Home  Discharge Instructions    Call MD for:  difficulty breathing, headache or visual disturbances    Complete by:  As directed    Call MD for:  extreme fatigue    Complete by:  As directed    Call MD for:  hives    Complete by:  As directed    Call MD for:  persistant dizziness or light-headedness    Complete by:  As directed    Call MD for:  persistant nausea and vomiting    Complete by:  As directed    Call MD for:  redness, tenderness, or signs of infection (pain, swelling, redness, odor or green/yellow discharge around incision site)    Complete by:  As directed    Call MD for:  severe uncontrolled pain    Complete by:  As directed    Call MD for:  temperature >100.4    Complete by:  As directed    Diet - low  sodium heart healthy    Complete by:  As directed    Discharge instructions    Complete by:  As directed    Call [phone] for a followup appointment. Take a stool softener while you are using pain medications.   Driving Restrictions    Complete by:  As directed    Do not drive for 2 weeks.   Increase activity slowly    Complete by:  As directed    Lifting restrictions    Complete by:  As directed    Do not lift more than 5 pounds. No excessive bending or twisting.   May shower / Bathe    Complete by:  As directed    He may shower after the pain she is removed 3 days after surgery. Leave the incision alone.   Remove dressing in 24 hours    Complete by:  As directed        Medication List    STOP taking these medications   ibuprofen 800 MG tablet Commonly known as:  ADVIL,MOTRIN   TYLENOL ARTHRITIS PAIN PO     TAKE these medications   aspirin 81 MG chewable tablet Chew 81 mg by mouth daily.   b complex vitamins tablet Take 1 tablet by mouth daily.   CALCIUM 1200+D3 PO Take 1 tablet by mouth daily.   citalopram 20 MG tablet Commonly known as:  CELEXA Take 1 tablet (20 mg total) by mouth daily.   docusate sodium 100 MG capsule Commonly known as:  COLACE  Take 1 capsule (100 mg total) by mouth 2 (two) times daily.   EYE VITAMINS & MINERALS Tabs Take 2 tablets by mouth daily.   ferrous sulfate 325 (65 FE) MG tablet Take 325 mg by mouth daily with breakfast.   GLUCOSAMINE 1500 COMPLEX PO Take 1 capsule by mouth daily.   levothyroxine 125 MCG tablet Commonly known as:  SYNTHROID, LEVOTHROID Take 1 tablet (125 mcg total) by mouth daily before breakfast.   omeprazole 20 MG capsule Commonly known as:  PRILOSEC Take 1 capsule (20 mg total) by mouth daily.   oxyCODONE-acetaminophen 5-325 MG tablet Commonly known as:  PERCOCET/ROXICET Take 1-2 tablets by mouth every 4 (four) hours as needed for moderate pain.   pravastatin 40 MG tablet Commonly known as:   PRAVACHOL Take 0.5 tablets (20 mg total) by mouth daily.   PROBIOTIC DAILY PO Take 1 tablet by mouth daily.   TURMERIC PO Take 1 tablet by mouth daily.        SignedNewman Pies D 04/10/2016, 10:06 AM

## 2016-04-19 ENCOUNTER — Encounter (HOSPITAL_COMMUNITY): Payer: Self-pay | Admitting: Neurosurgery

## 2016-04-26 DIAGNOSIS — Z6839 Body mass index (BMI) 39.0-39.9, adult: Secondary | ICD-10-CM | POA: Diagnosis not present

## 2016-04-26 DIAGNOSIS — M4316 Spondylolisthesis, lumbar region: Secondary | ICD-10-CM | POA: Diagnosis not present

## 2016-04-29 ENCOUNTER — Ambulatory Visit: Payer: Medicare Other | Admitting: Oncology

## 2016-05-24 ENCOUNTER — Other Ambulatory Visit: Payer: Self-pay | Admitting: Family Medicine

## 2016-05-24 DIAGNOSIS — Z1231 Encounter for screening mammogram for malignant neoplasm of breast: Secondary | ICD-10-CM

## 2016-05-25 ENCOUNTER — Ambulatory Visit: Payer: Medicare Other | Attending: Neurosurgery | Admitting: Physical Therapy

## 2016-05-25 ENCOUNTER — Encounter: Payer: Self-pay | Admitting: Physical Therapy

## 2016-05-25 DIAGNOSIS — R262 Difficulty in walking, not elsewhere classified: Secondary | ICD-10-CM | POA: Insufficient documentation

## 2016-05-25 DIAGNOSIS — M25551 Pain in right hip: Secondary | ICD-10-CM | POA: Insufficient documentation

## 2016-05-25 DIAGNOSIS — M545 Low back pain, unspecified: Secondary | ICD-10-CM

## 2016-05-25 DIAGNOSIS — M6281 Muscle weakness (generalized): Secondary | ICD-10-CM

## 2016-05-25 DIAGNOSIS — G8929 Other chronic pain: Secondary | ICD-10-CM

## 2016-05-25 NOTE — Therapy (Signed)
[facility] [address] [place], [place], [address] Phone: [phone]   Fax:  [phone]  Physical Therapy Evaluation  Patient Details  Name: Janet Rice MRN: [id number] Date of Birth: 70-Sep-1947 Referring Provider: Ophelia Charter MD  Encounter Date: 05/25/2016      PT End of Session - 05/25/16 1238    Visit Number 1   Number of Visits 9   Date for PT Re-Evaluation 06/24/16   PT Start Time 0940   PT Stop Time 1030   PT Time Calculation (min) 50 min   Activity Tolerance Patient tolerated treatment well;Patient limited by pain   Behavior During Therapy [facility] for tasks assessed/performed      Past Medical History:  Diagnosis Date  . Adenomatous colon polyp   . Arthritis   . Childhood asthma   . Chronic heartburn    On omeprazole  . External hemorrhoids   . GERD (gastroesophageal reflux disease)   . Heme positive stool   . Hyperlipidemia   . Hypothyroidism   . Iron deficiency anemia   . Metastatic melanoma ([place])    Followed by Dr Oliva Bustard, previous chemo, no recurrence, 2008?    Past Surgical History:  Procedure Laterality Date  . ABDOMINAL HYSTERECTOMY  1984  . CHOLECYSTECTOMY  1990  . COLONOSCOPY  08/2006   Four sessile polyps found and removed in sigmoid colon at splenic flexure and ascending colon. 7 mm in size. Another removed from transverse colon 4 mm. Path Report - showed tubular adenoma and hyperplastic polyp. Advised to repeat in 3.5 years (02/2010)  . COLONOSCOPY  3.2.2011   8 mm polyp in sigmoid colon, removed, 4 mm polyp in descending colon, removed. Internal hemorrhoids  . ESOPHAGOGASTRODUODENOSCOPY  3.2.2011   Large hiatia hernia, esophagus normal, mulitple small sessile polyps w/no stigmata of recent bleeding found. Mildy erythermatous mucosa w/no bleeding found in gatric antrum. Normal duodenum. 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 PATH - no active inflammation, antral mucosa  w/mild foveolar hyperplasia  . Lymph Node Removal  12/2005   Left inguinal lymph node dissection  . MELANOMA EXCISION  1993   thigh  . ORIF ANKLE FRACTURE Right    Dr. Mack Guise    There were no vitals filed for this visit.       Subjective Assessment - 05/25/16 1227    Subjective Pt is currently recovery from back surgery on 9/28. Pt states she prior to surgery she was in moderate-severe pain at all times. Reports that since surgery her pain level is typically mild and she can alleviate all pain symptoms if she takes ibuprofen. Pt used her rolling Janet and back brace following surgery. She quickly progressed to walking with a single point cane. States that she continues to use the cane primarily due to fear of leg buckling. She has been largely inactive for several years due to history of severe back pain and feels that her legs and back has gotten weak from long period of disuse. Patient currently works a part time job (<4 hours per day) at home in front of the computer.  Her normal daily activity includes housework, yardwork and gardening. She states that her husband assists with house work and they have hired a Chief Operating Officer for the time being. Pt would like to progress towards an increase in activity with goals to walk independently without SPC and walk >1 mile per day. Pt states that she used to enjoy horseback riding and  if possible she would like to get back to low intensity/trail riding in the future.   Pertinent History s/p L4-L5 fusion 9/28.    Limitations Sitting;Lifting;Standing;Walking;House hold activities   How long can you sit comfortably? <1 hr before increase in low back pain   How long can you stand comfortably? >30 minutes; prefers UE support on cane or shopping cart   How long can you walk comfortably? >30 minutes if UE support on cane or shopping cart; pt currently limiting activity level   Patient Stated Goals pt would like to be able to walk independently and get back  into a regular exercise program   Currently in Pain? Yes   Pain Score 4    Pain Location Hip   Pain Orientation Right;Anterior   Pain Onset More than a month ago   Pain Frequency Constant   Aggravating Factors  hip flexion, knee flexion            [facility] PT Assessment - 05/25/16 0001      Assessment   Medical Diagnosis spondylolisthesis, lumbar region   Referring Provider Ophelia Charter MD   Onset Date/Surgical Date 04/08/16   Hand Dominance Right   Next MD Visit Jan 2018   Prior Therapy 2 visits PT while in hospital      Objective:  Therapeutic Exercise: Standing hip flexor stretch 30 sec R/L LE. Hip flexor stretch with plinth 30 sec R/L LE. Seated piriformis stretch LLE 30 sec x1; pt unable to tolerate RLE piriformis stretch secondary to onset of anterior/medial R hip pain. Standing quadriceps stretch with towel 30 sec R/L LE. Hooklying bridge x10. SLR R/L x10 - pt performs modified SLR on RLE; able to tolerate <30 deg hip flexion on RLE before onset of moderate hip pain.  Pt response for medical necessity: Pt demonstrates deficits in lower extremity strength and lumbar mobility. She is primarily limited by onset of R hip pain with functional activities. Pt with onset of R hip pain especially with lumbar extension, long lever active hip flexion, and supine piriformis stretch. Pt will benefit from progressive LE strengthening program to promote return to prior level of function.      PT Education - 05/25/16 1237    Education provided Yes   Education Details See detailed HEP in pt instructions: LE stretching/strengthening program to tolerance. Encouraged ice as anti inflammatory/pain relief.    Person(s) Educated Patient   Methods Explanation;Demonstration;Handout   Comprehension Verbalized understanding;Returned demonstration             PT Long Term Goals - 05/25/16 1255      PT LONG TERM GOAL #1   Title Pt will score <30% on Modified Oswestry to promote increase  in functional mobility so she can perform household duties.   Baseline 11/14: 44%   Time 4   Period Weeks   Status New     PT LONG TERM GOAL #2   Title Pt will increase strength in LE by 1/2 grade per MMT so that she can begin exercise program (walking >1 mile)   Baseline 11/14: Strength MMT LE R/L: hip flexion 4-/4+ (pain limited on RLE), knee extension 4-/4+ (pain limited on RLE), knee flexion 4+/5, dorsiflexion 5/5, plantarflexion 4-/5 (pain limited on RLE).   Time 4   Period Weeks   Status New     PT LONG TERM GOAL #3   Title Pt will experience <2/10 R leg/hip pain with independent ambulation to progress toward regular walking program  Baseline 11/14: 4/10 leg pain with standing/amb with use of SPC   Time 4   Period Weeks   Status New     PT LONG TERM GOAL #4   Title Pt will ambulate with normalized gait pattern without assistive device for >10 minutes so that she can look after her grandchild   Baseline 11/14: ambulates with moderate antalgic gait pattern with SPC   Time 4   Period Weeks   Status New     PT LONG TERM GOAL #5   Title Pt will tolerate lumbar ROM all planes with <2/10 back/hip pain so that she can perform household duties/gardening.   Baseline 11/16 Lumbar ROM: lumbar flexion WFL painless, lumbar ext 50% limited with onset of R thigh pain, R lateral lumbar flexion 50% limited with onset of R thigh pain, L lateral lumbar flexion 25% limited painless, R rotation 25% limited onset of R thigh pain, L rotation WFL/painless.   Time 4   Period Weeks   Status New               Plan - 05/25/16 1239    Clinical Impression Statement Pt is a pleasant 70 year old female referred for physical therapy services s/p L4-L5 fusion. She arrives at PT clinic ambulating with SPC; moderate antalgic gait secondary to right hip pain/weakness. Pain: At best 0/10, worst 4/10 (pt states she has stayed away from aggravating activities since surgery), current 4/10. Pt states that  pain is primarily in R hip along anterior/medial hip; reports sensation of "deep" pain aggravated by hip flexion. Lumbar ROM: lumbar flexion WFL painless, lumbar ext 50% limited with onset of R thigh pain, R lateral lumbar flexion 50% limited with onset of R thigh pain, L lateral lumbar flexion 25% limited painless, R rotation 25% limited onset of R thigh pain, L rotation WFL/painless.  Strength MMT LE R/L: hip flexion 4-/4+ (pain limited on RLE), knee extension 4-/4+ (pain limited on RLE), knee flexion 4+/5, dorsiflexion 5/5, plantarflexion 4-/5 (pain limited on RLE). Muscle length: hamstring WFL with no c/o. piriformis: mild stiffness LLE, pt reported sensation of deep R hip pain with hip ER past neutral with hip in flexed position, quadriceps: mild limitation bilaterally, hip flexor: mild limitation bilaterally. Special tests: Slump test (-) bilaterally. Denies N/T - other than permanent LLE sensation loss post surgery. Denies bowel/bladder issues. Outcome Measure: 44% Moderate Self Perceived Disability. Pt will benefit from skilled physical therapy to promote LE strengthening, gradual progression to increase level/intensity of mobility so that she can return to pre-surgery functional mobility.   Rehab Potential Good   Clinical Impairments Affecting Rehab Potential Negative: Co morbidities. Chronic hip pain. Positive: Motivated. Prior level of activity.   PT Frequency 2x / week   PT Duration 4 weeks   PT Treatment/Interventions ADLs/Self Care Home Management;Cryotherapy;Electrical Stimulation;Moist Heat;DME Instruction;Gait training;Stair training;Functional mobility training;Therapeutic activities;Therapeutic exercise;Balance training;Neuromuscular re-education;Patient/family education;Manual techniques;Passive range of motion   PT Next Visit Plan Hip OA special test. Balance assessment to ensure safety with SPC. Walking endurance. Progressive LE strengthening exercises to pt tolerance.   PT Home Exercise  Plan see pt instructions for detailed HEP   Consulted and Agree with Plan of Care Patient      Patient will benefit from skilled therapeutic intervention in order to improve the following deficits and impairments:  Abnormal gait, Decreased activity tolerance, Decreased balance, Decreased coordination, Decreased endurance, Decreased mobility, Decreased range of motion, Decreased strength, Difficulty walking, Impaired flexibility  Visit Diagnosis: Difficulty in walking,  not elsewhere classified  Muscle weakness (generalized)  Chronic bilateral low back pain without sciatica  Pain in right hip     Problem List Patient Active Problem List   Diagnosis Date Noted  . Spondylolisthesis of lumbar region 04/08/2016  . Obesity (BMI 30-39.9) 07/18/2014  . DDD (degenerative disc disease), lumbar 12/04/2013  . Spinal stenosis of lumbar region 11/13/2013  . Carpal tunnel syndrome 11/13/2013  . Osteoporosis 08/15/2012  . Medicare annual wellness visit, subsequent 05/11/2012  . Hypothyroidism 02/10/2012  . Iron deficiency anemia 08/13/2011  . Hyperlipidemia 08/13/2011  . Hx of adenomatous colonic polyps 06/08/2011   Pura Spice, PT, DPT # [id number] Mickel Baas Braxdon Gappa SPT 05/25/2016, 1:12 PM  [facility] [address] [place], [place], [address] Phone: [phone]   Fax:  [phone]  Name: Janet Rice MRN: [id number] Date of Birth: Jun 07, 1946

## 2016-05-26 ENCOUNTER — Encounter: Payer: Self-pay | Admitting: Physical Therapy

## 2016-05-31 ENCOUNTER — Ambulatory Visit: Payer: Medicare Other

## 2016-05-31 DIAGNOSIS — M6281 Muscle weakness (generalized): Secondary | ICD-10-CM

## 2016-05-31 DIAGNOSIS — M545 Low back pain: Secondary | ICD-10-CM | POA: Diagnosis not present

## 2016-05-31 DIAGNOSIS — R262 Difficulty in walking, not elsewhere classified: Secondary | ICD-10-CM

## 2016-05-31 DIAGNOSIS — M25551 Pain in right hip: Secondary | ICD-10-CM | POA: Diagnosis not present

## 2016-05-31 DIAGNOSIS — G8929 Other chronic pain: Secondary | ICD-10-CM | POA: Diagnosis not present

## 2016-05-31 NOTE — Therapy (Signed)
[place] [facility] [address]. [place], [place], [address] Phone: [phone]   Fax:  [phone]  Physical Therapy Treatment  Patient Details  Name: Janet Rice MRN: [id number] Date of Birth: 29-Aug-1945 Referring Provider: Ophelia Charter MD  Encounter Date: 05/31/2016      PT End of Session - 05/31/16 1349    Visit Number 2   Number of Visits 9   Date for PT Re-Evaluation 06/24/16   Authorization - Visit Number 2   Authorization - Number of Visits 10   PT Start Time [id number]   PT Stop Time 1430   PT Time Calculation (min) 45 min   Activity Tolerance Patient tolerated treatment well;Patient limited by pain   Behavior During Therapy [place] for tasks assessed/performed      Past Medical History:  Diagnosis Date  . Adenomatous colon polyp   . Arthritis   . Childhood asthma   . Chronic heartburn    On omeprazole  . External hemorrhoids   . GERD (gastroesophageal reflux disease)   . Heme positive stool   . Hyperlipidemia   . Hypothyroidism   . Iron deficiency anemia   . Metastatic melanoma ([place])    Followed by Dr Oliva Bustard, previous chemo, no recurrence, 2008?    Past Surgical History:  Procedure Laterality Date  . ABDOMINAL HYSTERECTOMY  1984  . CHOLECYSTECTOMY  1990  . COLONOSCOPY  08/2006   Four sessile polyps found and removed in sigmoid colon at splenic flexure and ascending colon. 7 mm in size. Another removed from transverse colon 4 mm. Path Report - showed tubular adenoma and hyperplastic polyp. Advised to repeat in 3.5 years (02/2010)  . COLONOSCOPY  3.2.2011   8 mm polyp in sigmoid colon, removed, 4 mm polyp in descending colon, removed. Internal hemorrhoids  . ESOPHAGOGASTRODUODENOSCOPY  3.2.2011   Large hiatia hernia, esophagus normal, mulitple small sessile polyps w/no stigmata of recent bleeding found. Mildy erythermatous mucosa w/no bleeding found in gatric antrum. Normal duodenum. Bx done of gastric mucoal  abnormalitiy and duodeunum. PATH - no active inflammation, antral mucosa w/mild foveolar hyperplasia  . Lymph Node Removal  12/2005   Left inguinal lymph node dissection  . MELANOMA EXCISION  1993   thigh  . ORIF ANKLE FRACTURE Right    Dr. Mack Guise    There were no vitals filed for this visit.      Subjective Assessment - 05/31/16 1347    Subjective Pt reports her R hip pain can be debilitating at time. She was able to perform HEP but noted increase in R hip pain following exercises. No specific questions at this time.    Pertinent History s/p L4-L5 fusion 9/28.    Limitations Sitting;Lifting;Standing;Walking;House hold activities   How long can you sit comfortably? <1 hr before increase in low back pain   How long can you stand comfortably? >30 minutes; prefers UE support on cane or shopping cart   How long can you walk comfortably? >30 minutes if UE support on cane or shopping cart; pt currently limiting activity level   Patient Stated Goals pt would like to be able to walk independently and get back into a regular exercise program   Currently in Pain? No/denies      .      [facility] PT Assessment - 05/31/16 1403      Standardized Balance Assessment   Standardized Balance Assessment Berg Balance Test     [facility] Balance Test   Sit  to Stand Able to stand without using hands and stabilize independently   Standing Unsupported Able to stand safely 2 minutes   Sitting with Back Unsupported but Feet Supported on Floor or Stool Able to sit safely and securely 2 minutes   Stand to Sit Sits safely with minimal use of hands   Transfers Able to transfer safely, minor use of hands   Standing Unsupported with Eyes Closed Able to stand 10 seconds safely   Standing Ubsupported with Feet Together Able to place feet together independently and stand 1 minute safely   From Standing, Reach Forward with Outstretched Arm Can reach confidently >25 cm (10")   From Standing Position, Pick up Object from  Floor Able to pick up shoe safely and easily   From Standing Position, Turn to Look Behind Over each Shoulder Looks behind from both sides and weight shifts well   Turn 360 Degrees Able to turn 360 degrees safely in 4 seconds or less   Standing Unsupported, Alternately Place Feet on Step/Stool Able to stand independently and safely and complete 8 steps in 20 seconds   Standing Unsupported, One Foot in Front Able to plae foot ahead of the other independently and hold 30 seconds   Standing on One Leg Tries to lift leg/unable to hold 3 seconds but remains standing independently   Total Score 52   Berg comment: Appropriate to utilize spc for ambulation       Objective:  Neuromuscular Re-education Performed BERG with patient who scored 52/56, appropriate for pt to utilize spc for ambulation;  Therapeutic Exercise Reviewed HEP with patient and performed: Standing quad stretch with gait belt assist 20 seconds x 2 bilateral; Standing hip flexor stretch 20 sec x 2 bilateral, attempted on plinth but pt with excessive exertion and poor form; Seated piriformis stretch L hip x 30 seconds, attempted R hip but too painful; Hooklying bridges 3 x 10;  Manual Therapy R hip scour with immediate reproduction of R groin pain in multiple positions; R hip long axis distraction with belt, gentle 30 seconds x 2; R hip inferior grade I mobilizations with belt at 90 hip flexion 30 seconds x 3; R hip medial to lateral grade 1 mobilizations with hip at 45 degrees flexion 30 seconds x 3, attempted at 90 flexion but pt unable to tolerate; Hooklying gentle PROM lumbar rotation, pain-free x 2 minitues; L sidelying R hip flexor stretch 30 seconds x 3;  Pt response for medical necessity: Pt demonstrates deficits in lower extremity strength and lumbar mobility. She is primarily limited by onset of R hip pain with functional activities. Pt with onset of R hip pain especially with lumbar extension, long lever active hip  flexion, and supine R hip PROM and scour. Pt will benefit from progressive LE strengthening program to promote return to prior level of function                       PT Education - 05/31/16 1349    Education provided Yes   Education Details Reinforced HEP, not progressed on this date.    Person(s) Educated Patient   Comprehension Verbalized understanding             PT Long Term Goals - 05/25/16 1255      PT LONG TERM GOAL #1   Title Pt will score <30% on Modified Oswestry to promote increase in functional mobility so she can perform household duties.   Baseline 11/14: 44%  Time 4   Period Weeks   Status New     PT LONG TERM GOAL #2   Title Pt will increase strength in LE by 1/2 grade per MMT so that she can begin exercise program (walking >1 mile)   Baseline 11/14: Strength MMT LE R/L: hip flexion 4-/4+ (pain limited on RLE), knee extension 4-/4+ (pain limited on RLE), knee flexion 4+/5, dorsiflexion 5/5, plantarflexion 4-/5 (pain limited on RLE).   Time 4   Period Weeks   Status New     PT LONG TERM GOAL #3   Title Pt will experience <2/10 R leg/hip pain with independent ambulation to progress toward regular walking program   Baseline 11/14: 4/10 leg pain with standing/amb with use of SPC   Time 4   Period Weeks   Status New     PT LONG TERM GOAL #4   Title Pt will ambulate with normalized gait pattern without assistive device for >10 minutes so that she can look after her grandchild   Baseline 11/14: ambulates with moderate antalgic gait pattern with SPC   Time 4   Period Weeks   Status New     PT LONG TERM GOAL #5   Title Pt will tolerate lumbar ROM all planes with <2/10 back/hip pain so that she can perform household duties/gardening.   Baseline 11/16 Lumbar ROM: lumbar flexion WFL painless, lumbar ext 50% limited with onset of R thigh pain, R lateral lumbar flexion 50% limited with onset of R thigh pain, L lateral lumbar flexion 25% limited  painless, R rotation 25% limited onset of R thigh pain, L rotation WFL/painless.   Time 4   Period Weeks   Status New               Plan - 05/31/16 1349    Clinical Impression Statement Performed BERG with patient ans score of 52/56 justifies use of spc for ambulation. Pt is frustrated regarding her R hip pain on this date. Pain is easily reproduced with R hip scour, SLR, and PROM of hip into both flexion and adduction (separtely not combined motion). Hip pain appears to be related to degenerative changes. Pain improves with long axis distraction but improves once tension is released. R hip PROM is significant limited in IR, ER, and extension. Pt provided modification for standing hip flexor stretch. Encouraged to continue HEP. Pt reports decrease in pain with gentle pain-free lumbar PROM in hooklying. Encouraged pt that she can perform at home as long as she limits range and avoids pain. Pt encouraged to follow-up as scheduled.    Rehab Potential Good   Clinical Impairments Affecting Rehab Potential Negative: Co morbidities. Chronic hip pain. Positive: Motivated. Prior level of activity.   PT Frequency 2x / week   PT Duration 4 weeks   PT Treatment/Interventions ADLs/Self Care Home Management;Cryotherapy;Electrical Stimulation;Moist Heat;DME Instruction;Gait training;Stair training;Functional mobility training;Therapeutic activities;Therapeutic exercise;Balance training;Neuromuscular re-education;Patient/family education;Manual techniques;Passive range of motion   PT Next Visit Plan Manual therapy of R hip with gentle PROM to decrease pain. Progress core and LE strengthening to patient tolerance.    PT Home Exercise Plan see pt instructions for detailed HEP. continue as prescribed   Consulted and Agree with Plan of Care Patient      Patient will benefit from skilled therapeutic intervention in order to improve the following deficits and impairments:  Abnormal gait, Decreased activity  tolerance, Decreased balance, Decreased coordination, Decreased endurance, Decreased mobility, Decreased range of motion, Decreased strength, Difficulty walking, Impaired  flexibility  Visit Diagnosis: Difficulty in walking, not elsewhere classified  Muscle weakness (generalized)     Problem List Patient Active Problem List   Diagnosis Date Noted  . Spondylolisthesis of lumbar region 04/08/2016  . Obesity (BMI 30-39.9) 07/18/2014  . DDD (degenerative disc disease), lumbar 12/04/2013  . Spinal stenosis of lumbar region 11/13/2013  . Carpal tunnel syndrome 11/13/2013  . Osteoporosis 08/15/2012  . Medicare annual wellness visit, subsequent 05/11/2012  . Hypothyroidism 02/10/2012  . Iron deficiency anemia 08/13/2011  . Hyperlipidemia 08/13/2011  . Hx of adenomatous colonic polyps 06/08/2011   Phillips Grout PT, DPT   Huprich,Jason 06/01/2016, 9:07 AM  [place] [facility] [address] [place], [place], [address] Phone: [phone]   Fax:  [phone]  Name: Janet Rice MRN: [id number] Date of Birth: 11/24/1945

## 2016-06-07 ENCOUNTER — Ambulatory Visit: Payer: Medicare Other

## 2016-06-07 ENCOUNTER — Ambulatory Visit: Payer: Medicare Other | Admitting: Oncology

## 2016-06-10 ENCOUNTER — Encounter: Payer: Self-pay | Admitting: Oncology

## 2016-06-10 ENCOUNTER — Ambulatory Visit: Payer: Medicare Other | Admitting: Physical Therapy

## 2016-06-10 DIAGNOSIS — D509 Iron deficiency anemia, unspecified: Secondary | ICD-10-CM | POA: Diagnosis not present

## 2016-06-10 DIAGNOSIS — D649 Anemia, unspecified: Secondary | ICD-10-CM | POA: Diagnosis not present

## 2016-06-14 ENCOUNTER — Inpatient Hospital Stay: Payer: Medicare Other | Attending: Oncology | Admitting: Oncology

## 2016-06-14 ENCOUNTER — Ambulatory Visit: Payer: Medicare Other | Admitting: Physical Therapy

## 2016-06-14 ENCOUNTER — Encounter: Payer: Self-pay | Admitting: Oncology

## 2016-06-14 ENCOUNTER — Inpatient Hospital Stay: Payer: Medicare Other

## 2016-06-14 VITALS — BP 128/77 | HR 67 | Temp 98.6°F | Ht 61.0 in | Wt 217.4 lb

## 2016-06-14 DIAGNOSIS — Z85820 Personal history of malignant melanoma of skin: Secondary | ICD-10-CM | POA: Insufficient documentation

## 2016-06-14 DIAGNOSIS — K219 Gastro-esophageal reflux disease without esophagitis: Secondary | ICD-10-CM | POA: Diagnosis not present

## 2016-06-14 DIAGNOSIS — E785 Hyperlipidemia, unspecified: Secondary | ICD-10-CM | POA: Diagnosis not present

## 2016-06-14 DIAGNOSIS — J45909 Unspecified asthma, uncomplicated: Secondary | ICD-10-CM | POA: Diagnosis not present

## 2016-06-14 DIAGNOSIS — E039 Hypothyroidism, unspecified: Secondary | ICD-10-CM | POA: Diagnosis not present

## 2016-06-14 DIAGNOSIS — D509 Iron deficiency anemia, unspecified: Secondary | ICD-10-CM | POA: Diagnosis not present

## 2016-06-14 DIAGNOSIS — Z87891 Personal history of nicotine dependence: Secondary | ICD-10-CM | POA: Diagnosis not present

## 2016-06-14 DIAGNOSIS — D508 Other iron deficiency anemias: Secondary | ICD-10-CM

## 2016-06-14 DIAGNOSIS — Z90710 Acquired absence of both cervix and uterus: Secondary | ICD-10-CM | POA: Diagnosis not present

## 2016-06-14 DIAGNOSIS — Z79899 Other long term (current) drug therapy: Secondary | ICD-10-CM | POA: Insufficient documentation

## 2016-06-14 NOTE — Progress Notes (Signed)
[place]  [phone]  Fax:([phone]) [place] DOB: July 22, 1945  MR#: [id number]  [id number]  Patient Care Team: [name], DO as PCP - General (Family Medicine)  CHIEF COMPLAINT: Iron deficiency anemia.  INTERVAL HISTORY:  Patient returns to clinic for further evaluation and consideration of additional IV Feraheme. She has mild weakness and fatigue, but otherwise feels well. She has no neurologic complaints. She denies any recent fevers or illnesses. She has good appetite and denies weight loss. She denies any chest pain or shortness of breath. She denies any nausea, vomiting, constipation, or diarrhea. She has no melena or hematochezia. She has no urinary complaints. Patient feels at her baseline and offers no specific complaints today.   REVIEW OF SYSTEMS:   Review of Systems  Constitutional: Negative.  Negative for fever, malaise/fatigue and weight loss.  HENT: Negative.   Eyes: Negative.   Respiratory: Negative.  Negative for cough and shortness of breath.   Cardiovascular: Negative for chest pain and leg swelling.  Gastrointestinal: Negative.  Negative for abdominal pain, blood in stool and melena.  Genitourinary: Negative.   Musculoskeletal: Positive for back pain.  Skin: Negative.   Neurological: Negative.  Negative for weakness.  Endo/Heme/Allergies: Negative.   Psychiatric/Behavioral: Negative.  The patient is not nervous/anxious.     As per HPI. Otherwise, a complete review of systems is negative.  ONCOLOGY HISTORY:  No history exists.    PAST MEDICAL HISTORY: Past Medical History:  Diagnosis Date  . Adenomatous colon polyp   . Arthritis   . Childhood asthma   . Chronic heartburn    On omeprazole  . External hemorrhoids   . GERD (gastroesophageal reflux disease)   . Heme positive stool   . Hyperlipidemia   . Hypothyroidism   . Iron deficiency anemia   . Metastatic melanoma ([place])    Followed by Dr  Oliva Bustard, previous chemo, no recurrence, 2008?    PAST SURGICAL HISTORY: Past Surgical History:  Procedure Laterality Date  . ABDOMINAL HYSTERECTOMY  1984  . CHOLECYSTECTOMY  1990  . COLONOSCOPY  08/2006   Four sessile polyps found and removed in sigmoid colon at splenic flexure and ascending colon. 7 mm in size. Another removed from transverse colon 4 mm. Path Report - showed tubular adenoma and hyperplastic polyp. Advised to repeat in 3.5 years (02/2010)  . COLONOSCOPY  3.2.2011   8 mm polyp in sigmoid colon, removed, 4 mm polyp in descending colon, removed. Internal hemorrhoids  . ESOPHAGOGASTRODUODENOSCOPY  3.2.2011   Large hiatia hernia, esophagus normal, mulitple small sessile polyps w/no stigmata of recent bleeding found. Mildy erythermatous mucosa w/no bleeding found in gatric antrum. Normal duodenum. 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 PATH - no active inflammation, antral mucosa w/mild foveolar hyperplasia  . Lymph Node Removal  12/2005   Left inguinal lymph node dissection  . MELANOMA EXCISION  1993   thigh  . ORIF ANKLE FRACTURE Right    Dr. Mack Guise    FAMILY HISTORY Family History  Problem Relation Age of Onset  . Aneurysm Mother   . Heart disease Mother   . Colon polyps Father   . Diabetes Father   . Leukemia Maternal Grandfather   . Skin cancer Paternal Grandfather   . Diabetes Other   . Colon polyps Other   . Colon cancer Neg Hx   . Breast cancer Neg Hx     GYNECOLOGIC HISTORY:  No LMP recorded. Patient has  had a hysterectomy.     ADVANCED DIRECTIVES:    HEALTH MAINTENANCE: Social History  Substance Use Topics  . Smoking status: Former Smoker    Packs/day: 0.50    Years: 8.00    Types: Cigarettes    Quit date: 07/12/1978  . Smokeless tobacco: Never Used  . Alcohol use No    Allergies  Allergen Reactions  . Sulfa Antibiotics Rash    Current Outpatient Prescriptions  Medication Sig Dispense Refill  . b complex vitamins tablet  Take 1 tablet by mouth daily.    . Calcium-Magnesium-Vitamin D (CALCIUM 1200+D3 PO) Take 1 tablet by mouth daily.    . citalopram (CELEXA) 20 MG tablet Take 1 tablet (20 mg total) by mouth daily. 90 tablet 2  . ferrous sulfate 325 (65 FE) MG tablet Take 325 mg by mouth daily with breakfast.    . Glucosamine-Chondroit-Vit C-Mn (GLUCOSAMINE 1500 COMPLEX PO) Take 1 capsule by mouth daily.    Marland Kitchen levothyroxine (SYNTHROID, LEVOTHROID) 125 MCG tablet Take 1 tablet (125 mcg total) by mouth daily before breakfast. 90 tablet 3  . Multiple Vitamins-Minerals (EYE VITAMINS & MINERALS) TABS Take 2 tablets by mouth daily.     Marland Kitchen omeprazole (PRILOSEC) 20 MG capsule Take 1 capsule (20 mg total) by mouth daily. 90 capsule 3  . pravastatin (PRAVACHOL) 40 MG tablet Take 0.5 tablets (20 mg total) by mouth daily. 45 tablet 3  . Probiotic Product (PROBIOTIC DAILY PO) Take 1 tablet by mouth daily.     . TURMERIC PO Take 1 tablet by mouth daily.     No current facility-administered medications for this visit.     OBJECTIVE: BP 128/77 (BP Location: Left Arm, Patient Position: Sitting)   Pulse 67   Temp 98.6 F (37 C) (Oral)   Ht 5\' 1"  (1.549 m)   Wt 217 lb 6 oz (98.6 kg)   BMI 41.07 kg/m    Body mass index is 41.07 kg/m.    ECOG FS:0 - Asymptomatic  General: Well-developed, well-nourished, no acute distress. Eyes: Pink conjunctiva, anicteric sclera. Lungs: Clear to auscultation bilaterally. Heart: Regular rate and rhythm. No rubs, murmurs, or gallops. Abdomen: Soft, nontender, nondistended. No organomegaly noted, normoactive bowel sounds. Musculoskeletal: No edema, cyanosis, or clubbing. Neuro: Alert, answering all questions appropriately. Cranial nerves grossly intact. Skin: No rashes or petechiae noted. Psych: Normal affect.   LAB RESULTS:  June 10, 2016: WBC 5.9, hemoglobin 11.1, MCV 82, platelets 149, iron saturation 8, total iron 29, ferritin 41.  STUDIES: No results found.  ASSESSMENT &  PLAN:    1. Iron deficiency anemia: Patient reportedly had a normal colonoscopy in March 2016. Her hemoglobin has improved and is now greater than 11.0. She has a mildly decreased iron saturation, but is asymptomatic therefore does not require IV Feraheme today. She likely will require treatment in the future. No intervention is needed. Return to clinic in 3 months with repeat laboratory work, further evaluation, and consideration of additional IV iron. Patient gets her labwork completed at [facility].  2. History of melanoma: Melanoma of left thigh status post resection in 1993, exact stage is not known, metastatic to inguinal lymph node. No evidence of recurrent disease. 3. Back Pain: Patient had surgery in September 2017. Patient expressed understanding and was in agreement with this plan. She also understands that She can call clinic at any time with any questions, concerns, or complaints.     Lloyd Huger, MD   06/16/2016 9:30 AM

## 2016-06-14 NOTE — Progress Notes (Signed)
Patient here for follow up and lab result. She had back surgery in September.

## 2016-06-24 ENCOUNTER — Ambulatory Visit
Admission: RE | Admit: 2016-06-24 | Discharge: 2016-06-24 | Disposition: A | Discharge status: Home / Self Care | Payer: Medicare Other | Source: Ambulatory Visit | Attending: Family Medicine | Admitting: Family Medicine

## 2016-06-24 DIAGNOSIS — Z1231 Encounter for screening mammogram for malignant neoplasm of breast: Secondary | ICD-10-CM | POA: Insufficient documentation

## 2016-06-24 DIAGNOSIS — M7541 Impingement syndrome of right shoulder: Secondary | ICD-10-CM | POA: Diagnosis not present

## 2016-06-24 DIAGNOSIS — M1611 Unilateral primary osteoarthritis, right hip: Secondary | ICD-10-CM | POA: Diagnosis not present

## 2016-07-14 DIAGNOSIS — L820 Inflamed seborrheic keratosis: Secondary | ICD-10-CM | POA: Diagnosis not present

## 2016-07-14 DIAGNOSIS — Z85820 Personal history of malignant melanoma of skin: Secondary | ICD-10-CM | POA: Diagnosis not present

## 2016-07-14 DIAGNOSIS — R600 Localized edema: Secondary | ICD-10-CM | POA: Diagnosis not present

## 2016-07-14 DIAGNOSIS — Z85828 Personal history of other malignant neoplasm of skin: Secondary | ICD-10-CM | POA: Diagnosis not present

## 2016-08-04 DIAGNOSIS — G5601 Carpal tunnel syndrome, right upper limb: Secondary | ICD-10-CM | POA: Diagnosis not present

## 2016-08-04 DIAGNOSIS — M1611 Unilateral primary osteoarthritis, right hip: Secondary | ICD-10-CM | POA: Diagnosis not present

## 2016-08-19 DIAGNOSIS — G5601 Carpal tunnel syndrome, right upper limb: Secondary | ICD-10-CM | POA: Diagnosis not present

## 2016-08-23 DIAGNOSIS — R030 Elevated blood-pressure reading, without diagnosis of hypertension: Secondary | ICD-10-CM | POA: Diagnosis not present

## 2016-08-23 DIAGNOSIS — M25551 Pain in right hip: Secondary | ICD-10-CM | POA: Diagnosis not present

## 2016-08-23 DIAGNOSIS — Z6839 Body mass index (BMI) 39.0-39.9, adult: Secondary | ICD-10-CM | POA: Diagnosis not present

## 2016-08-23 DIAGNOSIS — M4316 Spondylolisthesis, lumbar region: Secondary | ICD-10-CM | POA: Diagnosis not present

## 2016-08-24 DIAGNOSIS — G5601 Carpal tunnel syndrome, right upper limb: Secondary | ICD-10-CM | POA: Diagnosis not present

## 2016-08-24 DIAGNOSIS — M1611 Unilateral primary osteoarthritis, right hip: Secondary | ICD-10-CM | POA: Diagnosis not present

## 2016-08-27 ENCOUNTER — Telehealth: Payer: Self-pay | Admitting: *Deleted

## 2016-08-27 ENCOUNTER — Other Ambulatory Visit: Payer: Self-pay | Admitting: Orthopedic Surgery

## 2016-08-27 NOTE — Telephone Encounter (Signed)
Pt called and told we had received form and would need her to come in for an appointment. Pt scheduled for 08/31/16 @ 11:15 a.m.

## 2016-08-27 NOTE — Telephone Encounter (Signed)
Pt questioned if forms were received from [facility] for a medical clearance .  Pt contact [phone]

## 2016-08-31 ENCOUNTER — Encounter: Payer: Self-pay | Admitting: Family Medicine

## 2016-08-31 ENCOUNTER — Ambulatory Visit (INDEPENDENT_AMBULATORY_CARE_PROVIDER_SITE_OTHER): Payer: Medicare Other | Admitting: Family Medicine

## 2016-08-31 ENCOUNTER — Encounter
Admission: RE | Admit: 2016-08-31 | Discharge: 2016-08-31 | Disposition: A | Discharge status: Home / Self Care | Payer: Medicare Other | Source: Ambulatory Visit | Attending: Orthopedic Surgery | Admitting: Orthopedic Surgery

## 2016-08-31 VITALS — BP 131/72 | HR 90 | Temp 98.1°F | Wt 207.4 lb

## 2016-08-31 DIAGNOSIS — M169 Osteoarthritis of hip, unspecified: Secondary | ICD-10-CM | POA: Insufficient documentation

## 2016-08-31 DIAGNOSIS — M1611 Unilateral primary osteoarthritis, right hip: Secondary | ICD-10-CM | POA: Diagnosis not present

## 2016-08-31 DIAGNOSIS — E669 Obesity, unspecified: Secondary | ICD-10-CM | POA: Diagnosis not present

## 2016-08-31 DIAGNOSIS — E039 Hypothyroidism, unspecified: Secondary | ICD-10-CM

## 2016-08-31 DIAGNOSIS — Z01818 Encounter for other preprocedural examination: Secondary | ICD-10-CM | POA: Insufficient documentation

## 2016-08-31 DIAGNOSIS — D509 Iron deficiency anemia, unspecified: Secondary | ICD-10-CM | POA: Diagnosis not present

## 2016-08-31 DIAGNOSIS — Z23 Encounter for immunization: Secondary | ICD-10-CM

## 2016-08-31 DIAGNOSIS — G5601 Carpal tunnel syndrome, right upper limb: Secondary | ICD-10-CM | POA: Diagnosis not present

## 2016-08-31 DIAGNOSIS — R739 Hyperglycemia, unspecified: Secondary | ICD-10-CM

## 2016-08-31 DIAGNOSIS — M1612 Unilateral primary osteoarthritis, left hip: Secondary | ICD-10-CM | POA: Insufficient documentation

## 2016-08-31 DIAGNOSIS — E785 Hyperlipidemia, unspecified: Secondary | ICD-10-CM | POA: Diagnosis not present

## 2016-08-31 HISTORY — DX: Other complications of anesthesia, initial encounter: T88.59XA

## 2016-08-31 HISTORY — DX: Adverse effect of unspecified anesthetic, initial encounter: T41.45XA

## 2016-08-31 NOTE — Progress Notes (Addendum)
Subjective:  Patient ID: Janet Rice, female    DOB: 1945/10/08  Age: 71 y.o. MRN: [id number]  CC: Surgical clearance  HPI:  71 year old female with obesity, hypothyroidism, hyperlipidemia, iron deficiency anemia, carpal tunnel presents for surgical clearance.  Patient has upcoming carpal tunnel surgery as she has had progressive/persistent symptoms. She has had prior surgery on her left side. Additionally, she has severe osteoporosis of the right hip. She is planning to have total hip replacement following carpal tunnel surgery (4-6 weeks after). She has no other complaints or concerns at this time. Her hemoglobin has been stable. She is followed by hematology regarding iron deficiency anemia and has been receiving iron infusions. She has no shortness of breath. No chest pain. She states that she is able to climb a flight of stairs without difficulty (minus pain).  Social Hx   Social History   Social History  . Marital status: Married    Spouse name: N/A  . Number of children: 2  . Years of education: N/A   Occupational History  . 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 Smoking status: Former Smoker    Packs/day: 0.50    Years: 8.00    Types: Cigarettes    Quit date: 07/12/1978  . Smokeless tobacco: Never Used  . Alcohol use No  . Drug use: No  . Sexual activity: Not Currently   Other Topics Concern  . None   Social History Narrative  . None   Review of Systems  Musculoskeletal: Positive for arthralgias.  Neurological: Positive for numbness.   Objective:  BP 131/72   Pulse 90   Temp 98.1 F (36.7 C) (Oral)   Wt 207 lb 6.4 oz (94.1 kg)   SpO2 93%   BMI 39.19 kg/m   BP/Weight 08/31/2016 08/31/2016 08/16/971  Systolic BP 532 - 992  Diastolic BP 72 - 77  Wt. (Lbs) 207.4 205 217.37  BMI 39.19 38.73 41.07   Physical Exam  Constitutional: She is oriented to person, place, and time. She appears well-developed. No distress.  HENT:  Head:  Normocephalic and atraumatic.  Eyes: Conjunctivae are normal.  Cardiovascular: Normal rate and regular rhythm.   Pulmonary/Chest: Effort normal and breath sounds normal.  Neurological: She is alert and oriented to person, place, and time.  Psychiatric: She has a normal mood and affect.  Vitals reviewed.  Lab Results  Component Value Date   WBC 11.9 (H) 04/09/2016   HGB 9.2 (L) 04/09/2016   HCT 31.6 (L) 04/09/2016   PLT 172 04/09/2016   GLUCOSE 145 (H) 04/09/2016   CHOL 201 (H) 12/04/2015   TRIG 167 (H) 12/04/2015   HDL 58 12/04/2015   LDLCALC 110 (H) 12/04/2015   ALT 33 (H) 12/04/2015   AST 30 12/04/2015   NA 137 04/09/2016   K 4.0 04/09/2016   CL 101 04/09/2016   CREATININE 1.01 (H) 04/09/2016   BUN 9 04/09/2016   CO2 29 04/09/2016   TSH 0.846 12/04/2015   INR 1.0 07/10/2013   HGBA1C 5.4 12/04/2015    Assessment & Plan:   Problem List Items Addressed This Visit    Preoperative clearance - Primary    New issue. Hemoglobin has been stable. Labs today at request of Ortho. EKG done today and was normal (Interpretation: Normal sinus rhythm at a rate of 68. No ST or T-wave changes. Normal intervals. Normal EKG. Risk of complication per NSQIP (for total hip arthoplasty) = 3.7%. Functional status >= 4  mets (can go up flight of stairs). No indication for additional cardiac workup. Pending laboratory studies, can proceed with surgery for both carpal tunnel release and total hip arthroscopy.      Relevant Orders   EKG 12-Lead (Completed)   Comp Met (CMET)   Urinalysis   Obesity (BMI 30-39.9)   Iron deficiency anemia   Relevant Orders   CBC   Hypothyroidism   Relevant Orders   TSH   Hyperlipidemia   Relevant Orders   Lipid panel   Hip osteoarthritis   Carpal tunnel syndrome    Other Visit Diagnoses    Blood glucose elevated       Relevant Orders   HgB A1c   Encounter for immunization       Relevant Orders   Flu vaccine HIGH DOSE PF (Completed)     Follow-up:  PRN  [place]

## 2016-08-31 NOTE — Patient Instructions (Signed)
Go get your labs.  We will take care of the surgical clearance.  Take care  Dr. Lacinda Axon

## 2016-08-31 NOTE — Patient Instructions (Signed)
  Your procedure is scheduled on: 09-07-16 Report to Same Day Surgery 2nd floor medical mall [facility] Entrance-take elevator on left to 2nd floor.  Check in with surgery information desk.) To find out your arrival time please call [phone] between 1PM - 3PM on 09-06-16  Remember: Instructions that are not followed completely may result in serious medical risk, up to and including death, or upon the discretion of your surgeon and anesthesiologist your surgery may need to be rescheduled.    _x___ 1. Do not eat food or drink liquids after midnight. No gum chewing or hard candies.     __x__ 2. No Alcohol for 24 hours before or after surgery.   __x__3. No Smoking for 24 prior to surgery.   ____  4. Bring all medications with you on the day of surgery if instructed.    __x__ 5. Notify your doctor if there is any change in your medical condition     (cold, fever, infections).     Do not wear jewelry, make-up, hairpins, clips or nail polish.  Do not wear lotions, powders, or perfumes. You may wear deodorant.  Do not shave 48 hours prior to surgery. Men may shave face and neck.  Do not bring valuables to the hospital.    [facility] is not responsible for any belongings or valuables.               Contacts, dentures or bridgework may not be worn into surgery.  Leave your suitcase in the car. After surgery it may be brought to your room.  For patients admitted to the hospital, discharge time is determined by your treatment team.   Patients discharged the day of surgery will not be allowed to drive home.  You will need someone to drive you home and stay with you the night of your procedure.    Please read over the following fact sheets that you were given:   [facility] Preparing for Surgery and or MRSA Information   _x___ Take these medicines the morning of surgery with A SIP OF WATER:    1. LEVOTHYROXINE  2. PRILOSEC  3.  4.  5.  6.  ____Fleets enema or Magnesium Citrate as  directed.   ____ Use CHG Soap or sage wipes as directed on instruction sheet   ____ Use inhalers on the day of surgery and bring to hospital day of surgery  ____ Stop metformin 2 days prior to surgery    ____ Take 1/2 of usual insulin dose the night before surgery and none on the morning of  surgery.   ____ Stop Aspirin, Coumadin, Pllavix ,Eliquis, Effient, or Pradaxa  x__ Stop Anti-inflammatories such as Advil, Aleve, Ibuprofen, Motrin, Naproxen,          Naprosyn, Goodies powders or aspirin products NOW-Ok to take Tylenol/CELEBREX OK TO CONTINUE-DO NOT TAKE AM OF SURGERY   _X___ Stop supplements until after surgery-STOP TURMERIC, CO Q 10 AND B COMPLEX NOW  ____ Bring C-Pap to the hospital.

## 2016-08-31 NOTE — Progress Notes (Signed)
Pre visit review using our clinic review tool, if applicable. No additional management support is needed unless otherwise documented below in the visit note. 

## 2016-08-31 NOTE — Assessment & Plan Note (Signed)
New issue. Hemoglobin has been stable. Labs today at request of Ortho. EKG done today and was normal (Interpretation: Normal sinus rhythm at a rate of 68. No ST or T-wave changes. Normal intervals. Normal EKG. Risk of complication per NSQIP (for total hip arthoplasty) = 3.7%. Functional status >= 4 mets (can go up flight of stairs). No indication for additional cardiac workup. Pending laboratory studies, can proceed with surgery for both carpal tunnel release and total hip arthroscopy.

## 2016-09-01 DIAGNOSIS — E039 Hypothyroidism, unspecified: Secondary | ICD-10-CM | POA: Diagnosis not present

## 2016-09-01 DIAGNOSIS — E785 Hyperlipidemia, unspecified: Secondary | ICD-10-CM | POA: Diagnosis not present

## 2016-09-01 DIAGNOSIS — D509 Iron deficiency anemia, unspecified: Secondary | ICD-10-CM | POA: Diagnosis not present

## 2016-09-01 DIAGNOSIS — Z01818 Encounter for other preprocedural examination: Secondary | ICD-10-CM | POA: Diagnosis not present

## 2016-09-01 DIAGNOSIS — R739 Hyperglycemia, unspecified: Secondary | ICD-10-CM | POA: Diagnosis not present

## 2016-09-01 NOTE — Telephone Encounter (Signed)
[facility] has requested to have medical clearance faxed over when completed  [place] [phone] Ext [phone]

## 2016-09-01 NOTE — Telephone Encounter (Signed)
Awaiting lab results

## 2016-09-02 ENCOUNTER — Telehealth: Payer: Self-pay

## 2016-09-02 ENCOUNTER — Encounter: Payer: Self-pay | Admitting: *Deleted

## 2016-09-02 ENCOUNTER — Other Ambulatory Visit: Payer: Medicare Other

## 2016-09-02 ENCOUNTER — Telehealth: Payer: Self-pay | Admitting: *Deleted

## 2016-09-02 DIAGNOSIS — Z01818 Encounter for other preprocedural examination: Secondary | ICD-10-CM

## 2016-09-02 DIAGNOSIS — R8290 Unspecified abnormal findings in urine: Secondary | ICD-10-CM | POA: Diagnosis not present

## 2016-09-02 LAB — CBC
Hematocrit: 36.4 % (ref 34.0–46.6)
Hemoglobin: 10.8 g/dL — ABNORMAL LOW (ref 11.1–15.9)
MCH: 23.1 pg — ABNORMAL LOW (ref 26.6–33.0)
MCHC: 29.7 g/dL — ABNORMAL LOW (ref 31.5–35.7)
MCV: 78 fL — ABNORMAL LOW (ref 79–97)
Platelets: 146 10*3/uL — ABNORMAL LOW (ref 150–379)
RBC: 4.67 x10E6/uL (ref 3.77–5.28)
RDW: 15.9 % — ABNORMAL HIGH (ref 12.3–15.4)
WBC: 6 10*3/uL (ref 3.4–10.8)

## 2016-09-02 LAB — URINALYSIS
Bilirubin, UA: NEGATIVE
Glucose, UA: NEGATIVE
Ketones, UA: NEGATIVE
Nitrite, UA: POSITIVE — AB
Protein, UA: NEGATIVE
RBC, UA: NEGATIVE
Specific Gravity, UA: 1.013 (ref 1.005–1.030)
Urobilinogen, Ur: 0.2 mg/dL (ref 0.2–1.0)
pH, UA: 6.5 (ref 5.0–7.5)

## 2016-09-02 LAB — COMPREHENSIVE METABOLIC PANEL
ALT: 30 IU/L (ref 0–32)
AST: 27 IU/L (ref 0–40)
Albumin/Globulin Ratio: 1.7 (ref 1.2–2.2)
Albumin: 4.4 g/dL (ref 3.5–4.8)
Alkaline Phosphatase: 151 IU/L — ABNORMAL HIGH (ref 39–117)
BUN/Creatinine Ratio: 11 — ABNORMAL LOW (ref 12–28)
BUN: 11 mg/dL (ref 8–27)
Bilirubin Total: 0.3 mg/dL (ref 0.0–1.2)
CO2: 26 mmol/L (ref 18–29)
Calcium: 9.5 mg/dL (ref 8.7–10.3)
Chloride: 99 mmol/L (ref 96–106)
Creatinine, Ser: 1.03 mg/dL — ABNORMAL HIGH (ref 0.57–1.00)
GFR calc Af Amer: 64 (ref >59)
GFR calc non Af Amer: 55 — ABNORMAL LOW (ref >59)
Globulin, Total: 2.6 (ref 1.5–4.5)
Glucose: 110 mg/dL — ABNORMAL HIGH (ref 65–99)
Potassium: 5.2 mmol/L (ref 3.5–5.2)
Sodium: 143 mmol/L (ref 134–144)
Total Protein: 7 g/dL (ref 6.0–8.5)

## 2016-09-02 LAB — LIPID PANEL
Chol/HDL Ratio: 3.6 (ref 0.0–4.4)
Cholesterol, Total: 203 mg/dL — ABNORMAL HIGH (ref 100–199)
HDL: 57 mg/dL (ref >39)
LDL Calculated: 111 — ABNORMAL HIGH (ref 0–99)
Triglycerides: 177 mg/dL — ABNORMAL HIGH (ref 0–149)
VLDL Cholesterol Cal: 35 (ref 5–40)

## 2016-09-02 LAB — HEMOGLOBIN A1C
Est. average glucose Bld gHb Est-mCnc: 105
Hgb A1c MFr Bld: 5.3 % (ref 4.8–5.6)

## 2016-09-02 LAB — PT AND PTT
INR: 0.9 (ref 0.8–1.2)
Prothrombin Time: 10 s (ref 9.1–12.0)
aPTT: 25 s (ref 24–33)

## 2016-09-02 LAB — TSH: TSH: 0.686 u[IU]/mL (ref 0.450–4.500)

## 2016-09-02 NOTE — Addendum Note (Signed)
Addended by: Arby Barrette on: 09/02/2016 03:46 PM   Modules accepted: Orders

## 2016-09-02 NOTE — Addendum Note (Signed)
Addended by: Arby Barrette on: 09/02/2016 03:44 PM   Modules accepted: Orders

## 2016-09-02 NOTE — Telephone Encounter (Signed)
Information was received and faxed to [facility].

## 2016-09-02 NOTE — Telephone Encounter (Signed)
error 

## 2016-09-02 NOTE — Telephone Encounter (Signed)
-----   Message from [name], DO sent at 09/02/2016  8:26 AM EST ----- Patient needs return for culture given abnormal urinalysis.

## 2016-09-02 NOTE — Telephone Encounter (Signed)
Per [facility], a urine culture can not be added. They only collected/received a urinalysis tube from patient (not the cup).

## 2016-09-02 NOTE — Telephone Encounter (Signed)
Pt was called to come in to give another urine to run a culture pt agreed and placed on lab schedule.

## 2016-09-02 NOTE — Telephone Encounter (Signed)
Thanks

## 2016-09-04 LAB — URINE CULTURE

## 2016-09-06 ENCOUNTER — Other Ambulatory Visit: Payer: Self-pay | Admitting: Family Medicine

## 2016-09-06 MED ORDER — AMOXICILLIN-POT CLAVULANATE 500-125 MG PO TABS: 1.0000 | ORAL_TABLET | Freq: Two times a day (BID) | ORAL | 0 refills | Status: DC

## 2016-09-06 NOTE — Pre-Procedure Instructions (Signed)
MEDICAL CLEARANCE FROM DR Lacinda Axon ON CHART

## 2016-09-07 ENCOUNTER — Encounter: Payer: Self-pay | Admitting: *Deleted

## 2016-09-07 ENCOUNTER — Ambulatory Visit
Admission: RE | Admit: 2016-09-07 | Discharge: 2016-09-07 | Disposition: A | Discharge status: Home / Self Care | Payer: Medicare Other | Source: Ambulatory Visit | Attending: Orthopedic Surgery | Admitting: Orthopedic Surgery

## 2016-09-07 ENCOUNTER — Ambulatory Visit: Payer: Medicare Other | Admitting: Anesthesiology

## 2016-09-07 ENCOUNTER — Encounter
Admission: RE | Disposition: A | Discharge status: Home / Self Care | Payer: Self-pay | Source: Ambulatory Visit | Attending: Orthopedic Surgery

## 2016-09-07 DIAGNOSIS — Z79899 Other long term (current) drug therapy: Secondary | ICD-10-CM | POA: Insufficient documentation

## 2016-09-07 DIAGNOSIS — K219 Gastro-esophageal reflux disease without esophagitis: Secondary | ICD-10-CM | POA: Diagnosis not present

## 2016-09-07 DIAGNOSIS — Z87891 Personal history of nicotine dependence: Secondary | ICD-10-CM | POA: Diagnosis not present

## 2016-09-07 DIAGNOSIS — G5601 Carpal tunnel syndrome, right upper limb: Secondary | ICD-10-CM | POA: Diagnosis not present

## 2016-09-07 DIAGNOSIS — E039 Hypothyroidism, unspecified: Secondary | ICD-10-CM | POA: Diagnosis not present

## 2016-09-07 DIAGNOSIS — E785 Hyperlipidemia, unspecified: Secondary | ICD-10-CM | POA: Diagnosis not present

## 2016-09-07 HISTORY — PX: CARPAL TUNNEL RELEASE: SHX101

## 2016-09-07 SURGERY — CARPAL TUNNEL RELEASE
Anesthesia: General | Laterality: Right | Wound class: Clean

## 2016-09-07 MED ORDER — ACETAMINOPHEN 10 MG/ML IV SOLN
INTRAVENOUS | Status: DC | PRN
  Administered 2016-09-07: 1000 mg via INTRAVENOUS

## 2016-09-07 MED ORDER — CEFAZOLIN SODIUM-DEXTROSE 2-4 GM/100ML-% IV SOLN
INTRAVENOUS | Status: AC
  Filled 2016-09-07: qty 100

## 2016-09-07 MED ORDER — CEFAZOLIN SODIUM-DEXTROSE 2-4 GM/100ML-% IV SOLN
2.0000 g | INTRAVENOUS | Status: AC
  Administered 2016-09-07: 2 g via INTRAVENOUS

## 2016-09-07 MED ORDER — CHLORHEXIDINE GLUCONATE CLOTH 2 % EX PADS
6.0000 | MEDICATED_PAD | Freq: Once | CUTANEOUS | Status: AC
  Administered 2016-09-07: 6 via TOPICAL

## 2016-09-07 MED ORDER — NEOMYCIN-POLYMYXIN B GU 40-200000 IR SOLN
Status: DC | PRN
  Administered 2016-09-07: 2 mL

## 2016-09-07 MED ORDER — GLYCOPYRROLATE 0.2 MG/ML IJ SOLN
INTRAMUSCULAR | Status: AC
  Filled 2016-09-07: qty 1

## 2016-09-07 MED ORDER — FENTANYL CITRATE (PF) 100 MCG/2ML IJ SOLN
INTRAMUSCULAR | Status: DC | PRN
  Administered 2016-09-07: 25 ug via INTRAVENOUS
  Administered 2016-09-07: 50 ug via INTRAVENOUS
  Administered 2016-09-07: 25 ug via INTRAVENOUS

## 2016-09-07 MED ORDER — LACTATED RINGERS IV SOLN
INTRAVENOUS | Status: DC
  Administered 2016-09-07: 09:00:00 via INTRAVENOUS

## 2016-09-07 MED ORDER — LIDOCAINE HCL (PF) 2 % IJ SOLN
INTRAMUSCULAR | Status: AC
  Filled 2016-09-07: qty 2

## 2016-09-07 MED ORDER — ONDANSETRON HCL 4 MG/2ML IJ SOLN
INTRAMUSCULAR | Status: AC
  Filled 2016-09-07: qty 2

## 2016-09-07 MED ORDER — CHLORHEXIDINE GLUCONATE CLOTH 2 % EX PADS: 6.0000 | MEDICATED_PAD | Freq: Once | CUTANEOUS | Status: DC

## 2016-09-07 MED ORDER — GLYCOPYRROLATE 0.2 MG/ML IJ SOLN
INTRAMUSCULAR | Status: DC | PRN
  Administered 2016-09-07: 0.2 mg via INTRAVENOUS

## 2016-09-07 MED ORDER — ACETAMINOPHEN 10 MG/ML IV SOLN
INTRAVENOUS | Status: AC
  Filled 2016-09-07: qty 100

## 2016-09-07 MED ORDER — ONDANSETRON HCL 4 MG/2ML IJ SOLN
INTRAMUSCULAR | Status: DC | PRN
  Administered 2016-09-07: 4 mg via INTRAVENOUS

## 2016-09-07 MED ORDER — PROPOFOL 10 MG/ML IV BOLUS
INTRAVENOUS | Status: DC | PRN
  Administered 2016-09-07: 130 mg via INTRAVENOUS

## 2016-09-07 MED ORDER — FENTANYL CITRATE (PF) 100 MCG/2ML IJ SOLN
INTRAMUSCULAR | Status: AC
  Filled 2016-09-07: qty 2

## 2016-09-07 MED ORDER — LIDOCAINE HCL (CARDIAC) 20 MG/ML IV SOLN
INTRAVENOUS | Status: DC | PRN
  Administered 2016-09-07: 100 mg via INTRAVENOUS

## 2016-09-07 MED ORDER — FENTANYL CITRATE (PF) 100 MCG/2ML IJ SOLN: 25.0000 ug | INTRAMUSCULAR | Status: DC | PRN

## 2016-09-07 MED ORDER — BUPIVACAINE HCL (PF) 0.25 % IJ SOLN
INTRAMUSCULAR | Status: AC
  Filled 2016-09-07: qty 30

## 2016-09-07 MED ORDER — DEXAMETHASONE SODIUM PHOSPHATE 10 MG/ML IJ SOLN
INTRAMUSCULAR | Status: DC | PRN
Start: 2016-09-07 — End: 2016-09-07
  Administered 2016-09-07: 10 mg via INTRAVENOUS

## 2016-09-07 MED ORDER — ONDANSETRON HCL 4 MG/2ML IJ SOLN: 4.0000 mg | Freq: Once | INTRAMUSCULAR | Status: DC | PRN

## 2016-09-07 MED ORDER — NEOMYCIN-POLYMYXIN B GU 40-200000 IR SOLN
Status: AC
  Filled 2016-09-07: qty 2

## 2016-09-07 MED ORDER — HYDROCODONE-ACETAMINOPHEN 5-325 MG PO TABS: 1.0000 | ORAL_TABLET | Freq: Four times a day (QID) | ORAL | 0 refills | Status: DC | PRN

## 2016-09-07 MED ORDER — ONDANSETRON HCL 4 MG PO TABS: 4.0000 mg | ORAL_TABLET | Freq: Three times a day (TID) | ORAL | 0 refills | Status: DC | PRN

## 2016-09-07 MED ORDER — PROPOFOL 10 MG/ML IV BOLUS
INTRAVENOUS | Status: AC
  Filled 2016-09-07: qty 20

## 2016-09-07 SURGICAL SUPPLY — 46 items
BANDAGE ELASTIC 4 LF NS (GAUZE/BANDAGES/DRESSINGS) ×4 IMPLANT
BLADE SURG MINI STRL (BLADE) ×2 IMPLANT
BNDG ESMARK 4X12 TAN STRL LF (GAUZE/BANDAGES/DRESSINGS) ×2 IMPLANT
CANISTER SUCT 1200ML W/VALVE (MISCELLANEOUS) ×2 IMPLANT
CORD BIP STRL DISP 12FT (MISCELLANEOUS) ×2 IMPLANT
CUFF TOURN 18 STER (MISCELLANEOUS) ×2 IMPLANT
DRAPE IMP U-DRAPE 54X76 (DRAPES) ×2 IMPLANT
DRAPE SURG 17X11 SM STRL (DRAPES) ×2 IMPLANT
DURAPREP 26ML APPLICATOR (WOUND CARE) ×4 IMPLANT
ELECT REM PT RETURN 9FT ADLT (ELECTROSURGICAL) ×2
ELECTRODE REM PT RTRN 9FT ADLT (ELECTROSURGICAL) ×1 IMPLANT
FORCEPS JEWEL BIP 4-3/4 STR (INSTRUMENTS) ×2 IMPLANT
GAUZE FLUFF 18X24 1PLY STRL (GAUZE/BANDAGES/DRESSINGS) ×2 IMPLANT
GAUZE PETRO XEROFOAM 1X8 (MISCELLANEOUS) ×2 IMPLANT
GAUZE SPONGE 4X4 12PLY STRL (GAUZE/BANDAGES/DRESSINGS) ×2 IMPLANT
GLOVE BIO SURGEON STRL SZ 6.5 (GLOVE) ×2 IMPLANT
GLOVE BIOGEL PI IND STRL 7.0 (GLOVE) ×2 IMPLANT
GLOVE BIOGEL PI IND STRL 9 (GLOVE) ×1 IMPLANT
GLOVE BIOGEL PI INDICATOR 7.0 (GLOVE) ×2
GLOVE BIOGEL PI INDICATOR 9 (GLOVE) ×1
GLOVE SURG 9.0 ORTHO LTXF (GLOVE) ×4 IMPLANT
GLOVE SURG SYN 6.5 ES PF (GLOVE) ×4 IMPLANT
GOWN STRL REUS TWL 2XL XL LVL4 (GOWN DISPOSABLE) ×2 IMPLANT
GOWN STRL REUS W/ TWL LRG LVL3 (GOWN DISPOSABLE) ×2 IMPLANT
GOWN STRL REUS W/TWL LRG LVL3 (GOWN DISPOSABLE) ×2
KIT RM TURNOVER STRD PROC AR (KITS) ×2 IMPLANT
NEEDLE FILTER BLUNT 18X 1/2SAF (NEEDLE) ×1
NEEDLE FILTER BLUNT 18X1 1/2 (NEEDLE) ×1 IMPLANT
NS IRRIG 500ML POUR BTL (IV SOLUTION) ×2 IMPLANT
PACK EXTREMITY ARMC (MISCELLANEOUS) ×2 IMPLANT
PAD CAST CTTN 4X4 STRL (SOFTGOODS) ×2 IMPLANT
PADDING CAST 4IN STRL (MISCELLANEOUS) ×3
PADDING CAST BLEND 4X4 STRL (MISCELLANEOUS) ×3 IMPLANT
PADDING CAST COTTON 4X4 STRL (SOFTGOODS) ×2
SLING ARM LRG DEEP (SOFTGOODS) ×2 IMPLANT
SLING ARM M TX990204 (SOFTGOODS) IMPLANT
SPLINT CAST 1 STEP 3X12 (MISCELLANEOUS) ×2 IMPLANT
STOCKINETTE STRL 4IN 9604848 (GAUZE/BANDAGES/DRESSINGS) ×2 IMPLANT
STRIP CLOSURE SKIN 1/2X4 (GAUZE/BANDAGES/DRESSINGS) ×2 IMPLANT
SUT ETHILON 3-0 FS-10 30 BLK (SUTURE)
SUT ETHILON 4-0 (SUTURE)
SUT ETHILON 4-0 FS2 18XMFL BLK (SUTURE)
SUT ETHILON 5-0 FS-2 18 BLK (SUTURE) ×2 IMPLANT
SUTURE EHLN 3-0 FS-10 30 BLK (SUTURE) IMPLANT
SUTURE ETHLN 4-0 FS2 18XMF BLK (SUTURE) IMPLANT
SYR 3ML LL SCALE MARK (SYRINGE) ×2 IMPLANT

## 2016-09-07 NOTE — Op Note (Signed)
  09/07/2016  10:54 AM  PATIENT:  Janet Rice    PRE-OPERATIVE DIAGNOSIS:  G56.01 Carpal tunnel syndrome, right upper limb  POST-OPERATIVE DIAGNOSIS:  Same  PROCEDURE:  RIGHT OPEN CARPAL TUNNEL RELEASE  SURGEON:  Thornton Park, MD  ANESTHESIA:   General  PREOPERATIVE INDICATIONS:  Janet Rice is a  71 y.o. female with a diagnosis of G56.01 Carpal tunnel syndrome, right upper limb who failed conservative measures and elected for surgical management.    I discussed the risks and benefits of surgery. The risks include but are not limited to infection, bleeding, nerve or blood vessel injury, joint stiffness or loss of motion, persistent pain, weakness, recurrence of symptoms and the need for further surgery. Medical risks include but are not limited to DVT and pulmonary embolism, myocardial infarction, stroke, pneumonia, respiratory failure and death. Patient understood these risks and wished to proceed.   OPERATIVE FINDINGS: Significant median nerve compression at the carpal tunnel, right upper extremity  OPERATIVE PROCEDURE: Patient was met in the preoperative area. I signed the right wrist with my initials and the word yes according the hospital's correct site of surgery protocol. The pre-op H&P was performed in the preoperative area.. I answered all the patient's questions. The patient was then brought to the operating room where she underwent general with LMA.  She was positioned supine on the operative table. The right arm was placed on a hand table. A tourniquet was applied to the right upper extremity. She was prepped and draped in a sterile fashion. A timeout was performed to verify the patient's name, date of birth, medical record number, correct site of surgery correct procedure to be performed. The time out was also used to confirm the patient received antibiotics that all necessary instruments were available in the room. The right upper extremity was then exsanguinated  with an Esmarch and the tourniquet inflated to 250 mmHg.   An incision following the palmar crease was made. This was made in line with the web space between the middle and ring fingers and the distal extent of the incision was where it intersected Kaplan's cardinal line. Bleeding vessels were cauterized with a bipolar.  The subcutaneous tissue was carefully dissected out with a Metzenbaum scissor and pickup until the palmar fascia was encountered. The distal extent of the transverse carpal ligament was then identified. A Freer elevator was placed under the transverse carpal ligament running distally to proximally. A micro-Beaver blade was then used to incise the transverse carpal ligament taking care to avoid injury to any neurovascular structures. The carpal tunnel was found to be extremely constricted. There was significant compression on the median nerve. The transverse carpal ligament was completely released. The nerve was visualized in its entirety and the carpal tunnel. The wound was copiously irrigated. The skin was then approximated with 5-0 nylon. Xeroform was placed over the incision. A dry sterile dressing was applied along with a volar fiberglass splint. Patient was overwrapped with an Ace wrap. The tourniquet was deflated at 33 minutes.  Sling was placed on the right upper extremity. She was extubated and brought to the PACU in stable condition. I was scrubbed and present the entire case and all sharp and instrument counts were correct at the conclusion the case.     Janet Gaul, MD

## 2016-09-07 NOTE — Anesthesia Postprocedure Evaluation (Signed)
Anesthesia Post Note  Patient: Janet Rice  Procedure(s) Performed: Procedure(s) (LRB): CARPAL TUNNEL RELEASE (Right)  Patient location during evaluation: PACU Anesthesia Type: General Level of consciousness: awake and alert Pain management: pain level controlled Vital Signs Assessment: post-procedure vital signs reviewed and stable Respiratory status: spontaneous breathing and respiratory function stable Cardiovascular status: stable Anesthetic complications: no     Last Vitals:  Vitals:   09/07/16 1128 09/07/16 1141  BP: 118/74 127/75  Pulse: 68 (!) 54  Resp: 13 15  Temp: 36.4 C 37.2 C    Last Pain:  Vitals:   09/07/16 1141  TempSrc: Temporal  PainSc: 4                  KEPHART,WILLIAM K

## 2016-09-07 NOTE — Anesthesia Procedure Notes (Signed)
Procedure Name: LMA Insertion Performed by: Rosaria Ferries, Avalene Sealy Pre-anesthesia Checklist: Patient identified, Emergency Drugs available, Suction available and Patient being monitored Patient Re-evaluated:Patient Re-evaluated prior to inductionOxygen Delivery Method: Circle system utilized Preoxygenation: Pre-oxygenation with 100% oxygen Intubation Type: IV induction LMA: LMA inserted LMA Size: 3.5 Number of attempts: 1 Tube secured with: Tape Dental Injury: Teeth and Oropharynx as per pre-operative assessment

## 2016-09-07 NOTE — Anesthesia Preprocedure Evaluation (Signed)
Anesthesia Evaluation  Patient identified by MRN, date of birth, ID band Patient awake    Reviewed: Allergy & Precautions, NPO status , Patient's Chart, lab work & pertinent test results  History of Anesthesia Complications Negative for: history of anesthetic complications  Airway Mallampati: III       Dental   Pulmonary asthma , former smoker,           Cardiovascular negative cardio ROS       Neuro/Psych    GI/Hepatic Neg liver ROS, GERD  Medicated and Controlled,  Endo/Other  Hypothyroidism   Renal/GU negative Renal ROS     Musculoskeletal   Abdominal   Peds  Hematology  (+) anemia ,   Anesthesia Other Findings   Reproductive/Obstetrics                             Anesthesia Physical Anesthesia Plan  ASA: II  Anesthesia Plan: General   Post-op Pain Management:    Induction: Intravenous  Airway Management Planned: LMA  Additional Equipment:   Intra-op Plan:   Post-operative Plan:   Informed Consent: I have reviewed the patients History and Physical, chart, labs and discussed the procedure including the risks, benefits and alternatives for the proposed anesthesia with the patient or authorized representative who has indicated his/her understanding and acceptance.     Plan Discussed with:   Anesthesia Plan Comments:         Anesthesia Quick Evaluation

## 2016-09-07 NOTE — H&P (Signed)
PREOPERATIVE H&P  Chief Complaint: G56.01 Carpal tunnel syndrome, right upper limb  HPI: Janet Rice is a 71 y.o. female who presents for preoperative history and physical with a diagnosis of G56.01 Carpal tunnel syndrome, right upper limb. Symptoms are rated as moderate to severe, and have been worsening.  This is significantly impairing activities of daily living.  She has elected for surgical management.   Past Medical History:  Diagnosis Date  . Adenomatous colon polyp   . Arthritis   . Childhood asthma    AS A CHILD  . Chronic heartburn    On omeprazole  . Complication of anesthesia    HARD TO WAKE UP  . External hemorrhoids   . GERD (gastroesophageal reflux disease)   . Heme positive stool   . Hyperlipidemia   . Hypothyroidism   . Iron deficiency anemia   . Metastatic melanoma ([place])    Followed by Dr Oliva Bustard, previous chemo, no recurrence, 2008?   Past Surgical History:  Procedure Laterality Date  . ABDOMINAL HYSTERECTOMY  1984  . BACK SURGERY  03/2016   LUMBAR  . CHOLECYSTECTOMY  1990  . COLONOSCOPY  08/2006   Four sessile polyps found and removed in sigmoid colon at splenic flexure and ascending colon. 7 mm in size. Another removed from transverse colon 4 mm. Path Report - showed tubular adenoma and hyperplastic polyp. Advised to repeat in 3.5 years (02/2010)  . COLONOSCOPY  3.2.2011   8 mm polyp in sigmoid colon, removed, 4 mm polyp in descending colon, removed. Internal hemorrhoids  . ESOPHAGOGASTRODUODENOSCOPY  3.2.2011   Large hiatia hernia, esophagus normal, mulitple small sessile polyps w/no stigmata of recent bleeding found. Mildy erythermatous mucosa w/no bleeding found in gatric antrum. Normal duodenum. 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 PATH - no active inflammation, antral mucosa w/mild foveolar hyperplasia  . Lymph Node Removal  12/2005   Left inguinal lymph node dissection  . MELANOMA EXCISION  1993   thigh  . ORIF ANKLE FRACTURE  Right    Dr. Mack Guise   Social History   Social History  . Marital status: Married    Spouse name: N/A  . Number of children: 2  . Years of education: N/A   Occupational History  . 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 Smoking status: Former Smoker    Packs/day: 0.50    Years: 8.00    Types: Cigarettes    Quit date: 07/12/1978  . Smokeless tobacco: Never Used  . Alcohol use No  . Drug use: No  . Sexual activity: Not Currently   Other Topics Concern  . None   Social History Narrative  . None   Family History  Problem Relation Age of Onset  . Aneurysm Mother   . Heart disease Mother   . Colon polyps Father   . Diabetes Father   . Leukemia Maternal Grandfather   . Skin cancer Paternal Grandfather   . Diabetes Other   . Colon polyps Other   . Colon cancer Neg Hx   . Breast cancer Neg Hx    Allergies  Allergen Reactions  . Sulfa Antibiotics Rash   Prior to Admission medications   Medication Sig Start Date End Date Taking? Authorizing Provider  acetaminophen (TYLENOL) 500 MG tablet Take 500-1,000 mg by mouth every 6 (six) hours as needed (for active day to prevent pain.).   Yes Historical Provider, MD  b complex vitamins tablet Take 1 tablet by mouth  daily with lunch.   Yes Historical Provider, MD  Calcium Carb-Cholecalciferol (CALCIUM 600+D) 600-800 MG-UNIT TABS Take 1 tablet by mouth daily with lunch.   Yes Historical Provider, MD  celecoxib (CELEBREX) 200 MG capsule Take 200 mg by mouth every morning. 08/20/16  Yes Historical Provider, MD  citalopram (CELEXA) 20 MG tablet Take 1 tablet (20 mg total) by mouth daily. Patient taking differently: Take 20 mg by mouth at bedtime.  03/17/16  Yes [name], DO  Coenzyme Q10 (COQ10) 50 MG CAPS Take 50 mg by mouth daily with lunch.   Yes Historical Provider, MD  Ferrous Sulfate Dried (SLOW RELEASE IRON) 45 MG TBCR Take 45 mg by mouth daily with lunch.   Yes Historical Provider, MD  levothyroxine (SYNTHROID,  LEVOTHROID) 125 MCG tablet Take 1 tablet (125 mcg total) by mouth daily before breakfast. 01/26/16  Yes Jackolyn Confer, MD  loperamide (IMODIUM) 2 MG capsule Take 2 mg by mouth every morning.   Yes Historical Provider, MD  Multiple Vitamin (MULTIVITAMIN WITH MINERALS) TABS tablet Take 1 tablet by mouth daily with lunch.   Yes Historical Provider, MD  Multiple Vitamins-Minerals (EYE VITAMINS & MINERALS) TABS Take 2 tablets by mouth daily.    Yes Historical Provider, MD  omeprazole (PRILOSEC) 20 MG capsule Take 1 capsule (20 mg total) by mouth daily. Patient taking differently: Take 20 mg by mouth at bedtime.  01/26/16  Yes Jackolyn Confer, MD  Polyethyl Glycol-Propyl Glycol (SYSTANE ULTRA OP) Place 1-2 drops into both eyes 3 (three) times daily as needed (for dry eye).   Yes Historical Provider, MD  pravastatin (PRAVACHOL) 40 MG tablet Take 0.5 tablets (20 mg total) by mouth daily. Patient taking differently: Take 20 mg by mouth at bedtime.  01/26/16  Yes Jackolyn Confer, MD  Probiotic Product (PROBIOTIC PO) Take 1 capsule by mouth daily with breakfast.   Yes Historical Provider, MD  Turmeric 450 MG CAPS Take 450 mg by mouth daily with lunch.   Yes Historical Provider, MD  amoxicillin-clavulanate (AUGMENTIN) 500-125 MG tablet Take 1 tablet (500 mg total) by mouth 2 (two) times daily. Patient taking differently: Take 1 tablet by mouth 2 (two) times daily.  09/06/16   [name], DO     Positive ROS: All other systems have been reviewed and were otherwise negative with the exception of those mentioned in the HPI and as above.  Physical Exam: General: Alert, no acute distress Cardiovascular: Regular rate and rhythm, no murmurs rubs or gallops.  No pedal edema Respiratory: Clear to auscultation bilaterally, no wheezes rales or rhonchi. No cyanosis, no use of accessory musculature GI: No organomegaly, abdomen is soft and non-tender nondistended with positive bowel sounds. Skin: Skin intact, no  lesions within the operative field. Neurologic: Sensation intact distally Psychiatric: Patient is competent for consent with normal mood and affect Lymphatic: Nocervical lymphadenopathy  MUSCULOSKELETAL: Right hand/wrist:  Skin is intact. There is no erythema or ecchymosis or swelling.  Patient has diminished sensation to light touch in the thumb, index, middle and half of the ring finger. Her fingers well-perfused. She has mild thenar atrophy. She has no significant weakness of the right hand on exam today. l  Assessment: G56.01 Carpal tunnel syndrome, right upper limb  Plan: Plan for Procedure(s): RIGHT CARPAL TUNNEL RELEASE  Patient is undergone a left open carpal tunnel release by me in the past. She did well with this procedure. She is aware of what the procedure entails as well as  the postoperative course. I saw her in the preoperative area today with her husband. I answered all their questions.  I discussed the risks and benefits of surgery. The risks include but are not limited to infection, bleeding requiring blood transfusion, nerve or blood vessel injury, joint stiffness or loss of motion, persistent numbness or weakness, recurrence of symptoms and the need for further surgery. Medical risks include but are not limited to DVT and pulmonary embolism, myocardial infarction, stroke, pneumonia, respiratory failure and death. Patient understood these risks and wished to proceed.   Thornton Park, MD   09/07/2016 9:13 AM

## 2016-09-07 NOTE — Discharge Instructions (Signed)

## 2016-09-07 NOTE — Anesthesia Post-op Follow-up Note (Cosign Needed)
Anesthesia QCDR form completed.        

## 2016-09-07 NOTE — Transfer of Care (Signed)
Immediate Anesthesia Transfer of Care Note  Patient: Janet Rice  Procedure(s) Performed: Procedure(s): CARPAL TUNNEL RELEASE (Right)  Patient Location: PACU  Anesthesia Type:General  Level of Consciousness: awake, alert , oriented and patient cooperative  Airway & Oxygen Therapy: Patient Spontanous Breathing and Patient connected to nasal cannula oxygen  Post-op Assessment: Report given to RN and Post -op Vital signs reviewed and stable  Post vital signs: Reviewed and stable  Last Vitals:  Vitals:   09/07/16 0849 09/07/16 1048  BP: 137/74 138/75  Pulse: 95 (!) 106  Resp: 20 14  Temp: 36.9 C 36.4 C    Last Pain:  Vitals:   09/07/16 1048  TempSrc:   PainSc: 0-No pain         Complications: No apparent anesthesia complications

## 2016-09-10 ENCOUNTER — Encounter: Payer: Self-pay | Admitting: Oncology

## 2016-09-10 DIAGNOSIS — D509 Iron deficiency anemia, unspecified: Secondary | ICD-10-CM | POA: Diagnosis not present

## 2016-09-11 LAB — CBC AND DIFFERENTIAL
HCT: 36 % (ref 36–46)
Hemoglobin: 11.3 g/dL — AB (ref 12.0–16.0)
Neutrophils Absolute: 5 /uL
Platelets: 152 10*3/uL (ref 150–399)
WBC: 6.7 10^3/mL

## 2016-09-13 ENCOUNTER — Other Ambulatory Visit: Payer: Self-pay | Admitting: Oncology

## 2016-09-14 ENCOUNTER — Inpatient Hospital Stay: Payer: Medicare Other | Attending: Oncology | Admitting: Oncology

## 2016-09-14 ENCOUNTER — Inpatient Hospital Stay: Payer: Medicare Other

## 2016-09-14 VITALS — BP 122/63 | HR 73 | Temp 98.3°F | Resp 18 | Wt 206.8 lb

## 2016-09-14 DIAGNOSIS — E785 Hyperlipidemia, unspecified: Secondary | ICD-10-CM | POA: Diagnosis not present

## 2016-09-14 DIAGNOSIS — Z79899 Other long term (current) drug therapy: Secondary | ICD-10-CM | POA: Diagnosis not present

## 2016-09-14 DIAGNOSIS — Z87891 Personal history of nicotine dependence: Secondary | ICD-10-CM | POA: Diagnosis not present

## 2016-09-14 DIAGNOSIS — Z85820 Personal history of malignant melanoma of skin: Secondary | ICD-10-CM | POA: Diagnosis not present

## 2016-09-14 DIAGNOSIS — D509 Iron deficiency anemia, unspecified: Secondary | ICD-10-CM | POA: Insufficient documentation

## 2016-09-14 DIAGNOSIS — K219 Gastro-esophageal reflux disease without esophagitis: Secondary | ICD-10-CM | POA: Insufficient documentation

## 2016-09-14 DIAGNOSIS — K644 Residual hemorrhoidal skin tags: Secondary | ICD-10-CM | POA: Diagnosis not present

## 2016-09-14 DIAGNOSIS — E039 Hypothyroidism, unspecified: Secondary | ICD-10-CM | POA: Insufficient documentation

## 2016-09-14 DIAGNOSIS — D508 Other iron deficiency anemias: Secondary | ICD-10-CM

## 2016-09-14 MED ORDER — SODIUM CHLORIDE 0.9 % IV SOLN
Freq: Once | INTRAVENOUS | Status: AC
  Administered 2016-09-14: 14:00:00 via INTRAVENOUS
  Filled 2016-09-14: qty 1000

## 2016-09-14 MED ORDER — SODIUM CHLORIDE 0.9 % IV SOLN
510.0000 mg | Freq: Once | INTRAVENOUS | Status: AC
  Administered 2016-09-14: 510 mg via INTRAVENOUS
  Filled 2016-09-14: qty 17

## 2016-09-14 NOTE — Progress Notes (Signed)
[place]  Telephone:(336) [phone]  Fax:([phone]) [phone] DOB: July 28, 1945  MR#: [id number]  [id number]  Patient Care Team: [name], DO as PCP - General (Family Medicine)  CHIEF COMPLAINT: Iron deficiency anemia.  INTERVAL HISTORY:  Patient returns to clinic for further evaluation and consideration of additional IV Feraheme. She continues to have chronic mild weakness and fatigue, but otherwise feels well. She has no neurologic complaints. She denies any recent fevers or illnesses. She has a good appetite and denies weight loss. She denies any chest pain or shortness of breath. She denies any nausea, vomiting, constipation, or diarrhea. She has no melena or hematochezia. She has no urinary complaints. Patient feels at her baseline and offers no specific complaints today.   REVIEW OF SYSTEMS:   Review of Systems  Constitutional: Negative.  Negative for fever, malaise/fatigue and weight loss.  HENT: Negative.   Eyes: Negative.   Respiratory: Negative.  Negative for cough and shortness of breath.   Cardiovascular: Negative for chest pain and leg swelling.  Gastrointestinal: Negative.  Negative for abdominal pain, blood in stool and melena.  Genitourinary: Negative.   Musculoskeletal: Positive for back pain.  Skin: Negative.   Neurological: Negative.  Negative for weakness.  Endo/Heme/Allergies: Negative.   Psychiatric/Behavioral: Negative.  The patient is not nervous/anxious.     As per HPI. Otherwise, a complete review of systems is negative.  ONCOLOGY HISTORY:  No history exists.    PAST MEDICAL HISTORY: Past Medical History:  Diagnosis Date  . Adenomatous colon polyp   . Arthritis   . Childhood asthma    AS A CHILD  . Chronic heartburn    On omeprazole  . Complication of anesthesia    HARD TO WAKE UP  . External hemorrhoids   . GERD (gastroesophageal reflux disease)   . Heme positive stool   . Hyperlipidemia   .  Hypothyroidism   . Iron deficiency anemia   . Metastatic melanoma ([place])    Followed by Dr Oliva Bustard, previous chemo, no recurrence, 2008?    PAST SURGICAL HISTORY: Past Surgical History:  Procedure Laterality Date  . ABDOMINAL HYSTERECTOMY  1984  . BACK SURGERY  03/2016   LUMBAR  . CARPAL TUNNEL RELEASE Right 09/07/2016   Procedure: CARPAL TUNNEL RELEASE;  Surgeon: Thornton Park, MD;  Location: [facility] ORS;  Service: Orthopedics;  Laterality: Right;  . CHOLECYSTECTOMY  1990  . COLONOSCOPY  08/2006   Four sessile polyps found and removed in sigmoid colon at splenic flexure and ascending colon. 7 mm in size. Another removed from transverse colon 4 mm. Path Report - showed tubular adenoma and hyperplastic polyp. Advised to repeat in 3.5 years (02/2010)  . COLONOSCOPY  3.2.2011   8 mm polyp in sigmoid colon, removed, 4 mm polyp in descending colon, removed. Internal hemorrhoids  . ESOPHAGOGASTRODUODENOSCOPY  3.2.2011   Large hiatia hernia, esophagus normal, mulitple small sessile polyps w/no stigmata of recent bleeding found. Mildy erythermatous mucosa w/no bleeding found in gatric antrum. Normal duodenum. 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 PATH - no active inflammation, antral mucosa w/mild foveolar hyperplasia  . Lymph Node Removal  12/2005   Left inguinal lymph node dissection  . MELANOMA EXCISION  1993   thigh  . ORIF ANKLE FRACTURE Right    Dr. Mack Guise    FAMILY HISTORY Family History  Problem Relation Age of Onset  . Aneurysm Mother   . Heart disease Mother   .  Colon polyps Father   . Diabetes Father   . Leukemia Maternal Grandfather   . Skin cancer Paternal Grandfather   . Diabetes Other   . Colon polyps Other   . Colon cancer Neg Hx   . Breast cancer Neg Hx     GYNECOLOGIC HISTORY:  No LMP recorded. Patient has had a hysterectomy.     ADVANCED DIRECTIVES:    HEALTH MAINTENANCE: Social History  Substance Use Topics  . Smoking status: Former Smoker      Packs/day: 0.50    Years: 8.00    Types: Cigarettes    Quit date: 07/12/1978  . Smokeless tobacco: Never Used  . Alcohol use No    Allergies  Allergen Reactions  . Sulfa Antibiotics Rash    Current Outpatient Prescriptions  Medication Sig Dispense Refill  . acetaminophen (TYLENOL) 500 MG tablet Take 500-1,000 mg by mouth every 6 (six) hours as needed (for active day to prevent pain.).    Marland Kitchen b complex vitamins tablet Take 1 tablet by mouth daily with lunch.    . Calcium Carb-Cholecalciferol (CALCIUM 600+D) 600-800 MG-UNIT TABS Take 1 tablet by mouth daily with lunch.    . celecoxib (CELEBREX) 200 MG capsule Take 200 mg by mouth every morning.    . citalopram (CELEXA) 20 MG tablet Take 1 tablet (20 mg total) by mouth daily. (Patient taking differently: Take 20 mg by mouth at bedtime. ) 90 tablet 2  . Coenzyme Q10 (COQ10) 50 MG CAPS Take 50 mg by mouth daily with lunch.    . Ferrous Sulfate Dried (SLOW RELEASE IRON) 45 MG TBCR Take 45 mg by mouth daily with lunch.    . levothyroxine (SYNTHROID, LEVOTHROID) 125 MCG tablet Take 1 tablet (125 mcg total) by mouth daily before breakfast. 90 tablet 3  . loperamide (IMODIUM) 2 MG capsule Take 2 mg by mouth every morning.    . Multiple Vitamin (MULTIVITAMIN WITH MINERALS) TABS tablet Take 1 tablet by mouth daily with lunch.    . Multiple Vitamins-Minerals (EYE VITAMINS & MINERALS) TABS Take 2 tablets by mouth daily.     Marland Kitchen omeprazole (PRILOSEC) 20 MG capsule Take 1 capsule (20 mg total) by mouth daily. (Patient taking differently: Take 20 mg by mouth at bedtime. ) 90 capsule 3  . Polyethyl Glycol-Propyl Glycol (SYSTANE ULTRA OP) Place 1-2 drops into both eyes 3 (three) times daily as needed (for dry eye).    . pravastatin (PRAVACHOL) 40 MG tablet Take 0.5 tablets (20 mg total) by mouth daily. (Patient taking differently: Take 20 mg by mouth at bedtime. ) 45 tablet 3  . Probiotic Product (PROBIOTIC PO) Take 1 capsule by mouth daily with breakfast.     . Turmeric 450 MG CAPS Take 450 mg by mouth daily with lunch.    Marland Kitchen amoxicillin-clavulanate (AUGMENTIN) 500-125 MG tablet Take 1 tablet (500 mg total) by mouth 2 (two) times daily. (Patient not taking: Reported on 09/14/2016) 6 tablet 0  . HYDROcodone-acetaminophen (NORCO) 5-325 MG tablet Take 1-2 tablets by mouth every 6 (six) hours as needed for moderate pain. MAXIMUM TOTAL ACETAMINOPHEN DOSE IS 4000 MG PER DAY (Patient not taking: Reported on 09/14/2016) 40 tablet 0  . ondansetron (ZOFRAN) 4 MG tablet Take 1 tablet (4 mg total) by mouth every 8 (eight) hours as needed for nausea or vomiting. (Patient not taking: Reported on 09/14/2016) 30 tablet 0   No current facility-administered medications for this visit.     OBJECTIVE: BP 122/63 (BP Location: Left  Arm, Patient Position: Sitting)   Pulse 73   Temp 98.3 F (36.8 C) (Tympanic)   Resp 18   Wt 206 lb 12.7 oz (93.8 kg)   BMI 39.07 kg/m    Body mass index is 39.07 kg/m.    ECOG FS:0 - Asymptomatic  General: Well-developed, well-nourished, no acute distress. Eyes: Pink conjunctiva, anicteric sclera. Lungs: Clear to auscultation bilaterally. Heart: Regular rate and rhythm. No rubs, murmurs, or gallops. Abdomen: Soft, nontender, nondistended. No organomegaly noted, normoactive bowel sounds. Musculoskeletal: No edema, cyanosis, or clubbing. Neuro: Alert, answering all questions appropriately. Cranial nerves grossly intact. Skin: No rashes or petechiae noted. Psych: Normal affect.   LAB RESULTS:  September 11, 2016: WBC 6.7, hemoglobin 11.3, MCV 78, platelet 152. Total iron 22, iron saturation 7%, ferritin 19.   STUDIES: No results found.  ASSESSMENT & PLAN:    1. Iron deficiency anemia: Patient reportedly had a normal colonoscopy in March 2016. Although patient's hemoglobin is greater than 11.0, her iron stores are significantly reduced. Proceed with 510 mg IV Feraheme today. Return to clinic in 1 week for a second infusion. Patient will  then return to clinic in 3 months with repeat laboratory work, further evaluation, and consideration of additional IV iron. Patient gets her labwork completed at [facility].  2. History of melanoma: Melanoma of left thigh status post resection in 1993, exact stage is not known, metastatic to inguinal lymph node. No evidence of recurrent disease. 3. Back Pain: Patient had surgery in September 2017.   Patient expressed understanding and was in agreement with this plan. She also understands that She can call clinic at any time with any questions, concerns, or complaints.    Lloyd Huger, MD   09/18/2016 7:27 PM

## 2016-09-21 ENCOUNTER — Inpatient Hospital Stay: Payer: Medicare Other

## 2016-09-21 VITALS — BP 125/79 | HR 77 | Temp 99.1°F | Resp 18

## 2016-09-21 DIAGNOSIS — E039 Hypothyroidism, unspecified: Secondary | ICD-10-CM | POA: Diagnosis not present

## 2016-09-21 DIAGNOSIS — D508 Other iron deficiency anemias: Secondary | ICD-10-CM

## 2016-09-21 DIAGNOSIS — E785 Hyperlipidemia, unspecified: Secondary | ICD-10-CM | POA: Diagnosis not present

## 2016-09-21 DIAGNOSIS — D509 Iron deficiency anemia, unspecified: Secondary | ICD-10-CM | POA: Diagnosis not present

## 2016-09-21 DIAGNOSIS — Z79899 Other long term (current) drug therapy: Secondary | ICD-10-CM | POA: Diagnosis not present

## 2016-09-21 DIAGNOSIS — Z85820 Personal history of malignant melanoma of skin: Secondary | ICD-10-CM | POA: Diagnosis not present

## 2016-09-21 DIAGNOSIS — K219 Gastro-esophageal reflux disease without esophagitis: Secondary | ICD-10-CM | POA: Diagnosis not present

## 2016-09-21 MED ORDER — SODIUM CHLORIDE 0.9 % IV SOLN
510.0000 mg | Freq: Once | INTRAVENOUS | Status: AC
  Administered 2016-09-21: 510 mg via INTRAVENOUS
  Filled 2016-09-21: qty 17

## 2016-09-21 MED ORDER — SODIUM CHLORIDE 0.9 % IV SOLN
Freq: Once | INTRAVENOUS | Status: AC
  Administered 2016-09-21: 14:00:00 via INTRAVENOUS
  Filled 2016-09-21: qty 1000

## 2016-09-23 ENCOUNTER — Telehealth: Payer: Self-pay | Admitting: Family Medicine

## 2016-09-23 NOTE — Telephone Encounter (Signed)
Pt called and wanted to know if Dr. Lacinda Axon would approve her hip surgery or does he need more blood work. Please advise, thank you!  Call pt @ [phone]

## 2016-09-23 NOTE — Telephone Encounter (Signed)
She will need additional labs based on criteria from [facility].

## 2016-09-23 NOTE — Telephone Encounter (Signed)
Pt was called and told we could not see her updated labs from Dr.Finnegan's office we would need a copy. She was told [facility] had not sent over a new clearance form. Pt stated she'd call [facility]. She has a company of new labs.

## 2016-10-01 ENCOUNTER — Telehealth: Payer: Self-pay | Admitting: Family Medicine

## 2016-10-01 NOTE — Telephone Encounter (Signed)
[facility] was called and stated information was faxed to [place]. Pt was told that information was at Dr.Finnegan's office.

## 2016-10-01 NOTE — Telephone Encounter (Signed)
Pt called wanting to follow up if Dr Kathee Delton office sent over her blood work and [employer] also was supposed to send approval for hip replacement surgery?  Call pt @ [phone]. Thank you!

## 2016-10-06 ENCOUNTER — Telehealth: Payer: Self-pay | Admitting: Family Medicine

## 2016-10-06 NOTE — Telephone Encounter (Signed)
[place] from the [facility] called and needed some clarification on what needs to be done for patient. Please advise, thank you!  Call [place] @ [phone]

## 2016-10-06 NOTE — Telephone Encounter (Signed)
Was pt form filled out?

## 2016-10-06 NOTE — Telephone Encounter (Signed)
Jerri from [facility] called and needs the last set of labs. Please advise, thank you!  Fax @ [phone]

## 2016-10-06 NOTE — Telephone Encounter (Signed)
I filled out form. Ashleigh, please fax labs.

## 2016-10-07 ENCOUNTER — Other Ambulatory Visit: Payer: Self-pay | Admitting: Family Medicine

## 2016-10-07 NOTE — Telephone Encounter (Signed)
Information was completed and labs were also faxed.

## 2016-10-07 NOTE — Telephone Encounter (Signed)
sherri was called and told that labs were faxed over in the form of office notes and hemoglobin was circled on the last page. A voicemail was left stating this. I will fax over again the labs.

## 2016-10-11 DIAGNOSIS — H538 Other visual disturbances: Secondary | ICD-10-CM | POA: Diagnosis not present

## 2016-10-11 DIAGNOSIS — H35371 Puckering of macula, right eye: Secondary | ICD-10-CM | POA: Diagnosis not present

## 2016-10-11 DIAGNOSIS — H2513 Age-related nuclear cataract, bilateral: Secondary | ICD-10-CM | POA: Diagnosis not present

## 2016-10-19 ENCOUNTER — Other Ambulatory Visit: Payer: Self-pay | Admitting: Orthopedic Surgery

## 2016-11-03 ENCOUNTER — Telehealth: Payer: Self-pay | Admitting: Family Medicine

## 2016-11-03 ENCOUNTER — Encounter
Admission: RE | Admit: 2016-11-03 | Discharge: 2016-11-03 | Disposition: A | Discharge status: Home / Self Care | Payer: Medicare Other | Source: Ambulatory Visit | Attending: Orthopedic Surgery | Admitting: Orthopedic Surgery

## 2016-11-03 DIAGNOSIS — Z01812 Encounter for preprocedural laboratory examination: Secondary | ICD-10-CM | POA: Diagnosis not present

## 2016-11-03 DIAGNOSIS — M1611 Unilateral primary osteoarthritis, right hip: Secondary | ICD-10-CM | POA: Diagnosis not present

## 2016-11-03 HISTORY — DX: Diarrhea, unspecified: R19.7

## 2016-11-03 LAB — COMPREHENSIVE METABOLIC PANEL
ALT: 34 U/L (ref 14–54)
AST: 32 U/L (ref 15–41)
Albumin: 4.2 g/dL (ref 3.5–5.0)
Alkaline Phosphatase: 116 U/L (ref 38–126)
Anion gap: 10 (ref 5–15)
BUN: 14 mg/dL (ref 6–20)
CO2: 31 mmol/L (ref 22–32)
Calcium: 9.4 mg/dL (ref 8.9–10.3)
Chloride: 97 mmol/L — ABNORMAL LOW (ref 101–111)
Creatinine, Ser: 0.9 mg/dL (ref 0.44–1.00)
GFR calc Af Amer: >60 mL/min (ref >60)
GFR calc non Af Amer: >60 mL/min (ref >60)
Glucose, Bld: 97 mg/dL (ref 65–99)
Potassium: 4.3 mmol/L (ref 3.5–5.1)
Sodium: 138 mmol/L (ref 135–145)
Total Bilirubin: 0.5 mg/dL (ref 0.3–1.2)
Total Protein: 7.9 g/dL (ref 6.5–8.1)

## 2016-11-03 LAB — URINALYSIS, ROUTINE W REFLEX MICROSCOPIC
Bilirubin Urine: NEGATIVE
Glucose, UA: NEGATIVE mg/dL
Hgb urine dipstick: NEGATIVE
Ketones, ur: NEGATIVE mg/dL
Leukocytes, UA: NEGATIVE
Nitrite: NEGATIVE
Protein, ur: NEGATIVE mg/dL
Specific Gravity, Urine: 1.014 (ref 1.005–1.030)
pH: 5 (ref 5.0–8.0)

## 2016-11-03 LAB — PROTIME-INR
INR: 0.93
Prothrombin Time: 12.5 seconds (ref 11.4–15.2)

## 2016-11-03 LAB — CBC WITH DIFFERENTIAL/PLATELET
Basophils Absolute: 0 10*3/uL (ref 0–0.1)
Basophils Relative: 1 %
Eosinophils Absolute: 0.2 10*3/uL (ref 0–0.7)
Eosinophils Relative: 3 %
HCT: 39.4 % (ref 35.0–47.0)
Hemoglobin: 13.3 g/dL (ref 12.0–16.0)
Lymphocytes Relative: 21 %
Lymphs Abs: 1.5 10*3/uL (ref 1.0–3.6)
MCH: 27.6 pg (ref 26.0–34.0)
MCHC: 33.8 g/dL (ref 32.0–36.0)
MCV: 81.6 fL (ref 80.0–100.0)
Monocytes Absolute: 0.4 10*3/uL (ref 0.2–0.9)
Monocytes Relative: 6 %
Neutro Abs: 4.9 10*3/uL (ref 1.4–6.5)
Neutrophils Relative %: 69 %
Platelets: 150 10*3/uL (ref 150–440)
RBC: 4.83 MIL/uL (ref 3.80–5.20)
RDW: 23.5 % — ABNORMAL HIGH (ref 11.5–14.5)
WBC: 7.1 10*3/uL (ref 3.6–11.0)

## 2016-11-03 LAB — TYPE AND SCREEN
ABO/RH(D): AB NEG
Antibody Screen: NEGATIVE

## 2016-11-03 LAB — SURGICAL PCR SCREEN
MRSA, PCR: NEGATIVE
Staphylococcus aureus: NEGATIVE

## 2016-11-03 LAB — APTT: aPTT: 33 seconds (ref 24–36)

## 2016-11-03 NOTE — Patient Instructions (Signed)
Your procedure is scheduled on:Nov 18, 2016 (Thursday) Report to Same Day Surgery 2nd floor medical mall [facility] Entrance-take elevator on left to 2nd floor.  Check in with surgery information desk.) To find out your arrival time please call [phone] between 1PM - 3PM on Nov 17, 2016 (Wednesday)  Remember: Instructions that are not followed completely may result in serious medical risk, up to and including death, or upon the discretion of your surgeon and anesthesiologist your surgery may need to be rescheduled.    _x___ 1. Do not eat food or drink liquids after midnight. No gum chewing or hard candies                                __x__ 2. No Alcohol for 24 hours before or after surgery.   __x__3. No Smoking for 24 prior to surgery.   ____  4. Bring all medications with you on the day of surgery if instructed.    __x__ 5. Notify your doctor if there is any change in your medical condition     (cold, fever, infections).     Do not wear jewelry, make-up, hairpins, clips or nail polish.  Do not wear lotions, powders, or perfumes.   Do not shave 48 hours prior to surgery. Men may shave face and neck.  Do not bring valuables to the hospital.    [facility] is not responsible for any belongings or valuables.               Contacts, dentures or bridgework may not be worn into surgery.  Leave your suitcase in the car. After surgery it may be brought to your room.  For patients admitted to the hospital, discharge time is determined by your treatment team.                      Patients discharged the day of surgery will not be allowed to drive home.  You will need someone to drive you home and stay with you the night of your procedure.    Please read over the following fact sheets that you were given:   [facility] Preparing for Surgery and or MRSA Information   _x___ Take anti-hypertensive (unless it includes a diuretic), cardiac, seizure, asthma,     anti-reflux and psychiatric  medicines with a sip of water. These include:  1. LEVOTHYROXINE  2. OMEPRAZOLE  3.  4.  5.  6.  ____Fleets enema or Magnesium Citrate as directed.   _x___ Use CHG Soap or sage wipes as directed on instruction sheet   ____ Use inhalers on the day of surgery and bring to hospital day of surgery  ____ Stop Metformin and Janumet 2 days prior to surgery.    ____ Take 1/2 of usual insulin dose the night before surgery and none on the morning surgery.      _x___ Follow recommendations from Cardiologist, Pulmonologist or PCP regarding          stopping Aspirin, Coumadin, Pllavix ,Eliquis, Effient, or Pradaxa, and Pletal.  X____Stop Anti-inflammatories such as Advil, Aleve, Ibuprofen, Motrin, Naproxen, Naprosyn, Goodies powders or aspirin products. OK to take Tylenol and   Celebrex.                        _x___ Stop supplements until after surgery.  But may continue Vitamin D, Vitamin B,and multivitamin. (STOP  TURMERIC AND COQ10 NOW)         ____ Bring C-Pap to the hospital.

## 2016-11-03 NOTE — Telephone Encounter (Signed)
Janet Rice [phone] [id number] called from [facility] regarding wanting to know if pt labs came back from checking her iron before they can proceed with surgery? Please advise? Please fax to [phone].

## 2016-11-03 NOTE — Telephone Encounter (Signed)
Information that was faxed was not received by hospital. They are contacting Dr.Finnegan's office to confirm lab results.

## 2016-11-04 LAB — HEMOGLOBIN A1C
Hgb A1c MFr Bld: 5.6 % (ref 4.8–5.6)
Mean Plasma Glucose: 114 mg/dL

## 2016-11-08 DIAGNOSIS — M25551 Pain in right hip: Secondary | ICD-10-CM | POA: Diagnosis not present

## 2016-11-08 DIAGNOSIS — R2689 Other abnormalities of gait and mobility: Secondary | ICD-10-CM | POA: Diagnosis not present

## 2016-11-15 DIAGNOSIS — H35371 Puckering of macula, right eye: Secondary | ICD-10-CM | POA: Diagnosis not present

## 2016-11-15 DIAGNOSIS — H538 Other visual disturbances: Secondary | ICD-10-CM | POA: Diagnosis not present

## 2016-11-15 MED ORDER — KETOROLAC TROMETHAMINE 30 MG/ML IJ SOLN
INTRAMUSCULAR | Status: AC
  Filled 2016-11-15: qty 1

## 2016-11-18 ENCOUNTER — Inpatient Hospital Stay
Admission: RE | Admit: 2016-11-18 | Discharge: 2016-11-20 | DRG: 470 | Disposition: A | Discharge status: Home / Self Care | Payer: Medicare Other | Source: Ambulatory Visit | Attending: Orthopedic Surgery | Admitting: Orthopedic Surgery

## 2016-11-18 ENCOUNTER — Inpatient Hospital Stay: Payer: Medicare Other

## 2016-11-18 ENCOUNTER — Encounter
Admission: RE | Disposition: A | Discharge status: Home / Self Care | Payer: Self-pay | Source: Ambulatory Visit | Attending: Orthopedic Surgery

## 2016-11-18 ENCOUNTER — Encounter: Payer: Self-pay | Admitting: *Deleted

## 2016-11-18 ENCOUNTER — Inpatient Hospital Stay: Payer: Medicare Other | Admitting: Registered Nurse

## 2016-11-18 DIAGNOSIS — D509 Iron deficiency anemia, unspecified: Secondary | ICD-10-CM | POA: Diagnosis present

## 2016-11-18 DIAGNOSIS — K219 Gastro-esophageal reflux disease without esophagitis: Secondary | ICD-10-CM | POA: Diagnosis not present

## 2016-11-18 DIAGNOSIS — Z90710 Acquired absence of both cervix and uterus: Secondary | ICD-10-CM

## 2016-11-18 DIAGNOSIS — R12 Heartburn: Secondary | ICD-10-CM | POA: Diagnosis present

## 2016-11-18 DIAGNOSIS — R000 Tachycardia, unspecified: Secondary | ICD-10-CM | POA: Diagnosis present

## 2016-11-18 DIAGNOSIS — D649 Anemia, unspecified: Secondary | ICD-10-CM | POA: Diagnosis not present

## 2016-11-18 DIAGNOSIS — Z9049 Acquired absence of other specified parts of digestive tract: Secondary | ICD-10-CM | POA: Diagnosis not present

## 2016-11-18 DIAGNOSIS — E785 Hyperlipidemia, unspecified: Secondary | ICD-10-CM | POA: Diagnosis present

## 2016-11-18 DIAGNOSIS — Z96641 Presence of right artificial hip joint: Secondary | ICD-10-CM | POA: Diagnosis not present

## 2016-11-18 DIAGNOSIS — Z9221 Personal history of antineoplastic chemotherapy: Secondary | ICD-10-CM

## 2016-11-18 DIAGNOSIS — Z85820 Personal history of malignant melanoma of skin: Secondary | ICD-10-CM

## 2016-11-18 DIAGNOSIS — E039 Hypothyroidism, unspecified: Secondary | ICD-10-CM | POA: Diagnosis present

## 2016-11-18 DIAGNOSIS — Z87891 Personal history of nicotine dependence: Secondary | ICD-10-CM | POA: Diagnosis not present

## 2016-11-18 DIAGNOSIS — Z471 Aftercare following joint replacement surgery: Secondary | ICD-10-CM | POA: Diagnosis not present

## 2016-11-18 DIAGNOSIS — M1611 Unilateral primary osteoarthritis, right hip: Secondary | ICD-10-CM

## 2016-11-18 DIAGNOSIS — E784 Other hyperlipidemia: Secondary | ICD-10-CM | POA: Diagnosis not present

## 2016-11-18 HISTORY — PX: TOTAL HIP ARTHROPLASTY: SHX124

## 2016-11-18 LAB — CBC
HCT: 32 % — ABNORMAL LOW (ref 35.0–47.0)
Hemoglobin: 10.6 g/dL — ABNORMAL LOW (ref 12.0–16.0)
MCH: 27.6 pg (ref 26.0–34.0)
MCHC: 33.2 g/dL (ref 32.0–36.0)
MCV: 83.1 fL (ref 80.0–100.0)
Platelets: 141 10*3/uL — ABNORMAL LOW (ref 150–440)
RBC: 3.85 MIL/uL (ref 3.80–5.20)
RDW: 22.6 % — ABNORMAL HIGH (ref 11.5–14.5)
WBC: 16.5 10*3/uL — ABNORMAL HIGH (ref 3.6–11.0)

## 2016-11-18 LAB — CREATININE, SERUM
Creatinine, Ser: 1.04 mg/dL — ABNORMAL HIGH (ref 0.44–1.00)
GFR calc Af Amer: >60 mL/min (ref >60)
GFR calc non Af Amer: 53 mL/min — ABNORMAL LOW (ref >60)

## 2016-11-18 SURGERY — ARTHROPLASTY, HIP, TOTAL,POSTERIOR APPROACH
Anesthesia: General | Site: Hip | Laterality: Right | Wound class: Clean

## 2016-11-18 MED ORDER — KETOROLAC TROMETHAMINE 15 MG/ML IJ SOLN
15.0000 mg | Freq: Four times a day (QID) | INTRAMUSCULAR | Status: DC | PRN
  Administered 2016-11-19 – 2016-11-20 (×2): 15 mg via INTRAVENOUS
  Filled 2016-11-18 (×2): qty 1

## 2016-11-18 MED ORDER — PANTOPRAZOLE SODIUM 40 MG PO TBEC
40.0000 mg | DELAYED_RELEASE_TABLET | Freq: Every day | ORAL | Status: DC
  Administered 2016-11-19 – 2016-11-20 (×2): 40 mg via ORAL
  Filled 2016-11-18 (×2): qty 1

## 2016-11-18 MED ORDER — POLYVINYL ALCOHOL 1.4 % OP SOLN: 1.0000 [drp] | Freq: Three times a day (TID) | OPHTHALMIC | Status: DC | PRN

## 2016-11-18 MED ORDER — PROPOFOL 10 MG/ML IV BOLUS
INTRAVENOUS | Status: DC | PRN
  Administered 2016-11-18: 100 mg via INTRAVENOUS

## 2016-11-18 MED ORDER — FLEET ENEMA 7-19 GM/118ML RE ENEM: 1.0000 | ENEMA | Freq: Once | RECTAL | Status: DC | PRN

## 2016-11-18 MED ORDER — PROPOFOL 10 MG/ML IV BOLUS
INTRAVENOUS | Status: AC
  Filled 2016-11-18: qty 20

## 2016-11-18 MED ORDER — ZOLPIDEM TARTRATE 5 MG PO TABS: 5.0000 mg | ORAL_TABLET | Freq: Every evening | ORAL | Status: DC | PRN

## 2016-11-18 MED ORDER — CEFAZOLIN SODIUM-DEXTROSE 2-4 GM/100ML-% IV SOLN
INTRAVENOUS | Status: AC
  Filled 2016-11-18: qty 100

## 2016-11-18 MED ORDER — ACETAMINOPHEN 650 MG RE SUPP: 650.0000 mg | Freq: Four times a day (QID) | RECTAL | Status: DC | PRN

## 2016-11-18 MED ORDER — PHENYLEPHRINE HCL 10 MG/ML IJ SOLN
INTRAMUSCULAR | Status: AC
  Filled 2016-11-18: qty 1

## 2016-11-18 MED ORDER — PHENYLEPHRINE HCL 10 MG/ML IJ SOLN
INTRAMUSCULAR | Status: DC | PRN
  Administered 2016-11-18 (×3): 50 ug via INTRAVENOUS

## 2016-11-18 MED ORDER — CEFAZOLIN SODIUM-DEXTROSE 2-4 GM/100ML-% IV SOLN
2.0000 g | INTRAVENOUS | Status: AC
  Administered 2016-11-18: 2 g via INTRAVENOUS

## 2016-11-18 MED ORDER — POLYETHYL GLYCOL-PROPYL GLYCOL 0.4-0.3 % OP SOLN: Freq: Three times a day (TID) | OPHTHALMIC | Status: DC | PRN

## 2016-11-18 MED ORDER — ROCURONIUM BROMIDE 50 MG/5ML IV SOLN
INTRAVENOUS | Status: AC
  Filled 2016-11-18: qty 1

## 2016-11-18 MED ORDER — ENOXAPARIN SODIUM 40 MG/0.4ML ~~LOC~~ SOLN
40.0000 mg | SUBCUTANEOUS | Status: DC
  Administered 2016-11-19 – 2016-11-20 (×2): 40 mg via SUBCUTANEOUS
  Filled 2016-11-18 (×2): qty 0.4

## 2016-11-18 MED ORDER — ACETAMINOPHEN 500 MG PO TABS
1000.0000 mg | ORAL_TABLET | Freq: Four times a day (QID) | ORAL | Status: AC
  Administered 2016-11-18 – 2016-11-19 (×4): 1000 mg via ORAL
  Filled 2016-11-18 (×3): qty 2

## 2016-11-18 MED ORDER — EPHEDRINE SULFATE 50 MG/ML IJ SOLN
INTRAMUSCULAR | Status: AC
  Filled 2016-11-18: qty 1

## 2016-11-18 MED ORDER — CLINDAMYCIN PHOSPHATE 600 MG/50ML IV SOLN
600.0000 mg | Freq: Once | INTRAVENOUS | Status: AC
  Administered 2016-11-18: 600 mg via INTRAVENOUS

## 2016-11-18 MED ORDER — FERROUS SULFATE 325 (65 FE) MG PO TABS
325.0000 mg | ORAL_TABLET | Freq: Every day | ORAL | Status: DC
  Administered 2016-11-19: 325 mg via ORAL
  Filled 2016-11-18: qty 1

## 2016-11-18 MED ORDER — FENTANYL CITRATE (PF) 250 MCG/5ML IJ SOLN
INTRAMUSCULAR | Status: AC
  Filled 2016-11-18: qty 5

## 2016-11-18 MED ORDER — DEXAMETHASONE SODIUM PHOSPHATE 10 MG/ML IJ SOLN
INTRAMUSCULAR | Status: DC | PRN
  Administered 2016-11-18: 5 mg via INTRAVENOUS

## 2016-11-18 MED ORDER — LEVOTHYROXINE SODIUM 125 MCG PO TABS
125.0000 ug | ORAL_TABLET | Freq: Every day | ORAL | Status: DC
  Administered 2016-11-19 – 2016-11-20 (×2): 125 ug via ORAL
  Filled 2016-11-18 (×2): qty 1

## 2016-11-18 MED ORDER — CELECOXIB 200 MG PO CAPS
200.0000 mg | ORAL_CAPSULE | Freq: Every day | ORAL | Status: DC
  Administered 2016-11-18 – 2016-11-20 (×3): 200 mg via ORAL
  Filled 2016-11-18 (×3): qty 1

## 2016-11-18 MED ORDER — DIFLUPREDNATE 0.05 % OP EMUL
1.0000 [drp] | Freq: Four times a day (QID) | OPHTHALMIC | Status: DC
  Administered 2016-11-18 (×3): 1 [drp] via OPHTHALMIC

## 2016-11-18 MED ORDER — SUCCINYLCHOLINE CHLORIDE 20 MG/ML IJ SOLN
INTRAMUSCULAR | Status: DC | PRN
  Administered 2016-11-18: 100 mg via INTRAVENOUS

## 2016-11-18 MED ORDER — ADULT MULTIVITAMIN W/MINERALS CH
1.0000 | ORAL_TABLET | Freq: Every day | ORAL | Status: DC
  Administered 2016-11-19: 1 via ORAL
  Filled 2016-11-18: qty 1

## 2016-11-18 MED ORDER — FENTANYL CITRATE (PF) 100 MCG/2ML IJ SOLN
25.0000 ug | INTRAMUSCULAR | Status: DC | PRN
  Administered 2016-11-18 (×4): 25 ug via INTRAVENOUS

## 2016-11-18 MED ORDER — SODIUM CHLORIDE 0.9 % IJ SOLN
INTRAMUSCULAR | Status: AC
  Filled 2016-11-18: qty 10

## 2016-11-18 MED ORDER — SUGAMMADEX SODIUM 200 MG/2ML IV SOLN
INTRAVENOUS | Status: AC
  Filled 2016-11-18: qty 2

## 2016-11-18 MED ORDER — CEFAZOLIN SODIUM-DEXTROSE 2-4 GM/100ML-% IV SOLN
2.0000 g | Freq: Four times a day (QID) | INTRAVENOUS | Status: DC
Start: 2016-11-18 — End: 2016-11-18

## 2016-11-18 MED ORDER — LACTATED RINGERS IV SOLN
INTRAVENOUS | Status: DC
  Administered 2016-11-18 (×2): via INTRAVENOUS

## 2016-11-18 MED ORDER — NEOMYCIN-POLYMYXIN B GU 40-200000 IR SOLN
Status: AC
  Filled 2016-11-18: qty 20

## 2016-11-18 MED ORDER — PROBIOTIC PO CAPS: ORAL_CAPSULE | Freq: Every day | ORAL | Status: DC

## 2016-11-18 MED ORDER — ALUM & MAG HYDROXIDE-SIMETH 200-200-20 MG/5ML PO SUSP: 30.0000 mL | ORAL | Status: DC | PRN

## 2016-11-18 MED ORDER — CHLORHEXIDINE GLUCONATE 4 % EX LIQD
60.0000 mL | Freq: Once | CUTANEOUS | Status: AC
  Administered 2016-11-18: 4 via TOPICAL

## 2016-11-18 MED ORDER — DIPHENHYDRAMINE HCL 12.5 MG/5ML PO ELIX: 12.5000 mg | ORAL_SOLUTION | ORAL | Status: DC | PRN

## 2016-11-18 MED ORDER — LOPERAMIDE HCL 2 MG PO CAPS
2.0000 mg | ORAL_CAPSULE | Freq: Every morning | ORAL | Status: DC
  Administered 2016-11-20: 2 mg via ORAL
  Filled 2016-11-18 (×2): qty 1

## 2016-11-18 MED ORDER — COQ10 50 MG PO CAPS: 50.0000 mg | ORAL_CAPSULE | Freq: Every day | ORAL | Status: DC

## 2016-11-18 MED ORDER — METHOCARBAMOL 500 MG PO TABS: 500.0000 mg | ORAL_TABLET | Freq: Four times a day (QID) | ORAL | Status: DC | PRN

## 2016-11-18 MED ORDER — MIDAZOLAM HCL 2 MG/2ML IJ SOLN
INTRAMUSCULAR | Status: AC
  Filled 2016-11-18: qty 2

## 2016-11-18 MED ORDER — ONDANSETRON HCL 4 MG/2ML IJ SOLN: 4.0000 mg | Freq: Four times a day (QID) | INTRAMUSCULAR | Status: DC | PRN

## 2016-11-18 MED ORDER — LIDOCAINE HCL (CARDIAC) 20 MG/ML IV SOLN
INTRAVENOUS | Status: DC | PRN
  Administered 2016-11-18: 80 mg via INTRAVENOUS

## 2016-11-18 MED ORDER — ACETAMINOPHEN 10 MG/ML IV SOLN
INTRAVENOUS | Status: DC | PRN
  Administered 2016-11-18: 1000 mg via INTRAVENOUS

## 2016-11-18 MED ORDER — SUCCINYLCHOLINE CHLORIDE 20 MG/ML IJ SOLN
INTRAMUSCULAR | Status: AC
  Filled 2016-11-18: qty 1

## 2016-11-18 MED ORDER — FENTANYL CITRATE (PF) 100 MCG/2ML IJ SOLN
INTRAMUSCULAR | Status: AC
  Administered 2016-11-18: 25 ug via INTRAVENOUS
  Filled 2016-11-18: qty 2

## 2016-11-18 MED ORDER — METHOCARBAMOL 1000 MG/10ML IJ SOLN: 500.0000 mg | Freq: Four times a day (QID) | INTRAVENOUS | Status: DC | PRN

## 2016-11-18 MED ORDER — MORPHINE SULFATE (PF) 2 MG/ML IV SOLN: 2.0000 mg | INTRAVENOUS | Status: DC | PRN

## 2016-11-18 MED ORDER — ONDANSETRON HCL 4 MG/2ML IJ SOLN: 4.0000 mg | Freq: Once | INTRAMUSCULAR | Status: DC | PRN

## 2016-11-18 MED ORDER — ACETAMINOPHEN 10 MG/ML IV SOLN
INTRAVENOUS | Status: AC
  Filled 2016-11-18: qty 100

## 2016-11-18 MED ORDER — BISACODYL 5 MG PO TBEC: 5.0000 mg | DELAYED_RELEASE_TABLET | Freq: Every day | ORAL | Status: DC | PRN

## 2016-11-18 MED ORDER — CALCIUM CARBONATE-VITAMIN D 500-200 MG-UNIT PO TABS
1.0000 | ORAL_TABLET | Freq: Every day | ORAL | Status: DC
  Administered 2016-11-19: 1 via ORAL
  Filled 2016-11-18: qty 1

## 2016-11-18 MED ORDER — CHLORHEXIDINE GLUCONATE 4 % EX LIQD: 60.0000 mL | Freq: Once | CUTANEOUS | Status: DC

## 2016-11-18 MED ORDER — ONDANSETRON HCL 4 MG/2ML IJ SOLN
INTRAMUSCULAR | Status: AC
Start: 2016-11-18 — End: 2016-11-18
  Filled 2016-11-18: qty 2

## 2016-11-18 MED ORDER — MAGNESIUM HYDROXIDE 400 MG/5ML PO SUSP: 30.0000 mL | Freq: Every day | ORAL | Status: DC | PRN

## 2016-11-18 MED ORDER — DOCUSATE SODIUM 100 MG PO CAPS
100.0000 mg | ORAL_CAPSULE | Freq: Two times a day (BID) | ORAL | Status: DC
  Administered 2016-11-18 – 2016-11-19 (×2): 100 mg via ORAL
  Filled 2016-11-18 (×4): qty 1

## 2016-11-18 MED ORDER — CLINDAMYCIN PHOSPHATE 900 MG/50ML IV SOLN
900.0000 mg | Freq: Three times a day (TID) | INTRAVENOUS | Status: AC
  Administered 2016-11-18 – 2016-11-19 (×2): 900 mg via INTRAVENOUS
  Filled 2016-11-18 (×2): qty 50

## 2016-11-18 MED ORDER — EPHEDRINE SULFATE 50 MG/ML IJ SOLN
INTRAMUSCULAR | Status: DC | PRN
  Administered 2016-11-18 (×2): 5 mg via INTRAVENOUS

## 2016-11-18 MED ORDER — HYDROMORPHONE HCL 1 MG/ML IJ SOLN
INTRAMUSCULAR | Status: DC | PRN
  Administered 2016-11-18: .4 mg via INTRAVENOUS

## 2016-11-18 MED ORDER — NEOMYCIN-POLYMYXIN B GU 40-200000 IR SOLN
Status: DC | PRN
  Administered 2016-11-18: 16 mL

## 2016-11-18 MED ORDER — ONDANSETRON HCL 4 MG PO TABS: 4.0000 mg | ORAL_TABLET | Freq: Four times a day (QID) | ORAL | Status: DC | PRN

## 2016-11-18 MED ORDER — ONDANSETRON HCL 4 MG/2ML IJ SOLN
INTRAMUSCULAR | Status: DC | PRN
  Administered 2016-11-18: 4 mg via INTRAVENOUS

## 2016-11-18 MED ORDER — DEXTROSE 5 % IV SOLN
2.0000 g | Freq: Three times a day (TID) | INTRAVENOUS | Status: AC
  Administered 2016-11-18 (×2): 2 g via INTRAVENOUS
  Filled 2016-11-18 (×2): qty 2000

## 2016-11-18 MED ORDER — B COMPLEX-C PO TABS
1.0000 | ORAL_TABLET | Freq: Every day | ORAL | Status: DC
  Administered 2016-11-19: 1 via ORAL
  Filled 2016-11-18 (×3): qty 1

## 2016-11-18 MED ORDER — ROCURONIUM BROMIDE 100 MG/10ML IV SOLN
INTRAVENOUS | Status: DC | PRN
  Administered 2016-11-18 (×3): 10 mg via INTRAVENOUS
  Administered 2016-11-18: 20 mg via INTRAVENOUS
  Administered 2016-11-18: 35 mg via INTRAVENOUS
  Administered 2016-11-18: 5 mg via INTRAVENOUS
  Administered 2016-11-18: 10 mg via INTRAVENOUS

## 2016-11-18 MED ORDER — SUGAMMADEX SODIUM 200 MG/2ML IV SOLN
INTRAVENOUS | Status: DC | PRN
  Administered 2016-11-18: 200 mg via INTRAVENOUS

## 2016-11-18 MED ORDER — ACETAMINOPHEN 325 MG PO TABS
650.0000 mg | ORAL_TABLET | Freq: Four times a day (QID) | ORAL | Status: DC | PRN
Start: 2016-11-18 — End: 2016-11-20
  Administered 2016-11-19: 650 mg via ORAL
  Filled 2016-11-18: qty 2

## 2016-11-18 MED ORDER — CLINDAMYCIN PHOSPHATE 600 MG/50ML IV SOLN
INTRAVENOUS | Status: AC
  Filled 2016-11-18: qty 50

## 2016-11-18 MED ORDER — PRAVASTATIN SODIUM 20 MG PO TABS
20.0000 mg | ORAL_TABLET | Freq: Every day | ORAL | Status: DC
  Administered 2016-11-18 – 2016-11-19 (×2): 20 mg via ORAL
  Filled 2016-11-18 (×2): qty 1

## 2016-11-18 MED ORDER — RISAQUAD PO CAPS
1.0000 | ORAL_CAPSULE | Freq: Every day | ORAL | Status: DC
  Administered 2016-11-19 – 2016-11-20 (×2): 1 via ORAL
  Filled 2016-11-18 (×2): qty 1

## 2016-11-18 MED ORDER — SODIUM CHLORIDE 0.9 % IV SOLN
INTRAVENOUS | Status: DC
  Administered 2016-11-18 – 2016-11-19 (×2): via INTRAVENOUS

## 2016-11-18 MED ORDER — PHENOL 1.4 % MT LIQD: 1.0000 | OROMUCOSAL | Status: DC | PRN

## 2016-11-18 MED ORDER — OCUVITE-LUTEIN PO CAPS
1.0000 | ORAL_CAPSULE | Freq: Every day | ORAL | Status: DC
  Administered 2016-11-19 – 2016-11-20 (×2): 1 via ORAL
  Filled 2016-11-18 (×2): qty 1

## 2016-11-18 MED ORDER — OXYCODONE HCL 5 MG PO TABS: 5.0000 mg | ORAL_TABLET | ORAL | Status: DC | PRN

## 2016-11-18 MED ORDER — LIDOCAINE HCL (PF) 2 % IJ SOLN
INTRAMUSCULAR | Status: AC
  Filled 2016-11-18: qty 2

## 2016-11-18 MED ORDER — MENTHOL 3 MG MT LOZG: 1.0000 | LOZENGE | OROMUCOSAL | Status: DC | PRN

## 2016-11-18 MED ORDER — CITALOPRAM HYDROBROMIDE 20 MG PO TABS
20.0000 mg | ORAL_TABLET | Freq: Every day | ORAL | Status: DC
  Administered 2016-11-18 – 2016-11-19 (×2): 20 mg via ORAL
  Filled 2016-11-18 (×2): qty 1

## 2016-11-18 MED ORDER — HYDROMORPHONE HCL 1 MG/ML IJ SOLN
INTRAMUSCULAR | Status: AC
  Filled 2016-11-18: qty 1

## 2016-11-18 MED ORDER — FENTANYL CITRATE (PF) 100 MCG/2ML IJ SOLN
INTRAMUSCULAR | Status: DC | PRN
  Administered 2016-11-18: 50 ug via INTRAVENOUS
  Administered 2016-11-18: 100 ug via INTRAVENOUS

## 2016-11-18 SURGICAL SUPPLY — 56 items
BLADE DEBAKEY 8.0 (BLADE) ×2 IMPLANT
BLADE SAGITTAL WIDE XTHICK NO (BLADE) ×2 IMPLANT
BLADE SURG SZ10 CARB STEEL (BLADE) ×2 IMPLANT
CANISTER SUCT 1200ML W/VALVE (MISCELLANEOUS) ×2 IMPLANT
CANISTER SUCT 3000ML PPV (MISCELLANEOUS) ×4 IMPLANT
CAPT HIP TOTAL 2 ×2 IMPLANT
CATH TRAY METER 16FR LF (MISCELLANEOUS) ×2 IMPLANT
DRAPE IMP U-DRAPE 54X76 (DRAPES) ×2 IMPLANT
DRAPE INCISE IOBAN 66X60 STRL (DRAPES) ×2 IMPLANT
DRAPE SHEET LG 3/4 BI-LAMINATE (DRAPES) ×4 IMPLANT
DRAPE SURG 17X11 SM STRL (DRAPES) ×2 IMPLANT
DRAPE TABLE BACK 80X90 (DRAPES) ×2 IMPLANT
DRSG OPSITE POSTOP 4X12 (GAUZE/BANDAGES/DRESSINGS) ×2 IMPLANT
DRSG OPSITE POSTOP 4X14 (GAUZE/BANDAGES/DRESSINGS) ×2 IMPLANT
DURAPREP 26ML APPLICATOR (WOUND CARE) ×6 IMPLANT
ELECT BLADE 6.5 EXT (BLADE) ×2 IMPLANT
ELECT CAUTERY BLADE 6.4 (BLADE) ×2 IMPLANT
ELECT REM PT RETURN 9FT ADLT (ELECTROSURGICAL) ×2
ELECTRODE REM PT RTRN 9FT ADLT (ELECTROSURGICAL) ×1 IMPLANT
GLOVE BIOGEL PI IND STRL 9 (GLOVE) ×1 IMPLANT
GLOVE BIOGEL PI INDICATOR 9 (GLOVE) ×1
GLOVE SURG 9.0 ORTHO LTXF (GLOVE) ×4 IMPLANT
GOWN STRL REUS TWL 2XL XL LVL4 (GOWN DISPOSABLE) ×2 IMPLANT
GOWN STRL REUS W/ TWL LRG LVL3 (GOWN DISPOSABLE) ×1 IMPLANT
GOWN STRL REUS W/TWL LRG LVL3 (GOWN DISPOSABLE) ×1
HANDLE YANKAUER SUCT BULB TIP (MISCELLANEOUS) ×4 IMPLANT
KIT RM TURNOVER STRD PROC AR (KITS) ×4 IMPLANT
NEEDLE FILTER BLUNT 18X 1/2SAF (NEEDLE) ×1
NEEDLE FILTER BLUNT 18X1 1/2 (NEEDLE) ×1 IMPLANT
NEEDLE MAYO CATGUT SZ4 (NEEDLE) IMPLANT
NS IRRIG 1000ML POUR BTL (IV SOLUTION) ×2 IMPLANT
PACK HIP PROSTHESIS (MISCELLANEOUS) ×2 IMPLANT
PILLOW ABDUC SM (MISCELLANEOUS) ×2 IMPLANT
PULSAVAC PLUS IRRIG FAN TIP (DISPOSABLE) ×2
RETRIEVER SUT HEWSON (MISCELLANEOUS) ×2 IMPLANT
SOL .9 NS 3000ML IRR  AL (IV SOLUTION) ×1
SOL .9 NS 3000ML IRR UROMATIC (IV SOLUTION) ×1 IMPLANT
SPONGE LAP 18X18 5 PK (GAUZE/BANDAGES/DRESSINGS) ×2 IMPLANT
STAPLER SKIN PROX 35W (STAPLE) ×2 IMPLANT
STRAP SAFETY BODY (MISCELLANEOUS) ×2 IMPLANT
SUT BONE WAX W31G (SUTURE) IMPLANT
SUT ETHIBOND NAB CT1 #1 30IN (SUTURE) ×4 IMPLANT
SUT MNCRL 3 0 RB1 (SUTURE) IMPLANT
SUT MNCRL AB 3-0 PS2 27 (SUTURE) IMPLANT
SUT MONOCRYL 3 0 RB1 (SUTURE)
SUT TICRON 2-0 30IN 311381 (SUTURE) ×8 IMPLANT
SUT VIC AB 0 CT1 36 (SUTURE) ×4 IMPLANT
SUT VIC AB 2-0 CT1 27 (SUTURE) ×2
SUT VIC AB 2-0 CT1 TAPERPNT 27 (SUTURE) ×2 IMPLANT
SUT VIC AB 2-0 CT2 27 (SUTURE) IMPLANT
SYR 20CC LL (SYRINGE) ×2 IMPLANT
TAPE TRANSPORE STRL 2 31045 (GAUZE/BANDAGES/DRESSINGS) ×2 IMPLANT
TIP BRUSH PULSAVAC PLUS 24.33 (MISCELLANEOUS) ×2 IMPLANT
TIP FAN IRRIG PULSAVAC PLUS (DISPOSABLE) ×1 IMPLANT
TUBE SUCT KAM VAC (TUBING) ×2 IMPLANT
WATER STERILE IRR 1000ML POUR (IV SOLUTION) ×4 IMPLANT

## 2016-11-18 NOTE — Evaluation (Signed)
Physical Therapy Evaluation Patient Details Name: Janet Rice MRN: [id number] DOB: Feb 21, 1946 Today's Date: 11/18/2016   History of Present Illness  Pt admitted for R THR.   Clinical Impression  Pt is a pleasant 71 year old female who was admitted for R THR. Pt performs bed mobility with min assist, transfers with cga, and ambulation with cga and RW. Pt educated on post. Hip precautions. Pt demonstrates deficits with strength/mobility/pain. Would benefit from skilled PT to address above deficits and promote optimal return to PLOF. Recommend transition to [place] upon discharge from acute hospitalization.       Follow Up Recommendations Home health PT    Equipment Recommendations  None recommended by PT    Recommendations for Other Services OT consult     Precautions / Restrictions Precautions Precautions: Fall;Posterior Hip Precaution Booklet Issued: No Restrictions Weight Bearing Restrictions: Yes RLE Weight Bearing: Weight bearing as tolerated      Mobility  Bed Mobility Overal bed mobility: Needs Assistance Bed Mobility: Supine to Sit     Supine to sit: Min assist     General bed mobility comments: bed mobility performed with safe technique with pt able to follow commands for safety and maintain hip precautions. Once seated at EOB, able to sit with upright posture.  Transfers Overall transfer level: Needs assistance Equipment used: Rolling walker (2 wheeled) Transfers: Sit to/from Stand Sit to Stand: Min guard         General transfer comment: safe technique with transfer using RW and upright posture. Able to maintain correct precautions. No dizziness noted  Ambulation/Gait Ambulation/Gait assistance: Min guard Ambulation Distance (Feet): 3 Feet Assistive device: Rolling walker (2 wheeled) Gait Pattern/deviations: Step-to pattern     General Gait Details: ambulated using RW and step to gait pattern. Pt able to follow commands and safety ambulate to  recliner. Safe technique performed  Stairs            Wheelchair Mobility    Modified Rankin (Stroke Patients Only)       Balance Overall balance assessment: Needs assistance Sitting-balance support: Feet supported Sitting balance-Leahy Scale: Good     Standing balance support: Bilateral upper extremity supported Standing balance-Leahy Scale: Good                               Pertinent Vitals/Pain Pain Assessment: 0-10 Pain Score: 4  Pain Location: R hip Pain Descriptors / Indicators: Operative site guarding Pain Intervention(s): Limited activity within patient's tolerance;Ice applied    Home Living Family/patient expects to be discharged to:: Private residence Living Arrangements: Spouse/significant other Available Help at Discharge: Family;Available 24 hours/day Type of Home: House Home Access: Stairs to enter Entrance Stairs-Rails: None Entrance Stairs-Number of Steps: 2 Home Layout: One level Home Equipment: Walker - 2 wheels;Cane - single point      Prior Function Level of Independence: Independent with assistive device(s)         Comments: was using SPC prior to admission and just ambulating when she had to.     Hand Dominance        Extremity/Trunk Assessment   Upper Extremity Assessment Upper Extremity Assessment: Overall WFL for tasks assessed    Lower Extremity Assessment Lower Extremity Assessment: Generalized weakness (R LE grossly 3/5; L LE grossly 4+/5)       Communication   Communication: No difficulties  Cognition Arousal/Alertness: Awake/alert Behavior During Therapy: WFL for tasks assessed/performed Overall Cognitive Status: Within  Functional Limits for tasks assessed                                        General Comments      Exercises Other Exercises Other Exercises: supine ther-ex performed on R LE x 10 reps including ankle pumps, quad sets, glut sets, hip abd/add. All ther-ex  performed with min assist and safe technique   Assessment/Plan    PT Assessment Patient needs continued PT services  PT Problem List Decreased strength;Decreased activity tolerance;Decreased balance;Decreased mobility;Pain       PT Treatment Interventions DME instruction;Gait training;Therapeutic exercise    PT Goals (Current goals can be found in the Care Plan section)  Acute Rehab PT Goals Patient Stated Goal: to get stronger PT Goal Formulation: With patient Time For Goal Achievement: 12/02/16 Potential to Achieve Goals: Good    Frequency BID   Barriers to discharge        Co-evaluation               AM-PAC PT "6 Clicks" Daily Activity  Outcome Measure Difficulty turning over in bed (including adjusting bedclothes, sheets and blankets)?: Total Difficulty moving from lying on back to sitting on the side of the bed? : Total Difficulty sitting down on and standing up from a chair with arms (e.g., wheelchair, bedside commode, etc,.)?: Total Help needed moving to and from a bed to chair (including a wheelchair)?: A Little Help needed walking in hospital room?: A Little Help needed climbing 3-5 steps with a railing? : A Lot 6 Click Score: 11    End of Session Equipment Utilized During Treatment: Gait belt Activity Tolerance: Patient tolerated treatment well Patient left: in chair;with chair alarm set Nurse Communication: Mobility status PT Visit Diagnosis: Muscle weakness (generalized) (M62.81);Difficulty in walking, not elsewhere classified (R26.2);Pain Pain - Right/Left: Right Pain - part of body: Hip    Time: 3893-7342 PT Time Calculation (min) (ACUTE ONLY): 26 min   Charges:   PT Evaluation $PT Eval Moderate Complexity: 1 Procedure PT Treatments $Therapeutic Exercise: 8-22 mins   PT G Codes:        Greggory Stallion, PT, DPT [phone]   Ledell Codrington 11/18/2016, 4:47 PM

## 2016-11-18 NOTE — H&P (Signed)
The patient has been re-examined, and the chart reviewed, and there have been no interval changes to the documented history and physical.    The risks, benefits, and alternatives have been discussed at length, and the patient is willing to proceed.   

## 2016-11-18 NOTE — Op Note (Signed)
11/18/2016  11:18 AM  PATIENT:  Janet Rice   MRN: [id number]  PRE-OPERATIVE DIAGNOSIS:  M16.11 Unilateral primary osteoarthritis, right hip  POST-OPERATIVE DIAGNOSIS:  same  PROCEDURE:  Procedure(s): RIGHT TOTAL HIP ARTHROPLASTY  PREOPERATIVE INDICATIONS:    Janet Rice is an 71 y.o. female who has a diagnosis of right hip osteoarthritis and elected for surgical management after failing conservative treatment.  The risks benefits and alternatives were discussed with the patient including but not limited to the risks of total hip arthroplasty including infection requiring removal of the prosthesis, bleeding requiring blood transfusion, nerve injury especially to the sciatic nerve leading to foot drop or lower extremity numbness, periprosthetic fracture, dislocation leg length discrepancy, change in lower extremity rotation persistent hip pain, loosening or failure of the components and the need for revision surgery. Medical risks include but are not limited to DVT and pulmonary wasn't, myocardial infarction, stroke, pneumonia, respiratory failure and death.  OPERATIVE REPORT     SURGEON:  Thornton Park, MD    ASSISTANT:  Earnestine Leys, M.D.    ANESTHESIA:  General endotracheal intubation    COMPLICATIONS:  None.   EBL:  650 mL  SPECIMEN: Femoral head to pathology    COMPONENTS:  Stryker Accolade 2 femoral component size 4 with a 36 mm +0 head ball and a Stryker Trident PSL HA cluster acetabular shell size 52 with a Trident X3 10 degree lipped polyethylene liner and a single 7m Torx 6.5 mm cancellous bone screw, acetabular dome hole plug    PROCEDURE:   The patient was met in the holding area with her husband at the bedside. Her history and physical were updated.  The appropriate hip was identified and marked at the operative site after verbally confirming with the patient that this was the correct site of surgery.  The hip was marked with the word yes and my  initials. The patient was then transported to the OR  and underwent general anesthesia.  The patient was then placed in the lateral decubitus position with the operative side up and secured on the operating room table with a pegboard and all bony prominences were adequately padded. This included an axillary roll and additional padding around the nonoperative leg to prevent compression to the common peroneal nerve.    The operative lower extremity was prepped and draped in a sterile fashion.  A time out was performed prior to incision to verify patient's name, date of birth, medical record number, correct site of surgery correct procedure to be performed. Was also used to verify the patient received antibiotics now appropriate instruments, implants and radiographic studies were available in the room. Once all in attendance were in agreement, the case began.    A posterolateral approach was utilized via sharp dissection  carried down to the subcutaneous tissue.  Bleeding vessels were coagulated using electrocautery during dissection.  The deep fascia was identified and a key elevator was used to remove adherent subcutaneous tissue.  The deep fascia was then sharply incised with a # 10 blade.  The gluteus maximus muscle was then split in line with its fibers. Self-retaining retractors were  Inserted to retract the deep fascia.  Care was taken to avoid injury to the sciatic nerve.  The hip was then internally rotated and the short external rotators were identified and removed from the greater trochanter with electrocautery and reflected posteriorly to protect the sciatic nerve.  The hip capsule was identified and a T-shaped capsulotomy was performed revealing  the femoral head and neck. The leaflets of the capsule were tagged with #2 Tycron for later repair.  The femoral neck was exposed and osteotomized approximately a finger's breadth above the lesser trochanter.    The attention was then turned to the  acetabulum. The acetabulum was exposed using 2 Homan retractors.  After adequate visualization,the labrum and ligamentum teres were dissected from the acetabulum.  The acetabulum was then sequentially reamed until an excellent rim fit was achieved..  The trial acetabular component was then placed and had an excellent fit. The trial was then removed and the actual acetabular component was impacted into place.  Appropriate version and inclination was confirmed clinically matching the patient's bony anatomy and using the external alignment guide. A single 25 mmx 6.5 mm Torx cancellus screw was placed through one of the cluster hole of the acetabular component  A trial polyethylene liner was placed and the retractors removed.    A femoral skid and Cobra retractor were placed under the femoral neck to allow for adequate visualization of the femoral neck. A box osteotome was used to make the initial entry into the proximal femur. A single hand reamer was used to prepare the femoral canal. A T-shaped femoral canal sounder was then used to ensure no penetration femoral cortex had occurred during reaming. The proximal femur was then sequentially broached by hand. Once adequate mediolateral canal fill was achieved the size 4 trial broach, neck, and head was assembled and the hip was reduced. It was found to have excellent stability, equivalent leg lengths with functional range of motion.  A portable, intraoperative AP pelvis film was taken to evaluate component size and positioning and to confirm equivalent leg lengths.  After the x-ray, the trial components were then removed.  Attention was turned back to the acetabulum. The trial liner was removed. The acetabulum was copiously irrigated with pulse lavage. A hole eliminator was then placed by hand using a screwdriver. The real Stryker Trident X3 10 polyethylene liner was impacted into place.  The femoral canal was copiously irrigated and  the real femoral prosthesis  impacted into place with anatomic version.   The patient was found to have stability with a +0 head trial. The actual 36+0 mm femoral head component was then impacted onto the femoral trunnion. The hip was then reduced and taken through functional range of motion and found to have excellent stability. Leg lengths were equivalent. The hip joint was then copiously irrigated.  The deep fascia was then closed with interrupted 0 Vicryl suture. The subcutaneous tissues were closed with 0 Vicryl. The skin approximated with staples.  A dry sterile honeycomb dressing was placed over the incision.  The patient was then placed back in a supine on the operative table. Leg lengths were checked clinically and found to be equivalent. An abduction pillow was placed between the lower extremities. The patient was extubated and transferred to a hospital bed and brought to the PACU in stable condition.   I was scrubbed and present the entire case and all sharp and instrument counts were correct at the conclusion of the case. I spoke with the patient's husband in the postop consultation room to let him know the case was performed without complication and patient was stable in recovery room.     Timoteo Gaul, MD Orthopedic Surgeon   11/18/2016 11:18 AM

## 2016-11-18 NOTE — Progress Notes (Signed)
PHARMACIST - PHYSICIAN ORDER COMMUNICATION  CONCERNING: P&T Medication Policy on Herbal/Supplement  Medications  DESCRIPTION:  This patient's order for:  Co-Q 10  has been noted.  This product(s) is classified as an "herbal" or natural product. Due to a lack of definitive safety studies or FDA approval, nonstandard manufacturing practices, plus the potential risk of unknown drug-drug interactions while on inpatient medications, the Pharmacy and Therapeutics Committee does not permit the use of "herbal" or natural products of this type within [facility]   ACTION TAKEN: The pharmacy department is unable to verify this order at this time and your patient has been informed of this safety policy. Please reevaluate patient's clinical condition at discharge and address if the herbal or natural product(s) should be resumed at that time.

## 2016-11-18 NOTE — Anesthesia Post-op Follow-up Note (Cosign Needed)
Anesthesia QCDR form completed.        

## 2016-11-18 NOTE — Transfer of Care (Signed)
Immediate Anesthesia Transfer of Care Note  Patient: Janet Rice  Procedure(s) Performed: Procedure(s): TOTAL HIP ARTHROPLASTY (Right)  Patient Location: PACU  Anesthesia Type:General  Level of Consciousness: awake, alert  and oriented  Airway & Oxygen Therapy: Patient Spontanous Breathing and Patient connected to face mask oxygen  Post-op Assessment: Report given to RN and Post -op Vital signs reviewed and stable  Post vital signs: Reviewed and stable  Last Vitals:  Vitals:   11/18/16 0620 11/18/16 1105  BP: (!) 166/74 136/80  Pulse: 91 89  Resp: 20 (!) 22  Temp: 37.2 C 36.7 C    Last Pain:  Vitals:   11/18/16 1105  TempSrc:   PainSc: (P) Asleep         Complications: No apparent anesthesia complications

## 2016-11-18 NOTE — Anesthesia Procedure Notes (Signed)
Procedure Name: Intubation Date/Time: 11/18/2016 7:49 AM Performed by: Hedda Slade Pre-anesthesia Checklist: Patient identified, Patient being monitored, Timeout performed, Emergency Drugs available and Suction available Patient Re-evaluated:Patient Re-evaluated prior to inductionOxygen Delivery Method: Circle system utilized Preoxygenation: Pre-oxygenation with 100% oxygen Intubation Type: IV induction Ventilation: Mask ventilation without difficulty and Oral airway inserted - appropriate to patient size Laryngoscope Size: McGraph and 3 Grade View: Grade I Tube type: Oral Tube size: 7.0 mm Number of attempts: 1 Airway Equipment and Method: Stylet Placement Confirmation: ETT inserted through vocal cords under direct vision,  positive ETCO2 and breath sounds checked- equal and bilateral Secured at: 21 cm Tube secured with: Tape Dental Injury: Teeth and Oropharynx as per pre-operative assessment

## 2016-11-18 NOTE — Anesthesia Preprocedure Evaluation (Signed)
Anesthesia Evaluation  Patient identified by MRN, date of birth, ID band Patient awake    Reviewed: Allergy & Precautions, H&P , NPO status , Patient's Chart, lab work & pertinent test results, reviewed documented beta blocker date and time   History of Anesthesia Complications (+) PONV and history of anesthetic complications  Airway Mallampati: IV  TM Distance: <3 FB Neck ROM: full  Mouth opening: Limited Mouth Opening  Dental  (+) Teeth Intact   Pulmonary neg pulmonary ROS, neg shortness of breath, asthma , former smoker,    Pulmonary exam normal        Cardiovascular negative cardio ROS Normal cardiovascular exam Rhythm:regular Rate:Normal     Neuro/Psych  Neuromuscular disease negative neurological ROS  negative psych ROS   GI/Hepatic negative GI ROS, Neg liver ROS, GERD  Medicated,  Endo/Other  negative endocrine ROSHypothyroidism   Renal/GU negative Renal ROS  negative genitourinary   Musculoskeletal   Abdominal   Peds  Hematology negative hematology ROS (+) anemia ,   Anesthesia Other Findings Past Medical History: No date: Adenomatous colon polyp No date: Arthritis No date: Childhood asthma     Comment: AS A CHILD No date: Chronic heartburn     Comment: On omeprazole No date: Complication of anesthesia     Comment: HARD TO WAKE UP No date: External hemorrhoids No date: Frequent loose stools No date: GERD (gastroesophageal reflux disease) No date: Heme positive stool No date: Hyperlipidemia No date: Hypothyroidism No date: Iron deficiency anemia No date: Metastatic melanoma ([place])     Comment: Followed by Dr Oliva Bustard, previous chemo, no               recurrence, 2008? Past Surgical History: 1984: ABDOMINAL HYSTERECTOMY 03/2016: BACK SURGERY     Comment: LUMBAR 09/07/2016: CARPAL TUNNEL RELEASE Right     Comment: Procedure: CARPAL TUNNEL RELEASE;  Surgeon:               Thornton Park, MD;   Location: [facility] ORS;                Service: Orthopedics;  Laterality: Right; 1990: CHOLECYSTECTOMY 08/2006: COLONOSCOPY     Comment: Four sessile polyps found and removed in               sigmoid colon at splenic flexure and ascending               colon. 7 mm in size. Another removed from               transverse colon 4 mm. Path Report - showed               tubular adenoma and hyperplastic polyp. Advised              to repeat in 3.5 years (02/2010) 3.2.2011: COLONOSCOPY     Comment: 8 mm polyp in sigmoid colon, removed, 4 mm               polyp in descending colon, removed. Internal               hemorrhoids 3.2.2011: ESOPHAGOGASTRODUODENOSCOPY     Comment: Large hiatia hernia, esophagus normal,               mulitple small sessile polyps w/no stigmata of               recent bleeding found. Mildy erythermatous  mucosa w/no bleeding found in gatric antrum.               Normal duodenum. Bx done of gastric mucoal               abnormalitiy and duodeunum. PATH - no active               inflammation, antral mucosa w/mild foveolar               hyperplasia No date: FRACTURE SURGERY 12/2005: Lymph Node Removal     Comment: Left inguinal lymph node dissection 1993: MELANOMA EXCISION Left     Comment: Left thigh No date: ORIF ANKLE FRACTURE Right     Comment: Dr. Mack Guise   Reproductive/Obstetrics negative OB ROS                             Anesthesia Physical Anesthesia Plan  ASA: III  Anesthesia Plan: General ETT   Post-op Pain Management:    Induction: Intravenous  Airway Management Planned: Video Laryngoscope Planned and Oral ETT  Additional Equipment:   Intra-op Plan:   Post-operative Plan:   Informed Consent: I have reviewed the patients History and Physical, chart, labs and discussed the procedure including the risks, benefits and alternatives for the proposed anesthesia with the patient or authorized representative who has  indicated his/her understanding and acceptance.   Dental Advisory Given  Plan Discussed with: CRNA  Anesthesia Plan Comments:         Anesthesia Quick Evaluation

## 2016-11-18 NOTE — Op Note (Signed)
Subjective:  POST-OP CHECK:  Patient reports pain as mild.  Patient is doing well following a right total hip arthroplasty. She is up out of bed to a chair. Her pain is well-controlled. She has no other complaints.  Objective:   VITALS:   Vitals:   11/18/16 1423 11/18/16 1430 11/18/16 1500 11/18/16 1604  BP:  127/62 (!) 122/58 125/67  Pulse:  84 77 86  Resp:      Temp:    98.1 F (36.7 C)  TempSrc:    Oral  SpO2:  96%  (!) 89%  Weight: 93.9 kg (207 lb)       PHYSICAL EXAM:  Right lower extremity: Neurovascular intact Sensation intact distally Intact pulses distally Dorsiflexion/Plantar flexion intact Incision: scant drainage No cellulitis present Compartment soft  LABS  Results for orders placed or performed during the hospital encounter of 11/18/16 (from the past 24 hour(s))  Type and screen [place]     Status: None   Collection Time: 11/18/16  6:32 AM  Result Value Ref Range   ABO/RH(D) AB NEG    Antibody Screen NEG    Sample Expiration 11/21/2016   CBC     Status: Abnormal   Collection Time: 11/18/16  1:29 PM  Result Value Ref Range   WBC 16.5 (H) 3.6 - 11.0 K/uL   RBC 3.85 3.80 - 5.20 MIL/uL   Hemoglobin 10.6 (L) 12.0 - 16.0 g/dL   HCT 32.0 (L) 35.0 - 47.0 %   MCV 83.1 80.0 - 100.0 fL   MCH 27.6 26.0 - 34.0 pg   MCHC 33.2 32.0 - 36.0 g/dL   RDW 22.6 (H) 11.5 - 14.5 %   Platelets 141 (L) 150 - 440 K/uL  Creatinine, serum     Status: Abnormal   Collection Time: 11/18/16  1:29 PM  Result Value Ref Range   Creatinine, Ser 1.04 (H) 0.44 - 1.00 mg/dL   GFR calc non Af Amer 53 (L) >60 mL/min   GFR calc Af Amer >60 >60 mL/min    Dg Pelvis Portable  Result Date: 11/18/2016 CLINICAL DATA:  Right hip osteoarthritis EXAM: PORTABLE PELVIS 1-2 VIEWS COMPARISON:  11/18/2016 FINDINGS: Changes of right hip replacement noted. Normal AP alignment. No hardware complicating feature. Moderate to advanced degenerative changes in the left hip.  IMPRESSION: Right hip replacement.  No visible complicating feature. Electronically Signed   By: Rolm Baptise M.D.   On: 11/18/2016 10:17   Dg Pelvis Portable  Result Date: 11/18/2016 CLINICAL DATA:  Right hip osteoarthritis. EXAM: PORTABLE PELVIS 1-2 VIEWS COMPARISON:  01/27/2016 FINDINGS: A single portable intraoperative radiograph of the pelvis is provided. Sequelae of interval right total hip arthroplasty are identified. The prosthetic components are approximated with one another on this single projection. Postoperative gas and a surgical drain are partially visualized about the right hip. Moderate left hip osteoarthrosis is noted. IMPRESSION: Right total hip arthroplasty as above. Electronically Signed   By: Logan Bores M.D.   On: 11/18/2016 09:56   Dg Hip Port Unilat With Pelvis 1v Right  Result Date: 11/18/2016 CLINICAL DATA:  Recent right hip replacement EXAM: DG HIP (WITH OR WITHOUT PELVIS) 1V PORT RIGHT COMPARISON:  None. FINDINGS: Pelvic ring is intact. Degenerative changes of left hip are seen. Right hip replacement is noted in satisfactory position. No acute bony abnormality is noted. IMPRESSION: Status post right hip replacement Electronically Signed   By: Inez Catalina M.D.   On: 11/18/2016 11:55    Assessment/Plan:  Day of Surgery   Active Problems:   Status post total hip replacement, right   Patient is doing well postop. She will continue physical therapy tomorrow. Patient will have labs drawn in the morning. Her Foley catheter will be removed tomorrow morning. She'll complete 24 hours of postop antibiotics. Patient should be encouraged to use incentive spirometry while awake. She will start Lovenox tomorrow for DVT prophylaxis. I reviewed the patient's postoperative x-rays which demonstrate the prosthesis is well positioned. There is no evidence of fracture dislocation or other osseous abnormality. Leg lengths appear equivalent.   Thornton Park , MD 11/18/2016, 5:13  PM

## 2016-11-19 LAB — CBC
HCT: 26.8 % — ABNORMAL LOW (ref 35.0–47.0)
Hemoglobin: 9 g/dL — ABNORMAL LOW (ref 12.0–16.0)
MCH: 28 pg (ref 26.0–34.0)
MCHC: 33.6 g/dL (ref 32.0–36.0)
MCV: 83.2 fL (ref 80.0–100.0)
Platelets: 130 10*3/uL — ABNORMAL LOW (ref 150–440)
RBC: 3.22 MIL/uL — ABNORMAL LOW (ref 3.80–5.20)
RDW: 22.7 % — ABNORMAL HIGH (ref 11.5–14.5)
WBC: 9.1 10*3/uL (ref 3.6–11.0)

## 2016-11-19 LAB — BASIC METABOLIC PANEL
Anion gap: 4 — ABNORMAL LOW (ref 5–15)
BUN: 14 mg/dL (ref 6–20)
CO2: 32 mmol/L (ref 22–32)
Calcium: 8.3 mg/dL — ABNORMAL LOW (ref 8.9–10.3)
Chloride: 104 mmol/L (ref 101–111)
Creatinine, Ser: 0.91 mg/dL (ref 0.44–1.00)
GFR calc Af Amer: >60 mL/min (ref >60)
GFR calc non Af Amer: >60 mL/min (ref >60)
Glucose, Bld: 106 mg/dL — ABNORMAL HIGH (ref 65–99)
Potassium: 4.4 mmol/L (ref 3.5–5.1)
Sodium: 140 mmol/L (ref 135–145)

## 2016-11-19 NOTE — Care Management Note (Signed)
Case Management Note  Patient Details  Name: Janet Rice MRN: [id number] Date of Birth: April 19, 1946  Subjective/Objective:   Met with patient at bedside to discuss discharge planning. Patient has outpatient PT set up to start next Tues. May 15. At Dr. Harden Mo office. She has a walker. Pharmacy: [facility] [phone].Called Lovenox 40 mg 1 QD x 4 weeks.                  Action/Plan: Lovenox called in  Expected Discharge Date:  11/20/16               Expected Discharge Plan:  Home/Self Care  In-House Referral:     Discharge planning Services  CM Consult  Post Acute Care Choice:    Choice offered to:     DME Arranged:    DME Agency:     HH Arranged:    HH Agency:     Status of Service:  Completed, signed off  If discussed at [employer] of Stay Meetings, dates discussed:    Additional Comments:  Jolly Mango, RN 11/19/2016, 11:46 AM

## 2016-11-19 NOTE — Anesthesia Postprocedure Evaluation (Signed)
Anesthesia Post Note  Patient: Janet Rice  Procedure(s) Performed: Procedure(s) (LRB): TOTAL HIP ARTHROPLASTY (Right)  Patient location during evaluation: PACU Anesthesia Type: General Level of consciousness: awake and alert Pain management: pain level controlled Vital Signs Assessment: post-procedure vital signs reviewed and stable Respiratory status: spontaneous breathing, nonlabored ventilation, respiratory function stable and patient connected to nasal cannula oxygen Cardiovascular status: blood pressure returned to baseline and stable Postop Assessment: no signs of nausea or vomiting Anesthetic complications: no     Last Vitals:  Vitals:   11/18/16 2329 11/19/16 0433  BP: 120/62 (!) 110/56  Pulse: 80 81  Resp:  18  Temp: 36.9 C 36.6 C    Last Pain:  Vitals:   11/19/16 0433  TempSrc: Oral  PainSc:                  [name]

## 2016-11-19 NOTE — Evaluation (Signed)
Occupational Therapy Evaluation Patient Details Name: Janet Rice MRN: [id number] DOB: 04/29/1946 Today's Date: 11/19/2016    History of Present Illness Pt. is a 71 y.o. who was admitted to [facility] for a Right THR.   Clinical Impression   Pt. Is a 71 y.o. Female who was admitted to [facility] for a right THR. Pt. reports that she uses A/E daily at home since her back surgery. Pt. was able to adhere to 3/3 Hip precautions 100% of the time during the session. Pt. Was able to walk to, and from the bathroom commode, only requiring assistance for the IV pole. Pt. continues to benefit from skilled OT services while at [facility] to review hip precautions, and provide pt. education about home modification, DME, and work simplification strategies during ADLs/IADLs. Pt. Plans to return home with her husband upon discharge.    Follow Up Recommendations  No OT follow up    Equipment Recommendations       Recommendations for Other Services       Precautions / Restrictions Precautions Precautions: Fall;Posterior Hip Precaution Booklet Issued: No Restrictions Weight Bearing Restrictions: Yes RLE Weight Bearing: Weight bearing as tolerated                                                   ADL either performed or assessed with clinical judgement   ADL Overall ADL's : Needs assistance/impaired Eating/Feeding: Set up   Grooming: Modified independent;Standing           Upper Body Dressing : Set up   Lower Body Dressing: Minimal assistance   Toilet Transfer: Min guard for IV pole only   Toileting- Clothing Manipulation and Hygiene: Independent         General ADL Comments: Pt. has been using A/E at home since her back surgery over a year ago.      Vision         Perception     Praxis      Pertinent Vitals/Pain Pain Assessment: 0-10 Pain Score: 2  Pain Location: R hip Pain Descriptors / Indicators: Operative site guarding Pain Intervention(s): Limited  activity within patient's tolerance     Hand Dominance Right   Extremity/Trunk Assessment Upper Extremity Assessment Upper Extremity Assessment: Overall WFL for tasks assessed           Communication Communication Communication: No difficulties   Cognition Arousal/Alertness: Awake/alert Behavior During Therapy: WFL for tasks assessed/performed Overall Cognitive Status: Within Functional Limits for tasks assessed                                     General Comments       Exercises   Shoulder Instructions      Home Living Family/patient expects to be discharged to:: Private residence Living Arrangements: Spouse/significant other Available Help at Discharge: Family;Available 24 hours/day Type of Home: House Home Access: Stairs to enter [employer] of Steps: 2 Entrance Stairs-Rails: None Home Layout: One level     Bathroom Shower/Tub: Tub/shower unit;Walk-in shower   Bathroom Toilet: Standard     Home Equipment: Environmental consultant - 2 wheels;Cane - single point;Hand held shower head;Grab bars - tub/shower;Shower seat - built in          Prior Functioning/Environment Level of Independence: Independent with  assistive device(s)        Comments: Independent with ADLs, IADLs, working part time from home.        OT Problem List: Decreased strength;Pain;Decreased knowledge of precautions      OT Treatment/Interventions: Therapeutic exercise;Self-care/ADL training;Patient/family education;DME and/or AE instruction    OT Goals(Current goals can be found in the care plan section) Acute Rehab OT Goals Patient Stated Goal: To return home OT Goal Formulation: With patient Potential to Achieve Goals: Good  OT Frequency: Min 2X/week   Barriers to D/C:            Co-evaluation              AM-PAC PT "6 Clicks" Daily Activity     Outcome Measure Help from another person eating meals?: None Help from another person taking care of personal  grooming?: None Help from another person toileting, which includes using toliet, bedpan, or urinal?: None Help from another person bathing (including washing, rinsing, drying)?: A Little Help from another person to put on and taking off regular upper body clothing?: None Help from another person to put on and taking off regular lower body clothing?: A Little 6 Click Score: 22   End of Session Equipment Utilized During Treatment: Gait belt  Activity Tolerance: Patient tolerated treatment well Patient left: with call bell/phone within reach;with chair alarm set  OT Visit Diagnosis: Muscle weakness (generalized) (M62.81)                Time: 5320-2334 OT Time Calculation (min): 23 min Charges:  OT General Charges $OT Visit: 1 Procedure OT Evaluation $OT Eval Low Complexity: 1 Procedure G-Codes:    Harrel Carina, MS, OTR/L  Harrel Carina, MS, OTR/L 11/19/2016, 1:46 PM

## 2016-11-19 NOTE — Progress Notes (Signed)
Physical Therapy Treatment Patient Details Name: Janet Rice MRN: [id number] DOB: 14-Nov-1945 Today's Date: 11/19/2016    History of Present Illness Pt. is a 71 y.o. who was admitted to [facility] for a Right THR.    PT Comments    Pt is making great progress towards goals with ability to ambulate around RN station this session. Good endurance noted with there-ex and reviewed written HEP. Able to recite 3/3 hip precautions correctly. Still needs to perform stair training prior to discharge.   Follow Up Recommendations  Home health PT     Equipment Recommendations  None recommended by PT    Recommendations for Other Services OT consult     Precautions / Restrictions Precautions Precautions: Fall;Posterior Hip Precaution Booklet Issued: Yes (comment) Restrictions Weight Bearing Restrictions: Yes RLE Weight Bearing: Weight bearing as tolerated    Mobility  Bed Mobility Overal bed mobility: Needs Assistance Bed Mobility: Sit to Supine       Sit to supine: Min guard   General bed mobility comments: safe technique with cues for sequencing. Follows commands well  Transfers Overall transfer level: Needs assistance Equipment used: Rolling walker (2 wheeled) Transfers: Sit to/from Stand Sit to Stand: Min guard         General transfer comment: safe technique noted with ability to maintain correct hip precautions  Ambulation/Gait Ambulation/Gait assistance: Min guard Ambulation Distance (Feet): 250 Feet Assistive device: Rolling walker (2 wheeled) Gait Pattern/deviations: Step-through pattern     General Gait Details: Pt ambulated using reciprocal gait and safe technique. Pt fatigues with increased distance. Good speed noted and decreased pain. Needs cues for upright posture   Stairs            Wheelchair Mobility    Modified Rankin (Stroke Patients Only)       Balance                                            Cognition  Arousal/Alertness: Awake/alert Behavior During Therapy: WFL for tasks assessed/performed Overall Cognitive Status: Within Functional Limits for tasks assessed                                        Exercises Other Exercises Other Exercises: supine ther-ex performed on R LE x 12 reps including ankle pumps, quad sets, glut sets, SAQ, and hip abd/add. All ther-ex performed with cga and safe technique    General Comments        Pertinent Vitals/Pain Pain Assessment: 0-10 Pain Score: 3  Pain Location: R hip Pain Descriptors / Indicators: Operative site guarding Pain Intervention(s): Limited activity within patient's tolerance    Home Living Family/patient expects to be discharged to:: Private residence Living Arrangements: Spouse/significant other Available Help at Discharge: Family;Available 24 hours/day Type of Home: House Home Access: Stairs to enter Entrance Stairs-Rails: None Home Layout: One level Home Equipment: Environmental consultant - 2 wheels;Cane - single point;Hand held shower head;Grab bars - tub/shower;Shower seat - built in      Prior Function Level of Independence: Independent with assistive device(s)      Comments: Independent with ADLs, IADLs, working part time from home.   PT Goals (current goals can now be found in the care plan section) Acute Rehab PT Goals Patient Stated Goal: To return home PT  Goal Formulation: With patient Time For Goal Achievement: 12/02/16 Potential to Achieve Goals: Good Progress towards PT goals: Progressing toward goals    Frequency    BID      PT Plan Current plan remains appropriate    Co-evaluation              AM-PAC PT "6 Clicks" Daily Activity  Outcome Measure  Difficulty turning over in bed (including adjusting bedclothes, sheets and blankets)?: Total Difficulty moving from lying on back to sitting on the side of the bed? : Total Difficulty sitting down on and standing up from a chair with arms (e.g.,  wheelchair, bedside commode, etc,.)?: Total Help needed moving to and from a bed to chair (including a wheelchair)?: A Little Help needed walking in hospital room?: A Little Help needed climbing 3-5 steps with a railing? : A Little 6 Click Score: 12    End of Session Equipment Utilized During Treatment: Gait belt Activity Tolerance: Patient tolerated treatment well Patient left: in bed;with bed alarm set;with family/visitor present;with SCD's reapplied Nurse Communication: Mobility status PT Visit Diagnosis: Muscle weakness (generalized) (M62.81);Difficulty in walking, not elsewhere classified (R26.2);Pain Pain - Right/Left: Right Pain - part of body: Hip     Time: 7897-8478 PT Time Calculation (min) (ACUTE ONLY): 23 min  Charges:  $Gait Training: 8-22 mins $Therapeutic Exercise: 8-22 mins                    G Codes:       Janet Rice, PT, DPT [phone]    Janet Rice 11/19/2016, 3:40 PM

## 2016-11-19 NOTE — Progress Notes (Signed)
Physical Therapy Treatment Patient Details Name: Janet Rice MRN: [id number] DOB: [birth year] Today's Date: 11/19/2016    History of Present Illness Pt admitted for R THR.     PT Comments    Pt is making good progress towards goals with increased distance this session and progressing to a more fluid gait pattern. Good endurance noted with R LE there-ex. Pt able to recite 3/3 hip precautions. Limited pain noted this date. Will continue to progress.   Follow Up Recommendations  Home health PT     Equipment Recommendations  None recommended by PT    Recommendations for Other Services OT consult     Precautions / Restrictions Precautions Precautions: Fall;Posterior Hip Precaution Booklet Issued: No Restrictions Weight Bearing Restrictions: Yes RLE Weight Bearing: Weight bearing as tolerated    Mobility  Bed Mobility Overal bed mobility: Needs Assistance Bed Mobility: Supine to Sit     Supine to sit: Min guard     General bed mobility comments: safe technique with cues for sequencing. Once seated, pt able to sit with upright posture  Transfers Overall transfer level: Needs assistance Equipment used: Rolling walker (2 wheeled) Transfers: Sit to/from Stand Sit to Stand: Min guard         General transfer comment: safe technique with RW and upright standing.   Ambulation/Gait Ambulation/Gait assistance: Min guard Ambulation Distance (Feet): 115 Feet Assistive device: Rolling walker (2 wheeled) Gait Pattern/deviations: Step-to pattern     General Gait Details: Pt ambulated using step- to gait pattern, able to progress to reciprocal gait inconsistently. Able to follow commands for safety and maintain hip precautions. Pt fatigues with increased distance this session.   Stairs            Wheelchair Mobility    Modified Rankin (Stroke Patients Only)       Balance                                            Cognition  Arousal/Alertness: Awake/alert Behavior During Therapy: WFL for tasks assessed/performed Overall Cognitive Status: Within Functional Limits for tasks assessed                                        Exercises Other Exercises Other Exercises: supine ther-ex performed on R LE x 12 reps including ankle pumps, quad sets, glut sets, SAQ, and hip abd/add. All ther-ex performed with min assist and safe technique Other Exercises: ambulated to bathroom with cga and safe technique. Pt able to urinate and perform hygiene with cga.    General Comments        Pertinent Vitals/Pain Pain Assessment: 0-10 Pain Score: 3  Pain Location: R hip Pain Descriptors / Indicators: Operative site guarding Pain Intervention(s): Limited activity within patient's tolerance    Home Living                      Prior Function            PT Goals (current goals can now be found in the care plan section) Acute Rehab PT Goals Patient Stated Goal: to get stronger PT Goal Formulation: With patient Time For Goal Achievement: 12/02/16 Potential to Achieve Goals: Good Progress towards PT goals: Progressing toward goals    Frequency  BID      PT Plan Current plan remains appropriate    Co-evaluation              AM-PAC PT "6 Clicks" Daily Activity  Outcome Measure  Difficulty turning over in bed (including adjusting bedclothes, sheets and blankets)?: Total Difficulty moving from lying on back to sitting on the side of the bed? : Total Difficulty sitting down on and standing up from a chair with arms (e.g., wheelchair, bedside commode, etc,.)?: Total Help needed moving to and from a bed to chair (including a wheelchair)?: A Little Help needed walking in hospital room?: A Little Help needed climbing 3-5 steps with a railing? : A Little 6 Click Score: 12    End of Session Equipment Utilized During Treatment: Gait belt Activity Tolerance: Patient tolerated treatment  well Patient left: in chair;with chair alarm set Nurse Communication: Mobility status PT Visit Diagnosis: Muscle weakness (generalized) (M62.81);Difficulty in walking, not elsewhere classified (R26.2);Pain Pain - Right/Left: Right Pain - part of body: Hip     Time: 3143-8887 PT Time Calculation (min) (ACUTE ONLY): 23 min  Charges:  $Gait Training: 8-22 mins $Therapeutic Exercise: 8-22 mins                    G Codes:       Greggory Stallion, PT, DPT [phone]    Kiara Mcdowell 11/19/2016, 11:40 AM

## 2016-11-19 NOTE — Care Management Important Message (Signed)
Important Message  Patient Details  Name: Janet Rice MRN: [id number] Date of Birth: [birth year]   Medicare Important Message Given:  Yes    Jolly Mango, RN 11/19/2016, 9:48 AM

## 2016-11-19 NOTE — Progress Notes (Signed)
Subjective:  POST-OP Day #1:  Patient is status post right total arthroplasty. She is seen sitting up in a chair. Her husband is at the bedside. Patient reports pain as mild.  She is only taking Tylenol for pain. Patient states she walked out to the nurse's station today. She has no complaints.  Objective:   VITALS:   Vitals:   11/19/16 0433 11/19/16 0700 11/19/16 0825 11/19/16 1134  BP: (!) 110/56  (!) 168/50 (!) 97/42  Pulse: 81  75 87  Resp: 18  18 18   Temp: 97.8 F (36.6 C)  98.7 F (37.1 C) 98.2 F (36.8 C)  TempSrc: Oral  Oral Oral  SpO2: 99%  97% (!) 89%  Weight:      Height:  5\' 2"  (1.575 m)      PHYSICAL EXAM:  Right lower extremity: Neurovascular intact Sensation intact distally Intact pulses distally Dorsiflexion/Plantar flexion intact Incision: scant drainage No cellulitis present Compartment soft  LABS  Results for orders placed or performed during the hospital encounter of 11/18/16 (from the past 24 hour(s))  CBC     Status: Abnormal   Collection Time: 11/18/16  1:29 PM  Result Value Ref Range   WBC 16.5 (H) 3.6 - 11.0 K/uL   RBC 3.85 3.80 - 5.20 MIL/uL   Hemoglobin 10.6 (L) 12.0 - 16.0 g/dL   HCT 32.0 (L) 35.0 - 47.0 %   MCV 83.1 80.0 - 100.0 fL   MCH 27.6 26.0 - 34.0 pg   MCHC 33.2 32.0 - 36.0 g/dL   RDW 22.6 (H) 11.5 - 14.5 %   Platelets 141 (L) 150 - 440 K/uL  Creatinine, serum     Status: Abnormal   Collection Time: 11/18/16  1:29 PM  Result Value Ref Range   Creatinine, Ser 1.04 (H) 0.44 - 1.00 mg/dL   GFR calc non Af Amer 53 (L) >60 mL/min   GFR calc Af Amer >60 >60 mL/min  CBC     Status: Abnormal   Collection Time: 11/19/16  4:12 AM  Result Value Ref Range   WBC 9.1 3.6 - 11.0 K/uL   RBC 3.22 (L) 3.80 - 5.20 MIL/uL   Hemoglobin 9.0 (L) 12.0 - 16.0 g/dL   HCT 26.8 (L) 35.0 - 47.0 %   MCV 83.2 80.0 - 100.0 fL   MCH 28.0 26.0 - 34.0 pg   MCHC 33.6 32.0 - 36.0 g/dL   RDW 22.7 (H) 11.5 - 14.5 %   Platelets 130 (L) 150 - 440 K/uL   Basic metabolic panel     Status: Abnormal   Collection Time: 11/19/16  4:12 AM  Result Value Ref Range   Sodium 140 135 - 145 mmol/L   Potassium 4.4 3.5 - 5.1 mmol/L   Chloride 104 101 - 111 mmol/L   CO2 32 22 - 32 mmol/L   Glucose, Bld 106 (H) 65 - 99 mg/dL   BUN 14 6 - 20 mg/dL   Creatinine, Ser 0.91 0.44 - 1.00 mg/dL   Calcium 8.3 (L) 8.9 - 10.3 mg/dL   GFR calc non Af Amer >60 >60 mL/min   GFR calc Af Amer >60 >60 mL/min   Anion gap 4 (L) 5 - 15    Dg Pelvis Portable  Result Date: 11/18/2016 CLINICAL DATA:  Right hip osteoarthritis EXAM: PORTABLE PELVIS 1-2 VIEWS COMPARISON:  11/18/2016 FINDINGS: Changes of right hip replacement noted. Normal AP alignment. No hardware complicating feature. Moderate to advanced degenerative changes in the left hip. IMPRESSION:  Right hip replacement.  No visible complicating feature. Electronically Signed   By: Rolm Baptise M.D.   On: 11/18/2016 10:17   Dg Pelvis Portable  Result Date: 11/18/2016 CLINICAL DATA:  Right hip osteoarthritis. EXAM: PORTABLE PELVIS 1-2 VIEWS COMPARISON:  01/27/2016 FINDINGS: A single portable intraoperative radiograph of the pelvis is provided. Sequelae of interval right total hip arthroplasty are identified. The prosthetic components are approximated with one another on this single projection. Postoperative gas and a surgical drain are partially visualized about the right hip. Moderate left hip osteoarthrosis is noted. IMPRESSION: Right total hip arthroplasty as above. Electronically Signed   By: Logan Bores M.D.   On: 11/18/2016 09:56   Dg Hip Port Unilat With Pelvis 1v Right  Result Date: 11/18/2016 CLINICAL DATA:  Recent right hip replacement EXAM: DG HIP (WITH OR WITHOUT PELVIS) 1V PORT RIGHT COMPARISON:  None. FINDINGS: Pelvic ring is intact. Degenerative changes of left hip are seen. Right hip replacement is noted in satisfactory position. No acute bony abnormality is noted. IMPRESSION: Status post right hip  replacement Electronically Signed   By: Inez Catalina M.D.   On: 11/18/2016 11:55    Assessment/Plan: 1 Day Post-Op   Active Problems:   Status post total hip replacement, right   Patient doing very well postop. She will continue physical therapy. Patient will be on Lovenox 40 mg subcutaneous daily for DVT prophylaxis for 4 weeks postop. Patient may be weightbearing as far to the right lower extremity. Continue TED stockings and foot pumps as well as Lovenox for DVT prophylaxis while in the hospital. Patient's Foley catheter has been removed. She has completed 24 hours postop antibiotics. Continue to need to encourage incentive spirometry. Patient will have outpatient physical therapy after discharge in my office.   Thornton Park , MD 11/19/2016, 12:53 PM

## 2016-11-19 NOTE — Clinical Social Work Note (Signed)
CSW consulted for New SNF. PT is recommending HHPT. RNCM following for discharge planning needs. CSW is signing off as no further needs identified.   Darden Dates, MSW, LCSW  Clinical Social Worker  [phone]

## 2016-11-20 LAB — CBC
HCT: 23.4 % — ABNORMAL LOW (ref 35.0–47.0)
HCT: 24.4 % — ABNORMAL LOW (ref 35.0–47.0)
Hemoglobin: 7.8 g/dL — ABNORMAL LOW (ref 12.0–16.0)
Hemoglobin: 8.3 g/dL — ABNORMAL LOW (ref 12.0–16.0)
MCH: 28.2 pg (ref 26.0–34.0)
MCH: 29.1 pg (ref 26.0–34.0)
MCHC: 33.5 g/dL (ref 32.0–36.0)
MCHC: 33.9 g/dL (ref 32.0–36.0)
MCV: 84.4 fL (ref 80.0–100.0)
MCV: 85.9 fL (ref 80.0–100.0)
Platelets: 113 10*3/uL — ABNORMAL LOW (ref 150–440)
Platelets: 121 10*3/uL — ABNORMAL LOW (ref 150–440)
RBC: 2.77 MIL/uL — ABNORMAL LOW (ref 3.80–5.20)
RBC: 2.84 MIL/uL — ABNORMAL LOW (ref 3.80–5.20)
RDW: 22.2 % — ABNORMAL HIGH (ref 11.5–14.5)
RDW: 22 % — ABNORMAL HIGH (ref 11.5–14.5)
WBC: 9 10*3/uL (ref 3.6–11.0)
WBC: 9.5 10*3/uL (ref 3.6–11.0)

## 2016-11-20 LAB — GLUCOSE, CAPILLARY: Glucose-Capillary: 136 mg/dL — ABNORMAL HIGH (ref 65–99)

## 2016-11-20 LAB — PREPARE RBC (CROSSMATCH)

## 2016-11-20 MED ORDER — ACETAMINOPHEN 325 MG PO TABS
650.0000 mg | ORAL_TABLET | Freq: Once | ORAL | Status: AC
  Administered 2016-11-20: 650 mg via ORAL
  Filled 2016-11-20: qty 2

## 2016-11-20 MED ORDER — SODIUM CHLORIDE 0.9 % IV SOLN
Freq: Once | INTRAVENOUS | Status: AC
  Administered 2016-11-20: 11:00:00 via INTRAVENOUS

## 2016-11-20 MED ORDER — OXYCODONE HCL 5 MG PO TABS: 5.0000 mg | ORAL_TABLET | ORAL | 0 refills | Status: DC | PRN

## 2016-11-20 NOTE — Progress Notes (Signed)
Patient d/c via wheelchair home with husband. Prescription given to patient along with discharge instructions. Patient denied questions concerning discharge instructions. IV removed without difficulty. Patient confirms knowledge of scheduled follow up appointment with Dr. Mack Guise.

## 2016-11-20 NOTE — Discharge Instructions (Signed)
Hip Arthroscopy, Care After Refer to this sheet in the next few weeks. These instructions provide you with information about caring for yourself after your procedure. Your health care provider may also give you more specific instructions. Your treatment has been planned according to current medical practices, but problems sometimes occur. Call your health care provider if you have any problems or questions after your procedure. What can I expect after the procedure? After the procedure, it is common to have:  Pain and tenderness.  Stiffness.  Swelling and bruising at the incision site.  Nausea.  Dizziness, weakness, and drowsiness. This may last for up to 24 hours after you have anesthetic.  Trouble walking, or you walk with a limp. Follow these instructions at home: Activity   Return to your normal activities as directed by your health care provider. Ask your health care provider what activities are safe for you.  Do not drive or operate heavy machinery while taking pain medicine. Ask your health care provider when it is safe for you to drive again.  Do not lift anything that is heavier than 10 lb (4.5 kg) until your health care provider says that you can.  Do not play contact sports until your health care provider says that you can.  Do physical therapy exercises as recommended by your health care provider. Incision care   Do not take baths, swim, or use a hot tub until your health care provider approves. You may take showers.  Wear compression stockings as directed by your health care provider. These stockings help to prevent blood clots and reduce swelling in your legs.  There are many different ways to close and cover an incision, including stitches (sutures), skin glue, and adhesive strips. Follow your health care provider's instructions about:  Incision care.  Bandage (dressing) changes and removal.  Incision closure removal.  Check your incision area every day for  signs of infection. Watch for:  Redness, swelling, or pain.  Fluid, blood, or pus. General instructions   Take medicines only as directed by your health care provider.  Use crutches as directed by your health care provider.  Keep all follow-up visits as directed by your health care provider. This is important. Contact a health care provider if:  You have persistent dizziness or nausea.  You vomit.  You develop a fever.  You have a cough.  You have redness, swelling, or pain at the site of your incision or in the joint.  You have fluid, blood, or pus coming from your incision.  You have pain that is getting worse.  There is yellowish-white fluid (pus) coming from your incision.  There is a bad smell coming from your incision or dressing.  The edges of your incision come apart after the tape or sutures have been removed.  You feel light-headed or faint.  Your joint soreness gets worse.  Your leg starts to get numb. Get help right away if:  You develop a rash.  You have trouble eating or drinking.  You develop swelling in your calf or your leg.  You have chest pain.  You have shortness of breath. This information is not intended to replace advice given to you by your health care provider. Make sure you discuss any questions you have with your health care provider. Document Released: 07/18/2007 Document Revised: 12/04/2015 Document Reviewed: 02/19/2014 Elsevier Interactive Patient Education  2017 [employer].

## 2016-11-20 NOTE — Care Management Note (Addendum)
Case Management Note  Patient Details  Name: MERILYN PAGAN MRN: [id number] Date of Birth: [birth year]  Subjective/Objective:         Discussed discharge planning with Mrs Donna. Mrs Winkels has a RW at home, family has already picked up her Lovenox from her pharmacy, and she will receive PT and OT at Dr Thurston Hole office. No other discharge needs identified.            Action/Plan:   Expected Discharge Date:  11/20/16               Expected Discharge Plan:  Home/Self Care  In-House Referral:     Discharge planning Services  CM Consult  Post Acute Care Choice:   Dr Drue Stager office  Choice offered to:   patient  DME Arranged:   has RW DME Agency:   NA  HH Arranged:   Dr Holley Dexter office for OP-PT.  [place] Agency:   Dr Holley Dexter office  Status of Service:  Completed, signed off  If discussed at [place] of Stay Meetings, dates discussed:    Additional Comments:  Archibald Marchetta A, RN 11/20/2016, 10:45 AM

## 2016-11-20 NOTE — Progress Notes (Signed)
Subjective:  POD #2 s/p right THA.  Patient reports pain as mild.  Patient was able to nurse's station today with physical therapy.  Patient with tachycardia. Her hemoglobin is 7.8 today.  Objective:   VITALS:   Vitals:   11/19/16 1541 11/20/16 0054 11/20/16 0055 11/20/16 0730  BP: 140/71 (!) 116/52  (!) 129/52  Pulse: 99 (!) 106 (!) 104 99  Resp: 18 16  16   Temp:  99.7 F (37.6 C)  99.6 F (37.6 C)  TempSrc:  Oral  Oral  SpO2: 99% 90% 94% 93%  Weight:      Height:        PHYSICAL EXAM:  Right lower extremity: Patient's dressing changed by me today.   Incision C/D/I. Neurovascular intact Sensation intact distally Intact pulses distally Dorsiflexion/Plantar flexion intact Incision: scant drainage No cellulitis present Compartment soft  LABS  Results for orders placed or performed during the hospital encounter of 11/18/16 (from the past 24 hour(s))  CBC     Status: Abnormal   Collection Time: 11/20/16  4:06 AM  Result Value Ref Range   WBC 9.0 3.6 - 11.0 K/uL   RBC 2.77 (L) 3.80 - 5.20 MIL/uL   Hemoglobin 7.8 (L) 12.0 - 16.0 g/dL   HCT 23.4 (L) 35.0 - 47.0 %   MCV 84.4 80.0 - 100.0 fL   MCH 28.2 26.0 - 34.0 pg   MCHC 33.5 32.0 - 36.0 g/dL   RDW 22.2 (H) 11.5 - 14.5 %   Platelets 113 (L) 150 - 440 K/uL  Glucose, capillary     Status: Abnormal   Collection Time: 11/20/16  7:19 AM  Result Value Ref Range   Glucose-Capillary 136 (H) 65 - 99 mg/dL   Comment 1 Notify RN    Comment 2 Document in Chart     Dg Pelvis Portable  Result Date: 11/18/2016 CLINICAL DATA:  Right hip osteoarthritis EXAM: PORTABLE PELVIS 1-2 VIEWS COMPARISON:  11/18/2016 FINDINGS: Changes of right hip replacement noted. Normal AP alignment. No hardware complicating feature. Moderate to advanced degenerative changes in the left hip. IMPRESSION: Right hip replacement.  No visible complicating feature. Electronically Signed   By: Rolm Baptise M.D.   On: 11/18/2016 10:17   Dg Pelvis  Portable  Result Date: 11/18/2016 CLINICAL DATA:  Right hip osteoarthritis. EXAM: PORTABLE PELVIS 1-2 VIEWS COMPARISON:  01/27/2016 FINDINGS: A single portable intraoperative radiograph of the pelvis is provided. Sequelae of interval right total hip arthroplasty are identified. The prosthetic components are approximated with one another on this single projection. Postoperative gas and a surgical drain are partially visualized about the right hip. Moderate left hip osteoarthrosis is noted. IMPRESSION: Right total hip arthroplasty as above. Electronically Signed   By: Logan Bores M.D.   On: 11/18/2016 09:56   Dg Hip Port Unilat With Pelvis 1v Right  Result Date: 11/18/2016 CLINICAL DATA:  Recent right hip replacement EXAM: DG HIP (WITH OR WITHOUT PELVIS) 1V PORT RIGHT COMPARISON:  None. FINDINGS: Pelvic ring is intact. Degenerative changes of left hip are seen. Right hip replacement is noted in satisfactory position. No acute bony abnormality is noted. IMPRESSION: Status post right hip replacement Electronically Signed   By: Inez Catalina M.D.   On: 11/18/2016 11:55    Assessment/Plan: 2 Days Post-Op   Active Problems:   Status post total hip replacement, right  Patient is being ordered for a transfusion of 1 unit of packed red blood cells.  She will have her hemoglobin checked following  this transfusion. Patient is otherwise doing well clinically may be discharged later today if her hemoglobin improves. Patient will follow up with me in the office in 10-14 days. She is scheduled to begin outpatient physical therapy in our office next Tuesday. Patient will remain on Lovenox 40 mg a day for DVT prophylaxis.    Thornton Park , MD 11/20/2016, 9:22 AM

## 2016-11-20 NOTE — Progress Notes (Signed)
Physical Therapy Treatment Patient Details Name: Janet Rice MRN: [id number] DOB: May 14, 1946 Today's Date: 11/20/2016    History of Present Illness Pt. is a 71 y.o. who was admitted to [facility] for a Right THR.    PT Comments    Participated in exercises as described below.  Out of bed with increased time but no physical assist.  She was able to ambulate around nursing stating and up/down 4 steps with min guard/supervision.  Pt stated she has 2 steps to enter her home without rails but uses wall and door for support with husband.  She stated she feels comfortable with set up and declined further training at this time.  She took a short standing rest break after stairs leaning against mat table.  To bathroom upon return to room with independent self care.  She reported some fatigue with gait but overall did well.     Follow Up Recommendations  Home health PT     Equipment Recommendations  None recommended by PT    Recommendations for Other Services       Precautions / Restrictions Precautions Precautions: Fall;Posterior Hip Restrictions Weight Bearing Restrictions: Yes RLE Weight Bearing: Weight bearing as tolerated    Mobility  Bed Mobility Overal bed mobility: Needs Assistance Bed Mobility: Sit to Supine     Supine to sit: Modified independent (Device/Increase time)     General bed mobility comments: safe technique with cues for sequencing. Follows commands well  Transfers   Equipment used: Rolling walker (2 wheeled) Transfers: Sit to/from Stand Sit to Stand: Supervision         General transfer comment: safe technique noted with ability to maintain correct hip precautions  Ambulation/Gait Ambulation/Gait assistance: Min guard Ambulation Distance (Feet): 250 Feet Assistive device: Rolling walker (2 wheeled) Gait Pattern/deviations: Step-through pattern   Gait velocity interpretation: <1.8 ft/sec, indicative of risk for recurrent falls General Gait  Details: Good recipricol gait pattern, verbal cues to keep walker further in front of her but overall safe ambulation.     Stairs            Wheelchair Mobility    Modified Rankin (Stroke Patients Only)       Balance Overall balance assessment: Needs assistance Sitting-balance support: Feet supported       Standing balance support: Bilateral upper extremity supported Standing balance-Leahy Scale: Good                              Cognition Arousal/Alertness: Awake/alert Behavior During Therapy: WFL for tasks assessed/performed Overall Cognitive Status: Within Functional Limits for tasks assessed                                        Exercises Other Exercises Other Exercises: supine a/AAROM x 12 for ankle pumps, heel slides, ab/add, quad and glut sets Other Exercises: ambulated to bathroom with cga and safe technique. Pt able to urinate and perform hygiene with cga.    General Comments        Pertinent Vitals/Pain Pain Assessment: 0-10 Pain Score: 3  Pain Location: R hip Pain Descriptors / Indicators: Sore Pain Intervention(s): Limited activity within patient's tolerance    Home Living                      Prior Function  PT Goals (current goals can now be found in the care plan section) Progress towards PT goals: Progressing toward goals    Frequency    BID      PT Plan Current plan remains appropriate    Co-evaluation              AM-PAC PT "6 Clicks" Daily Activity  Outcome Measure  Difficulty turning over in bed (including adjusting bedclothes, sheets and blankets)?: A Little Difficulty moving from lying on back to sitting on the side of the bed? : A Little Difficulty sitting down on and standing up from a chair with arms (e.g., wheelchair, bedside commode, etc,.)?: A Little Help needed moving to and from a bed to chair (including a wheelchair)?: A Little Help needed walking in  hospital room?: A Little Help needed climbing 3-5 steps with a railing? : A Little 6 Click Score: 18    End of Session Equipment Utilized During Treatment: Gait belt Activity Tolerance: Patient tolerated treatment well Patient left: in chair;with chair alarm set;with call bell/phone within reach   Pain - Right/Left: Right Pain - part of body: Hip     Time: 0902-0926 PT Time Calculation (min) (ACUTE ONLY): 24 min  Charges:  $Gait Training: 8-22 mins $Therapeutic Exercise: 8-22 mins                    G Codes:       Chesley Noon, PTA 11/20/16, 10:12 AM

## 2016-11-21 LAB — TYPE AND SCREEN
ABO/RH(D): AB NEG
Antibody Screen: NEGATIVE
Unit division: 0

## 2016-11-21 LAB — BPAM RBC
Blood Product Expiration Date: 201805182359
ISSUE DATE / TIME: 201805121110
Unit Type and Rh: 600

## 2016-11-22 LAB — SURGICAL PATHOLOGY

## 2016-11-23 DIAGNOSIS — M25551 Pain in right hip: Secondary | ICD-10-CM | POA: Diagnosis not present

## 2016-11-23 DIAGNOSIS — R2689 Other abnormalities of gait and mobility: Secondary | ICD-10-CM | POA: Diagnosis not present

## 2016-11-29 DIAGNOSIS — R2689 Other abnormalities of gait and mobility: Secondary | ICD-10-CM | POA: Diagnosis not present

## 2016-11-29 DIAGNOSIS — M25551 Pain in right hip: Secondary | ICD-10-CM | POA: Diagnosis not present

## 2016-12-03 NOTE — Discharge Summary (Signed)
Physician Discharge Summary  Patient ID: Janet Rice MRN: [id number] DOB/AGE: 10-Oct-1945 71 y.o.  Admit date: 11/18/2016 Discharge date: 12/03/2016  Admission Diagnoses:  M16.11 Unilateral primary osteoarthritis, right hip <principal problem not specified>  Discharge Diagnoses:  M16.11 Unilateral primary osteoarthritis, right hip Active Problems:   Status post total hip replacement, right   Past Medical History:  Diagnosis Date  . Adenomatous colon polyp   . Arthritis   . Childhood asthma    AS A CHILD  . Chronic heartburn    On omeprazole  . Complication of anesthesia    HARD TO WAKE UP  . External hemorrhoids   . Frequent loose stools   . GERD (gastroesophageal reflux disease)   . Heme positive stool   . Hyperlipidemia   . Hypothyroidism   . Iron deficiency anemia   . Metastatic melanoma ([place])    Followed by Dr Oliva Bustard, previous chemo, no recurrence, 2008?    Surgeries: Procedure(s): TOTAL HIP ARTHROPLASTY on 11/18/2016   Consultants (if any):   Discharged Condition: Improved  Hospital Course: Janet Rice is an 71 y.o. female who was admitted 11/18/2016 with a diagnosis of  M16.11 Unilateral primary osteoarthritis, right hip <principal problem not specified> and went to the operating room on 11/18/2016 and underwent An uncomplicated right total hip arthroplasty.  She was admitted to the orthopedic floor postoperatively. She completed 24 hours of postop antibiotics. Her Foley catheter was removed on postop day 1. Patient was seen and evaluated by physical therapy beginning on postop day 1. She made excellent progress. On postoperative day #2 she was found to have a hemoglobin of 7.8 and was transfused 1 unit of PRBCs. Patient was then discharged home later that day as she was making excellent clinical progress.  She was given perioperative antibiotics:  Anti-infectives    Start     Dose/Rate Route Frequency Ordered Stop   11/18/16 1600  clindamycin (CLEOCIN)  IVPB 900 mg     900 mg 100 mL/hr over 30 Minutes Intravenous Every 8 hours 11/18/16 1230 11/19/16 0030   11/18/16 1600  ceFAZolin (ANCEF) 2 g in dextrose 5 % 100 mL IVPB     2 g 240 mL/hr over 30 Minutes Intravenous Every 8 hours 11/18/16 1246 11/18/16 2354   11/18/16 1245  ceFAZolin (ANCEF) IVPB 2g/100 mL premix  Status:  Discontinued     2 g 200 mL/hr over 30 Minutes Intravenous Every 6 hours 11/18/16 1230 11/18/16 1246   11/18/16 0555  clindamycin (CLEOCIN) 600 MG/50ML IVPB    Comments:  Slemenda, Debbie: cabinet override      11/18/16 0555 11/18/16 0750   11/18/16 0555  ceFAZolin (ANCEF) 2-4 GM/100ML-% IVPB    Comments:  Slemenda, Debbie: cabinet override      11/18/16 0555 11/18/16 0807   11/18/16 0030  clindamycin (CLEOCIN) IVPB 600 mg     600 mg 100 mL/hr over 30 Minutes Intravenous  Once 11/18/16 0029 11/18/16 0805   11/18/16 0029  ceFAZolin (ANCEF) IVPB 2g/100 mL premix     2 g 200 mL/hr over 30 Minutes Intravenous On call to O.R. 11/18/16 2229 11/18/16 7989    .  She was given sequential compression devices, early ambulation, and Lovenox for DVT prophylaxis.  She benefited maximally from the hospital stay and there were no complications.    Recent vital signs:  Vitals:   11/20/16 1140 11/20/16 1404  BP: (!) 99/48 (!) 113/56  Pulse: 84 88  Resp: 18 18  Temp: 98.9  F (37.2 C) 98.7 F (37.1 C)    Recent laboratory studies:  Lab Results  Component Value Date   HGB 8.3 (L) 11/20/2016   HGB 7.8 (L) 11/20/2016   HGB 9.0 (L) 11/19/2016   Lab Results  Component Value Date   WBC 9.5 11/20/2016   PLT 121 (L) 11/20/2016   Lab Results  Component Value Date   INR 0.93 11/03/2016   Lab Results  Component Value Date   NA 140 11/19/2016   K 4.4 11/19/2016   CL 104 11/19/2016   CO2 32 11/19/2016   BUN 14 11/19/2016   CREATININE 0.91 11/19/2016   GLUCOSE 106 (H) 11/19/2016    Discharge Medications:   Allergies as of 11/20/2016      Reactions   Sulfa  Antibiotics Rash      Medication List    TAKE these medications   acetaminophen 500 MG tablet Commonly known as:  TYLENOL Take 500-1,000 mg by mouth every 6 (six) hours as needed (for active day to prevent pain.).   b complex vitamins tablet Take 1 tablet by mouth daily with lunch.   CALCIUM 600+D 600-800 MG-UNIT Tabs Generic drug:  Calcium Carb-Cholecalciferol Take 1 tablet by mouth daily with lunch.   celecoxib 200 MG capsule Commonly known as:  CELEBREX Take 200 mg by mouth daily.   citalopram 20 MG tablet Commonly known as:  CELEXA Take 1 tablet (20 mg total) by mouth daily. What changed:  when to take this   CoQ10 50 MG Caps Take 50 mg by mouth daily with lunch.   DUREZOL 0.05 % Emul Generic drug:  Difluprednate Place 1 drop into the right eye 4 (four) times daily.   EYE VITAMINS & MINERALS Tabs Take 1 tablet by mouth daily.   levothyroxine 125 MCG tablet Commonly known as:  SYNTHROID, LEVOTHROID Take 1 tablet (125 mcg total) by mouth daily before breakfast.   loperamide 2 MG capsule Commonly known as:  IMODIUM Take 2 mg by mouth every morning.   multivitamin with minerals Tabs tablet Take 1 tablet by mouth daily with lunch.   omeprazole 20 MG capsule Commonly known as:  PRILOSEC Take 1 capsule (20 mg total) by mouth daily. What changed:  when to take this   oxyCODONE 5 MG immediate release tablet Commonly known as:  Oxy IR/ROXICODONE Take 1-2 tablets (5-10 mg total) by mouth every 4 (four) hours as needed for moderate pain.   pravastatin 40 MG tablet Commonly known as:  PRAVACHOL Take 0.5 tablets (20 mg total) by mouth daily. What changed:  when to take this   PROBIOTIC PO Take 1 capsule by mouth daily with breakfast.   SLOW RELEASE IRON 45 MG Tbcr Generic drug:  Ferrous Sulfate Dried Take 45 mg by mouth daily with lunch.   SYSTANE ULTRA OP Place 1-2 drops into both eyes 3 (three) times daily as needed (for dry eye).   TURMERIC PO Take 1  capsule by mouth daily at 12 noon.       Diagnostic Studies: Dg Pelvis Portable  Result Date: 11/18/2016 CLINICAL DATA:  Right hip osteoarthritis EXAM: PORTABLE PELVIS 1-2 VIEWS COMPARISON:  11/18/2016 FINDINGS: Changes of right hip replacement noted. Normal AP alignment. No hardware complicating feature. Moderate to advanced degenerative changes in the left hip. IMPRESSION: Right hip replacement.  No visible complicating feature. Electronically Signed   By: Rolm Baptise M.D.   On: 11/18/2016 10:17   Dg Pelvis Portable  Result Date: 11/18/2016 CLINICAL DATA:  Right hip osteoarthritis. EXAM: PORTABLE PELVIS 1-2 VIEWS COMPARISON:  01/27/2016 FINDINGS: A single portable intraoperative radiograph of the pelvis is provided. Sequelae of interval right total hip arthroplasty are identified. The prosthetic components are approximated with one another on this single projection. Postoperative gas and a surgical drain are partially visualized about the right hip. Moderate left hip osteoarthrosis is noted. IMPRESSION: Right total hip arthroplasty as above. Electronically Signed   By: Logan Bores M.D.   On: 11/18/2016 09:56   Dg Hip Port Unilat With Pelvis 1v Right  Result Date: 11/18/2016 CLINICAL DATA:  Recent right hip replacement EXAM: DG HIP (WITH OR WITHOUT PELVIS) 1V PORT RIGHT COMPARISON:  None. FINDINGS: Pelvic ring is intact. Degenerative changes of left hip are seen. Right hip replacement is noted in satisfactory position. No acute bony abnormality is noted. IMPRESSION: Status post right hip replacement Electronically Signed   By: Inez Catalina M.D.   On: 11/18/2016 11:55    Disposition: 01-Home or Self Care  Discharge Instructions    Call MD / Call 911    Complete by:  As directed    If you experience chest pain or shortness of breath, CALL 911 and be transported to the hospital emergency room.  If you develope a fever above 101 F, pus (white drainage) or increased drainage or redness at the  wound, or calf pain, call your surgeon's office.   Constipation Prevention    Complete by:  As directed    Drink plenty of fluids.  Prune juice may be helpful.  You may use a stool softener, such as Colace (over the counter) 100 mg twice a day.  Use MiraLax (over the counter) for constipation as needed.   Diet general    Complete by:  As directed    Discharge instructions    Complete by:  As directed    Patient will be discharged home on posterior hip precautions. Patient may place her full weight on the operative extremity as her pain allows. She will use a walker for assistance with ambulation. Patient will continue TED stockings until follow-up in the office. Patient has an appointment for outpatient physical therapy is our office on Tuesday for gait training, lower extremity strengthening and hip range of motion.  Continue lovenox 40 mg once daily for DVT prophylaxis.   Driving restrictions    Complete by:  As directed    No driving for 6-8 weeks   Increase activity slowly as tolerated    Complete by:  As directed    Lifting restrictions    Complete by:  As directed    No lifting for 12 weeks      Follow-up Information    Thornton Park, MD. Schedule an appointment as soon as possible for a visit in 7 day(s).   Specialty:  Orthopedic Surgery Contact information: [place] [address] [phone]        Brunetta Genera, MD .   Specialty:  Psychiatry Contact information: [place] [address] [phone]            Signed: Thornton Park ,MD 12/03/2016, 2:41 PM

## 2016-12-05 ENCOUNTER — Other Ambulatory Visit: Payer: Self-pay | Admitting: Family Medicine

## 2016-12-07 DIAGNOSIS — R2689 Other abnormalities of gait and mobility: Secondary | ICD-10-CM | POA: Diagnosis not present

## 2016-12-07 DIAGNOSIS — R609 Edema, unspecified: Secondary | ICD-10-CM | POA: Diagnosis not present

## 2016-12-07 DIAGNOSIS — M25551 Pain in right hip: Secondary | ICD-10-CM | POA: Diagnosis not present

## 2016-12-10 ENCOUNTER — Ambulatory Visit: Payer: Medicare Other

## 2016-12-15 DIAGNOSIS — M25551 Pain in right hip: Secondary | ICD-10-CM | POA: Diagnosis not present

## 2016-12-15 DIAGNOSIS — R2689 Other abnormalities of gait and mobility: Secondary | ICD-10-CM | POA: Diagnosis not present

## 2016-12-20 DIAGNOSIS — H35371 Puckering of macula, right eye: Secondary | ICD-10-CM | POA: Diagnosis not present

## 2016-12-21 DIAGNOSIS — M25551 Pain in right hip: Secondary | ICD-10-CM | POA: Diagnosis not present

## 2016-12-21 DIAGNOSIS — R2689 Other abnormalities of gait and mobility: Secondary | ICD-10-CM | POA: Diagnosis not present

## 2016-12-22 ENCOUNTER — Telehealth: Payer: Self-pay | Admitting: Family Medicine

## 2016-12-22 NOTE — Telephone Encounter (Signed)
Spoke to pt to try and reschedule AWV. Pt would like to call back. She has a lot going on right now.

## 2016-12-27 DIAGNOSIS — M25551 Pain in right hip: Secondary | ICD-10-CM | POA: Diagnosis not present

## 2016-12-27 DIAGNOSIS — D508 Other iron deficiency anemias: Secondary | ICD-10-CM | POA: Diagnosis not present

## 2016-12-28 ENCOUNTER — Inpatient Hospital Stay: Payer: Medicare Other | Admitting: Oncology

## 2016-12-28 ENCOUNTER — Inpatient Hospital Stay: Payer: Medicare Other

## 2016-12-29 ENCOUNTER — Inpatient Hospital Stay: Payer: Medicare Other | Attending: Oncology | Admitting: Oncology

## 2016-12-29 ENCOUNTER — Inpatient Hospital Stay: Payer: Medicare Other

## 2016-12-29 VITALS — BP 145/77 | HR 96 | Temp 98.5°F | Resp 20 | Wt 211.6 lb

## 2016-12-29 DIAGNOSIS — E039 Hypothyroidism, unspecified: Secondary | ICD-10-CM | POA: Diagnosis not present

## 2016-12-29 DIAGNOSIS — Z86010 Personal history of colonic polyps: Secondary | ICD-10-CM | POA: Insufficient documentation

## 2016-12-29 DIAGNOSIS — I10 Essential (primary) hypertension: Secondary | ICD-10-CM | POA: Insufficient documentation

## 2016-12-29 DIAGNOSIS — E785 Hyperlipidemia, unspecified: Secondary | ICD-10-CM | POA: Insufficient documentation

## 2016-12-29 DIAGNOSIS — Z85820 Personal history of malignant melanoma of skin: Secondary | ICD-10-CM | POA: Diagnosis not present

## 2016-12-29 DIAGNOSIS — Z79899 Other long term (current) drug therapy: Secondary | ICD-10-CM | POA: Diagnosis not present

## 2016-12-29 DIAGNOSIS — D508 Other iron deficiency anemias: Secondary | ICD-10-CM

## 2016-12-29 DIAGNOSIS — D509 Iron deficiency anemia, unspecified: Secondary | ICD-10-CM

## 2016-12-29 DIAGNOSIS — Z87891 Personal history of nicotine dependence: Secondary | ICD-10-CM | POA: Insufficient documentation

## 2016-12-29 DIAGNOSIS — K219 Gastro-esophageal reflux disease without esophagitis: Secondary | ICD-10-CM | POA: Diagnosis not present

## 2016-12-29 MED ORDER — SODIUM CHLORIDE 0.9 % IV SOLN
510.0000 mg | Freq: Once | INTRAVENOUS | Status: AC
  Administered 2016-12-29: 510 mg via INTRAVENOUS
  Filled 2016-12-29: qty 17

## 2016-12-29 MED ORDER — SODIUM CHLORIDE 0.9 % IV SOLN
Freq: Once | INTRAVENOUS | Status: AC
  Administered 2016-12-29: 14:00:00 via INTRAVENOUS
  Filled 2016-12-29: qty 1000

## 2016-12-29 NOTE — Progress Notes (Signed)
[place]  [phone]  Fax:([phone]) [place] DOB: [birth year]  MR#: [id number]  VZD#:[id number]  Patient Care Team: [name], DO as PCP - General (Family Medicine)  CHIEF COMPLAINT: Iron deficiency anemia.  INTERVAL HISTORY:  Patient returns to clinic for further evaluation and consideration of additional IV Feraheme. She continues to have chronic mild weakness and fatigue, but otherwise feels well. She is having hip pain and plans to have hip replacement surgery in the next several weeks. She has no neurologic complaints. She denies any recent fevers or illnesses. She has a good appetite and denies weight loss. She denies any chest pain or shortness of breath. She denies any nausea, vomiting, constipation, or diarrhea. She has no melena or hematochezia. She has no urinary complaints. Patient feels at her baseline and offers no specific complaints today.   REVIEW OF SYSTEMS:   Review of Systems  Constitutional: Positive for malaise/fatigue. Negative for fever and weight loss.  Eyes: Negative.   Respiratory: Negative.  Negative for cough and shortness of breath.   Cardiovascular: Negative for chest pain and leg swelling.  Gastrointestinal: Negative.  Negative for abdominal pain, blood in stool and melena.  Genitourinary: Negative.   Musculoskeletal: Positive for back pain and joint pain.  Skin: Negative.  Negative for rash.  Neurological: Negative.  Negative for weakness.  Psychiatric/Behavioral: Negative.  The patient is not nervous/anxious.     As per HPI. Otherwise, a complete review of systems is negative.  ONCOLOGY HISTORY:  No history exists.    PAST MEDICAL HISTORY: Past Medical History:  Diagnosis Date  . Adenomatous colon polyp   . Arthritis   . Childhood asthma    AS A CHILD  . Chronic heartburn    On omeprazole  . Complication of anesthesia    HARD TO WAKE UP  . External hemorrhoids   . Frequent loose stools    . GERD (gastroesophageal reflux disease)   . Heme positive stool   . Hyperlipidemia   . Hypothyroidism   . Iron deficiency anemia   . Metastatic melanoma ([place])    Followed by Dr Oliva Bustard, previous chemo, no recurrence, 2008?    PAST SURGICAL HISTORY: Past Surgical History:  Procedure Laterality Date  . ABDOMINAL HYSTERECTOMY  1984  . BACK SURGERY  03/2016   LUMBAR  . CARPAL TUNNEL RELEASE Right 09/07/2016   Procedure: CARPAL TUNNEL RELEASE;  Surgeon: Thornton Park, MD;  Location: [facility] ORS;  Service: Orthopedics;  Laterality: Right;  . CHOLECYSTECTOMY  1990  . COLONOSCOPY  08/2006   Four sessile polyps found and removed in sigmoid colon at splenic flexure and ascending colon. 7 mm in size. Another removed from transverse colon 4 mm. Path Report - showed tubular adenoma and hyperplastic polyp. Advised to repeat in 3.5 years (02/2010)  . COLONOSCOPY  3.2.2011   8 mm polyp in sigmoid colon, removed, 4 mm polyp in descending colon, removed. Internal hemorrhoids  . ESOPHAGOGASTRODUODENOSCOPY  3.2.2011   Large hiatia hernia, esophagus normal, mulitple small sessile polyps w/no stigmata of recent bleeding found. Mildy erythermatous mucosa w/no bleeding found in gatric antrum. Normal duodenum. 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 PATH - no active inflammation, antral mucosa w/mild foveolar hyperplasia  . FRACTURE SURGERY    . Lymph Node Removal  12/2005   Left inguinal lymph node dissection  . MELANOMA EXCISION Left 1993   Left thigh  . ORIF ANKLE FRACTURE Right  Dr. Mack Guise  . TOTAL HIP ARTHROPLASTY Right 11/18/2016   Procedure: TOTAL HIP ARTHROPLASTY;  Surgeon: Thornton Park, MD;  Location: [facility] ORS;  Service: Orthopedics;  Laterality: Right;    FAMILY HISTORY Family History  Problem Relation Age of Onset  . Aneurysm Mother   . Heart disease Mother   . Colon polyps Father   . Diabetes Father   . Leukemia Maternal Grandfather   . Skin cancer Paternal  Grandfather   . Diabetes Other   . Colon polyps Other   . Colon cancer Neg Hx   . Breast cancer Neg Hx     GYNECOLOGIC HISTORY:  No LMP recorded. Patient has had a hysterectomy.     ADVANCED DIRECTIVES:    HEALTH MAINTENANCE: Social History  Substance Use Topics  . Smoking status: Former Smoker    Packs/day: 0.50    Years: 8.00    Types: Cigarettes    Quit date: 07/12/1978  . Smokeless tobacco: Never Used  . Alcohol use No    Allergies  Allergen Reactions  . Sulfa Antibiotics Rash    Current Outpatient Prescriptions  Medication Sig Dispense Refill  . acetaminophen (TYLENOL) 500 MG tablet Take 500-1,000 mg by mouth every 6 (six) hours as needed (for active day to prevent pain.).    Marland Kitchen b complex vitamins tablet Take 1 tablet by mouth daily with lunch.    . Calcium Carb-Cholecalciferol (CALCIUM 600+D) 600-800 MG-UNIT TABS Take 1 tablet by mouth daily with lunch.    . citalopram (CELEXA) 20 MG tablet TAKE 1 TABLET DAILY 90 tablet 2  . Coenzyme Q10 (COQ10) 50 MG CAPS Take 50 mg by mouth daily with lunch.    . DUREZOL 0.05 % EMUL Place 1 drop into the right eye 4 (four) times daily.    . Ferrous Sulfate Dried (SLOW RELEASE IRON) 45 MG TBCR Take 45 mg by mouth daily with lunch.    . levothyroxine (SYNTHROID, LEVOTHROID) 125 MCG tablet Take 1 tablet (125 mcg total) by mouth daily before breakfast. 90 tablet 3  . loperamide (IMODIUM) 2 MG capsule Take 2 mg by mouth every morning.    . Multiple Vitamin (MULTIVITAMIN WITH MINERALS) TABS tablet Take 1 tablet by mouth daily with lunch.    . Multiple Vitamins-Minerals (EYE VITAMINS & MINERALS) TABS Take 1 tablet by mouth daily.     Marland Kitchen omeprazole (PRILOSEC) 20 MG capsule Take 1 capsule (20 mg total) by mouth daily. (Patient taking differently: Take 20 mg by mouth at bedtime. ) 90 capsule 3  . oxyCODONE (OXY IR/ROXICODONE) 5 MG immediate release tablet Take 1-2 tablets (5-10 mg total) by mouth every 4 (four) hours as needed for moderate pain.  40 tablet 0  . Polyethyl Glycol-Propyl Glycol (SYSTANE ULTRA OP) Place 1-2 drops into both eyes 3 (three) times daily as needed (for dry eye).    . pravastatin (PRAVACHOL) 40 MG tablet Take 0.5 tablets (20 mg total) by mouth daily. (Patient taking differently: Take 20 mg by mouth at bedtime. ) 45 tablet 3  . Probiotic Product (PROBIOTIC PO) Take 1 capsule by mouth daily with breakfast.    . TURMERIC PO Take 1 capsule by mouth daily at 12 noon.    . celecoxib (CELEBREX) 200 MG capsule Take 200 mg by mouth daily.      No current facility-administered medications for this visit.     OBJECTIVE: BP (!) 145/77   Pulse 96   Temp 98.5 F (36.9 C) (Tympanic)   Resp 20  Wt 211 lb 9.6 oz (96 kg)   BMI 38.70 kg/m    Body mass index is 38.7 kg/m.    ECOG FS:0 - Asymptomatic  General: Well-developed, well-nourished, no acute distress. Eyes: Pink conjunctiva, anicteric sclera. Lungs: Clear to auscultation bilaterally. Heart: Regular rate and rhythm. No rubs, murmurs, or gallops. Abdomen: Soft, nontender, nondistended. No organomegaly noted, normoactive bowel sounds. Musculoskeletal: No edema, cyanosis, or clubbing. Neuro: Alert, answering all questions appropriately. Cranial nerves grossly intact. Skin: No rashes or petechiae noted. Psych: Normal affect.   LAB RESULTS:  December 27, 2016: WBC 6.6, hemoglobin 10.4, platelet 144. Total iron 20, iron saturation 7%, ferritin 55.   STUDIES: No results found.  ASSESSMENT & PLAN:    1. Iron deficiency anemia: Patient reportedly had a normal colonoscopy in March 2016. Patient's hemoglobin and iron stores have trended down, therefore will proceed with 510 mg IV Feraheme today. Return to clinic in 1 week for a second infusion. Patient will then return to clinic in 4 months with repeat laboratory work, further evaluation, and consideration of additional IV iron. Patient [facility].  2. History of melanoma: Melanoma of left  thigh status post resection in 1993, exact stage is not known, metastatic to inguinal lymph node. No evidence of recurrent disease. 3. Back Pain: Patient had surgery in September 2017.  4. Hypertension: Patient's blood pressure is mildly elevated today, monitor. 5. Hip pain: Proceed with hip replacement surgery as planned.  Patient expressed understanding and was in agreement with this plan. She also understands that She can call clinic at any time with any questions, concerns, or complaints.    Lloyd Huger, MD   01/01/2017 7:23 AM

## 2016-12-29 NOTE — Progress Notes (Signed)
Patient denies any concerns today.  

## 2017-01-05 ENCOUNTER — Inpatient Hospital Stay: Payer: Medicare Other

## 2017-01-05 VITALS — BP 124/77 | HR 89 | Temp 96.6°F | Resp 20

## 2017-01-05 DIAGNOSIS — E785 Hyperlipidemia, unspecified: Secondary | ICD-10-CM | POA: Diagnosis not present

## 2017-01-05 DIAGNOSIS — Z79899 Other long term (current) drug therapy: Secondary | ICD-10-CM | POA: Diagnosis not present

## 2017-01-05 DIAGNOSIS — I10 Essential (primary) hypertension: Secondary | ICD-10-CM | POA: Diagnosis not present

## 2017-01-05 DIAGNOSIS — D509 Iron deficiency anemia, unspecified: Secondary | ICD-10-CM | POA: Diagnosis not present

## 2017-01-05 DIAGNOSIS — K219 Gastro-esophageal reflux disease without esophagitis: Secondary | ICD-10-CM | POA: Diagnosis not present

## 2017-01-05 DIAGNOSIS — D508 Other iron deficiency anemias: Secondary | ICD-10-CM

## 2017-01-05 DIAGNOSIS — Z85820 Personal history of malignant melanoma of skin: Secondary | ICD-10-CM | POA: Diagnosis not present

## 2017-01-05 MED ORDER — SODIUM CHLORIDE 0.9 % IV SOLN
510.0000 mg | Freq: Once | INTRAVENOUS | Status: AC
  Administered 2017-01-05: 510 mg via INTRAVENOUS
  Filled 2017-01-05: qty 17

## 2017-01-05 MED ORDER — SODIUM CHLORIDE 0.9 % IV SOLN
INTRAVENOUS | Status: DC
  Administered 2017-01-05: 15:00:00 via INTRAVENOUS
  Filled 2017-01-05: qty 1000

## 2017-01-21 ENCOUNTER — Other Ambulatory Visit: Payer: Self-pay

## 2017-01-21 MED ORDER — LEVOTHYROXINE SODIUM 125 MCG PO TABS: 125.0000 ug | ORAL_TABLET | Freq: Every day | ORAL | 1 refills | Status: DC

## 2017-02-04 DIAGNOSIS — H35371 Puckering of macula, right eye: Secondary | ICD-10-CM | POA: Diagnosis not present

## 2017-02-04 DIAGNOSIS — H35351 Cystoid macular degeneration, right eye: Secondary | ICD-10-CM | POA: Diagnosis not present

## 2017-02-04 DIAGNOSIS — H538 Other visual disturbances: Secondary | ICD-10-CM | POA: Diagnosis not present

## 2017-02-04 DIAGNOSIS — H2513 Age-related nuclear cataract, bilateral: Secondary | ICD-10-CM | POA: Diagnosis not present

## 2017-02-08 DIAGNOSIS — M25551 Pain in right hip: Secondary | ICD-10-CM | POA: Diagnosis not present

## 2017-02-09 ENCOUNTER — Other Ambulatory Visit: Payer: Self-pay

## 2017-02-09 MED ORDER — OMEPRAZOLE 20 MG PO CPDR: 20.0000 mg | DELAYED_RELEASE_CAPSULE | Freq: Every day | ORAL | 3 refills | Status: DC

## 2017-02-09 MED ORDER — PRAVASTATIN SODIUM 40 MG PO TABS: 20.0000 mg | ORAL_TABLET | Freq: Every day | ORAL | 3 refills | Status: DC

## 2017-02-09 NOTE — Telephone Encounter (Signed)
Previously filled by Dr Gilford Rile Last office visit 08/31/16, labs 09/01/16 follow up prn

## 2017-02-15 DIAGNOSIS — Z6839 Body mass index (BMI) 39.0-39.9, adult: Secondary | ICD-10-CM | POA: Diagnosis not present

## 2017-02-15 DIAGNOSIS — M4316 Spondylolisthesis, lumbar region: Secondary | ICD-10-CM | POA: Diagnosis not present

## 2017-02-15 DIAGNOSIS — R030 Elevated blood-pressure reading, without diagnosis of hypertension: Secondary | ICD-10-CM | POA: Diagnosis not present

## 2017-03-18 DIAGNOSIS — H35371 Puckering of macula, right eye: Secondary | ICD-10-CM | POA: Diagnosis not present

## 2017-03-18 DIAGNOSIS — H04121 Dry eye syndrome of right lacrimal gland: Secondary | ICD-10-CM | POA: Diagnosis not present

## 2017-03-18 DIAGNOSIS — H35351 Cystoid macular degeneration, right eye: Secondary | ICD-10-CM | POA: Diagnosis not present

## 2017-04-27 DIAGNOSIS — D509 Iron deficiency anemia, unspecified: Secondary | ICD-10-CM | POA: Diagnosis not present

## 2017-05-02 DIAGNOSIS — H35351 Cystoid macular degeneration, right eye: Secondary | ICD-10-CM | POA: Diagnosis not present

## 2017-05-02 DIAGNOSIS — H35371 Puckering of macula, right eye: Secondary | ICD-10-CM | POA: Diagnosis not present

## 2017-05-03 NOTE — Progress Notes (Signed)
[place]  [phone]) [phone]  Fax:(336) [place] DOB: 09/17/1945  MR#: [id number]  KAJ#:[id number]  Patient Care Team: [name], DO as PCP - General (Family Medicine)  CHIEF COMPLAINT: Iron deficiency anemia.  INTERVAL HISTORY:  Patient returns to clinic for further evaluation and consideration of additional IV Feraheme. She currently feels well and is asymptomatic. She has no neurologic complaints. She denies any recent fevers or illnesses. She has a good appetite and denies weight loss. She denies any chest pain or shortness of breath. She denies any nausea, vomiting, constipation, or diarrhea. She has no melena or hematochezia. She has no urinary complaints. Patient feels at her baseline and offers no specific complaints today.   REVIEW OF SYSTEMS:   Review of Systems  Constitutional: Negative.  Negative for fever, malaise/fatigue and weight loss.  Eyes: Negative.   Respiratory: Negative.  Negative for cough and shortness of breath.   Cardiovascular: Negative for chest pain and leg swelling.  Gastrointestinal: Negative.  Negative for abdominal pain, blood in stool and melena.  Genitourinary: Negative.  Negative for hematuria.  Musculoskeletal: Positive for back pain and joint pain.  Skin: Negative.  Negative for rash.  Neurological: Negative.  Negative for weakness.  Psychiatric/Behavioral: Negative.  The patient is not nervous/anxious.     As per HPI. Otherwise, a complete review of systems is negative.  ONCOLOGY HISTORY:  No history exists.    PAST MEDICAL HISTORY: Past Medical History:  Diagnosis Date  . Adenomatous colon polyp   . Arthritis   . Childhood asthma    AS A CHILD  . Chronic heartburn    On omeprazole  . Complication of anesthesia    HARD TO WAKE UP  . External hemorrhoids   . Frequent loose stools   . GERD (gastroesophageal reflux disease)   . Heme positive stool   . Hyperlipidemia   .  Hypothyroidism   . Iron deficiency anemia   . Metastatic melanoma ([place])    Followed by Dr Oliva Bustard, previous chemo, no recurrence, 2008?    PAST SURGICAL HISTORY: Past Surgical History:  Procedure Laterality Date  . ABDOMINAL HYSTERECTOMY  1984  . BACK SURGERY  03/2016   LUMBAR  . CARPAL TUNNEL RELEASE Right 09/07/2016   Procedure: CARPAL TUNNEL RELEASE;  Surgeon: Thornton Park, MD;  Location: [facility] ORS;  Service: Orthopedics;  Laterality: Right;  . CHOLECYSTECTOMY  1990  . COLONOSCOPY  08/2006   Four sessile polyps found and removed in sigmoid colon at splenic flexure and ascending colon. 7 mm in size. Another removed from transverse colon 4 mm. Path Report - showed tubular adenoma and hyperplastic polyp. Advised to repeat in 3.5 years (02/2010)  . COLONOSCOPY  3.2.2011   8 mm polyp in sigmoid colon, removed, 4 mm polyp in descending colon, removed. Internal hemorrhoids  . ESOPHAGOGASTRODUODENOSCOPY  3.2.2011   Large hiatia hernia, esophagus normal, mulitple small sessile polyps w/no stigmata of recent bleeding found. Mildy erythermatous mucosa w/no bleeding found in gatric antrum. Normal duodenum. 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 PATH - no active inflammation, antral mucosa w/mild foveolar hyperplasia  . FRACTURE SURGERY    . Lymph Node Removal  12/2005   Left inguinal lymph node dissection  . MELANOMA EXCISION Left 1993   Left thigh  . ORIF ANKLE FRACTURE Right    Dr. Mack Guise  . TOTAL HIP ARTHROPLASTY Right 11/18/2016   Procedure: TOTAL HIP ARTHROPLASTY;  Surgeon: Thornton Park, MD;  Location: [facility] ORS;  Service: Orthopedics;  Laterality: Right;    FAMILY HISTORY Family History  Problem Relation Age of Onset  . Aneurysm Mother   . Heart disease Mother   . Colon polyps Father   . Diabetes Father   . Leukemia Maternal Grandfather   . Skin cancer Paternal Grandfather   . Diabetes Other   . Colon polyps Other   . Colon cancer Neg Hx   . Breast cancer Neg  Hx     GYNECOLOGIC HISTORY:  No LMP recorded. Patient has had a hysterectomy.     ADVANCED DIRECTIVES:    HEALTH MAINTENANCE: Social History  Substance Use Topics  . Smoking status: Former Smoker    Packs/day: 0.50    Years: 8.00    Types: Cigarettes    Quit date: 07/12/1978  . Smokeless tobacco: Never Used  . Alcohol use No    Allergies  Allergen Reactions  . Sulfa Antibiotics Rash    Current Outpatient Prescriptions  Medication Sig Dispense Refill  . acetaminophen (TYLENOL) 500 MG tablet Take 500-1,000 mg by mouth every 6 (six) hours as needed (for active day to prevent pain.).    Marland Kitchen b complex vitamins tablet Take 1 tablet by mouth daily with lunch.    . Calcium Carb-Cholecalciferol (CALCIUM 600+D) 600-800 MG-UNIT TABS Take 1 tablet by mouth daily with lunch.    . celecoxib (CELEBREX) 200 MG capsule Take 200 mg by mouth daily.     . citalopram (CELEXA) 20 MG tablet TAKE 1 TABLET DAILY 90 tablet 2  . Coenzyme Q10 (COQ10) 50 MG CAPS Take 50 mg by mouth daily with lunch.    . DUREZOL 0.05 % EMUL Place 1 drop into the right eye 4 (four) times daily.    . Ferrous Sulfate Dried (SLOW RELEASE IRON) 45 MG TBCR Take 45 mg by mouth daily with lunch.    . levothyroxine (SYNTHROID, LEVOTHROID) 125 MCG tablet Take 1 tablet (125 mcg total) by mouth daily before breakfast. 90 tablet 1  . loperamide (IMODIUM) 2 MG capsule Take 2 mg by mouth every morning.    . Multiple Vitamin (MULTIVITAMIN WITH MINERALS) TABS tablet Take 1 tablet by mouth daily with lunch.    . Multiple Vitamins-Minerals (EYE VITAMINS & MINERALS) TABS Take 1 tablet by mouth daily.     Marland Kitchen omeprazole (PRILOSEC) 20 MG capsule Take 1 capsule (20 mg total) by mouth daily. 90 capsule 3  . pravastatin (PRAVACHOL) 40 MG tablet Take 0.5 tablets (20 mg total) by mouth daily. 45 tablet 3  . Probiotic Product (PROBIOTIC PO) Take 1 capsule by mouth daily with breakfast.    . TURMERIC PO Take 1 capsule by mouth daily at 12 noon.     No  current facility-administered medications for this visit.     OBJECTIVE: BP (!) 147/76   Pulse 72   Temp 97.8 F (36.6 C) (Tympanic)   Resp 20   Wt 211 lb 11.2 oz (96 kg)   BMI 38.72 kg/m    Body mass index is 38.72 kg/m.    ECOG FS:0 - Asymptomatic  General: Well-developed, well-nourished, no acute distress. Eyes: Pink conjunctiva, anicteric sclera. Lungs: Clear to auscultation bilaterally. Heart: Regular rate and rhythm. No rubs, murmurs, or gallops. Abdomen: Soft, nontender, nondistended. No organomegaly noted, normoactive bowel sounds. Musculoskeletal: No edema, cyanosis, or clubbing. Neuro: Alert, answering all questions appropriately. Cranial nerves grossly intact. Skin: No rashes or petechiae noted. Psych: Normal affect.   LAB RESULTS:  December 27, 2016: WBC 6.6, hemoglobin 10.4, platelet 144. Total iron 20, iron saturation 7%, ferritin 55. April 27, 2017: WBC 6.8, hemoglobin 11.3, platelets 151. Total iron 26, iron saturation 8%, ferritin 24.   STUDIES: No results found.  ASSESSMENT & PLAN:    1. Iron deficiency anemia: Patient reportedly had a normal colonoscopy in March 2016. Patient's hemoglobin is now within normal limits, although her iron stores remain decreased. Proceed with one infusion of 510 mg IV Feraheme today. She does not require second infusion. Return to clinic in 6 months with repeat laboratory work, further evaluation, and consideration of additional IV iron. Patient gets her labwork completed at [facility].  2. History of melanoma: Melanoma of left thigh status post resection in 1993, exact stage is not known, metastatic to inguinal lymph node. No evidence of recurrent disease. 3. Back Pain: Patient had surgery in September 2017.  4. Hypertension: Patient's blood pressure is mildly elevated today, monitor.   Patient expressed understanding and was in agreement with this plan. She also understands that She can call clinic at any time with any questions,  concerns, or complaints.    Lloyd Huger, MD   05/04/2017 12:02 PM

## 2017-05-04 ENCOUNTER — Ambulatory Visit: Payer: Medicare Other

## 2017-05-04 ENCOUNTER — Ambulatory Visit: Payer: Medicare Other | Admitting: Oncology

## 2017-05-04 ENCOUNTER — Inpatient Hospital Stay: Payer: Medicare Other | Attending: Oncology | Admitting: Oncology

## 2017-05-04 ENCOUNTER — Inpatient Hospital Stay: Payer: Medicare Other

## 2017-05-04 ENCOUNTER — Encounter: Payer: Self-pay | Admitting: Oncology

## 2017-05-04 VITALS — BP 147/76 | HR 72 | Temp 97.8°F | Resp 20 | Wt 211.7 lb

## 2017-05-04 DIAGNOSIS — K219 Gastro-esophageal reflux disease without esophagitis: Secondary | ICD-10-CM | POA: Insufficient documentation

## 2017-05-04 DIAGNOSIS — Z85820 Personal history of malignant melanoma of skin: Secondary | ICD-10-CM | POA: Insufficient documentation

## 2017-05-04 DIAGNOSIS — E785 Hyperlipidemia, unspecified: Secondary | ICD-10-CM | POA: Insufficient documentation

## 2017-05-04 DIAGNOSIS — D509 Iron deficiency anemia, unspecified: Secondary | ICD-10-CM | POA: Insufficient documentation

## 2017-05-04 DIAGNOSIS — Z79899 Other long term (current) drug therapy: Secondary | ICD-10-CM | POA: Diagnosis not present

## 2017-05-04 DIAGNOSIS — I10 Essential (primary) hypertension: Secondary | ICD-10-CM | POA: Insufficient documentation

## 2017-05-04 DIAGNOSIS — J45909 Unspecified asthma, uncomplicated: Secondary | ICD-10-CM | POA: Insufficient documentation

## 2017-05-04 DIAGNOSIS — D508 Other iron deficiency anemias: Secondary | ICD-10-CM

## 2017-05-04 DIAGNOSIS — E039 Hypothyroidism, unspecified: Secondary | ICD-10-CM | POA: Insufficient documentation

## 2017-05-04 DIAGNOSIS — Z87891 Personal history of nicotine dependence: Secondary | ICD-10-CM | POA: Insufficient documentation

## 2017-05-04 MED ORDER — SODIUM CHLORIDE 0.9 % IV SOLN
510.0000 mg | Freq: Once | INTRAVENOUS | Status: AC
  Administered 2017-05-04: 510 mg via INTRAVENOUS
  Filled 2017-05-04: qty 17

## 2017-05-04 NOTE — Progress Notes (Signed)
Patient denies any concerns today.  

## 2017-05-06 ENCOUNTER — Encounter: Payer: Self-pay | Admitting: Oncology

## 2017-05-18 DIAGNOSIS — M25551 Pain in right hip: Secondary | ICD-10-CM | POA: Diagnosis not present

## 2017-05-23 ENCOUNTER — Telehealth: Payer: Self-pay | Admitting: *Deleted

## 2017-05-23 NOTE — Telephone Encounter (Signed)
Iron deficiency anemia.  I do not know the ICD 10 code.

## 2017-05-23 NOTE — Telephone Encounter (Signed)
[facility] called needing a diagnosis Please return call with info [phone]

## 2017-05-23 NOTE — Telephone Encounter (Signed)
Called and spoke with Janet Rice and she updated information

## 2017-05-26 ENCOUNTER — Other Ambulatory Visit: Payer: Self-pay | Admitting: Oncology

## 2017-05-26 DIAGNOSIS — Z1231 Encounter for screening mammogram for malignant neoplasm of breast: Secondary | ICD-10-CM

## 2017-06-27 ENCOUNTER — Ambulatory Visit
Admission: RE | Admit: 2017-06-27 | Discharge: 2017-06-27 | Disposition: A | Discharge status: Home / Self Care | Payer: Medicare Other | Source: Ambulatory Visit | Attending: Oncology | Admitting: Oncology

## 2017-06-27 DIAGNOSIS — Z1231 Encounter for screening mammogram for malignant neoplasm of breast: Secondary | ICD-10-CM | POA: Insufficient documentation

## 2017-07-10 ENCOUNTER — Other Ambulatory Visit: Payer: Self-pay | Admitting: Family Medicine

## 2017-07-13 ENCOUNTER — Encounter: Payer: Self-pay | Admitting: *Deleted

## 2017-07-13 NOTE — Telephone Encounter (Signed)
Sent mychart message for pt to est care with new PCP

## 2017-07-14 NOTE — Telephone Encounter (Signed)
Sent to pharmacy. Needs to establish care. 

## 2017-07-14 NOTE — Telephone Encounter (Signed)
Last OV 08/31/16 last filled 01/21/17 90 1rf

## 2017-07-22 ENCOUNTER — Ambulatory Visit (INDEPENDENT_AMBULATORY_CARE_PROVIDER_SITE_OTHER): Payer: Medicare Other | Admitting: Internal Medicine

## 2017-07-22 ENCOUNTER — Encounter: Payer: Self-pay | Admitting: Internal Medicine

## 2017-07-22 VITALS — BP 122/60 | HR 82 | Temp 98.3°F | Ht 62.0 in | Wt 212.2 lb

## 2017-07-22 DIAGNOSIS — R011 Cardiac murmur, unspecified: Secondary | ICD-10-CM | POA: Diagnosis not present

## 2017-07-22 DIAGNOSIS — M5136 Other intervertebral disc degeneration, lumbar region: Secondary | ICD-10-CM

## 2017-07-22 DIAGNOSIS — M810 Age-related osteoporosis without current pathological fracture: Secondary | ICD-10-CM

## 2017-07-22 DIAGNOSIS — Z23 Encounter for immunization: Secondary | ICD-10-CM

## 2017-07-22 DIAGNOSIS — E039 Hypothyroidism, unspecified: Secondary | ICD-10-CM | POA: Diagnosis not present

## 2017-07-22 DIAGNOSIS — M1610 Unilateral primary osteoarthritis, unspecified hip: Secondary | ICD-10-CM

## 2017-07-22 DIAGNOSIS — Z85820 Personal history of malignant melanoma of skin: Secondary | ICD-10-CM

## 2017-07-22 DIAGNOSIS — M791 Myalgia, unspecified site: Secondary | ICD-10-CM

## 2017-07-22 DIAGNOSIS — M48061 Spinal stenosis, lumbar region without neurogenic claudication: Secondary | ICD-10-CM

## 2017-07-22 DIAGNOSIS — M4316 Spondylolisthesis, lumbar region: Secondary | ICD-10-CM | POA: Diagnosis not present

## 2017-07-22 DIAGNOSIS — M2550 Pain in unspecified joint: Secondary | ICD-10-CM

## 2017-07-22 NOTE — Patient Instructions (Addendum)
Please follow up in 4-6 weeks  We will schedule echo and bone density  Take care  Please make sure you are taking calcium 600 mg 2x per day and vit D at least 1000 IU daily    Muscle Pain, Adult Muscle pain (myalgia) may be mild or severe. In most cases, the pain lasts only a short time and it goes away without treatment. It is normal to feel some muscle pain after starting a workout program. Muscles that have not been used often will be sore at first. Muscle pain may also be caused by many other things, including:  Overuse or muscle strain, especially if you are not in shape. This is the most common cause of muscle pain.  Injury.  Bruises.  Viruses, such as the flu.  Infectious diseases.  A chronic condition that causes muscle tenderness, fatigue, and headache (fibromyalgia).  A condition, such as lupus, in which the body's disease-fighting system attacks other organs in the body (autoimmune or rheumatologic diseases).  Certain drugs, including ACE inhibitors and statins.  To diagnose the cause of your muscle pain, your health care provider will do a physical exam and ask questions about the pain and when it began. If you have not had muscle pain for very long, your health care provider may want to wait before doing much testing. If your muscle pain has lasted a long time, your health care provider may want to run tests right away. In some cases, this may include tests to rule out certain conditions or illnesses. Treatment for muscle pain depends on the cause. Home care is often enough to relieve muscle pain. Your health care provider may also prescribe anti-inflammatory medicine. Follow these instructions at home: Activity  If overuse is causing your muscle pain: ? Slow down your activities until the pain goes away. ? Do regular, gentle exercises if you are not usually active. ? Warm up before exercising. Stretch before and after exercising. This can help lower the risk of muscle  pain.  Do not continue working out if the pain is very bad. Bad pain could mean that you have injured a muscle. Managing pain and discomfort   If directed, apply ice to the sore muscle: ? Put ice in a plastic bag. ? Place a towel between your skin and the bag. ? Leave the ice on for 20 minutes, 2-3 times a day.  You may also alternate between applying ice and applying heat as told by your health care provider. To apply heat, use the heat source that your health care provider recommends, such as a moist heat pack or a heating pad. ? Place a towel between your skin and the heat source. ? Leave the heat on for 20-30 minutes. ? Remove the heat if your skin turns bright red. This is especially important if you are unable to feel pain, heat, or cold. You may have a greater risk of getting burned. Medicines  Take over-the-counter and prescription medicines only as told by your health care provider.  Do not drive or use heavy machinery while taking prescription pain medicine. Contact a health care provider if:  Your muscle pain gets worse and medicines do not help.  You have muscle pain that lasts longer than 3 days.  You have a rash or fever along with muscle pain.  You have muscle pain after a tick bite.  You have muscle pain while working out, even though you are in good physical condition.  You have redness, soreness,  or swelling along with muscle pain.  You have muscle pain after starting a new medicine or changing the dose of a medicine. Get help right away if:  You have trouble breathing.  You have trouble swallowing.  You have muscle pain along with a stiff neck, fever, and vomiting.  You have severe muscle weakness or cannot move part of your body. This information is not intended to replace advice given to you by your health care provider. Make sure you discuss any questions you have with your health care provider. Document Released: 05/20/2006 Document Revised:  01/16/2016 Document Reviewed: 11/18/2015 Elsevier Interactive Patient Education  2018 [employer].

## 2017-07-25 ENCOUNTER — Encounter: Payer: Self-pay | Admitting: Internal Medicine

## 2017-07-25 DIAGNOSIS — M2550 Pain in unspecified joint: Secondary | ICD-10-CM | POA: Insufficient documentation

## 2017-07-25 DIAGNOSIS — M791 Myalgia, unspecified site: Secondary | ICD-10-CM | POA: Insufficient documentation

## 2017-07-25 NOTE — Progress Notes (Addendum)
Chief Complaint  Patient presents with  . Establish Care   Establish care  1. C/o arthralgia and chronic back pain (Dr. Arnoldo Morale) with DDD/scoliosis and ankle pain and right hip replacement (Dr. Lisette Grinder). She also is having muscle pain esp in arm muscles.  2. H/o MM left thigh with chronic lymphadema she wears compression stocking and s/p LN removal left groin with Dr. Tollie Pizza. 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   Review of Systems  Constitutional: Negative for weight loss.  HENT: Negative for hearing loss.   Eyes:       No vision issues  Respiratory: Negative for shortness of breath.   Cardiovascular: Negative for chest pain.  Gastrointestinal: Negative for abdominal pain.  Musculoskeletal: Positive for back pain, joint pain and myalgias.  Skin: Negative for rash.  Neurological: Negative for headaches.  Psychiatric/Behavioral: Negative for memory loss.   Past Medical History:  Diagnosis Date  . Adenomatous colon polyp   . Arthritis   . Childhood asthma    AS A CHILD  . Chronic heartburn    On omeprazole  . Complication of anesthesia    HARD TO WAKE UP  . External hemorrhoids   . Frequent loose stools   . GERD (gastroesophageal reflux disease)   . Heme positive stool   . Hyperlipidemia   . Hypothyroidism   . Iron deficiency anemia   . Metastatic melanoma ([place])    Followed by Dr Oliva Bustard, previous chemo, no recurrence, 2008?   Past Surgical History:  Procedure Laterality Date  . ABDOMINAL HYSTERECTOMY  1984  . BACK SURGERY  03/2016   LUMBAR  . CARPAL TUNNEL RELEASE Right 09/07/2016   Procedure: CARPAL TUNNEL RELEASE;  Surgeon: Thornton Park, MD;  Location: [facility] ORS;  Service: Orthopedics;  Laterality: Right;  . CHOLECYSTECTOMY  1990  . COLONOSCOPY  08/2006   Four sessile polyps found and removed in sigmoid colon at splenic flexure and ascending colon. 7 mm in size. Another removed from transverse colon 4 mm. Path Report - showed tubular adenoma and  hyperplastic polyp. Advised to repeat in 3.5 years (02/2010)  . COLONOSCOPY  3.2.2011   8 mm polyp in sigmoid colon, removed, 4 mm polyp in descending colon, removed. Internal hemorrhoids  . ESOPHAGOGASTRODUODENOSCOPY  3.2.2011   Large hiatia hernia, esophagus normal, mulitple small sessile polyps w/no stigmata of recent bleeding found. Mildy erythermatous mucosa w/no bleeding found in gatric antrum. Normal duodenum. 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 PATH - no active inflammation, antral mucosa w/mild foveolar hyperplasia  . FRACTURE SURGERY    . Lymph Node Removal  12/2005   Left inguinal lymph node dissection  . MELANOMA EXCISION Left 1993   Left thigh  . ORIF ANKLE FRACTURE Right    Dr. Mack Guise  . TOTAL HIP ARTHROPLASTY Right 11/18/2016   Procedure: TOTAL HIP ARTHROPLASTY;  Surgeon: Thornton Park, MD;  Location: [facility] ORS;  Service: Orthopedics;  Laterality: Right;   Family History  Problem Relation Age of Onset  . Aneurysm Mother   . Heart disease Mother   . Colon polyps Father   . Diabetes Father   . Leukemia Maternal Grandfather   . Skin cancer Paternal Grandfather   . Diabetes Other   . Colon polyps Other   . Colon cancer Neg Hx   . Breast cancer Neg Hx    Social History   Socioeconomic History  . Marital status: Married    Spouse name: Not on file  .  Number of children: 2  . Years of education: Not on file  . Highest education level: Not on file  Social Needs  . Financial resource strain: Not on file  . Food insecurity - worry: Not on file  . Food insecurity - inability: Not on file  . Transportation needs - medical: Not on file  . Transportation needs - non-medical: Not on file  Occupational History  . Occupation: HR Consultant    Employer: [employer]  Tobacco Use  . Smoking status: Former Smoker    Packs/day: 0.50    Years: 8.00    Pack years: 4.00    Types: Cigarettes    Last attempt to quit: 07/12/1978    Years since quitting: 39.0  .  Smokeless tobacco: Never Used  Substance and Sexual Activity  . Alcohol use: No    Alcohol/week: 0.0 oz  . Drug use: No  . Sexual activity: Not Currently  Other Topics Concern  . Not on file  Social History Narrative  . Not on file   Current Meds  Medication Sig  . acetaminophen (TYLENOL 8 HOUR ARTHRITIS PAIN) 650 MG CR tablet Take 650 mg by mouth every 8 (eight) hours as needed for pain (taking 2 tablets once dail).  Marland Kitchen b complex vitamins tablet Take 1 tablet by mouth daily with lunch.  . Calcium Carb-Cholecalciferol (CALCIUM 600+D) 600-800 MG-UNIT TABS Take 1 tablet by mouth daily with lunch.  . celecoxib (CELEBREX) 200 MG capsule Take 200 mg by mouth daily.   . citalopram (CELEXA) 20 MG tablet TAKE 1 TABLET DAILY  . Coenzyme Q10 (COQ10) 50 MG CAPS Take 50 mg by mouth daily with lunch.  . Ferrous Sulfate Dried (SLOW RELEASE IRON) 45 MG TBCR Take 45 mg by mouth daily with lunch.  . levothyroxine (SYNTHROID, LEVOTHROID) 125 MCG tablet TAKE 1 TABLET DAILY BEFORE BREAKFAST  . loperamide (IMODIUM) 2 MG capsule Take 2 mg by mouth every morning.  . Multiple Vitamin (MULTIVITAMIN WITH MINERALS) TABS tablet Take 1 tablet by mouth daily with lunch.  . Multiple Vitamins-Minerals (EYE VITAMINS & MINERALS) TABS Take 1 tablet by mouth daily.   Marland Kitchen omeprazole (PRILOSEC) 20 MG capsule Take 1 capsule (20 mg total) by mouth daily.  . pravastatin (PRAVACHOL) 40 MG tablet Take 0.5 tablets (20 mg total) by mouth daily.  . Probiotic Product (PROBIOTIC PO) Take 1 capsule by mouth daily with breakfast.  . TURMERIC PO Take 1 capsule by mouth daily at 12 noon.   Allergies  Allergen Reactions  . Sulfa Antibiotics Rash   No results found for this or any previous visit (from the past 2160 hour(s)). Objective  Body mass index is 38.82 kg/m. Wt Readings from Last 3 Encounters:  07/22/17 212 lb 4 oz (96.3 kg)  05/04/17 211 lb 11.2 oz (96 kg)  12/29/16 211 lb 9.6 oz (96 kg)   Temp Readings from Last 3  Encounters:  07/22/17 98.3 F (36.8 C) (Oral)  05/04/17 97.8 F (36.6 C) (Tympanic)  01/05/17 (!) 96.6 F (35.9 C) (Tympanic)   BP Readings from Last 3 Encounters:  07/22/17 122/60  05/04/17 (!) 147/76  01/05/17 124/77   Pulse Readings from Last 3 Encounters:  07/22/17 82  05/04/17 72  01/05/17 89   O2 sat room air 98%   Physical Exam  Constitutional: She is oriented to person, place, and time and well-developed, well-nourished, and in no distress. Vital signs are normal.  HENT:  Head: Normocephalic and atraumatic.  Mouth/Throat: Oropharynx is clear  and moist and mucous membranes are normal.  Eyes: Conjunctivae are normal. Pupils are equal, round, and reactive to light.  Cardiovascular: Normal rate and regular rhythm.  Murmur heard. Trace edema left leg   Pulmonary/Chest: Effort normal and breath sounds normal.  Abdominal: Soft. Bowel sounds are normal. There is no tenderness.  Neurological: She is alert and oriented to person, place, and time. Gait normal. Gait normal.  Skin: Skin is warm, dry and intact.  Psychiatric: Mood, memory, affect and judgment normal.  Nursing note and vitals reviewed.   Assessment   1. Arthragia/myalgia w/u RA/autoimmune d/o  2. H/o MM left thigh s/p left groin LN removal and h/o SCC 3. Left hip pain  4. Chronic back pain 2/2 DDD/scoliosis 5. Cardiac murmur  6. HM 7. H/o MNG, hypothyroidism  Plan  1.  Labs at [facility] CMET, CBC, TSH, T4 (h/o MNG), UA, anemia, Vit d (h/o def), RA w/u, CK, check hep B status  Prn tylenol  2. F/u Dr. Kellie Moor as scheduled  3.f/u Dr. Lisette Grinder  4.f/u with Dr. Arnoldo Morale  5.  Echo to w/u  6.  Had flu shot today  Other vxs UTD except shingrix disc in today.   Ordered dexa last 05/18/12 osteopeorosis never took any meds  Hep C negative in the past per pt husband has hep C  Colonoscopy had 10/02/14 5 polyps repeat in 5 years.   mammo neg 06/27/17   S/p hyssterctomy age 39 y.o for cycts no h/o abnormal  pap per pt    Will need to check lipid when fasting   Former smoker 40 years ago quit smoked <1 ppd total 6-7 years.  7.  Cont meds   NS saw 04/18/18 [place] Dr. Arnoldo Morale low back pain pending lumbar myelo CT to check fusion  [facility] left hip bursitis, left knee OA given 6 mg of Celestone with 9 ml lidocaine 1% for pain left hip and 40 mg kenalog with 5 cc 1% lidocaine left knee  Dr. Mack Guise   Lumbar myelo CT f/u 05/12/18 Dr. Babs Bertin NS adult deg scoliosis lumbar spinal stenosis with neurogenic claudication, lumbago and radiculopathy will forgo surgery  Seen 08/01/2018 L3/4 fusion 07/10/18 Dr. Arnoldo Morale doing well     Provider: Dr. Olivia Mackie Mc[facility]oc[facility]

## 2017-07-27 ENCOUNTER — Other Ambulatory Visit: Payer: Self-pay | Admitting: Internal Medicine

## 2017-07-27 DIAGNOSIS — D649 Anemia, unspecified: Secondary | ICD-10-CM | POA: Diagnosis not present

## 2017-07-27 DIAGNOSIS — Z85820 Personal history of malignant melanoma of skin: Secondary | ICD-10-CM | POA: Diagnosis not present

## 2017-07-27 DIAGNOSIS — M791 Myalgia, unspecified site: Secondary | ICD-10-CM | POA: Diagnosis not present

## 2017-07-27 DIAGNOSIS — M2550 Pain in unspecified joint: Secondary | ICD-10-CM | POA: Diagnosis not present

## 2017-07-27 DIAGNOSIS — M810 Age-related osteoporosis without current pathological fracture: Secondary | ICD-10-CM | POA: Diagnosis not present

## 2017-07-27 DIAGNOSIS — Z1159 Encounter for screening for other viral diseases: Secondary | ICD-10-CM | POA: Diagnosis not present

## 2017-07-27 DIAGNOSIS — Z85828 Personal history of other malignant neoplasm of skin: Secondary | ICD-10-CM | POA: Diagnosis not present

## 2017-07-27 DIAGNOSIS — D225 Melanocytic nevi of trunk: Secondary | ICD-10-CM | POA: Diagnosis not present

## 2017-07-27 DIAGNOSIS — M910 Juvenile osteochondrosis of pelvis: Secondary | ICD-10-CM | POA: Diagnosis not present

## 2017-07-27 DIAGNOSIS — L820 Inflamed seborrheic keratosis: Secondary | ICD-10-CM | POA: Diagnosis not present

## 2017-07-27 DIAGNOSIS — E785 Hyperlipidemia, unspecified: Secondary | ICD-10-CM | POA: Diagnosis not present

## 2017-07-29 LAB — COMPREHENSIVE METABOLIC PANEL
ALT: 32 IU/L (ref 0–32)
AST: 24 IU/L (ref 0–40)
Albumin/Globulin Ratio: 1.6 (ref 1.2–2.2)
Albumin: 4.1 g/dL (ref 3.5–4.8)
Alkaline Phosphatase: 144 IU/L — ABNORMAL HIGH (ref 39–117)
BUN/Creatinine Ratio: 16 (ref 12–28)
BUN: 15 mg/dL (ref 8–27)
Bilirubin Total: <0.2 mg/dL (ref 0.0–1.2)
CO2: 26 mmol/L (ref 20–29)
Calcium: 9.1 mg/dL (ref 8.7–10.3)
Chloride: 100 mmol/L (ref 96–106)
Creatinine, Ser: 0.96 mg/dL (ref 0.57–1.00)
GFR calc Af Amer: 69 mL/min/{1.73_m2} (ref >59)
GFR calc non Af Amer: 60 mL/min/{1.73_m2} (ref >59)
Globulin, Total: 2.5 g/dL (ref 1.5–4.5)
Glucose: 102 mg/dL — ABNORMAL HIGH (ref 65–99)
Potassium: 4.8 mmol/L (ref 3.5–5.2)
Sodium: 141 mmol/L (ref 134–144)
Total Protein: 6.6 g/dL (ref 6.0–8.5)

## 2017-07-29 LAB — MICROSCOPIC EXAMINATION
Bacteria, UA: NONE SEEN
Casts: NONE SEEN /lpf

## 2017-07-29 LAB — URINALYSIS, ROUTINE W REFLEX MICROSCOPIC
Bilirubin, UA: NEGATIVE
Glucose, UA: NEGATIVE
Ketones, UA: NEGATIVE
Nitrite, UA: NEGATIVE
Protein, UA: NEGATIVE
RBC, UA: NEGATIVE
Specific Gravity, UA: 1.014 (ref 1.005–1.030)
Urobilinogen, Ur: 0.2 mg/dL (ref 0.2–1.0)
pH, UA: 6 (ref 5.0–7.5)

## 2017-07-29 LAB — CBC WITH DIFFERENTIAL/PLATELET
Basophils Absolute: 0 10*3/uL (ref 0.0–0.2)
Basos: 0 %
EOS (ABSOLUTE): 0.2 10*3/uL (ref 0.0–0.4)
Eos: 2 %
Hematocrit: 31.4 % — ABNORMAL LOW (ref 34.0–46.6)
Hemoglobin: 10.1 g/dL — ABNORMAL LOW (ref 11.1–15.9)
Immature Grans (Abs): 0 10*3/uL (ref 0.0–0.1)
Immature Granulocytes: 0 %
Lymphocytes Absolute: 1.2 10*3/uL (ref 0.7–3.1)
Lymphs: 17 %
MCH: 26.2 pg — ABNORMAL LOW (ref 26.6–33.0)
MCHC: 32.2 g/dL (ref 31.5–35.7)
MCV: 82 fL (ref 79–97)
Monocytes Absolute: 0.3 10*3/uL (ref 0.1–0.9)
Monocytes: 5 %
Neutrophils Absolute: 5.1 10*3/uL (ref 1.4–7.0)
Neutrophils: 76 %
Platelets: 149 10*3/uL — ABNORMAL LOW (ref 150–379)
RBC: 3.85 x10E6/uL (ref 3.77–5.28)
RDW: 15.8 % — ABNORMAL HIGH (ref 12.3–15.4)
WBC: 6.8 10*3/uL (ref 3.4–10.8)

## 2017-07-29 LAB — VITAMIN D 25 HYDROXY (VIT D DEFICIENCY, FRACTURES): Vit D, 25-Hydroxy: 33.2 ng/mL (ref 30.0–100.0)

## 2017-07-29 LAB — C-REACTIVE PROTEIN: CRP: 16.8 mg/L — ABNORMAL HIGH (ref 0.0–4.9)

## 2017-07-29 LAB — HEPATITIS B SURFACE ANTIBODY, QUANTITATIVE: Hepatitis B Surf Ab Quant: 5.6 m[IU]/mL — ABNORMAL LOW (ref >9.9)

## 2017-07-29 LAB — CK: Total CK: 74 U/L (ref 24–173)

## 2017-07-29 LAB — LIPID PANEL W/O CHOL/HDL RATIO
Cholesterol, Total: 192 mg/dL (ref 100–199)
HDL: 53 mg/dL (ref >39)
LDL Calculated: 100 mg/dL — ABNORMAL HIGH (ref 0–99)
Triglycerides: 194 mg/dL — ABNORMAL HIGH (ref 0–149)
VLDL Cholesterol Cal: 39 mg/dL (ref 5–40)

## 2017-07-29 LAB — T4, FREE: Free T4: 1.4 ng/dL (ref 0.82–1.77)

## 2017-07-29 LAB — IRON AND TIBC
Iron Saturation: 7 % — CL (ref 15–55)
Iron: 21 ug/dL — ABNORMAL LOW (ref 27–139)
Total Iron Binding Capacity: 312 ug/dL (ref 250–450)
UIBC: 291 ug/dL (ref 118–369)

## 2017-07-29 LAB — CYCLIC CITRUL PEPTIDE ANTIBODY, IGG/IGA: Cyclic Citrullin Peptide Ab: 8 units (ref 0–19)

## 2017-07-29 LAB — RHEUMATOID FACTOR: Rhuematoid fact SerPl-aCnc: 11 IU/mL (ref 0.0–13.9)

## 2017-07-29 LAB — HEPATITIS B SURFACE ANTIGEN: Hepatitis B Surface Ag: NEGATIVE

## 2017-07-29 LAB — SEDIMENTATION RATE: Sed Rate: 33 mm/hr (ref 0–40)

## 2017-07-29 LAB — FERRITIN: Ferritin: 22 ng/mL (ref 15–150)

## 2017-07-29 LAB — TSH: TSH: 0.868 u[IU]/mL (ref 0.450–4.500)

## 2017-07-29 LAB — ANA: Anti Nuclear Antibody(ANA): POSITIVE — AB

## 2017-08-01 DIAGNOSIS — H35351 Cystoid macular degeneration, right eye: Secondary | ICD-10-CM | POA: Diagnosis not present

## 2017-08-03 DIAGNOSIS — M25552 Pain in left hip: Secondary | ICD-10-CM | POA: Diagnosis not present

## 2017-08-04 ENCOUNTER — Other Ambulatory Visit: Payer: Self-pay | Admitting: Internal Medicine

## 2017-08-04 DIAGNOSIS — R768 Other specified abnormal immunological findings in serum: Secondary | ICD-10-CM

## 2017-08-11 ENCOUNTER — Ambulatory Visit
Admission: RE | Admit: 2017-08-11 | Discharge: 2017-08-11 | Disposition: A | Discharge status: Home / Self Care | Payer: Medicare Other | Source: Ambulatory Visit | Attending: Internal Medicine | Admitting: Internal Medicine

## 2017-08-11 DIAGNOSIS — R011 Cardiac murmur, unspecified: Secondary | ICD-10-CM | POA: Diagnosis not present

## 2017-08-11 DIAGNOSIS — Z85820 Personal history of malignant melanoma of skin: Secondary | ICD-10-CM | POA: Diagnosis not present

## 2017-08-11 DIAGNOSIS — I351 Nonrheumatic aortic (valve) insufficiency: Secondary | ICD-10-CM | POA: Diagnosis not present

## 2017-08-11 DIAGNOSIS — K219 Gastro-esophageal reflux disease without esophagitis: Secondary | ICD-10-CM | POA: Diagnosis not present

## 2017-08-11 DIAGNOSIS — E039 Hypothyroidism, unspecified: Secondary | ICD-10-CM | POA: Insufficient documentation

## 2017-08-11 DIAGNOSIS — E785 Hyperlipidemia, unspecified: Secondary | ICD-10-CM | POA: Diagnosis not present

## 2017-08-11 NOTE — Progress Notes (Signed)
*  PRELIMINARY RESULTS* Echocardiogram 2D Echocardiogram has been performed.  Sherrie Sport 08/11/2017, 11:42 AM

## 2017-08-13 ENCOUNTER — Encounter: Payer: Self-pay | Admitting: Internal Medicine

## 2017-08-18 ENCOUNTER — Other Ambulatory Visit: Payer: Self-pay | Admitting: Internal Medicine

## 2017-08-18 DIAGNOSIS — R768 Other specified abnormal immunological findings in serum: Secondary | ICD-10-CM | POA: Diagnosis not present

## 2017-08-22 ENCOUNTER — Encounter: Payer: Self-pay | Admitting: *Deleted

## 2017-08-23 DIAGNOSIS — Z6839 Body mass index (BMI) 39.0-39.9, adult: Secondary | ICD-10-CM | POA: Diagnosis not present

## 2017-08-23 DIAGNOSIS — M4150 Other secondary scoliosis, site unspecified: Secondary | ICD-10-CM | POA: Diagnosis not present

## 2017-08-23 DIAGNOSIS — R030 Elevated blood-pressure reading, without diagnosis of hypertension: Secondary | ICD-10-CM | POA: Diagnosis not present

## 2017-08-23 DIAGNOSIS — M545 Low back pain: Secondary | ICD-10-CM | POA: Diagnosis not present

## 2017-08-23 DIAGNOSIS — M4316 Spondylolisthesis, lumbar region: Secondary | ICD-10-CM | POA: Diagnosis not present

## 2017-08-23 LAB — ANTINUCLEAR ANTIBODIES, IFA: ANA Titer 1: NEGATIVE

## 2017-09-01 ENCOUNTER — Encounter: Payer: Self-pay | Admitting: Internal Medicine

## 2017-09-05 ENCOUNTER — Encounter: Payer: Self-pay | Admitting: Internal Medicine

## 2017-09-05 ENCOUNTER — Other Ambulatory Visit: Payer: Self-pay | Admitting: Family Medicine

## 2017-09-12 ENCOUNTER — Other Ambulatory Visit: Payer: Self-pay | Admitting: Internal Medicine

## 2017-09-12 ENCOUNTER — Encounter: Payer: Self-pay | Admitting: Internal Medicine

## 2017-09-12 DIAGNOSIS — M2550 Pain in unspecified joint: Secondary | ICD-10-CM

## 2017-09-12 DIAGNOSIS — R768 Other specified abnormal immunological findings in serum: Secondary | ICD-10-CM

## 2017-09-12 DIAGNOSIS — R7982 Elevated C-reactive protein (CRP): Secondary | ICD-10-CM

## 2017-09-21 NOTE — Telephone Encounter (Signed)
Referral was sent on 09/13/17 to see the Rheumatologist.

## 2017-09-23 ENCOUNTER — Telehealth: Payer: Self-pay | Admitting: *Deleted

## 2017-09-23 ENCOUNTER — Telehealth: Payer: Self-pay

## 2017-09-23 DIAGNOSIS — D509 Iron deficiency anemia, unspecified: Secondary | ICD-10-CM | POA: Diagnosis not present

## 2017-09-23 NOTE — Telephone Encounter (Signed)
Patient was contacted and informed her orders for labs was faxed to [facility]. The patient agreed and was please to be informed.

## 2017-09-23 NOTE — Telephone Encounter (Signed)
Patient called and states she needs her [facility] order form faxed to [phone] this morning so that she can get her labs done before her appointment Tuesday. Please fax order

## 2017-09-23 NOTE — Telephone Encounter (Signed)
Patient was contacted to inform her / the order for Lab work was faxed to [facility]. The patient express understanding.

## 2017-09-23 NOTE — Telephone Encounter (Signed)
COURTNEY, Please COMPLETE FORM AND HAVRE DR Grayland Ormond SIGN THEN FAX, [place]

## 2017-09-23 NOTE — Telephone Encounter (Signed)
Ok. She will need cbc, ferritin, iron&IBC.  Thanks!

## 2017-09-25 NOTE — Progress Notes (Signed)
[place]  [phone]  [phone]) [phone]     Janet Rice DOB: [birth year]  MR#: [id number]  PZW#:[id number]  Patient Care Team: McLean-Scocuzza, Nino Glow, MD as PCP - General (Internal Medicine)  CHIEF COMPLAINT: Iron deficiency anemia.  INTERVAL HISTORY:  Patient returns to clinic for further evaluation and consideration of additional IV Feraheme. She admits to increased weakness and fatigue, but otherwise feels well.  She has no neurologic complaints. She denies any recent fevers or illnesses. She has a good appetite and denies weight loss. She denies any chest pain or shortness of breath. She denies any nausea, vomiting, constipation, or diarrhea. She has no melena or hematochezia. She has no urinary complaints. Patient offers no further specific complaints today.   REVIEW OF SYSTEMS:   Review of Systems  Constitutional: Positive for malaise/fatigue. Negative for fever and weight loss.  Eyes: Negative.   Respiratory: Negative.  Negative for cough and shortness of breath.   Cardiovascular: Negative for chest pain and leg swelling.  Gastrointestinal: Negative.  Negative for abdominal pain, blood in stool and melena.  Genitourinary: Negative.  Negative for hematuria.  Musculoskeletal: Positive for back pain and joint pain.  Skin: Negative.  Negative for rash.  Neurological: Positive for weakness. Negative for sensory change and focal weakness.  Psychiatric/Behavioral: Negative.  The patient is not nervous/anxious.     As per HPI. Otherwise, a complete review of systems is negative.  ONCOLOGY HISTORY:  No history exists.    PAST MEDICAL HISTORY: Past Medical History:  Diagnosis Date  . Adenomatous colon polyp   . Arthritis   . Childhood asthma    AS A CHILD  . Chronic heartburn    On omeprazole  . Complication of anesthesia    HARD TO WAKE UP  . External hemorrhoids   . Frequent loose stools   . GERD (gastroesophageal reflux disease)     . Heme positive stool   . Hyperlipidemia   . Hypothyroidism   . Iron deficiency anemia   . Melanoma ([place])    left thigh s/p LN removal left groin  . Metastatic melanoma ([place])    Followed by Dr Oliva Bustard, previous chemo, no recurrence, 2008?  . Multinodular goiter   . SCC (squamous cell carcinoma)    skin    PAST SURGICAL HISTORY: Past Surgical History:  Procedure Laterality Date  . ABDOMINAL HYSTERECTOMY  1984  . BACK SURGERY  03/2016   LUMBAR  . CARPAL TUNNEL RELEASE Right 09/07/2016   Procedure: CARPAL TUNNEL RELEASE;  Surgeon: Thornton Park, MD;  Location: [facility] ORS;  Service: Orthopedics;  Laterality: Right;  . CHOLECYSTECTOMY  1990  . COLONOSCOPY  08/2006   Four sessile polyps found and removed in sigmoid colon at splenic flexure and ascending colon. 7 mm in size. Another removed from transverse colon 4 mm. Path Report - showed tubular adenoma and hyperplastic polyp. Advised to repeat in 3.5 years (02/2010)  . COLONOSCOPY  3.2.2011   8 mm polyp in sigmoid colon, removed, 4 mm polyp in descending colon, removed. Internal hemorrhoids  . ESOPHAGOGASTRODUODENOSCOPY  3.2.2011   Large hiatia hernia, esophagus normal, mulitple small sessile polyps w/no stigmata of recent bleeding found. Mildy erythermatous mucosa w/no bleeding found in gatric antrum. Normal duodenum. 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 PATH - no active inflammation, antral mucosa w/mild foveolar hyperplasia  . FRACTURE SURGERY    . Lymph Node Removal  12/2005   Left inguinal lymph node dissection  . MELANOMA  EXCISION Left 1993   Left thigh  . ORIF ANKLE FRACTURE Right    Dr. Mack Guise  . TOTAL HIP ARTHROPLASTY Right 11/18/2016   Procedure: TOTAL HIP ARTHROPLASTY;  Surgeon: Thornton Park, MD;  Location: [facility] ORS;  Service: Orthopedics;  Laterality: Right;    FAMILY HISTORY Family History  Problem Relation Age of Onset  . Aneurysm Mother   . Heart disease Mother   . Colon polyps Father   .  Diabetes Father   . Leukemia Maternal Grandfather   . Skin cancer Paternal Grandfather   . Diabetes Other   . Colon polyps Other   . Colon cancer Neg Hx   . Breast cancer Neg Hx     GYNECOLOGIC HISTORY:  No LMP recorded. Patient has had a hysterectomy.     ADVANCED DIRECTIVES:    HEALTH MAINTENANCE: Social History   Tobacco Use  . Smoking status: Former Smoker    Packs/day: 0.50    Years: 8.00    Pack years: 4.00    Types: Cigarettes    Last attempt to quit: 07/12/1978    Years since quitting: 39.2  . Smokeless tobacco: Never Used  Substance Use Topics  . Alcohol use: No    Alcohol/week: 0.0 oz  . Drug use: No    Allergies  Allergen Reactions  . Sulfa Antibiotics Rash    Current Outpatient Medications  Medication Sig Dispense Refill  . acetaminophen (TYLENOL 8 HOUR ARTHRITIS PAIN) 650 MG CR tablet Take 650 mg by mouth every 8 (eight) hours as needed for pain (taking 2 tablets once dail).    Marland Kitchen b complex vitamins tablet Take 1 tablet by mouth daily with lunch.    . Calcium Carb-Cholecalciferol (CALCIUM 600+D) 600-800 MG-UNIT TABS Take 1 tablet by mouth daily with lunch.    . celecoxib (CELEBREX) 200 MG capsule Take 200 mg by mouth daily.     . citalopram (CELEXA) 20 MG tablet TAKE 1 TABLET DAILY 90 tablet 2  . Coenzyme Q10 (COQ10) 50 MG CAPS Take 50 mg by mouth daily with lunch.    . Ferrous Sulfate Dried (SLOW RELEASE IRON) 45 MG TBCR Take 45 mg by mouth daily with lunch.    . levothyroxine (SYNTHROID, LEVOTHROID) 125 MCG tablet TAKE 1 TABLET DAILY BEFORE BREAKFAST 90 tablet 1  . loperamide (IMODIUM) 2 MG capsule Take 2 mg by mouth every morning.    . Multiple Vitamin (MULTIVITAMIN WITH MINERALS) TABS tablet Take 1 tablet by mouth daily with lunch.    . Multiple Vitamins-Minerals (EYE VITAMINS & MINERALS) TABS Take 1 tablet by mouth daily.     Marland Kitchen omeprazole (PRILOSEC) 20 MG capsule Take 1 capsule (20 mg total) by mouth daily. 90 capsule 3  . pravastatin (PRAVACHOL) 40  MG tablet Take 0.5 tablets (20 mg total) by mouth daily. 45 tablet 3  . Probiotic Product (PROBIOTIC PO) Take 1 capsule by mouth daily with breakfast.    . TURMERIC PO Take 1 capsule by mouth daily at 12 noon.     No current facility-administered medications for this visit.     OBJECTIVE: BP 122/72 (BP Location: Right Arm, Patient Position: Sitting)   Pulse 83   Temp 97.7 F (36.5 C) (Tympanic)   Resp 18   Wt 214 lb 6 oz (97.2 kg)   BMI 39.21 kg/m    Body mass index is 39.21 kg/m.    ECOG FS:0 - Asymptomatic  General: Well-developed, well-nourished, no acute distress. Eyes: Pink conjunctiva, anicteric sclera. Lungs:  Clear to auscultation bilaterally. Heart: Regular rate and rhythm. No rubs, murmurs, or gallops. Abdomen: Soft, nontender, nondistended. No organomegaly noted, normoactive bowel sounds. Musculoskeletal: No edema, cyanosis, or clubbing. Neuro: Alert, answering all questions appropriately. Cranial nerves grossly intact. Skin: No rashes or petechiae noted. Psych: Normal affect.   LAB RESULTS:  December 27, 2016: WBC 6.6, hemoglobin 10.4, platelet 144. Total iron 20, iron saturation 7%, ferritin 55. April 27, 2017: WBC 6.8, hemoglobin 11.3, platelets 151. Total iron 26, iron saturation 8%, ferritin 24. September 23, 2017: WBC 7.5, hemoglobin 10.9, platelet 165.  Total iron 18, iron saturation 5%, ferritin 21.   STUDIES: No results found.  ASSESSMENT & PLAN:    1. Iron deficiency anemia: Patient reportedly had a normal colonoscopy in March 2016. Patient's hemoglobin and iron stores have trended down and she is mildly symptomatic. Proceed with one infusion of 510 mg IV Feraheme today.  Patient will return to clinic in 1 week for second infusion and then in 3 months with repeat laboratory work and further evaluation. Patient gets her labwork completed at [facility].  2. History of melanoma: Melanoma of left thigh status post resection in 1993, exact stage is not known, metastatic  to inguinal lymph node. No evidence of recurrent disease. 3. Back Pain: Patient had surgery in September 2017.  4. Hypertension: Patient's blood pressure is within normal limits today, monitor.   Patient expressed understanding and was in agreement with this plan. She also understands that She can call clinic at any time with any questions, concerns, or complaints.    Lloyd Huger, MD   09/27/2017 1:36 PM

## 2017-09-27 ENCOUNTER — Inpatient Hospital Stay: Payer: Medicare Other | Attending: Oncology | Admitting: Oncology

## 2017-09-27 ENCOUNTER — Inpatient Hospital Stay: Payer: Medicare Other

## 2017-09-27 ENCOUNTER — Encounter: Payer: Self-pay | Admitting: Oncology

## 2017-09-27 ENCOUNTER — Encounter: Payer: Self-pay | Admitting: Internal Medicine

## 2017-09-27 VITALS — BP 122/72 | HR 83 | Temp 97.7°F | Resp 18 | Wt 214.4 lb

## 2017-09-27 VITALS — BP 106/60 | HR 61 | Temp 97.6°F | Resp 18

## 2017-09-27 DIAGNOSIS — R531 Weakness: Secondary | ICD-10-CM | POA: Insufficient documentation

## 2017-09-27 DIAGNOSIS — E039 Hypothyroidism, unspecified: Secondary | ICD-10-CM | POA: Diagnosis not present

## 2017-09-27 DIAGNOSIS — D508 Other iron deficiency anemias: Secondary | ICD-10-CM

## 2017-09-27 DIAGNOSIS — D509 Iron deficiency anemia, unspecified: Secondary | ICD-10-CM | POA: Insufficient documentation

## 2017-09-27 DIAGNOSIS — Z85820 Personal history of malignant melanoma of skin: Secondary | ICD-10-CM | POA: Diagnosis not present

## 2017-09-27 MED ORDER — SODIUM CHLORIDE 0.9 % IV SOLN
510.0000 mg | Freq: Once | INTRAVENOUS | Status: AC
  Administered 2017-09-27: 510 mg via INTRAVENOUS
  Filled 2017-09-27: qty 17

## 2017-09-27 NOTE — Progress Notes (Signed)
Pt in for follow up, reports continued fatigue.

## 2017-10-04 ENCOUNTER — Inpatient Hospital Stay: Payer: Medicare Other

## 2017-10-04 VITALS — BP 99/64 | HR 72 | Temp 99.2°F | Resp 18

## 2017-10-04 DIAGNOSIS — Z85820 Personal history of malignant melanoma of skin: Secondary | ICD-10-CM | POA: Diagnosis not present

## 2017-10-04 DIAGNOSIS — D508 Other iron deficiency anemias: Secondary | ICD-10-CM

## 2017-10-04 DIAGNOSIS — R531 Weakness: Secondary | ICD-10-CM | POA: Diagnosis not present

## 2017-10-04 DIAGNOSIS — E039 Hypothyroidism, unspecified: Secondary | ICD-10-CM | POA: Diagnosis not present

## 2017-10-04 DIAGNOSIS — D509 Iron deficiency anemia, unspecified: Secondary | ICD-10-CM | POA: Diagnosis not present

## 2017-10-04 MED ORDER — SODIUM CHLORIDE 0.9 % IV SOLN
510.0000 mg | Freq: Once | INTRAVENOUS | Status: AC
  Administered 2017-10-04: 510 mg via INTRAVENOUS
  Filled 2017-10-04: qty 17

## 2017-10-04 MED ORDER — SODIUM CHLORIDE 0.9 % IV SOLN
Freq: Once | INTRAVENOUS | Status: AC
  Administered 2017-10-04: 15:00:00 via INTRAVENOUS
  Filled 2017-10-04: qty 1000

## 2017-10-25 DIAGNOSIS — M160 Bilateral primary osteoarthritis of hip: Secondary | ICD-10-CM | POA: Diagnosis not present

## 2017-10-25 DIAGNOSIS — M791 Myalgia, unspecified site: Secondary | ICD-10-CM | POA: Diagnosis not present

## 2017-10-25 DIAGNOSIS — M48062 Spinal stenosis, lumbar region with neurogenic claudication: Secondary | ICD-10-CM | POA: Diagnosis not present

## 2017-10-25 DIAGNOSIS — C4372 Malignant melanoma of left lower limb, including hip: Secondary | ICD-10-CM | POA: Diagnosis not present

## 2017-10-25 DIAGNOSIS — M79652 Pain in left thigh: Secondary | ICD-10-CM | POA: Diagnosis not present

## 2017-10-25 DIAGNOSIS — M5136 Other intervertebral disc degeneration, lumbar region: Secondary | ICD-10-CM | POA: Diagnosis not present

## 2017-10-25 DIAGNOSIS — M2550 Pain in unspecified joint: Secondary | ICD-10-CM | POA: Diagnosis not present

## 2017-10-28 DIAGNOSIS — M2550 Pain in unspecified joint: Secondary | ICD-10-CM | POA: Diagnosis not present

## 2017-10-28 DIAGNOSIS — M160 Bilateral primary osteoarthritis of hip: Secondary | ICD-10-CM | POA: Diagnosis not present

## 2017-10-28 DIAGNOSIS — M791 Myalgia, unspecified site: Secondary | ICD-10-CM | POA: Diagnosis not present

## 2017-10-31 ENCOUNTER — Other Ambulatory Visit: Payer: Self-pay | Admitting: Oncology

## 2017-11-03 DIAGNOSIS — M19042 Primary osteoarthritis, left hand: Secondary | ICD-10-CM | POA: Insufficient documentation

## 2017-11-03 DIAGNOSIS — M1712 Unilateral primary osteoarthritis, left knee: Secondary | ICD-10-CM | POA: Insufficient documentation

## 2017-11-03 DIAGNOSIS — C4372 Malignant melanoma of left lower limb, including hip: Secondary | ICD-10-CM | POA: Diagnosis not present

## 2017-11-03 DIAGNOSIS — M19041 Primary osteoarthritis, right hand: Secondary | ICD-10-CM | POA: Diagnosis not present

## 2017-11-03 DIAGNOSIS — M1612 Unilateral primary osteoarthritis, left hip: Secondary | ICD-10-CM | POA: Diagnosis not present

## 2017-11-07 ENCOUNTER — Ambulatory Visit: Payer: Medicare Other

## 2017-11-07 ENCOUNTER — Ambulatory Visit: Payer: Medicare Other | Admitting: Oncology

## 2017-11-15 DIAGNOSIS — H2513 Age-related nuclear cataract, bilateral: Secondary | ICD-10-CM | POA: Diagnosis not present

## 2017-11-17 DIAGNOSIS — M25552 Pain in left hip: Secondary | ICD-10-CM | POA: Diagnosis not present

## 2017-12-07 DIAGNOSIS — H2513 Age-related nuclear cataract, bilateral: Secondary | ICD-10-CM | POA: Diagnosis not present

## 2017-12-20 DIAGNOSIS — Z961 Presence of intraocular lens: Secondary | ICD-10-CM | POA: Diagnosis not present

## 2017-12-20 DIAGNOSIS — K219 Gastro-esophageal reflux disease without esophagitis: Secondary | ICD-10-CM | POA: Diagnosis not present

## 2017-12-20 DIAGNOSIS — H269 Unspecified cataract: Secondary | ICD-10-CM | POA: Diagnosis not present

## 2017-12-20 DIAGNOSIS — Z87891 Personal history of nicotine dependence: Secondary | ICD-10-CM | POA: Diagnosis not present

## 2017-12-20 DIAGNOSIS — F418 Other specified anxiety disorders: Secondary | ICD-10-CM | POA: Diagnosis not present

## 2017-12-20 DIAGNOSIS — F419 Anxiety disorder, unspecified: Secondary | ICD-10-CM | POA: Diagnosis not present

## 2017-12-20 DIAGNOSIS — H2511 Age-related nuclear cataract, right eye: Secondary | ICD-10-CM | POA: Diagnosis not present

## 2017-12-20 DIAGNOSIS — Z85828 Personal history of other malignant neoplasm of skin: Secondary | ICD-10-CM | POA: Diagnosis not present

## 2017-12-20 DIAGNOSIS — M1990 Unspecified osteoarthritis, unspecified site: Secondary | ICD-10-CM | POA: Diagnosis not present

## 2017-12-20 DIAGNOSIS — E039 Hypothyroidism, unspecified: Secondary | ICD-10-CM | POA: Diagnosis not present

## 2017-12-20 DIAGNOSIS — E669 Obesity, unspecified: Secondary | ICD-10-CM | POA: Diagnosis not present

## 2017-12-20 DIAGNOSIS — E785 Hyperlipidemia, unspecified: Secondary | ICD-10-CM | POA: Diagnosis not present

## 2017-12-20 DIAGNOSIS — Z79899 Other long term (current) drug therapy: Secondary | ICD-10-CM | POA: Diagnosis not present

## 2017-12-20 DIAGNOSIS — Z6838 Body mass index (BMI) 38.0-38.9, adult: Secondary | ICD-10-CM | POA: Diagnosis not present

## 2017-12-20 DIAGNOSIS — F329 Major depressive disorder, single episode, unspecified: Secondary | ICD-10-CM | POA: Diagnosis not present

## 2017-12-22 ENCOUNTER — Encounter: Payer: Self-pay | Admitting: Oncology

## 2017-12-22 DIAGNOSIS — D509 Iron deficiency anemia, unspecified: Secondary | ICD-10-CM | POA: Diagnosis not present

## 2017-12-25 NOTE — Progress Notes (Signed)
[place]  Telephone:([phone]) [phone]  Fax:(336) [phone]     Janet Rice DOB: 08-25-1945  MR#: [id number]  UMP#:[id number]  Patient Care Team: McLean-Scocuzza, Nino Glow, MD as PCP - General (Internal Medicine)  CHIEF COMPLAINT: Iron deficiency anemia.  INTERVAL HISTORY: Patient returns to clinic today for further evaluation, discussion of her laboratory work, and consideration of additional IV Feraheme.  She continues to have chronic weakness and fatigue, but otherwise feels well.  She has no neurologic complaints. She denies any recent fevers or illnesses. She has a good appetite and denies weight loss. She denies any chest pain or shortness of breath. She denies any nausea, vomiting, constipation, or diarrhea. She has no melena or hematochezia. She has no urinary complaints.  Patient offers no further specific complaints today.  REVIEW OF SYSTEMS:   Review of Systems  Constitutional: Positive for malaise/fatigue. Negative for fever and weight loss.  Eyes: Negative.   Respiratory: Negative.  Negative for cough and shortness of breath.   Cardiovascular: Negative.  Negative for chest pain and leg swelling.  Gastrointestinal: Negative.  Negative for abdominal pain, blood in stool and melena.  Genitourinary: Negative.  Negative for hematuria.  Musculoskeletal: Positive for back pain and joint pain.  Skin: Negative.  Negative for rash.  Neurological: Positive for weakness. Negative for sensory change and focal weakness.  Psychiatric/Behavioral: Negative.  The patient is not nervous/anxious.     As per HPI. Otherwise, a complete review of systems is negative.  ONCOLOGY HISTORY:  No history exists.    PAST MEDICAL HISTORY: Past Medical History:  Diagnosis Date  . Adenomatous colon polyp   . Arthritis   . Childhood asthma    AS A CHILD  . Chronic heartburn    On omeprazole  . Complication of anesthesia    HARD TO WAKE UP  . External hemorrhoids   . Frequent  loose stools   . GERD (gastroesophageal reflux disease)   . Heme positive stool   . Hyperlipidemia   . Hypothyroidism   . Iron deficiency anemia   . Melanoma ([place])    left thigh s/p LN removal left groin  . Metastatic melanoma ([place])    Followed by Dr Oliva Bustard, previous chemo, no recurrence, 2008?  . Multinodular goiter   . SCC (squamous cell carcinoma)    skin    PAST SURGICAL HISTORY: Past Surgical History:  Procedure Laterality Date  . ABDOMINAL HYSTERECTOMY  1984  . BACK SURGERY  03/2016   LUMBAR  . CARPAL TUNNEL RELEASE Right 09/07/2016   Procedure: CARPAL TUNNEL RELEASE;  Surgeon: Thornton Park, MD;  Location: [facility] ORS;  Service: Orthopedics;  Laterality: Right;  . CHOLECYSTECTOMY  1990  . COLONOSCOPY  08/2006   Four sessile polyps found and removed in sigmoid colon at splenic flexure and ascending colon. 7 mm in size. Another removed from transverse colon 4 mm. Path Report - showed tubular adenoma and hyperplastic polyp. Advised to repeat in 3.5 years (02/2010)  . COLONOSCOPY  3.2.2011   8 mm polyp in sigmoid colon, removed, 4 mm polyp in descending colon, removed. Internal hemorrhoids  . ESOPHAGOGASTRODUODENOSCOPY  3.2.2011   Large hiatia hernia, esophagus normal, mulitple small sessile polyps w/no stigmata of recent bleeding found. Mildy erythermatous mucosa w/no bleeding found in gatric antrum. Normal duodenum. 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 PATH - no active inflammation, antral mucosa w/mild foveolar hyperplasia  . FRACTURE SURGERY    . Lymph Node Removal  12/2005  Left inguinal lymph node dissection  . MELANOMA EXCISION Left 1993   Left thigh  . ORIF ANKLE FRACTURE Right    Dr. Mack Guise  . TOTAL HIP ARTHROPLASTY Right 11/18/2016   Procedure: TOTAL HIP ARTHROPLASTY;  Surgeon: Thornton Park, MD;  Location: [facility] ORS;  Service: Orthopedics;  Laterality: Right;    FAMILY HISTORY Family History  Problem Relation Age of Onset  . Aneurysm Mother     . Heart disease Mother   . Colon polyps Father   . Diabetes Father   . Dementia Father   . Stroke Father        TIA  . Leukemia Maternal Grandfather   . Skin cancer Paternal Grandfather   . Diabetes Other   . Colon polyps Other   . Colon cancer Neg Hx   . Breast cancer Neg Hx     GYNECOLOGIC HISTORY:  No LMP recorded. Patient has had a hysterectomy.     ADVANCED DIRECTIVES:    HEALTH MAINTENANCE: Social History   Tobacco Use  . Smoking status: Former Smoker    Packs/day: 0.50    Years: 8.00    Pack years: 4.00    Types: Cigarettes    Last attempt to quit: 07/12/1978    Years since quitting: 39.5  . Smokeless tobacco: Never Used  Substance Use Topics  . Alcohol use: No    Alcohol/week: 0.0 oz  . Drug use: No    Allergies  Allergen Reactions  . Sulfa Antibiotics Rash    Current Outpatient Medications  Medication Sig Dispense Refill  . acetaminophen (TYLENOL 8 HOUR ARTHRITIS PAIN) 650 MG CR tablet Take 650 mg by mouth every 8 (eight) hours as needed for pain (taking 2 tablets once dail).    Marland Kitchen b complex vitamins tablet Take 1 tablet by mouth daily with lunch.    . Calcium Carb-Cholecalciferol (CALCIUM 600+D) 600-800 MG-UNIT TABS Take 1 tablet by mouth daily with lunch.    . celecoxib (CELEBREX) 200 MG capsule Take 200 mg by mouth daily.     . citalopram (CELEXA) 20 MG tablet TAKE 1 TABLET DAILY 90 tablet 2  . Coenzyme Q10 (COQ10) 50 MG CAPS Take 50 mg by mouth daily with lunch.    . Ferrous Sulfate Dried (SLOW RELEASE IRON) 45 MG TBCR Take 45 mg by mouth daily with lunch.    . levothyroxine (SYNTHROID, LEVOTHROID) 125 MCG tablet TAKE 1 TABLET DAILY BEFORE BREAKFAST 90 tablet 1  . Multiple Vitamin (MULTIVITAMIN WITH MINERALS) TABS tablet Take 1 tablet by mouth daily with lunch.    . Multiple Vitamins-Minerals (EYE VITAMINS & MINERALS) TABS Take 1 tablet by mouth daily.     Marland Kitchen omeprazole (PRILOSEC) 20 MG capsule Take 1 capsule (20 mg total) by mouth daily. 90 capsule 3   . pravastatin (PRAVACHOL) 40 MG tablet Take 0.5 tablets (20 mg total) by mouth daily. 45 tablet 3  . TURMERIC PO Take 1 capsule by mouth daily at 12 noon.    . loperamide (IMODIUM) 2 MG capsule Take 2 mg by mouth every morning.    . Probiotic Product (PROBIOTIC PO) Take 1 capsule by mouth daily with breakfast.     No current facility-administered medications for this visit.     OBJECTIVE: BP (!) 160/77 (BP Location: Left Arm, Patient Position: Sitting)   Pulse 89   Temp (!) 95.8 F (35.4 C) (Tympanic)   Resp 18   Wt 215 lb 11.2 oz (97.8 kg)   BMI 39.45 kg/m  Body mass index is 39.45 kg/m.    ECOG FS:0 - Asymptomatic  General: Well-developed, well-nourished, no acute distress. Eyes: Pink conjunctiva, anicteric sclera. Lungs: Clear to auscultation bilaterally. Heart: Regular rate and rhythm. No rubs, murmurs, or gallops. Abdomen: Soft, nontender, nondistended. No organomegaly noted, normoactive bowel sounds. Musculoskeletal: No edema, cyanosis, or clubbing. Neuro: Alert, answering all questions appropriately. Cranial nerves grossly intact. Skin: No rashes or petechiae noted. Psych: Normal affect.  LAB RESULTS:  December 27, 2016: WBC 6.6, hemoglobin 10.4, platelet 144. Total iron 20, iron saturation 7%, ferritin 55. April 27, 2017: WBC 6.8, hemoglobin 11.3, platelets 151. Total iron 26, iron saturation 8%, ferritin 24. September 23, 2017: WBC 7.5, hemoglobin 10.9, platelet 165.  Total iron 18, iron saturation 5%, ferritin 21. December 22, 2017: WBC 6.9, hemoglobin 10.3, platelets 159.  Total iron 22, iron saturation 7%, ferritin 22.   STUDIES: No results found.  ASSESSMENT & PLAN:    1. Iron deficiency anemia: Patient reportedly had a normal colonoscopy in March 2016.  Despite receiving 2 infusions of IV Feraheme in March 2019, patient's hemoglobin and iron stores remain relatively unchanged.  Will proceed with 510 mg IV Feraheme today.  Return to clinic in 1 week for second  infusion.  Patient will then return to clinic in 3 months with repeat laboratory work and further evaluation.   Patient gets her labwork completed at [facility].  2. History of melanoma: Melanoma of left thigh status post resection in 1993, exact stage is not known, metastatic to inguinal lymph node. No evidence of recurrent disease. 3. Back Pain: Patient had surgery in September 2017.  4. Hypertension: Patient's blood pressure is moderately elevated today.  Continue monitoring and treatment per primary care.  I spent a total of 30 minutes face-to-face with the patient of which greater than 50% of the visit was spent in counseling and coordination of care as summarized above.  Patient expressed understanding and was in agreement with this plan. She also understands that She can call clinic at any time with any questions, concerns, or complaints.    Lloyd Huger, MD   01/01/2018 7:49 AM

## 2017-12-27 ENCOUNTER — Inpatient Hospital Stay: Payer: Medicare Other | Attending: Oncology

## 2017-12-27 ENCOUNTER — Inpatient Hospital Stay: Payer: Medicare Other | Attending: Oncology | Admitting: Oncology

## 2017-12-27 ENCOUNTER — Other Ambulatory Visit: Payer: Self-pay

## 2017-12-27 VITALS — BP 160/77 | HR 89 | Temp 95.8°F | Resp 18 | Wt 215.7 lb

## 2017-12-27 DIAGNOSIS — D509 Iron deficiency anemia, unspecified: Secondary | ICD-10-CM | POA: Insufficient documentation

## 2017-12-27 DIAGNOSIS — D508 Other iron deficiency anemias: Secondary | ICD-10-CM

## 2017-12-27 MED ORDER — FERUMOXYTOL INJECTION 510 MG/17 ML
510.0000 mg | Freq: Once | INTRAVENOUS | Status: AC
  Administered 2017-12-27: 510 mg via INTRAVENOUS
  Filled 2017-12-27: qty 17

## 2017-12-27 NOTE — Progress Notes (Signed)
Here for follow up. Stated " overall still feeling tired " legs and back pain on going.

## 2017-12-30 ENCOUNTER — Ambulatory Visit (INDEPENDENT_AMBULATORY_CARE_PROVIDER_SITE_OTHER): Payer: Medicare Other

## 2017-12-30 VITALS — BP 120/70 | HR 65 | Temp 98.5°F | Resp 15 | Ht 62.0 in | Wt 216.8 lb

## 2017-12-30 DIAGNOSIS — Z1211 Encounter for screening for malignant neoplasm of colon: Secondary | ICD-10-CM

## 2017-12-30 DIAGNOSIS — Z0000 Encounter for general adult medical examination without abnormal findings: Secondary | ICD-10-CM

## 2017-12-30 NOTE — Patient Instructions (Addendum)
Janet Rice , Thank you for taking time to come for your Medicare Wellness Visit. I appreciate your ongoing commitment to your health goals. Please review the following plan we discussed and let me know if I can assist you in the future.   Follow up as needed.    Bring a copy of your [place] and/or Living Will to be scanned into chart.   Colonoscopy ordered; follow as directed.    Nice to meet you. Have a great day!  These are the goals we discussed: Goals    . Increase physical activity     Exercise as tolerated    . Low carb diet       This is a list of the screening recommended for you and due dates:  Health Maintenance  Topic Date Due  . Colon Cancer Screening  10/01/2017  . Flu Shot  02/09/2018  . Mammogram  06/28/2019  . Tetanus Vaccine  03/24/2022  . DEXA scan (bone density measurement)  Completed  .  Hepatitis C: One time screening is recommended by Center for Disease Control  (CDC) for  adults born from [birth year] through 1965.   Completed  . Pneumonia vaccines  Completed    Colonoscopy, Adult A colonoscopy is an exam to look at the entire large intestine. During the exam, a lubricated, bendable tube is inserted into the anus and then passed into the rectum, colon, and other parts of the large intestine. A colonoscopy is often done as a part of normal colorectal screening or in response to certain symptoms, such as anemia, persistent diarrhea, abdominal pain, and blood in the stool. The exam can help screen for and diagnose medical problems, including:  Tumors.  Polyps.  Inflammation.  Areas of bleeding.  Tell a health care provider about:  Any allergies you have.  All medicines you are taking, including vitamins, herbs, eye drops, creams, and over-the-counter medicines.  Any problems you or family members have had with anesthetic medicines.  Any blood disorders you have.  Any surgeries you have had.  Any medical conditions you  have.  Any problems you have had passing stool. What are the risks? Generally, this is a safe procedure. However, problems may occur, including:  Bleeding.  A tear in the intestine.  A reaction to medicines given during the exam.  Infection (rare).  What happens before the procedure? Eating and drinking restrictions Follow instructions from your health care provider about eating and drinking, which may include:  A few days before the procedure - follow a low-fiber diet. Avoid nuts, seeds, dried fruit, raw fruits, and vegetables.  1-3 days before the procedure - follow a clear liquid diet. Drink only clear liquids, such as clear broth or bouillon, black coffee or tea, clear juice, clear soft drinks or sports drinks, gelatin dessert, and popsicles. Avoid any liquids that contain red or purple dye.  On the day of the procedure - do not eat or drink anything during the 2 hours before the procedure, or within the time period that your health care provider recommends.  Bowel prep If you were prescribed an oral bowel prep to clean out your colon:  Take it as told by your health care provider. Starting the day before your procedure, you will need to drink a large amount of medicated liquid. The liquid will cause you to have multiple loose stools until your stool is almost clear or light green.  If your skin or anus gets irritated from  diarrhea, you may use these to relieve the irritation: ? Medicated wipes, such as adult wet wipes with aloe and vitamin E. ? A skin soothing-product like petroleum jelly.  If you vomit while drinking the bowel prep, take a break for up to 60 minutes and then begin the bowel prep again. If vomiting continues and you cannot take the bowel prep without vomiting, call your health care provider.  General instructions  Ask your health care provider about changing or stopping your regular medicines. This is especially important if you are taking diabetes medicines  or blood thinners.  Plan to have someone take you home from the hospital or clinic. What happens during the procedure?  An IV tube may be inserted into one of your veins.  You will be given medicine to help you relax (sedative).  To reduce your risk of infection: ? Your health care team will wash or sanitize their hands. ? Your anal area will be washed with soap.  You will be asked to lie on your side with your knees bent.  Your health care provider will lubricate a long, thin, flexible tube. The tube will have a camera and a light on the end.  The tube will be inserted into your anus.  The tube will be gently eased through your rectum and colon.  Air will be delivered into your colon to keep it open. You may feel some pressure or cramping.  The camera will be used to take images during the procedure.  A small tissue sample may be removed from your body to be examined under a microscope (biopsy). If any potential problems are found, the tissue will be sent to a lab for testing.  If small polyps are found, your health care provider may remove them and have them checked for cancer cells.  The tube that was inserted into your anus will be slowly removed. The procedure may vary among health care providers and hospitals. What happens after the procedure?  Your blood pressure, heart rate, breathing rate, and blood oxygen level will be monitored until the medicines you were given have worn off.  Do not drive for 24 hours after the exam.  You may have a small amount of blood in your stool.  You may pass gas and have mild abdominal cramping or bloating due to the air that was used to inflate your colon during the exam.  It is up to you to get the results of your procedure. Ask your health care provider, or the department performing the procedure, when your results will be ready. This information is not intended to replace advice given to you by your health care provider. Make sure  you discuss any questions you have with your health care provider. Document Released: 06/25/2000 Document Revised: 04/28/2016 Document Reviewed: 09/09/2015 Elsevier Interactive Patient Education  2018 [employer].

## 2017-12-30 NOTE — Progress Notes (Signed)
Subjective:   Janet Rice is a 72 y.o. female who presents for Medicare Annual (Subsequent) preventive examination.  Review of Systems:  No ROS.  Medicare Wellness Visit. Additional risk factors are reflected in the social history.  Cardiac Risk Factors include: advanced age (>55men, >87 women);obesity (BMI >30kg/m2)     Objective:     Vitals: BP 120/70 (BP Location: Left Arm, Patient Position: Sitting, Cuff Size: Normal)   Pulse 65   Temp 98.5 F (36.9 C) (Oral)   Resp 15   Ht 5\' 2"  (1.575 m)   Wt 216 lb 12.8 oz (98.3 kg)   SpO2 97%   BMI 39.65 kg/m   Body mass index is 39.65 kg/m.  Advanced Directives 12/27/2017 09/27/2017 11/19/2016 11/03/2016 09/14/2016 08/31/2016 06/14/2016  Does Patient Have a Medical Advance Directive? Yes Yes Yes Yes Yes Yes Yes  Type of Paramedic of [place];Living will Living will;Healthcare Power of [place];Living will - [place];Living will [place];Living will  Does patient want to make changes to medical advance directive? - - No - Patient declined No - Patient declined - - No - Patient declined  Copy of [place] in Chart? No - copy requested - No - copy requested No - copy requested - No - copy requested No - copy requested    Tobacco Social History   Tobacco Use  Smoking Status Former Smoker  . Packs/day: 0.50  . Years: 8.00  . Pack years: 4.00  . Types: Cigarettes  . Last attempt to quit: 07/12/1978  . Years since quitting: 39.4  Smokeless Tobacco Never Used     Counseling given: Not Answered   Clinical Intake:  Pre-visit preparation completed: Yes  Pain : Faces Faces Pain Scale: Hurts a little bit Pain Type: Chronic pain Pain Location: Back Pain Frequency: Intermittent  Faces Pain Scale: Hurts a little bit  Nutritional Status: BMI > 30  Obese Diabetes: No  How often do you need  to have someone help you when you read instructions, pamphlets, or other written materials from your doctor or pharmacy?: 1 - Never  Interpreter Needed?: No     Past Medical History:  Diagnosis Date  . Adenomatous colon polyp   . Arthritis   . Childhood asthma    AS A CHILD  . Chronic heartburn    On omeprazole  . Complication of anesthesia    HARD TO WAKE UP  . External hemorrhoids   . Frequent loose stools   . GERD (gastroesophageal reflux disease)   . Heme positive stool   . Hyperlipidemia   . Hypothyroidism   . Iron deficiency anemia   . Melanoma ([place])    left thigh s/p LN removal left groin  . Metastatic melanoma ([place])    Followed by Dr Oliva Bustard, previous chemo, no recurrence, 2008?  . Multinodular goiter   . SCC (squamous cell carcinoma)    skin   Past Surgical History:  Procedure Laterality Date  . ABDOMINAL HYSTERECTOMY  1984  . BACK SURGERY  03/2016   LUMBAR  . CARPAL TUNNEL RELEASE Right 09/07/2016   Procedure: CARPAL TUNNEL RELEASE;  Surgeon: Thornton Park, MD;  Location: [facility] ORS;  Service: Orthopedics;  Laterality: Right;  . CHOLECYSTECTOMY  1990  . COLONOSCOPY  08/2006   Four sessile polyps found and removed in sigmoid colon at splenic flexure and ascending colon. 7 mm in size. Another removed  from transverse colon 4 mm. Path Report - showed tubular adenoma and hyperplastic polyp. Advised to repeat in 3.5 years (02/2010)  . COLONOSCOPY  3.2.2011   8 mm polyp in sigmoid colon, removed, 4 mm polyp in descending colon, removed. Internal hemorrhoids  . ESOPHAGOGASTRODUODENOSCOPY  3.2.2011   Large hiatia hernia, esophagus normal, mulitple small sessile polyps w/no stigmata of recent bleeding found. Mildy erythermatous mucosa w/no bleeding found in gatric antrum. Normal duodenum. 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 PATH - no active inflammation, antral mucosa w/mild foveolar hyperplasia  . FRACTURE SURGERY    . Lymph Node Removal  12/2005   Left  inguinal lymph node dissection  . MELANOMA EXCISION Left 1993   Left thigh  . ORIF ANKLE FRACTURE Right    Dr. Mack Guise  . TOTAL HIP ARTHROPLASTY Right 11/18/2016   Procedure: TOTAL HIP ARTHROPLASTY;  Surgeon: Thornton Park, MD;  Location: [facility] ORS;  Service: Orthopedics;  Laterality: Right;   Family History  Problem Relation Age of Onset  . Aneurysm Mother   . Heart disease Mother   . Colon polyps Father   . Diabetes Father   . Dementia Father   . Stroke Father        TIA  . Leukemia Maternal Grandfather   . Skin cancer Paternal Grandfather   . Diabetes Other   . Colon polyps Other   . Colon cancer Neg Hx   . Breast cancer Neg Hx    Social History   Socioeconomic History  . Marital status: Married    Spouse name: Not on file  . Number of children: 2  . Years of education: Not on file  . Highest education level: Not on file  Occupational History  . Occupation: HR Surveyor, mining: [employer]  . Financial resource strain: Not hard at all  . Food insecurity:    Worry: Never true    Inability: Never true  . Transportation needs:    Medical: No    Non-medical: No  Tobacco Use  . Smoking status: Former Smoker    Packs/day: 0.50    Years: 8.00    Pack years: 4.00    Types: Cigarettes    Last attempt to quit: 07/12/1978    Years since quitting: 39.4  . Smokeless tobacco: Never Used  Substance and Sexual Activity  . Alcohol use: No    Alcohol/week: 0.0 oz  . Drug use: No  . Sexual activity: Not Currently  Lifestyle  . Physical activity:    Days per week: 1 day    Minutes per session: 30 min  . Stress: Not at all  Relationships  . Social connections:    Talks on phone: Not on file    Gets together: Not on file    Attends religious service: Not on file    Active member of club or organization: Not on file    Attends meetings of clubs or organizations: Not on file    Relationship status: Not on file  Other Topics Concern  . Not on file    Social History Narrative   Part time [facility]     Outpatient Encounter Medications as of 12/30/2017  Medication Sig  . acetaminophen (TYLENOL 8 HOUR ARTHRITIS PAIN) 650 MG CR tablet Take 650 mg by mouth every 8 (eight) hours as needed for pain (taking 2 tablets once dail).  Marland Kitchen b complex vitamins tablet Take 1 tablet by mouth daily with lunch.  . Calcium  Carb-Cholecalciferol (CALCIUM 600+D) 600-800 MG-UNIT TABS Take 1 tablet by mouth daily with lunch.  . celecoxib (CELEBREX) 200 MG capsule Take 200 mg by mouth daily.   . citalopram (CELEXA) 20 MG tablet TAKE 1 TABLET DAILY  . Coenzyme Q10 (COQ10) 50 MG CAPS Take 50 mg by mouth daily with lunch.  . Ferrous Sulfate Dried (SLOW RELEASE IRON) 45 MG TBCR Take 45 mg by mouth daily with lunch.  . levothyroxine (SYNTHROID, LEVOTHROID) 125 MCG tablet TAKE 1 TABLET DAILY BEFORE BREAKFAST  . loperamide (IMODIUM) 2 MG capsule Take 2 mg by mouth every morning.  . Multiple Vitamin (MULTIVITAMIN WITH MINERALS) TABS tablet Take 1 tablet by mouth daily with lunch.  . Multiple Vitamins-Minerals (EYE VITAMINS & MINERALS) TABS Take 1 tablet by mouth daily.   Marland Kitchen omeprazole (PRILOSEC) 20 MG capsule Take 1 capsule (20 mg total) by mouth daily.  . pravastatin (PRAVACHOL) 40 MG tablet Take 0.5 tablets (20 mg total) by mouth daily.  . Probiotic Product (PROBIOTIC PO) Take 1 capsule by mouth daily with breakfast.  . TURMERIC PO Take 1 capsule by mouth daily at 12 noon.  . [DISCONTINUED] vitamin B-12 (CYANOCOBALAMIN) 250 MCG tablet Take by mouth.   No facility-administered encounter medications on file as of 12/30/2017.     Activities of Daily Living In your present state of health, do you have any difficulty performing the following activities: 12/30/2017  Hearing? N  Vision? N  Difficulty concentrating or making decisions? N  Walking or climbing stairs? N  Dressing or bathing? N  Doing errands, shopping? N  Preparing Food and eating ? N  Using the Toilet? N  In  the past six months, have you accidently leaked urine? N  Do you have problems with loss of bowel control? N  Managing your Medications? N  Managing your Finances? N  Housekeeping or managing your Housekeeping? Y  Comment Housekeeping assists once a week  Some recent data might be hidden    Patient Care Team: McLean-Scocuzza, Nino Glow, MD as PCP - General (Internal Medicine)    Assessment:   This is a routine wellness examination for [place].  The goal of the wellness visit is to assist the patient how to close the gaps in care and create a preventative care plan for the patient.   The roster of all physicians providing medical care to patient is listed in the Snapshot section of the chart.  Taking calcium VIT D as appropriate/Osteoporosis risk reviewed.    Safety issues reviewed; Smoke and carbon monoxide detectors in the home. No firearms in the home. Wears seatbelts when driving or riding with others. No violence in the home.  They do not have excessive sun exposure.  Discussed the need for sun protection: hats, long sleeves and the use of sunscreen if there is significant sun exposure.  Patient is alert, normal appearance, oriented to person/place/and time. Correctly identified the president of the [place] and recalls of 3/3 words.Performs simple calculations and can read correct time from watch face. Displays appropriate judgement.  No new identified risk were noted.  No failures at ADL's or IADL's.  Ambulates with cane as needed.   BMI- discussed the importance of a healthy diet, water intake and the benefits of aerobic exercise. Educational material provided.   24 hour diet recall: Regular diet.  Dental- every 6 months.  Sleep patterns- Sleeps 8 hours at night.    Colonoscopy ordered; follow as directed. Educational material provided.   Patient Concerns: None at  this time. Follow up with PCP as needed.  Exercise Activities and Dietary recommendations Current  Exercise Habits: Home exercise routine, Type of exercise: calisthenics, Time (Minutes): 15, Frequency (Times/Week): 1, Weekly Exercise (Minutes/Week): 15, Intensity: Mild  Goals    . Increase physical activity     Exercise as tolerated    . Low carb diet       Fall Risk Fall Risk  12/30/2017 12/27/2017 10/04/2017 09/27/2017 05/04/2017  Falls in the past year? No No No No No  Number falls in past yr: - - - - -  Injury with Fall? - - - - -   Depression Screen PHQ 2/9 Scores 12/30/2017 12/11/2015 10/15/2015 07/18/2014  PHQ - 2 Score 0 0 0 0     Cognitive Function MMSE - Mini Mental State Exam 12/30/2017 10/15/2015  Orientation to time 5 5  Orientation to Place 5 5  Registration 3 3  Attention/ Calculation 5 5  Recall 3 3  Language- name 2 objects 2 2  Language- repeat 1 1  Language- follow 3 step command 3 3  Language- read & follow direction 1 1  Write a sentence 1 1  Copy design 1 1  Total score 30 30        Immunization History  Administered Date(s) Administered  . Influenza Split 08/17/2011, 05/12/2014  . Influenza, High Dose Seasonal PF 08/31/2016, 07/22/2017  . Influenza-Unspecified 05/16/2013, 04/11/2014  . Pneumococcal Conjugate-13 01/16/2014  . Pneumococcal Polysaccharide-23 03/24/2012  . Tdap 03/24/2012  . Zoster 08/18/2012    Screening Tests Health Maintenance  Topic Date Due  . COLONOSCOPY  10/01/2017  . INFLUENZA VACCINE  02/09/2018  . MAMMOGRAM  06/28/2019  . TETANUS/TDAP  03/24/2022  . DEXA SCAN  Completed  . Hepatitis C Screening  Completed  . PNA vac Low Risk Adult  Completed      Plan:    End of life planning; Advance aging; Advanced directives discussed. Copy of current HCPOA/Living Will requested.    I have personally reviewed and noted the following in the patient's chart:   . Medical and social history . Use of alcohol, tobacco or illicit drugs  . Current medications and supplements . Functional ability and status . Nutritional  status . Physical activity . Advanced directives . List of other physicians . Hospitalizations, surgeries, and ER visits in previous 12 months . Vitals . Screenings to include cognitive, depression, and falls . Referrals and appointments  In addition, I have reviewed and discussed with patient certain preventive protocols, quality metrics, and best practice recommendations. A written personalized care plan for preventive services as well as general preventive health recommendations were provided to patient.     Varney Biles, LPN  01/20/1974

## 2018-01-03 DIAGNOSIS — H2512 Age-related nuclear cataract, left eye: Secondary | ICD-10-CM | POA: Diagnosis not present

## 2018-01-03 DIAGNOSIS — Z6839 Body mass index (BMI) 39.0-39.9, adult: Secondary | ICD-10-CM | POA: Diagnosis not present

## 2018-01-03 DIAGNOSIS — Z961 Presence of intraocular lens: Secondary | ICD-10-CM | POA: Diagnosis not present

## 2018-01-03 DIAGNOSIS — E785 Hyperlipidemia, unspecified: Secondary | ICD-10-CM | POA: Diagnosis not present

## 2018-01-03 DIAGNOSIS — K219 Gastro-esophageal reflux disease without esophagitis: Secondary | ICD-10-CM | POA: Diagnosis not present

## 2018-01-03 DIAGNOSIS — E039 Hypothyroidism, unspecified: Secondary | ICD-10-CM | POA: Diagnosis not present

## 2018-01-03 DIAGNOSIS — E669 Obesity, unspecified: Secondary | ICD-10-CM | POA: Diagnosis not present

## 2018-01-03 DIAGNOSIS — H269 Unspecified cataract: Secondary | ICD-10-CM | POA: Diagnosis not present

## 2018-01-04 ENCOUNTER — Inpatient Hospital Stay: Payer: Medicare Other

## 2018-01-04 VITALS — BP 113/71 | HR 67 | Temp 98.0°F | Resp 18

## 2018-01-04 DIAGNOSIS — D500 Iron deficiency anemia secondary to blood loss (chronic): Secondary | ICD-10-CM

## 2018-01-04 DIAGNOSIS — D508 Other iron deficiency anemias: Secondary | ICD-10-CM

## 2018-01-04 DIAGNOSIS — D509 Iron deficiency anemia, unspecified: Secondary | ICD-10-CM | POA: Diagnosis not present

## 2018-01-04 MED ORDER — SODIUM CHLORIDE 0.9 % IV SOLN
Freq: Once | INTRAVENOUS | Status: AC
  Administered 2018-01-04: 14:00:00 via INTRAVENOUS
  Filled 2018-01-04: qty 1000

## 2018-01-04 MED ORDER — SODIUM CHLORIDE 0.9 % IV SOLN
510.0000 mg | Freq: Once | INTRAVENOUS | Status: AC
  Administered 2018-01-04: 510 mg via INTRAVENOUS
  Filled 2018-01-04: qty 17

## 2018-01-05 ENCOUNTER — Other Ambulatory Visit: Payer: Self-pay | Admitting: Family Medicine

## 2018-01-05 NOTE — Telephone Encounter (Signed)
Last OV 07/22/17 last filled 01/0/19 90 1rf

## 2018-01-24 ENCOUNTER — Other Ambulatory Visit: Payer: Self-pay | Admitting: Family Medicine

## 2018-02-02 ENCOUNTER — Telehealth: Payer: Self-pay | Admitting: Internal Medicine

## 2018-02-02 MED ORDER — PRAVASTATIN SODIUM 40 MG PO TABS: 20.0000 mg | ORAL_TABLET | Freq: Every day | ORAL | 3 refills | Status: DC

## 2018-02-02 NOTE — Telephone Encounter (Signed)
Copied from [place] [id number]. Topic: Quick Communication - See Telephone Encounter >> Feb 02, 2018  3:51 PM Conception Chancy, NT wrote: CRM for notification. See Telephone encounter for: 02/02/18.  [facility] is calling and requesting a refill on pravastatin (PRAVACHOL) 40 MG tablet  for the patient.   [facility] [place] [address] [facility] [address] [place] [address] Phone: [phone] Fax: [phone]   Cb# [phone] Reference # [id number]

## 2018-02-02 NOTE — Telephone Encounter (Signed)
This was last filled by Dr. Lacinda Axon on 02-09-17 Patient saw Dr. Aundra Dubin on 07-22-17 Will she refill?

## 2018-02-10 DIAGNOSIS — M1712 Unilateral primary osteoarthritis, left knee: Secondary | ICD-10-CM | POA: Diagnosis not present

## 2018-02-10 DIAGNOSIS — M1612 Unilateral primary osteoarthritis, left hip: Secondary | ICD-10-CM | POA: Diagnosis not present

## 2018-03-02 ENCOUNTER — Encounter: Payer: Self-pay | Admitting: Internal Medicine

## 2018-03-25 NOTE — Progress Notes (Deleted)
Janet Rice  Telephone:([phone]) [phone]  Fax:(336) [phone]     ORA BOLLIG DOB: August 01, 1945  MR#: [id number]  WCH#:[id number]  Patient Care Team: McLean-Scocuzza, Nino Glow, MD as PCP - General (Internal Medicine)  CHIEF COMPLAINT: Iron deficiency anemia.  INTERVAL HISTORY: Patient returns to clinic today for further evaluation, discussion of her laboratory work, and consideration of additional IV Feraheme.  She continues to have chronic weakness and fatigue, but otherwise feels well.  She has no neurologic complaints. She denies any recent fevers or illnesses. She has a good appetite and denies weight loss. She denies any chest pain or shortness of breath. She denies any nausea, vomiting, constipation, or diarrhea. She has no melena or hematochezia. She has no urinary complaints.  Patient offers no further specific complaints today.  REVIEW OF SYSTEMS:   Review of Systems  Constitutional: Positive for malaise/fatigue. Negative for fever and weight loss.  Eyes: Negative.   Respiratory: Negative.  Negative for cough and shortness of breath.   Cardiovascular: Negative.  Negative for chest pain and leg swelling.  Gastrointestinal: Negative.  Negative for abdominal pain, blood in stool and melena.  Genitourinary: Negative.  Negative for hematuria.  Musculoskeletal: Positive for back pain and joint pain.  Skin: Negative.  Negative for rash.  Neurological: Positive for weakness. Negative for sensory change and focal weakness.  Psychiatric/Behavioral: Negative.  The patient is not nervous/anxious.     As per HPI. Otherwise, a complete review of systems is negative.  ONCOLOGY HISTORY:  No history exists.    PAST MEDICAL HISTORY: Past Medical History:  Diagnosis Date  . Adenomatous colon polyp   . Arthritis   . Childhood asthma    AS A CHILD  . Chronic heartburn    On omeprazole  . Complication of anesthesia    HARD TO WAKE UP  . External hemorrhoids   . Frequent  loose stools   . GERD (gastroesophageal reflux disease)   . Heme positive stool   . Hyperlipidemia   . Hypothyroidism   . Iron deficiency anemia   . Melanoma ([place])    left thigh s/p LN removal left groin  . Metastatic melanoma ([place])    Followed by Dr Oliva Bustard, previous chemo, no recurrence, 2008?  . Multinodular goiter   . SCC (squamous cell carcinoma)    skin    PAST SURGICAL HISTORY: Past Surgical History:  Procedure Laterality Date  . ABDOMINAL HYSTERECTOMY  1984  . BACK SURGERY  03/2016   LUMBAR  . CARPAL TUNNEL RELEASE Right 09/07/2016   Procedure: CARPAL TUNNEL RELEASE;  Surgeon: Thornton Park, MD;  Location: [facility] ORS;  Service: Orthopedics;  Laterality: Right;  . CHOLECYSTECTOMY  1990  . COLONOSCOPY  08/2006   Four sessile polyps found and removed in sigmoid colon at splenic flexure and ascending colon. 7 mm in size. Another removed from transverse colon 4 mm. Path Report - showed tubular adenoma and hyperplastic polyp. Advised to repeat in 3.5 years (02/2010)  . COLONOSCOPY  3.2.2011   8 mm polyp in sigmoid colon, removed, 4 mm polyp in descending colon, removed. Internal hemorrhoids  . ESOPHAGOGASTRODUODENOSCOPY  3.2.2011   Large hiatia hernia, esophagus normal, mulitple small sessile polyps w/no stigmata of recent bleeding found. Mildy erythermatous mucosa w/no bleeding found in gatric antrum. Normal duodenum. 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 PATH - no active inflammation, antral mucosa w/mild foveolar hyperplasia  . FRACTURE SURGERY    . Lymph Node Removal  12/2005  Left inguinal lymph node dissection  . MELANOMA EXCISION Left 1993   Left thigh  . ORIF ANKLE FRACTURE Right    Dr. Mack Guise  . TOTAL HIP ARTHROPLASTY Right 11/18/2016   Procedure: TOTAL HIP ARTHROPLASTY;  Surgeon: Thornton Park, MD;  Location: [facility] ORS;  Service: Orthopedics;  Laterality: Right;    FAMILY HISTORY Family History  Problem Relation Age of Onset  . Aneurysm Mother     . Heart disease Mother   . Colon polyps Father   . Diabetes Father   . Dementia Father   . Stroke Father        TIA  . Leukemia Maternal Grandfather   . Skin cancer Paternal Grandfather   . Diabetes Other   . Colon polyps Other   . Colon cancer Neg Hx   . Breast cancer Neg Hx     GYNECOLOGIC HISTORY:  No LMP recorded. Patient has had a hysterectomy.     ADVANCED DIRECTIVES:    HEALTH MAINTENANCE: Social History   Tobacco Use  . Smoking status: Former Smoker    Packs/day: 0.50    Years: 8.00    Pack years: 4.00    Types: Cigarettes    Last attempt to quit: 07/12/1978    Years since quitting: 39.7  . Smokeless tobacco: Never Used  Substance Use Topics  . Alcohol use: No    Alcohol/week: 0.0 standard drinks  . Drug use: No    Allergies  Allergen Reactions  . Sulfa Antibiotics Rash    Current Outpatient Medications  Medication Sig Dispense Refill  . acetaminophen (TYLENOL 8 HOUR ARTHRITIS PAIN) 650 MG CR tablet Take 650 mg by mouth every 8 (eight) hours as needed for pain (taking 2 tablets once dail).    Marland Kitchen b complex vitamins tablet Take 1 tablet by mouth daily with lunch.    . Calcium Carb-Cholecalciferol (CALCIUM 600+D) 600-800 MG-UNIT TABS Take 1 tablet by mouth daily with lunch.    . celecoxib (CELEBREX) 200 MG capsule Take 200 mg by mouth daily.     . citalopram (CELEXA) 20 MG tablet TAKE 1 TABLET DAILY 90 tablet 2  . Coenzyme Q10 (COQ10) 50 MG CAPS Take 50 mg by mouth daily with lunch.    . Ferrous Sulfate Dried (SLOW RELEASE IRON) 45 MG TBCR Take 45 mg by mouth daily with lunch.    . levothyroxine (SYNTHROID, LEVOTHROID) 125 MCG tablet TAKE 1 TABLET DAILY BEFORE BREAKFAST 90 tablet 1  . loperamide (IMODIUM) 2 MG capsule Take 2 mg by mouth every morning.    . Multiple Vitamin (MULTIVITAMIN WITH MINERALS) TABS tablet Take 1 tablet by mouth daily with lunch.    . Multiple Vitamins-Minerals (EYE VITAMINS & MINERALS) TABS Take 1 tablet by mouth daily.     Marland Kitchen  omeprazole (PRILOSEC) 20 MG capsule TAKE 1 CAPSULE DAILY 90 capsule 0  . pravastatin (PRAVACHOL) 40 MG tablet Take 0.5 tablets (20 mg total) by mouth daily. 45 tablet 3  . Probiotic Product (PROBIOTIC PO) Take 1 capsule by mouth daily with breakfast.    . TURMERIC PO Take 1 capsule by mouth daily at 12 noon.     No current facility-administered medications for this visit.     OBJECTIVE: There were no vitals taken for this visit.   There is no height or weight on file to calculate BMI.    ECOG FS:0 - Asymptomatic  General: Well-developed, well-nourished, no acute distress. Eyes: Pink conjunctiva, anicteric sclera. Lungs: Clear to auscultation bilaterally. Heart:  Regular rate and rhythm. No rubs, murmurs, or gallops. Abdomen: Soft, nontender, nondistended. No organomegaly noted, normoactive bowel sounds. Musculoskeletal: No edema, cyanosis, or clubbing. Neuro: Alert, answering all questions appropriately. Cranial nerves grossly intact. Skin: No rashes or petechiae noted. Psych: Normal affect.  LAB RESULTS:  December 27, 2016: WBC 6.6, hemoglobin 10.4, platelet 144. Total iron 20, iron saturation 7%, ferritin 55. April 27, 2017: WBC 6.8, hemoglobin 11.3, platelets 151. Total iron 26, iron saturation 8%, ferritin 24. September 23, 2017: WBC 7.5, hemoglobin 10.9, platelet 165.  Total iron 18, iron saturation 5%, ferritin 21. December 22, 2017: WBC 6.9, hemoglobin 10.3, platelets 159.  Total iron 22, iron saturation 7%, ferritin 22.   STUDIES: No results found.  ASSESSMENT & PLAN:    1. Iron deficiency anemia: Patient reportedly had a normal colonoscopy in March 2016.  Despite receiving 2 infusions of IV Feraheme in March 2019, patient's hemoglobin and iron stores remain relatively unchanged.  Will proceed with 510 mg IV Feraheme today.  Return to clinic in 1 week for second infusion.  Patient will then return to clinic in 3 months with repeat laboratory work and further evaluation.   Patient gets  her labwork completed at [facility].  2. History of melanoma: Melanoma of left thigh status post resection in 1993, exact stage is not known, metastatic to inguinal lymph node. No evidence of recurrent disease. 3. Back Pain: Patient had surgery in September 2017.  4. Hypertension: Patient's blood pressure is moderately elevated today.  Continue monitoring and treatment per primary care.  I spent a total of 30 minutes face-to-face with the patient of which greater than 50% of the visit was spent in counseling and coordination of care as summarized above.  Patient expressed understanding and was in agreement with this plan. She also understands that She can call clinic at any time with any questions, concerns, or complaints.    Lloyd Huger, MD   03/25/2018 11:35 AM

## 2018-03-28 ENCOUNTER — Other Ambulatory Visit: Payer: Medicare Other

## 2018-03-28 ENCOUNTER — Inpatient Hospital Stay: Payer: Medicare Other

## 2018-03-28 ENCOUNTER — Inpatient Hospital Stay: Payer: Medicare Other | Admitting: Oncology

## 2018-04-10 ENCOUNTER — Telehealth: Payer: Self-pay | Admitting: *Deleted

## 2018-04-10 DIAGNOSIS — M1712 Unilateral primary osteoarthritis, left knee: Secondary | ICD-10-CM | POA: Diagnosis not present

## 2018-04-10 DIAGNOSIS — M7072 Other bursitis of hip, left hip: Secondary | ICD-10-CM | POA: Diagnosis not present

## 2018-04-10 NOTE — Telephone Encounter (Signed)
[facility] form ready, patient notified.

## 2018-04-10 NOTE — Telephone Encounter (Signed)
Patient needs orders for lab to be drawn at [facility] for her upcoming appointment. She would like to get labs drawn tomorrow. Please call patient when form ready to pick up. [phone]

## 2018-04-11 DIAGNOSIS — D509 Iron deficiency anemia, unspecified: Secondary | ICD-10-CM | POA: Diagnosis not present

## 2018-04-12 ENCOUNTER — Encounter: Payer: Self-pay | Admitting: Oncology

## 2018-04-16 NOTE — Progress Notes (Signed)
[place]  [phone]  [phone]     Janet Rice DOB: [birth year]  MR#: [id number]  VVO#:[id number]  Patient Care Team: McLean-Scocuzza, Nino Glow, MD as PCP - General (Internal Medicine)  CHIEF COMPLAINT: Iron deficiency anemia.  INTERVAL HISTORY: Patient returns to clinic today for further evaluation and consideration of additional IV Feraheme.  She continues to have chronic weakness and fatigue, but states this has significantly improved.  She otherwise feels well.  She has no neurologic complaints. She denies any recent fevers or illnesses. She has a good appetite and denies weight loss. She denies any chest pain or shortness of breath. She denies any nausea, vomiting, constipation, or diarrhea. She has no melena or hematochezia. She has no urinary complaints.  Patient offers no further specific complaints today.  REVIEW OF SYSTEMS:   Review of Systems  Constitutional: Positive for malaise/fatigue. Negative for fever and weight loss.  Eyes: Negative.   Respiratory: Negative.  Negative for cough and shortness of breath.   Cardiovascular: Negative.  Negative for chest pain and leg swelling.  Gastrointestinal: Negative.  Negative for abdominal pain, blood in stool and melena.  Genitourinary: Negative.  Negative for hematuria.  Musculoskeletal: Positive for back pain and joint pain.  Skin: Negative.  Negative for rash.  Neurological: Positive for weakness. Negative for sensory change and focal weakness.  Psychiatric/Behavioral: Negative.  The patient is not nervous/anxious.     As per HPI. Otherwise, a complete review of systems is negative.  ONCOLOGY HISTORY:  No history exists.    PAST MEDICAL HISTORY: Past Medical History:  Diagnosis Date  . Adenomatous colon polyp   . Arthritis   . Childhood asthma    AS A CHILD  . Chronic heartburn    On omeprazole  . Complication of anesthesia    HARD TO WAKE UP  . External hemorrhoids   .  Frequent loose stools   . GERD (gastroesophageal reflux disease)   . Heme positive stool   . Hyperlipidemia   . Hypothyroidism   . Iron deficiency anemia   . Melanoma ([place])    left thigh s/p LN removal left groin  . Metastatic melanoma ([facility])    Followed by Dr Oliva Bustard, previous chemo, no recurrence, 2008?  . Multinodular goiter   . SCC (squamous cell carcinoma)    skin    PAST SURGICAL HISTORY: Past Surgical History:  Procedure Laterality Date  . ABDOMINAL HYSTERECTOMY  1984  . BACK SURGERY  03/2016   LUMBAR  . CARPAL TUNNEL RELEASE Right 09/07/2016   Procedure: CARPAL TUNNEL RELEASE;  Surgeon: Thornton Park, MD;  Location: [facility] ORS;  Service: Orthopedics;  Laterality: Right;  . CHOLECYSTECTOMY  1990  . COLONOSCOPY  08/2006   Four sessile polyps found and removed in sigmoid colon at splenic flexure and ascending colon. 7 mm in size. Another removed from transverse colon 4 mm. Path Report - showed tubular adenoma and hyperplastic polyp. Advised to repeat in 3.5 years (02/2010)  . COLONOSCOPY  3.2.2011   8 mm polyp in sigmoid colon, removed, 4 mm polyp in descending colon, removed. Internal hemorrhoids  . ESOPHAGOGASTRODUODENOSCOPY  3.2.2011   Large hiatia hernia, esophagus normal, mulitple small sessile polyps w/no stigmata of recent bleeding found. Mildy erythermatous mucosa w/no bleeding found in gatric antrum. Normal duodenum. 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 PATH - no active inflammation, antral mucosa w/mild foveolar hyperplasia  . FRACTURE SURGERY    . Lymph Node Removal  12/2005  Left inguinal lymph node dissection  . MELANOMA EXCISION Left 1993   Left thigh  . ORIF ANKLE FRACTURE Right    Dr. Mack Guise  . TOTAL HIP ARTHROPLASTY Right 11/18/2016   Procedure: TOTAL HIP ARTHROPLASTY;  Surgeon: Thornton Park, MD;  Location: [facility] ORS;  Service: Orthopedics;  Laterality: Right;    FAMILY HISTORY Family History  Problem Relation Age of Onset  . Aneurysm  Mother   . Heart disease Mother   . Colon polyps Father   . Diabetes Father   . Dementia Father   . Stroke Father        TIA  . Leukemia Maternal Grandfather   . Skin cancer Paternal Grandfather   . Diabetes Other   . Colon polyps Other   . Colon cancer Neg Hx   . Breast cancer Neg Hx     GYNECOLOGIC HISTORY:  No LMP recorded. Patient has had a hysterectomy.     ADVANCED DIRECTIVES:    HEALTH MAINTENANCE: Social History   Tobacco Use  . Smoking status: Former Smoker    Packs/day: 0.50    Years: 8.00    Pack years: 4.00    Types: Cigarettes    Last attempt to quit: 07/12/1978    Years since quitting: 39.7  . Smokeless tobacco: Never Used  Substance Use Topics  . Alcohol use: No    Alcohol/week: 0.0 standard drinks  . Drug use: No    Allergies  Allergen Reactions  . Sulfa Antibiotics Rash    Current Outpatient Medications  Medication Sig Dispense Refill  . acetaminophen (TYLENOL 8 HOUR ARTHRITIS PAIN) 650 MG CR tablet Take 650 mg by mouth every 8 (eight) hours as needed for pain (taking 2 tablets once dail).    Marland Kitchen b complex vitamins tablet Take 1 tablet by mouth daily with lunch.    . Calcium Carb-Cholecalciferol (CALCIUM 600+D) 600-800 MG-UNIT TABS Take 1 tablet by mouth daily with lunch.    . celecoxib (CELEBREX) 200 MG capsule Take 200 mg by mouth daily.     . citalopram (CELEXA) 20 MG tablet TAKE 1 TABLET DAILY 90 tablet 2  . Coenzyme Q10 (COQ10) 50 MG CAPS Take 50 mg by mouth daily with lunch.    . Ferrous Sulfate Dried (SLOW RELEASE IRON) 45 MG TBCR Take 45 mg by mouth daily with lunch.    . levothyroxine (SYNTHROID, LEVOTHROID) 125 MCG tablet TAKE 1 TABLET DAILY BEFORE BREAKFAST 90 tablet 1  . loperamide (IMODIUM) 2 MG capsule Take 2 mg by mouth every morning.    . Multiple Vitamin (MULTIVITAMIN WITH MINERALS) TABS tablet Take 1 tablet by mouth daily with lunch.    . Multiple Vitamins-Minerals (EYE VITAMINS & MINERALS) TABS Take 1 tablet by mouth daily.       Marland Kitchen omeprazole (PRILOSEC) 20 MG capsule TAKE 1 CAPSULE DAILY 90 capsule 0  . pravastatin (PRAVACHOL) 40 MG tablet Take 0.5 tablets (20 mg total) by mouth daily. 45 tablet 3  . Probiotic Product (PROBIOTIC PO) Take 1 capsule by mouth daily with breakfast.    . TURMERIC PO Take 1 capsule by mouth daily at 12 noon.     No current facility-administered medications for this visit.     OBJECTIVE: BP 140/81 (BP Location: Left Wrist, Patient Position: Sitting)   Pulse 85   Temp 98.2 F (36.8 C) (Tympanic)   Resp 16   Ht 5\' 2"  (1.575 m)   Wt 213 lb 3.2 oz (96.7 kg)   BMI 38.99 kg/m  Body mass index is 38.99 kg/m.    ECOG FS:0 - Asymptomatic  General: Well-developed, well-nourished, no acute distress. Eyes: Pink conjunctiva, anicteric sclera. HEENT: Normocephalic, moist mucous membranes. Lungs: Clear to auscultation bilaterally. Heart: Regular rate and rhythm. No rubs, murmurs, or gallops. Abdomen: Soft, nontender, nondistended. No organomegaly noted, normoactive bowel sounds. Musculoskeletal: No edema, cyanosis, or clubbing. Neuro: Alert, answering all questions appropriately. Cranial nerves grossly intact. Skin: No rashes or petechiae noted. Psych: Normal affect.  LAB RESULTS:  December 27, 2016: WBC 6.6, hemoglobin 10.4, platelet 144. Total iron 20, iron saturation 7%, ferritin 55. April 27, 2017: WBC 6.8, hemoglobin 11.3, platelets 151. Total iron 26, iron saturation 8%, ferritin 24. September 23, 2017: WBC 7.5, hemoglobin 10.9, platelet 165.  Total iron 18, iron saturation 5%, ferritin 21. December 22, 2017: WBC 6.9, hemoglobin 10.3, platelets 159.  Total iron 22, iron saturation 7%, ferritin 22. April 11, 2018: WBC 8.1, hemoglobin 11.5, platelets 190.  Total iron 25, iron saturation 7%, ferrin 24.    STUDIES: No results found.  ASSESSMENT & PLAN:    1. Iron deficiency anemia: Patient reportedly had a normal colonoscopy in March 2016.  Patient's hemoglobin has significantly improved,  but her iron stores remain decreased.  She is also symptomatically improved.  Proceed with 1 additional infusion of 510 mg IV Feraheme today.  She does not require second infusion.  Return to clinic in 3 months with repeat laboratory work and further evaluation.  Patient [facility].  2. History of melanoma: Melanoma of left thigh status post resection in 1993, exact stage is not known, metastatic to inguinal lymph node. No evidence of recurrent disease. 3. Back Pain: Patient does not complain of this today.  Patient had surgery in September 2017.  4. Hypertension: Blood pressure is within normal limits today.  Continue monitoring and treatment per primary care.  I spent a total of 30 minutes face-to-face with the patient of which greater than 50% of the visit was spent in counseling and coordination of care as detailed above.   Patient expressed understanding and was in agreement with this plan. She also understands that She can call clinic at any time with any questions, concerns, or complaints.    Lloyd Huger, MD   04/18/2018 8:48 AM

## 2018-04-17 ENCOUNTER — Inpatient Hospital Stay: Payer: Medicare Other | Attending: Oncology | Admitting: Oncology

## 2018-04-17 ENCOUNTER — Encounter: Payer: Self-pay | Admitting: Oncology

## 2018-04-17 ENCOUNTER — Inpatient Hospital Stay: Payer: Medicare Other

## 2018-04-17 ENCOUNTER — Other Ambulatory Visit: Payer: Self-pay

## 2018-04-17 VITALS — BP 140/81 | HR 85 | Temp 98.2°F | Resp 16 | Ht 62.0 in | Wt 213.2 lb

## 2018-04-17 DIAGNOSIS — Z85820 Personal history of malignant melanoma of skin: Secondary | ICD-10-CM | POA: Diagnosis not present

## 2018-04-17 DIAGNOSIS — D508 Other iron deficiency anemias: Secondary | ICD-10-CM

## 2018-04-17 DIAGNOSIS — D509 Iron deficiency anemia, unspecified: Secondary | ICD-10-CM | POA: Diagnosis not present

## 2018-04-17 DIAGNOSIS — E039 Hypothyroidism, unspecified: Secondary | ICD-10-CM | POA: Insufficient documentation

## 2018-04-17 MED ORDER — SODIUM CHLORIDE 0.9 % IV SOLN
510.0000 mg | Freq: Once | INTRAVENOUS | Status: AC
  Administered 2018-04-17: 510 mg via INTRAVENOUS
  Filled 2018-04-17: qty 17

## 2018-04-17 MED ORDER — SODIUM CHLORIDE 0.9 % IV SOLN
INTRAVENOUS | Status: DC
  Administered 2018-04-17: 14:00:00 via INTRAVENOUS
  Filled 2018-04-17: qty 250

## 2018-04-17 NOTE — Progress Notes (Signed)
Patient reports that she is feeling very tired.

## 2018-04-18 DIAGNOSIS — M4150 Other secondary scoliosis, site unspecified: Secondary | ICD-10-CM | POA: Diagnosis not present

## 2018-04-19 ENCOUNTER — Telehealth: Payer: Self-pay | Admitting: Nurse Practitioner

## 2018-04-19 ENCOUNTER — Other Ambulatory Visit: Payer: Self-pay | Admitting: Neurosurgery

## 2018-04-19 DIAGNOSIS — M4180 Other forms of scoliosis, site unspecified: Secondary | ICD-10-CM

## 2018-04-19 DIAGNOSIS — M4150 Other secondary scoliosis, site unspecified: Secondary | ICD-10-CM

## 2018-04-19 NOTE — Telephone Encounter (Signed)
Phone call to patient to verify medication list and allergies for myelogram procedure. Pt instructed to hold celexa for 48 hrs prior to myelogram appointment time. Pt verbalized understanding.

## 2018-04-25 ENCOUNTER — Telehealth: Payer: Self-pay | Admitting: Internal Medicine

## 2018-04-25 ENCOUNTER — Other Ambulatory Visit: Payer: Self-pay | Admitting: Internal Medicine

## 2018-04-25 MED ORDER — OMEPRAZOLE 20 MG PO CPDR: 20.0000 mg | DELAYED_RELEASE_CAPSULE | Freq: Every day | ORAL | 1 refills | Status: DC

## 2018-04-25 NOTE — Telephone Encounter (Signed)
Please sch pt appt in 07/2018 this will be a year since Ive seen her  I need to see her sooner than 12/2018  [place]

## 2018-04-25 NOTE — Telephone Encounter (Signed)
Pt is scheduled for 07/25/2018.  

## 2018-05-03 ENCOUNTER — Other Ambulatory Visit: Payer: Medicare Other

## 2018-05-08 ENCOUNTER — Ambulatory Visit
Admission: RE | Admit: 2018-05-08 | Discharge: 2018-05-08 | Disposition: A | Discharge status: Home / Self Care | Payer: Medicare Other | Source: Ambulatory Visit | Attending: Neurosurgery | Admitting: Neurosurgery

## 2018-05-08 DIAGNOSIS — M4150 Other secondary scoliosis, site unspecified: Secondary | ICD-10-CM

## 2018-05-08 DIAGNOSIS — M4180 Other forms of scoliosis, site unspecified: Secondary | ICD-10-CM

## 2018-05-08 DIAGNOSIS — M545 Low back pain: Secondary | ICD-10-CM | POA: Diagnosis not present

## 2018-05-08 MED ORDER — IOPAMIDOL (ISOVUE-M 200) INJECTION 41%
15.0000 mL | Freq: Once | INTRAMUSCULAR | Status: AC
  Administered 2018-05-08: 15 mL via INTRATHECAL

## 2018-05-08 MED ORDER — DIAZEPAM 5 MG PO TABS
5.0000 mg | ORAL_TABLET | Freq: Once | ORAL | Status: AC
  Administered 2018-05-08: 5 mg via ORAL

## 2018-05-08 NOTE — Discharge Instructions (Signed)
Myelogram Discharge Instructions  1. Go home and rest quietly for the next 24 hours.  It is important to lie flat for the next 24 hours.  Get up only to go to the restroom.  You may lie in the bed or on a couch on your back, your stomach, your left side or your right side.  You may have one pillow under your head.  You may have pillows between your knees while you are on your side or under your knees while you are on your back.  2. DO NOT drive today.  Recline the seat as far back as it will go, while still wearing your seat belt, on the way home.  3. You may get up to go to the bathroom as needed.  You may sit up for 10 minutes to eat.  You may resume your normal diet and medications unless otherwise indicated.  Drink lots of extra fluids today and tomorrow.  4. The incidence of headache, nausea, or vomiting is about 5% (one in 20 patients).  If you develop a headache, lie flat and drink plenty of fluids until the headache goes away.  Caffeinated beverages may be helpful.  If you develop severe nausea and vomiting or a headache that does not go away with flat bed rest, call [phone].  5. You may resume normal activities after your 24 hours of bed rest is over; however, do not exert yourself strongly or do any heavy lifting tomorrow. If when you get up you have a headache when standing, go back to bed and force fluids for another 24 hours.  6. Call your physician for a follow-up appointment.  The results of your myelogram will be sent directly to your physician by the following day.  7. If you have any questions or if complications develop after you arrive home, please call [phone].  Discharge instructions have been explained to the patient.  The patient, or the person responsible for the patient, fully understands these instructions.   YOU MAY RESTART YOUR CELEXA TOMORROW 05/08/2018 AT 09:30AM.

## 2018-05-12 DIAGNOSIS — M48062 Spinal stenosis, lumbar region with neurogenic claudication: Secondary | ICD-10-CM | POA: Diagnosis not present

## 2018-05-12 DIAGNOSIS — M4150 Other secondary scoliosis, site unspecified: Secondary | ICD-10-CM | POA: Diagnosis not present

## 2018-05-15 ENCOUNTER — Other Ambulatory Visit: Payer: Self-pay | Admitting: Neurosurgery

## 2018-05-29 ENCOUNTER — Ambulatory Visit (INDEPENDENT_AMBULATORY_CARE_PROVIDER_SITE_OTHER): Payer: Medicare Other

## 2018-05-29 ENCOUNTER — Ambulatory Visit (INDEPENDENT_AMBULATORY_CARE_PROVIDER_SITE_OTHER): Payer: Medicare Other | Admitting: Family Medicine

## 2018-05-29 ENCOUNTER — Ambulatory Visit: Payer: Self-pay | Admitting: *Deleted

## 2018-05-29 ENCOUNTER — Encounter: Payer: Self-pay | Admitting: Family Medicine

## 2018-05-29 VITALS — BP 118/70 | HR 90 | Temp 98.5°F | Ht 62.0 in | Wt 218.2 lb

## 2018-05-29 DIAGNOSIS — R05 Cough: Secondary | ICD-10-CM | POA: Diagnosis not present

## 2018-05-29 DIAGNOSIS — R5383 Other fatigue: Secondary | ICD-10-CM

## 2018-05-29 DIAGNOSIS — R0602 Shortness of breath: Secondary | ICD-10-CM | POA: Diagnosis not present

## 2018-05-29 DIAGNOSIS — R059 Cough, unspecified: Secondary | ICD-10-CM

## 2018-05-29 DIAGNOSIS — D509 Iron deficiency anemia, unspecified: Secondary | ICD-10-CM | POA: Diagnosis not present

## 2018-05-29 NOTE — Progress Notes (Signed)
Subjective:    Patient ID: Janet Rice, female    DOB: 04/19/1946, 72 y.o.   MRN: [id number]  HPI  Patient presents to clinic c/o SOB for last week.  Patient denies any chest congestion or cough, but states it feels as if she cannot get in as good of a deep breath.  Patient reports she did have a fever over the weekend, did not measure her temperature, but states she felt hot so she knew she had a fever.  Patient also reports a decrease in her energy level, patient has chronic iron deficiency anemia as well as hypothyroidism.  She did have a headache yesterday, but this seems improved today.  Shortness of breath seems improved today as well.  Patient was a former smoker, but quit in 1980.  She only smoked for 4 pack years.  She has no breathing problems as far she knows.  States she does have some asthma as a young child, but nothing in her adult life.  Patient is concerned something that was could be going on, does not want anything to hold up her planned back surgery that is upcoming in December 2019.  Patient Active Problem List   Diagnosis Date Noted  . Arthralgia 07/25/2017  . Myalgia 07/25/2017  . Status post total hip replacement, right 11/18/2016  . Preoperative clearance 08/31/2016  . Hip osteoarthritis 08/31/2016  . Spondylolisthesis of lumbar region 04/08/2016  . Obesity (BMI 30-39.9) 07/18/2014  . DDD (degenerative disc disease), lumbar 12/04/2013  . Spinal stenosis of lumbar region 11/13/2013  . Carpal tunnel syndrome 11/13/2013  . Osteoporosis 08/15/2012  . Hypothyroidism 02/10/2012  . Iron deficiency anemia 08/13/2011  . Hyperlipidemia 08/13/2011  . Hx of adenomatous colonic polyps 06/08/2011   Social History   Tobacco Use  . Smoking status: Former Smoker    Packs/day: 0.50    Years: 8.00    Pack years: 4.00    Types: Cigarettes    Last attempt to quit: 07/12/1978    Years since quitting: 39.9  . Smokeless tobacco: Never Used  Substance Use Topics  .  Alcohol use: No    Alcohol/week: 0.0 standard drinks   Review of Systems  Constitutional: +decreased energy, fever. HENT: Negative for congestion, ear pain, sinus pain and sore throat.   Eyes: Negative.   Respiratory: +SOB, dry cough. No wheeze or chest congestion.  Cardiovascular: Negative for chest pain, palpitations and leg swelling.  Gastrointestinal: Negative for abdominal pain, diarrhea, nausea and vomiting.  Genitourinary: Negative for dysuria, frequency and urgency.  Musculoskeletal: Negative for arthralgias and myalgias.  Skin: Negative for color change, pallor and rash.  Neurological: Negative for syncope, light-headedness. +slight headache.  Psychiatric/Behavioral: The patient is not nervous/anxious.       Objective:   Physical Exam  Constitutional: She is oriented to person, place, and time.  Non-toxic appearance. She does not appear ill. No distress.  HENT:  Head: Normocephalic and atraumatic.  Mouth/Throat: Oropharynx is clear and moist.  Eyes: Pupils are equal, round, and reactive to light. EOM are normal.  Neck: Normal range of motion. Neck supple. No JVD present. No tracheal deviation present.  Cardiovascular: Normal rate, regular rhythm and normal heart sounds.  Pulmonary/Chest: Effort normal and breath sounds normal. No accessory muscle usage or stridor. No apnea and no tachypnea. No respiratory distress. She has no wheezes. She has no rhonchi. She has no rales.  Musculoskeletal:  Trace LE edema. Patient already wears compression hose.   Neurological: She is alert  and oriented to person, place, and time.  Skin: Skin is warm and dry. No pallor.  Psychiatric: She has a normal mood and affect. Her behavior is normal. Her mood appears not anxious.      Vitals:   05/29/18 1356  BP: 118/70  Pulse: 90  Temp: 98.5 F (36.9 C)  SpO2: 95%   Assessment & Plan:   Shortness of breath, cough-we will get chest x-ray in clinic to further investigate.  Patient did  report fever at home, but did not take her temperature at the time.  She is afebrile in clinic.  Her exam is benign.  She seems in no distress.  Fatigue-could be related to hypothyroidism and/or iron deficiency anemia.  Iron deficiency anemia-patient takes iron supplement daily.  Anemia could be adding to her fatigue symptoms.  CBC, iron studies, bmet, TSH lab work drawn to further investigate cause of her SOB and fatigue.  Her symptoms do seem improved today, but our work-up will ensure we are ruling out other possible causes.  Discussed with patient that the shortness of breath/cough/fatigue could be all related to a mild viral illness that is slowly resolving on its own.  Patient aware she will be contacted with imaging and lab results.  Advised to return to clinic as regularly scheduled, and return sooner if issues arise.

## 2018-05-29 NOTE — Telephone Encounter (Signed)
An FYI

## 2018-05-29 NOTE — Telephone Encounter (Signed)
Has appointment with NP at :60 today FYI.

## 2018-05-29 NOTE — Telephone Encounter (Signed)
Patient is calling with concerns of shortness of breath with exertion. Patient has a history of childhood asthma and she states she has been running a low grade fever for a while that comes and goes. She otherwise has no symptoms. Patient has no chest pain or congestion. Appointment made for evaluation of new symptom.  Reason for Disposition . [1] MILD difficulty breathing (e.g., minimal/no SOB at rest, SOB with walking, pulse <100) AND [2] NEW-onset or WORSE than normal  Answer Assessment - Initial Assessment Questions 1. RESPIRATORY STATUS: "Describe your breathing?" (e.g., wheezing, shortness of breath, unable to speak, severe coughing)      Patient has chest tightness and SOB with walking 2. ONSET: "When did this breathing problem begin?"      TH/FR of last week 3. PATTERN "Does the difficult breathing come and go, or has it been constant since it started?"      Constant- getting worse 4. SEVERITY: "How bad is your breathing?" (e.g., mild, moderate, severe)    - MILD: No SOB at rest, mild SOB with walking, speaks normally in sentences, can lay down, no retractions, pulse < 100.    - MODERATE: SOB at rest, SOB with minimal exertion and prefers to sit, cannot lie down flat, speaks in phrases, mild retractions, audible wheezing, pulse 100-120.    - SEVERE: Very SOB at rest, speaks in single words, struggling to breathe, sitting hunched forward, retractions, pulse > 120      Mild- BP- 141/65 P 77 5. RECURRENT SYMPTOM: "Have you had difficulty breathing before?" If so, ask: "When was the last time?" and "What happened that time?"      no 6. CARDIAC HISTORY: "Do you have any history of heart disease?" (e.g., heart attack, angina, bypass surgery, angioplasty)      no 7. LUNG HISTORY: "Do you have any history of lung disease?"  (e.g., pulmonary embolus, asthma, emphysema)     no 8. CAUSE: "What do you think is causing the breathing problem?"      No idea 9. OTHER SYMPTOMS: "Do you have any other  symptoms? (e.g., dizziness, runny nose, cough, chest pain, fever)     Fever- intermittent low grade 10. PREGNANCY: "Is there any chance you are pregnant?" "When was your last menstrual period?"       n/a 11. TRAVEL: "Have you traveled out of the country in the last month?" (e.g., travel history, exposures)       n/a  Protocols used: BREATHING DIFFICULTY-A-AH

## 2018-05-30 ENCOUNTER — Encounter: Payer: Self-pay | Admitting: Family Medicine

## 2018-05-30 DIAGNOSIS — R5383 Other fatigue: Secondary | ICD-10-CM

## 2018-05-30 DIAGNOSIS — R0602 Shortness of breath: Secondary | ICD-10-CM

## 2018-05-30 DIAGNOSIS — D509 Iron deficiency anemia, unspecified: Secondary | ICD-10-CM

## 2018-05-30 LAB — CBC WITH DIFFERENTIAL/PLATELET
Basophils Absolute: 0 10*3/uL (ref 0.0–0.2)
Basos: 1 %
EOS (ABSOLUTE): 0.1 10*3/uL (ref 0.0–0.4)
Eos: 1 %
Hematocrit: 24.2 % — ABNORMAL LOW (ref 34.0–46.6)
Hemoglobin: 7.5 g/dL — ABNORMAL LOW (ref 11.1–15.9)
Immature Grans (Abs): 0 10*3/uL (ref 0.0–0.1)
Immature Granulocytes: 0 %
Lymphocytes Absolute: 1.5 10*3/uL (ref 0.7–3.1)
Lymphs: 21 %
MCH: 24.9 pg — ABNORMAL LOW (ref 26.6–33.0)
MCHC: 31 g/dL — ABNORMAL LOW (ref 31.5–35.7)
MCV: 80 fL (ref 79–97)
Monocytes Absolute: 0.5 10*3/uL (ref 0.1–0.9)
Monocytes: 7 %
Neutrophils Absolute: 4.8 10*3/uL (ref 1.4–7.0)
Neutrophils: 70 %
Platelets: 170 10*3/uL (ref 150–450)
RBC: 3.01 x10E6/uL — ABNORMAL LOW (ref 3.77–5.28)
RDW: 17.1 % — ABNORMAL HIGH (ref 12.3–15.4)
WBC: 7 10*3/uL (ref 3.4–10.8)

## 2018-05-30 LAB — BASIC METABOLIC PANEL
BUN/Creatinine Ratio: 12 (ref 12–28)
BUN: 11 mg/dL (ref 8–27)
CO2: 27 mmol/L (ref 20–29)
Calcium: 8.9 mg/dL (ref 8.7–10.3)
Chloride: 104 mmol/L (ref 96–106)
Creatinine, Ser: 0.92 mg/dL (ref 0.57–1.00)
GFR calc Af Amer: 72 mL/min/{1.73_m2} (ref >59)
GFR calc non Af Amer: 62 mL/min/{1.73_m2} (ref >59)
Glucose: 83 mg/dL (ref 65–99)
Potassium: 5.1 mmol/L (ref 3.5–5.2)
Sodium: 143 mmol/L (ref 134–144)

## 2018-05-30 LAB — IRON,TIBC AND FERRITIN PANEL
Ferritin: 21 ng/mL (ref 15–150)
Iron Saturation: 4 % — CL (ref 15–55)
Iron: 12 ug/dL — ABNORMAL LOW (ref 27–139)
Total Iron Binding Capacity: 329 ug/dL (ref 250–450)
UIBC: 317 ug/dL (ref 118–369)

## 2018-05-30 LAB — TSH: TSH: 0.671 u[IU]/mL (ref 0.450–4.500)

## 2018-05-31 ENCOUNTER — Other Ambulatory Visit: Payer: Self-pay | Admitting: Neurosurgery

## 2018-05-31 NOTE — Pre-Procedure Instructions (Signed)
Janet Rice  05/31/2018      [id number] [place], [place] - [address] [place] [address] Phone: [phone] Fax: [phone]  [facility] [place] [place] [facility] [address] [place] [address] Phone: [phone] Fax: [phone]    Your procedure is scheduled on Monday, Dec. 2nd   Report to [facility] Admitting at  5:30 AM             (posted surgery time 7:30a - 11:08a)   Call this number if you have problems the morning of surgery:  [phone]   Remember:   Do not eat any foods or drink any liquids after midnight, Sunday.    5 days prior to surgery, STOP TAKING any Vitamins, Herbal Supplements, Anti-inflammatories.   Take these medicines the morning of surgery with A SIP OF WATER : Celexa, Levothyroxine, Omeprazole.    Do not wear jewelry, make-up or nail polish.  Do not wear lotions, powders,  perfumes, or deodorant.  Do not shave 48 hours prior to surgery.    Do not bring valuables to the hospital.  [facility] is not responsible for any belongings or valuables.  Contacts, dentures or bridgework may not be worn into surgery.  Leave your suitcase in the car.  After surgery it may be brought to your room.  For patients admitted to the hospital, discharge time will be determined by your treatment team.  Please read over the following fact sheets that you were given. Pain Booklet, MRSA Information and Surgical Site Infection Prevention       [facility]- Preparing For Surgery  Before surgery, you can play an important role. Because skin is not sterile, your skin needs to be as free of germs as possible. You can reduce the number of germs on your skin by washing with CHG (chlorahexidine gluconate) Soap before surgery.  CHG is an antiseptic cleaner which kills germs and bonds with the skin to continue killing germs even after washing.     Oral Hygiene is also important to reduce your risk of infection.    Remember - BRUSH YOUR TEETH THE MORNING OF SURGERY WITH YOUR REGULAR TOOTHPASTE  Please do not use if you have an allergy to CHG or antibacterial soaps. If your skin becomes reddened/irritated stop using the CHG.  Do not shave (including legs and underarms) for at least 48 hours prior to first CHG shower. It is OK to shave your face.  Please follow these instructions carefully.   1. Shower the NIGHT BEFORE SURGERY and the MORNING OF SURGERY with CHG.   2. If you chose to wash your hair, wash your hair first as usual with your normal shampoo.  3. After you shampoo, rinse your hair and body thoroughly to remove the shampoo.  4. Use CHG as you would any other liquid soap. You can apply CHG directly to the skin and wash gently with a scrungie or a clean washcloth.   5. Apply the CHG Soap to your body ONLY FROM THE NECK DOWN.  Do not use on open wounds or open sores. Avoid contact with your eyes, ears, mouth and genitals (private parts). Wash Face and genitals (private parts)  with your normal soap.  6. Wash thoroughly, paying special attention to the area where your surgery will be performed.  7. Thoroughly rinse your body with warm water from  the neck down.  8. DO NOT shower/wash with your normal soap after using and rinsing off the CHG Soap.  9. Pat yourself dry with a CLEAN TOWEL.  10. Wear CLEAN PAJAMAS to bed the night before surgery, wear comfortable clothes the morning of surgery  11. Place CLEAN SHEETS on your bed the night of your first shower and DO NOT SLEEP WITH PETS.  Day of Surgery:  Do not apply any deodorants/lotions.  Please wear clean clothes to the hospital/surgery center.   Remember to brush your teeth WITH YOUR REGULAR TOOTHPASTE.  RE-READ OVER THIS INFORMATION PACKET PLEASE

## 2018-06-01 ENCOUNTER — Encounter (HOSPITAL_COMMUNITY): Payer: Self-pay

## 2018-06-01 ENCOUNTER — Encounter (HOSPITAL_COMMUNITY)
Admission: RE | Admit: 2018-06-01 | Discharge: 2018-06-01 | Disposition: A | Discharge status: Home / Self Care | Payer: Medicare Other | Source: Ambulatory Visit | Attending: Neurosurgery | Admitting: Neurosurgery

## 2018-06-01 ENCOUNTER — Other Ambulatory Visit: Payer: Self-pay

## 2018-06-01 DIAGNOSIS — Z01818 Encounter for other preprocedural examination: Secondary | ICD-10-CM | POA: Diagnosis not present

## 2018-06-01 DIAGNOSIS — R9431 Abnormal electrocardiogram [ECG] [EKG]: Secondary | ICD-10-CM | POA: Diagnosis not present

## 2018-06-01 HISTORY — DX: Frequency of micturition: R35.0

## 2018-06-01 HISTORY — DX: Personal history of other diseases of the digestive system: Z87.19

## 2018-06-01 HISTORY — DX: Dyspnea, unspecified: R06.00

## 2018-06-01 LAB — TYPE AND SCREEN
ABO/RH(D): AB NEG
Antibody Screen: NEGATIVE

## 2018-06-01 LAB — SURGICAL PCR SCREEN
MRSA, PCR: NEGATIVE
Staphylococcus aureus: NEGATIVE

## 2018-06-01 NOTE — Progress Notes (Addendum)
PCP  Olivia Mackie McLean-Scocuzza  Dr. Delight Hoh  Office visit 04-18-2018  Results of labs done on 05-29-2018 called to Dr. Arnoldo Morale office   Denies any history of cardiac problems . Never seen a cardiologist. ECHO done 07-2017

## 2018-06-02 ENCOUNTER — Inpatient Hospital Stay (HOSPITAL_COMMUNITY): Admission: RE | Admit: 2018-06-02 | Payer: Medicare Other | Source: Ambulatory Visit

## 2018-06-02 NOTE — Telephone Encounter (Signed)
Copied from [place] [id number]. Topic: Quick Communication - See Telephone Encounter >> Jun 02, 2018  2:52 PM Reyne Dumas L wrote: CRM for notification. See Telephone encounter for: 06/02/18.  Pt states she hasn't heard anything from Dr. Gary Fleet office [facility]) and is concerned that this is going to interfere with her surgery (spinal surgery by Dr. Arnoldo Morale in [place]) scheduled for 12/02.  Pt would like to hear from someone. Pt can be reached at [phone]

## 2018-06-05 ENCOUNTER — Telehealth: Payer: Self-pay | Admitting: Family Medicine

## 2018-06-05 NOTE — Telephone Encounter (Signed)
Please call___  Stated in my chart she is feeling more fatigued and SOB than usual  Her blood counts were low with last labs but her symptoms sound worse in her message  Please call to let her know if she is feeling worse she needs to go to ER for evaluation

## 2018-06-06 ENCOUNTER — Other Ambulatory Visit: Payer: Self-pay | Admitting: Oncology

## 2018-06-06 ENCOUNTER — Inpatient Hospital Stay: Payer: Medicare Other | Attending: Oncology

## 2018-06-06 VITALS — BP 108/65 | HR 85 | Temp 98.7°F | Resp 20

## 2018-06-06 DIAGNOSIS — D508 Other iron deficiency anemias: Secondary | ICD-10-CM

## 2018-06-06 DIAGNOSIS — D509 Iron deficiency anemia, unspecified: Secondary | ICD-10-CM | POA: Insufficient documentation

## 2018-06-06 MED ORDER — SODIUM CHLORIDE 0.9 % IV SOLN
510.0000 mg | Freq: Once | INTRAVENOUS | Status: AC
  Administered 2018-06-06: 510 mg via INTRAVENOUS
  Filled 2018-06-06: qty 17

## 2018-06-06 MED ORDER — SODIUM CHLORIDE 0.9 % IV SOLN
INTRAVENOUS | Status: DC
  Administered 2018-06-06: 14:00:00 via INTRAVENOUS
  Filled 2018-06-06: qty 250

## 2018-06-06 NOTE — Telephone Encounter (Signed)
Patient has appt today.

## 2018-06-06 NOTE — Telephone Encounter (Signed)
[place] with Colette at Dr Grayland Ormond office she will send message to Dr Grayland Ormond nurse to see if they can see the Pt sooner than 12/3 pt already has appt scheduled. Referral was sent internal.

## 2018-06-06 NOTE — Telephone Encounter (Signed)
I spoke with Colette at Dr Grayland Ormond office she will send message to Dr Grayland Ormond nurse to see if they can see her sooner than 12/3 pt already has appt scheduled. Referral was sent internal.

## 2018-06-11 NOTE — Progress Notes (Signed)
[place]  [phone]  [phone]     Janet Rice DOB: 1945/12/24  MR#: [id number]  VOJ#:[id number]  Patient Care Team: McLean-Scocuzza, Nino Glow, MD as PCP - General (Internal Medicine)  CHIEF COMPLAINT: Iron deficiency anemia.  INTERVAL HISTORY: Patient returns to clinic today for routine evaluation and consideration of IV Feraheme.  She continues to have chronic weakness and fatigue as well as dyspnea on exertion.  She also has chronic back pain. She has no neurologic complaints. She denies any recent fevers or illnesses. She has a good appetite and denies weight loss.  She denies any chest pain, cough, or hemoptysis.  She denies any nausea, vomiting, constipation, or diarrhea. She has no melena or hematochezia. She has no urinary complaints.  Patient offers no further specific complaints today.  REVIEW OF SYSTEMS:   Review of Systems  Constitutional: Positive for malaise/fatigue. Negative for fever and weight loss.  Eyes: Negative.   Respiratory: Negative.  Negative for cough and shortness of breath.   Cardiovascular: Negative.  Negative for chest pain and leg swelling.  Gastrointestinal: Negative.  Negative for abdominal pain, blood in stool and melena.  Genitourinary: Negative.  Negative for hematuria.  Musculoskeletal: Positive for back pain and joint pain.  Skin: Negative.  Negative for rash.  Neurological: Positive for weakness. Negative for sensory change and focal weakness.  Psychiatric/Behavioral: Negative.  The patient is not nervous/anxious.     As per HPI. Otherwise, a complete review of systems is negative.  ONCOLOGY HISTORY:  No history exists.    PAST MEDICAL HISTORY: Past Medical History:  Diagnosis Date  . Adenomatous colon polyp   . Arthritis   . Childhood asthma    AS A CHILD  . Chronic heartburn    On omeprazole  . Complication of anesthesia    HARD TO WAKE UP  . Dyspnea    low hgb. and iron  chronic  problem  . External hemorrhoids   . Frequency of urination   . Frequent loose stools   . GERD (gastroesophageal reflux disease)   . Heme positive stool   . History of hiatal hernia   . Hyperlipidemia   . Hypothyroidism   . Iron deficiency anemia   . Melanoma ([place])    left thigh s/p LN removal left groin  . Metastatic melanoma ([place])    Followed by Dr Oliva Bustard, previous chemo, no recurrence, 2008?  . Multinodular goiter   . SCC (squamous cell carcinoma)    skin    PAST SURGICAL HISTORY: Past Surgical History:  Procedure Laterality Date  . ABDOMINAL HYSTERECTOMY  1984  . BACK SURGERY  03/2016   LUMBAR  . CARPAL TUNNEL RELEASE Right 09/07/2016   Procedure: CARPAL TUNNEL RELEASE;  Surgeon: Thornton Park, MD;  Location: [facility] ORS;  Service: Orthopedics;  Laterality: Right;  . CHOLECYSTECTOMY  1990  . COLONOSCOPY  08/2006   Four sessile polyps found and removed in sigmoid colon at splenic flexure and ascending colon. 7 mm in size. Another removed from transverse colon 4 mm. Path Report - showed tubular adenoma and hyperplastic polyp. Advised to repeat in 3.5 years (02/2010)  . COLONOSCOPY  3.2.2011   8 mm polyp in sigmoid colon, removed, 4 mm polyp in descending colon, removed. Internal hemorrhoids  . ESOPHAGOGASTRODUODENOSCOPY  3.2.2011   Large hiatia hernia, esophagus normal, mulitple small sessile polyps w/no stigmata of recent bleeding found. Mildy erythermatous mucosa w/no bleeding found in gatric antrum. Normal duodenum. Bx done of gastric  mucoal abnormalitiy and duodeunum. PATH - no active inflammation, antral mucosa w/mild foveolar hyperplasia  . FRACTURE SURGERY    . Lymph Node Removal  12/2005   Left inguinal lymph node dissection  . MELANOMA EXCISION Left 1993   Left thigh  . ORIF ANKLE FRACTURE Right    Dr. Mack Guise  . TOTAL HIP ARTHROPLASTY Right 11/18/2016   Procedure: TOTAL HIP ARTHROPLASTY;  Surgeon: Thornton Park, MD;  Location: [facility] ORS;  Service: Orthopedics;   Laterality: Right;    FAMILY HISTORY Family History  Problem Relation Age of Onset  . Aneurysm Mother   . Heart disease Mother   . Colon polyps Father   . Diabetes Father   . Dementia Father   . Stroke Father        TIA  . Leukemia Maternal Grandfather   . Skin cancer Paternal Grandfather   . Diabetes Other   . Colon polyps Other   . Colon cancer Neg Hx   . Breast cancer Neg Hx     GYNECOLOGIC HISTORY:  No LMP recorded. Patient has had a hysterectomy.     ADVANCED DIRECTIVES:    HEALTH MAINTENANCE: Social History   Tobacco Use  . Smoking status: Former Smoker    Packs/day: 0.50    Years: 8.00    Pack years: 4.00    Types: Cigarettes    Last attempt to quit: 07/12/1978    Years since quitting: 39.9  . Smokeless tobacco: Never Used  Substance Use Topics  . Alcohol use: No    Alcohol/week: 0.0 standard drinks  . Drug use: No    Allergies  Allergen Reactions  . Sulfa Antibiotics Rash    Current Outpatient Medications  Medication Sig Dispense Refill  . acetaminophen (TYLENOL 8 HOUR ARTHRITIS PAIN) 650 MG CR tablet Take 1,300 mg by mouth daily.     . Calcium Carb-Cholecalciferol (CALCIUM 600+D) 600-800 MG-UNIT TABS Take 1 tablet by mouth daily with lunch.    . celecoxib (CELEBREX) 200 MG capsule Take 200 mg by mouth daily.     . citalopram (CELEXA) 20 MG tablet TAKE 1 TABLET DAILY 90 tablet 2  . Ferrous Sulfate Dried (SLOW RELEASE IRON) 45 MG TBCR Take 45 mg by mouth daily with lunch.    . levothyroxine (SYNTHROID, LEVOTHROID) 125 MCG tablet TAKE 1 TABLET DAILY BEFORE BREAKFAST (Patient taking differently: Take 125 mcg by mouth daily before breakfast. ) 90 tablet 1  . loperamide (IMODIUM) 2 MG capsule Take 2 mg by mouth every morning.    . Multiple Vitamins-Minerals (EYE VITAMINS & MINERALS) TABS Take 1 tablet by mouth daily.     . NON FORMULARY Apply 1 application topically 2 (two) times daily as needed (muscle pain). Horse Liniment    . omeprazole (PRILOSEC) 20  MG capsule Take 1 capsule (20 mg total) by mouth daily. 90 capsule 1  . pravastatin (PRAVACHOL) 40 MG tablet Take 0.5 tablets (20 mg total) by mouth daily. 45 tablet 3  . Propylene Glycol (SYSTANE BALANCE OP) Place 1 drop into both eyes daily as needed (dry eyes).    . TURMERIC PO Take 1 capsule by mouth daily at 12 noon.     No current facility-administered medications for this visit.     OBJECTIVE: BP (!) 147/71 (BP Location: Left Arm, Patient Position: Sitting)   Pulse 79   Temp 97.9 F (36.6 C) (Tympanic)   Wt 216 lb 6.4 oz (98.2 kg)   SpO2 95%   BMI 39.58 kg/m  Body mass index is 39.58 kg/m.    ECOG FS:0 - Asymptomatic  General: Well-developed, well-nourished, no acute distress. Eyes: Pink conjunctiva, anicteric sclera. HEENT: Normocephalic, moist mucous membranes. Lungs: Clear to auscultation bilaterally. Heart: Regular rate and rhythm. No rubs, murmurs, or gallops. Abdomen: Soft, nontender, nondistended. No organomegaly noted, normoactive bowel sounds. Musculoskeletal: No edema, cyanosis, or clubbing. Neuro: Alert, answering all questions appropriately. Cranial nerves grossly intact. Skin: No rashes or petechiae noted. Psych: Normal affect.  LAB RESULTS:  December 27, 2016: WBC 6.6, hemoglobin 10.4, platelet 144. Total iron 20, iron saturation 7%, ferritin 55. April 27, 2017: WBC 6.8, hemoglobin 11.3, platelets 151. Total iron 26, iron saturation 8%, ferritin 24. September 23, 2017: WBC 7.5, hemoglobin 10.9, platelet 165.  Total iron 18, iron saturation 5%, ferritin 21. December 22, 2017: WBC 6.9, hemoglobin 10.3, platelets 159.  Total iron 22, iron saturation 7%, ferritin 22. April 11, 2018: WBC 8.1, hemoglobin 11.5, platelets 190.  Total iron 25, iron saturation 7%, ferrin 24.  CBC    Component Value Date/Time   WBC 7.0 05/29/2018 1410   WBC 9.5 11/20/2016 1458   RBC 3.01 (L) 05/29/2018 1410   RBC 2.84 (L) 11/20/2016 1458   HGB 7.5 (L) 05/29/2018 1410   HCT 24.2 (L)  05/29/2018 1410   PLT 170 05/29/2018 1410   MCV 80 05/29/2018 1410   MCV 74 (L) 12/12/2013 1015   MCH 24.9 (L) 05/29/2018 1410   MCH 29.1 11/20/2016 1458   MCHC 31.0 (L) 05/29/2018 1410   MCHC 33.9 11/20/2016 1458   RDW 17.1 (H) 05/29/2018 1410   RDW 33.6 (H) 12/12/2013 1015   LYMPHSABS 1.5 05/29/2018 1410   LYMPHSABS 1.2 12/12/2013 1015   MONOABS 0.4 11/03/2016 0956   MONOABS 0.4 12/12/2013 1015   EOSABS 0.1 05/29/2018 1410   EOSABS 0.1 12/12/2013 1015   BASOSABS 0.0 05/29/2018 1410   BASOSABS 0.1 12/12/2013 1015   Lab Results  Component Value Date   IRON 12 (L) 05/29/2018   TIBC 329 05/29/2018   IRONPCTSAT 4 (LL) 05/29/2018   Lab Results  Component Value Date   FERRITIN 21 05/29/2018      STUDIES: No results found.  ASSESSMENT & PLAN:    1. Iron deficiency anemia: Patient reportedly had a normal colonoscopy in March 2016.  Patient's hemoglobin and iron stores have trended down and she is more symptomatic.  She recently received 510 mg of IV Feraheme on June 06, 2018.  Proceed with a second infusion today.  Return to clinic in 3 months with repeat laboratory work and further evaluation.  Patient [facility].  2. History of melanoma: Melanoma of left thigh status post resection in 1993, exact stage is not known, metastatic to inguinal lymph node. No evidence of recurrent disease. 3. Back Pain: Chronic and unchanged.  Patient had surgery in September 2017.  4. Hypertension: Blood pressure is mildly elevated today.  Continue monitoring and treatment per primary care.  I spent a total of 30 minutes face-to-face with the patient of which greater than 50% of the visit was spent in counseling and coordination of care as detailed above.  Patient expressed understanding and was in agreement with this plan. She also understands that She can call clinic at any time with any questions, concerns, or complaints.    Lloyd Huger, MD   06/16/2018 6:44  AM

## 2018-06-13 ENCOUNTER — Other Ambulatory Visit: Payer: Self-pay

## 2018-06-13 ENCOUNTER — Inpatient Hospital Stay: Payer: Medicare Other | Attending: Oncology | Admitting: Oncology

## 2018-06-13 ENCOUNTER — Ambulatory Visit (INDEPENDENT_AMBULATORY_CARE_PROVIDER_SITE_OTHER): Payer: Medicare Other | Admitting: *Deleted

## 2018-06-13 ENCOUNTER — Inpatient Hospital Stay: Payer: Medicare Other

## 2018-06-13 VITALS — BP 112/60 | HR 58

## 2018-06-13 VITALS — BP 147/71 | HR 79 | Temp 97.9°F | Wt 216.4 lb

## 2018-06-13 DIAGNOSIS — Z85820 Personal history of malignant melanoma of skin: Secondary | ICD-10-CM | POA: Diagnosis not present

## 2018-06-13 DIAGNOSIS — R5383 Other fatigue: Secondary | ICD-10-CM | POA: Diagnosis not present

## 2018-06-13 DIAGNOSIS — I10 Essential (primary) hypertension: Secondary | ICD-10-CM

## 2018-06-13 DIAGNOSIS — Z23 Encounter for immunization: Secondary | ICD-10-CM | POA: Diagnosis not present

## 2018-06-13 DIAGNOSIS — M549 Dorsalgia, unspecified: Secondary | ICD-10-CM | POA: Diagnosis not present

## 2018-06-13 DIAGNOSIS — R531 Weakness: Secondary | ICD-10-CM

## 2018-06-13 DIAGNOSIS — R0609 Other forms of dyspnea: Secondary | ICD-10-CM

## 2018-06-13 DIAGNOSIS — D509 Iron deficiency anemia, unspecified: Secondary | ICD-10-CM

## 2018-06-13 DIAGNOSIS — G8929 Other chronic pain: Secondary | ICD-10-CM

## 2018-06-13 DIAGNOSIS — D508 Other iron deficiency anemias: Secondary | ICD-10-CM

## 2018-06-13 MED ORDER — SODIUM CHLORIDE 0.9 % IV SOLN
510.0000 mg | Freq: Once | INTRAVENOUS | Status: AC
  Administered 2018-06-13: 510 mg via INTRAVENOUS
  Filled 2018-06-13: qty 17

## 2018-06-13 MED ORDER — SODIUM CHLORIDE 0.9 % IV SOLN
Freq: Once | INTRAVENOUS | Status: AC
  Administered 2018-06-13: 12:00:00 via INTRAVENOUS
  Filled 2018-06-13: qty 250

## 2018-06-13 NOTE — Progress Notes (Signed)
Patient is here to follow up on her anemia. Patient stated that she had been having chronic back pain, headaches at least once a day, nausea, fever several days in a week, weakness, fatigued and short of breath on exertion (SpO2 95%).

## 2018-06-27 ENCOUNTER — Other Ambulatory Visit: Payer: Self-pay | Admitting: Neurosurgery

## 2018-06-28 DIAGNOSIS — M48062 Spinal stenosis, lumbar region with neurogenic claudication: Secondary | ICD-10-CM | POA: Diagnosis not present

## 2018-06-29 NOTE — Pre-Procedure Instructions (Signed)
EMBERLIN VERNER  06/29/2018      [id number] - [place], [place] - [address] [address] Phone: [phone] Fax: [phone]  [facility] [place] [place] [facility] [address] [place] [address] Phone: [phone] Fax: [phone]    Your procedure is scheduled on July 10, 2018.  Report to [facility] Admitting at 1140 AM.  Call this number if you have problems the morning of surgery:  [phone]   Remember:  Do not eat or drink after midnight.    Take these medicines the morning of surgery with A SIP OF WATER  Citalopram (celexa) Levothyroxine (synthroid) Omeprazole (prilosec) Eye drops-if needed Immodium-if needed  7 days prior to surgery STOP taking any Tylenol arthritis, Celebrex, Aleve, Naproxen, Ibuprofen, Motrin, Advil, Goody's, BC's, all herbal medications, fish oil, and all vitamins     Do not wear jewelry, make-up or nail polish.  Do not wear lotions, powders, or perfumes, or deodorant.  Do not shave 48 hours prior to surgery.  .  Do not bring valuables to the hospital.  [facility] is not responsible for any belongings or valuables.  Contacts, dentures or bridgework may not be worn into surgery.  Leave your suitcase in the car.  After surgery it may be brought to your room.  For patients admitted to the hospital, discharge time will be determined by your treatment team.  Patients discharged the day of surgery will not be allowed to drive home.    [place]- Preparing For Surgery  Before surgery, you can play an important role. Because skin is not sterile, your skin needs to be as free of germs as possible. You can reduce the number of germs on your skin by washing with CHG (chlorahexidine gluconate) Soap before surgery.  CHG is an antiseptic cleaner which kills germs and bonds with the skin to continue killing germs even after  washing.    Oral Hygiene is also important to reduce your risk of infection.  Remember - BRUSH YOUR TEETH THE MORNING OF SURGERY WITH YOUR REGULAR TOOTHPASTE  Please do not use if you have an allergy to CHG or antibacterial soaps. If your skin becomes reddened/irritated stop using the CHG.  Do not shave (including legs and underarms) for at least 48 hours prior to first CHG shower. It is OK to shave your face.  Please follow these instructions carefully.   1. Shower the NIGHT BEFORE SURGERY and the MORNING OF SURGERY with CHG.   2. If you chose to wash your hair, wash your hair first as usual with your normal shampoo.  3. After you shampoo, rinse your hair and body thoroughly to remove the shampoo.  4. Use CHG as you would any other liquid soap. You can apply CHG directly to the skin and wash gently with a scrungie or a clean washcloth.   5. Apply the CHG Soap to your body ONLY FROM THE NECK DOWN.  Do not use on open wounds or open sores. Avoid contact with your eyes, ears, mouth and genitals (private parts). Wash Face and genitals (private parts)  with your normal soap.  6. Wash thoroughly, paying special attention to the area where your surgery will be performed.  7. Thoroughly rinse your body with warm water from the neck down.  8. DO NOT shower/wash with your normal soap after using and rinsing off  the CHG Soap.  9. Pat yourself dry with a CLEAN TOWEL.  10. Wear CLEAN PAJAMAS to bed the night before surgery, wear comfortable clothes the morning of surgery  11. Place CLEAN SHEETS on your bed the night of your first shower and DO NOT SLEEP WITH PETS.  Day of Surgery:  Do not apply any deodorants/lotions.  Please wear clean clothes to the hospital/surgery center.   Remember to brush your teeth WITH YOUR REGULAR TOOTHPASTE.  Please read over the following fact sheets that you were given.

## 2018-06-29 NOTE — Progress Notes (Addendum)
  PCP - Orland Mustard, MD  Cardiologist - pt denies  Chest x-ray - 05/30/18 in Epic  EKG - 06/01/18 in Epic  Stress Test - pt denies past 5-10 years  ECHO - 07/2017  Cardiac Cath -  Pt denies  Sleep Study - n/a  CPAP - n/a  Fasting Blood Sugar - n/a/  Checks Blood Sugar _____ times a day-n/a  Blood Thinner Instructions: n/a  Aspirin Instructions: n/a  Anesthesia review:   Patient denies shortness of breath, fever, cough and chest pain at PAT appointment Patient verbalized understanding of instructions that were given to them at the PAT appointment. Patient was also instructed that they will need to review over the PAT instructions again at home before surgery.

## 2018-06-30 ENCOUNTER — Encounter (HOSPITAL_COMMUNITY): Payer: Self-pay

## 2018-06-30 ENCOUNTER — Other Ambulatory Visit: Payer: Self-pay

## 2018-06-30 ENCOUNTER — Encounter (HOSPITAL_COMMUNITY)
Admission: RE | Admit: 2018-06-30 | Discharge: 2018-06-30 | Disposition: A | Discharge status: Home / Self Care | Payer: Medicare Other | Source: Ambulatory Visit | Attending: Neurosurgery | Admitting: Neurosurgery

## 2018-06-30 DIAGNOSIS — Z01812 Encounter for preprocedural laboratory examination: Secondary | ICD-10-CM | POA: Diagnosis not present

## 2018-06-30 DIAGNOSIS — M48062 Spinal stenosis, lumbar region with neurogenic claudication: Secondary | ICD-10-CM | POA: Insufficient documentation

## 2018-06-30 LAB — BASIC METABOLIC PANEL
Anion gap: 11 (ref 5–15)
BUN: 8 mg/dL (ref 8–23)
CO2: 26 mmol/L (ref 22–32)
Calcium: 9.2 mg/dL (ref 8.9–10.3)
Chloride: 103 mmol/L (ref 98–111)
Creatinine, Ser: 0.94 mg/dL (ref 0.44–1.00)
GFR calc Af Amer: >60 mL/min (ref >60)
GFR calc non Af Amer: >60 mL/min (ref >60)
Glucose, Bld: 108 mg/dL — ABNORMAL HIGH (ref 70–99)
Potassium: 5 mmol/L (ref 3.5–5.1)
Sodium: 140 mmol/L (ref 135–145)

## 2018-06-30 LAB — SURGICAL PCR SCREEN
MRSA, PCR: NEGATIVE
Staphylococcus aureus: NEGATIVE

## 2018-06-30 LAB — TYPE AND SCREEN
ABO/RH(D): AB NEG
Antibody Screen: NEGATIVE

## 2018-06-30 LAB — CBC
HCT: 38.6 % (ref 36.0–46.0)
Hemoglobin: 11 g/dL — ABNORMAL LOW (ref 12.0–15.0)
MCH: 25.9 pg — ABNORMAL LOW (ref 26.0–34.0)
MCHC: 28.5 g/dL — ABNORMAL LOW (ref 30.0–36.0)
MCV: 91 fL (ref 80.0–100.0)
Platelets: 142 10*3/uL — ABNORMAL LOW (ref 150–400)
RBC: 4.24 MIL/uL (ref 3.87–5.11)
RDW: 18.4 % — ABNORMAL HIGH (ref 11.5–15.5)
WBC: 8 10*3/uL (ref 4.0–10.5)
nRBC: 0 % (ref 0.0–0.2)

## 2018-07-06 ENCOUNTER — Encounter: Payer: Self-pay | Admitting: Internal Medicine

## 2018-07-10 ENCOUNTER — Inpatient Hospital Stay (HOSPITAL_COMMUNITY): Payer: Medicare Other

## 2018-07-10 ENCOUNTER — Inpatient Hospital Stay (HOSPITAL_COMMUNITY)
Admission: RE | Admit: 2018-07-10 | Discharge: 2018-07-16 | DRG: 453 | Disposition: A | Discharge status: Home / Self Care | Payer: Medicare Other | Attending: Neurosurgery | Admitting: Neurosurgery

## 2018-07-10 ENCOUNTER — Encounter (HOSPITAL_COMMUNITY)
Admission: RE | Disposition: A | Discharge status: Home / Self Care | Payer: Self-pay | Source: Home / Self Care | Attending: Neurosurgery

## 2018-07-10 ENCOUNTER — Other Ambulatory Visit: Payer: Self-pay

## 2018-07-10 ENCOUNTER — Encounter (HOSPITAL_COMMUNITY): Payer: Self-pay

## 2018-07-10 ENCOUNTER — Inpatient Hospital Stay (HOSPITAL_COMMUNITY): Payer: Medicare Other | Admitting: Certified Registered"

## 2018-07-10 ENCOUNTER — Inpatient Hospital Stay (HOSPITAL_COMMUNITY): Payer: Medicare Other | Admitting: Physician Assistant

## 2018-07-10 DIAGNOSIS — Z86010 Personal history of colonic polyps: Secondary | ICD-10-CM

## 2018-07-10 DIAGNOSIS — J9621 Acute and chronic respiratory failure with hypoxia: Secondary | ICD-10-CM | POA: Diagnosis not present

## 2018-07-10 DIAGNOSIS — E785 Hyperlipidemia, unspecified: Secondary | ICD-10-CM | POA: Diagnosis present

## 2018-07-10 DIAGNOSIS — Z90710 Acquired absence of both cervix and uterus: Secondary | ICD-10-CM

## 2018-07-10 DIAGNOSIS — I5032 Chronic diastolic (congestive) heart failure: Secondary | ICD-10-CM | POA: Diagnosis present

## 2018-07-10 DIAGNOSIS — Z85828 Personal history of other malignant neoplasm of skin: Secondary | ICD-10-CM | POA: Diagnosis not present

## 2018-07-10 DIAGNOSIS — Z823 Family history of stroke: Secondary | ICD-10-CM | POA: Diagnosis not present

## 2018-07-10 DIAGNOSIS — Z882 Allergy status to sulfonamides status: Secondary | ICD-10-CM

## 2018-07-10 DIAGNOSIS — D509 Iron deficiency anemia, unspecified: Secondary | ICD-10-CM | POA: Diagnosis not present

## 2018-07-10 DIAGNOSIS — Z419 Encounter for procedure for purposes other than remedying health state, unspecified: Secondary | ICD-10-CM

## 2018-07-10 DIAGNOSIS — K219 Gastro-esophageal reflux disease without esophagitis: Secondary | ICD-10-CM | POA: Diagnosis not present

## 2018-07-10 DIAGNOSIS — M5116 Intervertebral disc disorders with radiculopathy, lumbar region: Secondary | ICD-10-CM | POA: Diagnosis not present

## 2018-07-10 DIAGNOSIS — Z79890 Hormone replacement therapy (postmenopausal): Secondary | ICD-10-CM

## 2018-07-10 DIAGNOSIS — Z808 Family history of malignant neoplasm of other organs or systems: Secondary | ICD-10-CM

## 2018-07-10 DIAGNOSIS — G960 Cerebrospinal fluid leak: Secondary | ICD-10-CM | POA: Diagnosis not present

## 2018-07-10 DIAGNOSIS — Y92234 Operating room of hospital as the place of occurrence of the external cause: Secondary | ICD-10-CM | POA: Diagnosis not present

## 2018-07-10 DIAGNOSIS — G9741 Accidental puncture or laceration of dura during a procedure: Secondary | ICD-10-CM | POA: Diagnosis not present

## 2018-07-10 DIAGNOSIS — Z87891 Personal history of nicotine dependence: Secondary | ICD-10-CM | POA: Diagnosis not present

## 2018-07-10 DIAGNOSIS — Z833 Family history of diabetes mellitus: Secondary | ICD-10-CM | POA: Diagnosis not present

## 2018-07-10 DIAGNOSIS — Z8371 Family history of colonic polyps: Secondary | ICD-10-CM | POA: Diagnosis not present

## 2018-07-10 DIAGNOSIS — Z96641 Presence of right artificial hip joint: Secondary | ICD-10-CM | POA: Diagnosis present

## 2018-07-10 DIAGNOSIS — J9811 Atelectasis: Secondary | ICD-10-CM | POA: Diagnosis not present

## 2018-07-10 DIAGNOSIS — Z9221 Personal history of antineoplastic chemotherapy: Secondary | ICD-10-CM

## 2018-07-10 DIAGNOSIS — R509 Fever, unspecified: Secondary | ICD-10-CM

## 2018-07-10 DIAGNOSIS — E039 Hypothyroidism, unspecified: Secondary | ICD-10-CM | POA: Diagnosis not present

## 2018-07-10 DIAGNOSIS — M48062 Spinal stenosis, lumbar region with neurogenic claudication: Principal | ICD-10-CM | POA: Diagnosis present

## 2018-07-10 DIAGNOSIS — M4326 Fusion of spine, lumbar region: Secondary | ICD-10-CM | POA: Diagnosis not present

## 2018-07-10 DIAGNOSIS — Z8249 Family history of ischemic heart disease and other diseases of the circulatory system: Secondary | ICD-10-CM | POA: Diagnosis not present

## 2018-07-10 DIAGNOSIS — M810 Age-related osteoporosis without current pathological fracture: Secondary | ICD-10-CM | POA: Diagnosis present

## 2018-07-10 DIAGNOSIS — R0902 Hypoxemia: Secondary | ICD-10-CM | POA: Diagnosis not present

## 2018-07-10 DIAGNOSIS — K449 Diaphragmatic hernia without obstruction or gangrene: Secondary | ICD-10-CM | POA: Diagnosis not present

## 2018-07-10 DIAGNOSIS — Z9049 Acquired absence of other specified parts of digestive tract: Secondary | ICD-10-CM | POA: Diagnosis not present

## 2018-07-10 DIAGNOSIS — G4733 Obstructive sleep apnea (adult) (pediatric): Secondary | ICD-10-CM | POA: Diagnosis present

## 2018-07-10 DIAGNOSIS — Z806 Family history of leukemia: Secondary | ICD-10-CM

## 2018-07-10 DIAGNOSIS — Z981 Arthrodesis status: Secondary | ICD-10-CM

## 2018-07-10 DIAGNOSIS — Z6839 Body mass index (BMI) 39.0-39.9, adult: Secondary | ICD-10-CM

## 2018-07-10 DIAGNOSIS — Z85820 Personal history of malignant melanoma of skin: Secondary | ICD-10-CM | POA: Diagnosis not present

## 2018-07-10 DIAGNOSIS — Y838 Other surgical procedures as the cause of abnormal reaction of the patient, or of later complication, without mention of misadventure at the time of the procedure: Secondary | ICD-10-CM | POA: Diagnosis not present

## 2018-07-10 DIAGNOSIS — M960 Pseudarthrosis after fusion or arthrodesis: Secondary | ICD-10-CM | POA: Diagnosis not present

## 2018-07-10 DIAGNOSIS — Z79899 Other long term (current) drug therapy: Secondary | ICD-10-CM

## 2018-07-10 SURGERY — POSTERIOR LUMBAR FUSION 1 LEVEL
Anesthesia: General | Site: Back

## 2018-07-10 MED ORDER — ONDANSETRON HCL 4 MG/2ML IJ SOLN
INTRAMUSCULAR | Status: DC | PRN
  Administered 2018-07-10: 4 mg via INTRAVENOUS

## 2018-07-10 MED ORDER — HEMOSTATIC AGENTS (NO CHARGE) OPTIME
TOPICAL | Status: DC | PRN
  Administered 2018-07-10: 1 via TOPICAL

## 2018-07-10 MED ORDER — CITALOPRAM HYDROBROMIDE 20 MG PO TABS
20.0000 mg | ORAL_TABLET | Freq: Every day | ORAL | Status: DC
  Administered 2018-07-10 – 2018-07-16 (×7): 20 mg via ORAL
  Filled 2018-07-10 (×7): qty 1

## 2018-07-10 MED ORDER — FENTANYL CITRATE (PF) 100 MCG/2ML IJ SOLN
25.0000 ug | INTRAMUSCULAR | Status: DC | PRN
  Administered 2018-07-10: 50 ug via INTRAVENOUS

## 2018-07-10 MED ORDER — LIDOCAINE 2% (20 MG/ML) 5 ML SYRINGE
INTRAMUSCULAR | Status: DC | PRN
  Administered 2018-07-10: 80 mg via INTRAVENOUS

## 2018-07-10 MED ORDER — CEFAZOLIN SODIUM-DEXTROSE 2-4 GM/100ML-% IV SOLN
2.0000 g | INTRAVENOUS | Status: AC
  Administered 2018-07-10: 2 g via INTRAVENOUS

## 2018-07-10 MED ORDER — ACETAMINOPHEN 10 MG/ML IV SOLN
INTRAVENOUS | Status: DC | PRN
  Administered 2018-07-10: 1000 mg via INTRAVENOUS

## 2018-07-10 MED ORDER — FERROUS SULFATE 325 (65 FE) MG PO TABS
325.0000 mg | ORAL_TABLET | Freq: Every day | ORAL | Status: DC
  Administered 2018-07-11 – 2018-07-16 (×6): 325 mg via ORAL
  Filled 2018-07-10 (×6): qty 1

## 2018-07-10 MED ORDER — FERROUS SULFATE DRIED ER 45 MG PO TBCR: 45.0000 mg | EXTENDED_RELEASE_TABLET | Freq: Every day | ORAL | Status: DC

## 2018-07-10 MED ORDER — MENTHOL 3 MG MT LOZG: 1.0000 | LOZENGE | OROMUCOSAL | Status: DC | PRN

## 2018-07-10 MED ORDER — DEXAMETHASONE SODIUM PHOSPHATE 10 MG/ML IJ SOLN
INTRAMUSCULAR | Status: DC | PRN
  Administered 2018-07-10: 10 mg via INTRAVENOUS

## 2018-07-10 MED ORDER — BUPIVACAINE LIPOSOME 1.3 % IJ SUSP: INTRAMUSCULAR | Status: DC | PRN

## 2018-07-10 MED ORDER — FENTANYL CITRATE (PF) 100 MCG/2ML IJ SOLN
INTRAMUSCULAR | Status: DC | PRN
  Administered 2018-07-10 (×2): 50 ug via INTRAVENOUS
  Administered 2018-07-10 (×2): 25 ug via INTRAVENOUS
  Administered 2018-07-10: 100 ug via INTRAVENOUS

## 2018-07-10 MED ORDER — ACETAMINOPHEN 500 MG PO TABS
1000.0000 mg | ORAL_TABLET | Freq: Four times a day (QID) | ORAL | Status: AC
  Administered 2018-07-10 – 2018-07-11 (×3): 1000 mg via ORAL
  Filled 2018-07-10 (×4): qty 2

## 2018-07-10 MED ORDER — CHLORHEXIDINE GLUCONATE CLOTH 2 % EX PADS: 6.0000 | MEDICATED_PAD | Freq: Once | CUTANEOUS | Status: DC

## 2018-07-10 MED ORDER — ADULT MULTIVITAMIN W/MINERALS CH
1.0000 | ORAL_TABLET | Freq: Every day | ORAL | Status: DC
  Administered 2018-07-11 – 2018-07-16 (×6): 1 via ORAL
  Filled 2018-07-10 (×6): qty 1

## 2018-07-10 MED ORDER — FENTANYL CITRATE (PF) 100 MCG/2ML IJ SOLN
INTRAMUSCULAR | Status: AC
  Administered 2018-07-10: 50 ug via INTRAVENOUS
  Filled 2018-07-10: qty 2

## 2018-07-10 MED ORDER — PROPOFOL 10 MG/ML IV BOLUS
INTRAVENOUS | Status: DC | PRN
  Administered 2018-07-10: 140 mg via INTRAVENOUS

## 2018-07-10 MED ORDER — VANCOMYCIN HCL 1000 MG IV SOLR
INTRAVENOUS | Status: AC
  Filled 2018-07-10: qty 1000

## 2018-07-10 MED ORDER — ACETAMINOPHEN 325 MG PO TABS
650.0000 mg | ORAL_TABLET | ORAL | Status: DC | PRN
  Administered 2018-07-12 (×2): 650 mg via ORAL
  Filled 2018-07-10 (×2): qty 2

## 2018-07-10 MED ORDER — MORPHINE SULFATE (PF) 4 MG/ML IV SOLN: 4.0000 mg | INTRAVENOUS | Status: DC | PRN

## 2018-07-10 MED ORDER — SODIUM CHLORIDE 0.9 % IV SOLN: INTRAVENOUS | Status: DC

## 2018-07-10 MED ORDER — LACTATED RINGERS IV SOLN
INTRAVENOUS | Status: DC | PRN
  Administered 2018-07-10: 14:00:00 via INTRAVENOUS

## 2018-07-10 MED ORDER — LACTATED RINGERS IV SOLN
INTRAVENOUS | Status: DC | PRN
  Administered 2018-07-10: 11:00:00 via INTRAVENOUS

## 2018-07-10 MED ORDER — LEVOTHYROXINE SODIUM 25 MCG PO TABS
125.0000 ug | ORAL_TABLET | Freq: Every day | ORAL | Status: DC
  Administered 2018-07-11 – 2018-07-16 (×6): 125 ug via ORAL
  Filled 2018-07-10 (×6): qty 1

## 2018-07-10 MED ORDER — EYE VITAMINS & MINERALS PO TABS: 1.0000 | ORAL_TABLET | Freq: Every day | ORAL | Status: DC

## 2018-07-10 MED ORDER — FENTANYL CITRATE (PF) 250 MCG/5ML IJ SOLN
INTRAMUSCULAR | Status: AC
  Filled 2018-07-10: qty 5

## 2018-07-10 MED ORDER — THROMBIN 20000 UNITS EX SOLR
CUTANEOUS | Status: DC | PRN
  Administered 2018-07-10: 15:00:00 via TOPICAL

## 2018-07-10 MED ORDER — PHENYLEPHRINE 40 MCG/ML (10ML) SYRINGE FOR IV PUSH (FOR BLOOD PRESSURE SUPPORT)
PREFILLED_SYRINGE | INTRAVENOUS | Status: AC
  Filled 2018-07-10: qty 20

## 2018-07-10 MED ORDER — ACETAMINOPHEN 160 MG/5ML PO SOLN: 1000.0000 mg | Freq: Once | ORAL | Status: DC | PRN

## 2018-07-10 MED ORDER — OXYCODONE HCL 5 MG PO TABS
10.0000 mg | ORAL_TABLET | ORAL | Status: DC | PRN
  Administered 2018-07-10 – 2018-07-14 (×8): 10 mg via ORAL
  Filled 2018-07-10 (×9): qty 2

## 2018-07-10 MED ORDER — ROCURONIUM BROMIDE 10 MG/ML (PF) SYRINGE
PREFILLED_SYRINGE | INTRAVENOUS | Status: DC | PRN
  Administered 2018-07-10: 30 mg via INTRAVENOUS
  Administered 2018-07-10: 50 mg via INTRAVENOUS
  Administered 2018-07-10: 10 mg via INTRAVENOUS

## 2018-07-10 MED ORDER — BUPIVACAINE-EPINEPHRINE (PF) 0.25% -1:200000 IJ SOLN
INTRAMUSCULAR | Status: DC | PRN
  Administered 2018-07-10: 10 mL

## 2018-07-10 MED ORDER — BUPIVACAINE LIPOSOME 1.3 % IJ SUSP
20.0000 mL | Freq: Once | INTRAMUSCULAR | Status: DC
  Filled 2018-07-10: qty 20

## 2018-07-10 MED ORDER — THROMBIN 5000 UNITS EX SOLR
CUTANEOUS | Status: AC
  Filled 2018-07-10: qty 5000

## 2018-07-10 MED ORDER — CEFAZOLIN SODIUM-DEXTROSE 2-4 GM/100ML-% IV SOLN
INTRAVENOUS | Status: AC
  Filled 2018-07-10: qty 100

## 2018-07-10 MED ORDER — CEFAZOLIN SODIUM-DEXTROSE 2-4 GM/100ML-% IV SOLN
2.0000 g | Freq: Three times a day (TID) | INTRAVENOUS | Status: AC
  Administered 2018-07-10 – 2018-07-11 (×2): 2 g via INTRAVENOUS
  Filled 2018-07-10 (×3): qty 100

## 2018-07-10 MED ORDER — SODIUM CHLORIDE 0.9% FLUSH
3.0000 mL | Freq: Two times a day (BID) | INTRAVENOUS | Status: DC
  Administered 2018-07-11: 3 mL via INTRAVENOUS

## 2018-07-10 MED ORDER — BACITRACIN ZINC 500 UNIT/GM EX OINT
TOPICAL_OINTMENT | CUTANEOUS | Status: AC
  Filled 2018-07-10: qty 28.35

## 2018-07-10 MED ORDER — OXYCODONE HCL 5 MG PO TABS: 5.0000 mg | ORAL_TABLET | Freq: Once | ORAL | Status: DC | PRN

## 2018-07-10 MED ORDER — BACITRACIN ZINC 500 UNIT/GM EX OINT
TOPICAL_OINTMENT | CUTANEOUS | Status: DC | PRN
  Administered 2018-07-10: 1 via TOPICAL

## 2018-07-10 MED ORDER — CYCLOBENZAPRINE HCL 10 MG PO TABS
10.0000 mg | ORAL_TABLET | Freq: Three times a day (TID) | ORAL | Status: DC | PRN
  Administered 2018-07-10 – 2018-07-16 (×12): 10 mg via ORAL
  Filled 2018-07-10 (×13): qty 1

## 2018-07-10 MED ORDER — GLYCOPYRROLATE PF 0.2 MG/ML IJ SOSY
PREFILLED_SYRINGE | INTRAMUSCULAR | Status: AC
  Filled 2018-07-10: qty 1

## 2018-07-10 MED ORDER — THROMBIN 5000 UNITS EX SOLR
OROMUCOSAL | Status: DC | PRN
  Administered 2018-07-10: 15:00:00 via TOPICAL

## 2018-07-10 MED ORDER — GLYCOPYRROLATE 0.2 MG/ML IJ SOLN
INTRAMUSCULAR | Status: DC | PRN
  Administered 2018-07-10: 0.1 mg via INTRAVENOUS

## 2018-07-10 MED ORDER — ACETAMINOPHEN 10 MG/ML IV SOLN: 1000.0000 mg | Freq: Once | INTRAVENOUS | Status: DC | PRN

## 2018-07-10 MED ORDER — PRAVASTATIN SODIUM 10 MG PO TABS
20.0000 mg | ORAL_TABLET | Freq: Every day | ORAL | Status: DC
  Administered 2018-07-10 – 2018-07-16 (×7): 20 mg via ORAL
  Filled 2018-07-10 (×8): qty 2

## 2018-07-10 MED ORDER — PHENOL 1.4 % MT LIQD: 1.0000 | OROMUCOSAL | Status: DC | PRN

## 2018-07-10 MED ORDER — BUPIVACAINE-EPINEPHRINE (PF) 0.25% -1:200000 IJ SOLN
INTRAMUSCULAR | Status: AC
  Filled 2018-07-10: qty 30

## 2018-07-10 MED ORDER — LIDOCAINE 2% (20 MG/ML) 5 ML SYRINGE
INTRAMUSCULAR | Status: AC
  Filled 2018-07-10: qty 5

## 2018-07-10 MED ORDER — ONDANSETRON HCL 4 MG/2ML IJ SOLN: 4.0000 mg | Freq: Four times a day (QID) | INTRAMUSCULAR | Status: DC | PRN

## 2018-07-10 MED ORDER — ARTIFICIAL TEARS OPHTHALMIC OINT
TOPICAL_OINTMENT | OPHTHALMIC | Status: AC
  Filled 2018-07-10: qty 3.5

## 2018-07-10 MED ORDER — PANTOPRAZOLE SODIUM 40 MG PO TBEC
40.0000 mg | DELAYED_RELEASE_TABLET | Freq: Every day | ORAL | Status: DC
  Administered 2018-07-10 – 2018-07-16 (×7): 40 mg via ORAL
  Filled 2018-07-10 (×8): qty 1

## 2018-07-10 MED ORDER — OXYCODONE HCL 5 MG/5ML PO SOLN: 5.0000 mg | Freq: Once | ORAL | Status: DC | PRN

## 2018-07-10 MED ORDER — ROCURONIUM BROMIDE 50 MG/5ML IV SOSY
PREFILLED_SYRINGE | INTRAVENOUS | Status: AC
  Filled 2018-07-10: qty 10

## 2018-07-10 MED ORDER — 0.9 % SODIUM CHLORIDE (POUR BTL) OPTIME
TOPICAL | Status: DC | PRN
  Administered 2018-07-10: 1000 mL

## 2018-07-10 MED ORDER — ACETAMINOPHEN 10 MG/ML IV SOLN
INTRAVENOUS | Status: AC
  Filled 2018-07-10: qty 100

## 2018-07-10 MED ORDER — ONDANSETRON HCL 4 MG PO TABS: 4.0000 mg | ORAL_TABLET | Freq: Four times a day (QID) | ORAL | Status: DC | PRN

## 2018-07-10 MED ORDER — BISACODYL 10 MG RE SUPP: 10.0000 mg | Freq: Every day | RECTAL | Status: DC | PRN

## 2018-07-10 MED ORDER — SODIUM CHLORIDE 0.9% FLUSH: 3.0000 mL | INTRAVENOUS | Status: DC | PRN

## 2018-07-10 MED ORDER — POLYVINYL ALCOHOL 1.4 % OP SOLN
1.0000 [drp] | OPHTHALMIC | Status: DC | PRN
  Filled 2018-07-10: qty 15

## 2018-07-10 MED ORDER — SODIUM CHLORIDE 0.9 % IV SOLN: 250.0000 mL | INTRAVENOUS | Status: DC

## 2018-07-10 MED ORDER — EPHEDRINE 5 MG/ML INJ
INTRAVENOUS | Status: AC
  Filled 2018-07-10: qty 10

## 2018-07-10 MED ORDER — OXYCODONE HCL 5 MG PO TABS
5.0000 mg | ORAL_TABLET | ORAL | Status: DC | PRN
  Administered 2018-07-11 – 2018-07-16 (×8): 5 mg via ORAL
  Filled 2018-07-10 (×10): qty 1

## 2018-07-10 MED ORDER — ACETAMINOPHEN 500 MG PO TABS: 1000.0000 mg | ORAL_TABLET | Freq: Once | ORAL | Status: DC | PRN

## 2018-07-10 MED ORDER — SODIUM CHLORIDE 0.9 % IV SOLN
INTRAVENOUS | Status: DC | PRN
  Administered 2018-07-10: 20 ug/min via INTRAVENOUS

## 2018-07-10 MED ORDER — ACETAMINOPHEN 650 MG RE SUPP: 650.0000 mg | RECTAL | Status: DC | PRN

## 2018-07-10 MED ORDER — THROMBIN 20000 UNITS EX SOLR
CUTANEOUS | Status: AC
  Filled 2018-07-10: qty 20000

## 2018-07-10 MED ORDER — PROPYLENE GLYCOL 0.6 % OP SOLN: Freq: Every day | OPHTHALMIC | Status: DC | PRN

## 2018-07-10 MED ORDER — SODIUM CHLORIDE 0.9 % IV SOLN
INTRAVENOUS | Status: DC | PRN
  Administered 2018-07-10: 15:00:00

## 2018-07-10 MED ORDER — ZOLPIDEM TARTRATE 5 MG PO TABS
5.0000 mg | ORAL_TABLET | Freq: Every evening | ORAL | Status: DC | PRN
  Filled 2018-07-10 (×2): qty 1

## 2018-07-10 MED ORDER — VANCOMYCIN HCL 1 G IV SOLR
INTRAVENOUS | Status: DC | PRN
  Administered 2018-07-10: 1000 mg via TOPICAL

## 2018-07-10 MED ORDER — DOCUSATE SODIUM 100 MG PO CAPS
100.0000 mg | ORAL_CAPSULE | Freq: Two times a day (BID) | ORAL | Status: DC
  Administered 2018-07-10 – 2018-07-16 (×12): 100 mg via ORAL
  Filled 2018-07-10 (×12): qty 1

## 2018-07-10 MED ORDER — ONDANSETRON HCL 4 MG/2ML IJ SOLN
INTRAMUSCULAR | Status: AC
  Filled 2018-07-10: qty 2

## 2018-07-10 SURGICAL SUPPLY — 77 items
BAG DECANTER FOR FLEXI CONT (MISCELLANEOUS) ×2 IMPLANT
BASKET BONE COLLECTION (BASKET) ×2 IMPLANT
BENZOIN TINCTURE PRP APPL 2/3 (GAUZE/BANDAGES/DRESSINGS) ×2 IMPLANT
BLADE CLIPPER SURG (BLADE) IMPLANT
BUR MATCHSTICK NEURO 3.0 LAGG (BURR) ×2 IMPLANT
BUR PRECISION FLUTE 6.0 (BURR) ×2 IMPLANT
CANISTER SUCT 3000ML PPV (MISCELLANEOUS) ×2 IMPLANT
CAP REVERE LOCKING (Cap) ×12 IMPLANT
CARTRIDGE OIL MAESTRO DRILL (MISCELLANEOUS) ×1 IMPLANT
CONT SPEC 4OZ CLIKSEAL STRL BL (MISCELLANEOUS) ×2 IMPLANT
COVER BACK TABLE 60X90IN (DRAPES) ×2 IMPLANT
COVER WAND RF STERILE (DRAPES) IMPLANT
DECANTER SPIKE VIAL GLASS SM (MISCELLANEOUS) ×2 IMPLANT
DIFFUSER DRILL AIR PNEUMATIC (MISCELLANEOUS) ×2 IMPLANT
DRAPE C-ARM 42X72 X-RAY (DRAPES) ×4 IMPLANT
DRAPE HALF SHEET 40X57 (DRAPES) ×2 IMPLANT
DRAPE LAPAROTOMY 100X72X124 (DRAPES) ×2 IMPLANT
DRAPE SURG 17X23 STRL (DRAPES) ×8 IMPLANT
DRSG OPSITE POSTOP 4X8 (GAUZE/BANDAGES/DRESSINGS) ×2 IMPLANT
DURASEAL APPLICATOR TIP (TIP) ×2 IMPLANT
DURASEAL SPINE SEALANT 3ML (MISCELLANEOUS) ×2 IMPLANT
ELECT BLADE 4.0 EZ CLEAN MEGAD (MISCELLANEOUS) ×2
ELECT REM PT RETURN 9FT ADLT (ELECTROSURGICAL) ×2
ELECTRODE BLDE 4.0 EZ CLN MEGD (MISCELLANEOUS) ×1 IMPLANT
ELECTRODE REM PT RTRN 9FT ADLT (ELECTROSURGICAL) ×1 IMPLANT
EVACUATOR 1/8 PVC DRAIN (DRAIN) IMPLANT
GAUZE 4X4 16PLY RFD (DISPOSABLE) ×2 IMPLANT
GAUZE SPONGE 4X4 12PLY STRL (GAUZE/BANDAGES/DRESSINGS) ×2 IMPLANT
GLOVE BIO SURGEON STRL SZ 6.5 (GLOVE) ×6 IMPLANT
GLOVE BIO SURGEON STRL SZ8 (GLOVE) ×4 IMPLANT
GLOVE BIO SURGEON STRL SZ8.5 (GLOVE) ×4 IMPLANT
GLOVE BIOGEL PI IND STRL 6.5 (GLOVE) ×2 IMPLANT
GLOVE BIOGEL PI IND STRL 7.0 (GLOVE) ×5 IMPLANT
GLOVE BIOGEL PI IND STRL 7.5 (GLOVE) ×3 IMPLANT
GLOVE BIOGEL PI IND STRL 8 (GLOVE) ×1 IMPLANT
GLOVE BIOGEL PI INDICATOR 6.5 (GLOVE) ×2
GLOVE BIOGEL PI INDICATOR 7.0 (GLOVE) ×5
GLOVE BIOGEL PI INDICATOR 7.5 (GLOVE) ×3
GLOVE BIOGEL PI INDICATOR 8 (GLOVE) ×1
GLOVE ECLIPSE 7.5 STRL STRAW (GLOVE) ×2 IMPLANT
GLOVE EXAM NITRILE XL STR (GLOVE) IMPLANT
GOWN STRL REUS W/ TWL LRG LVL3 (GOWN DISPOSABLE) ×1 IMPLANT
GOWN STRL REUS W/ TWL XL LVL3 (GOWN DISPOSABLE) ×5 IMPLANT
GOWN STRL REUS W/TWL 2XL LVL3 (GOWN DISPOSABLE) IMPLANT
GOWN STRL REUS W/TWL LRG LVL3 (GOWN DISPOSABLE) ×1
GOWN STRL REUS W/TWL XL LVL3 (GOWN DISPOSABLE) ×5
HEMOSTAT POWDER KIT SURGIFOAM (HEMOSTASIS) ×2 IMPLANT
KIT BASIN OR (CUSTOM PROCEDURE TRAY) ×2 IMPLANT
KIT INFUSE X SMALL 1.4CC (Orthopedic Implant) ×2 IMPLANT
KIT TURNOVER KIT B (KITS) ×2 IMPLANT
MILL MEDIUM DISP (BLADE) IMPLANT
NEEDLE HYPO 21X1.5 SAFETY (NEEDLE) ×2 IMPLANT
NEEDLE HYPO 22GX1.5 SAFETY (NEEDLE) ×2 IMPLANT
NS IRRIG 1000ML POUR BTL (IV SOLUTION) ×2 IMPLANT
OIL CARTRIDGE MAESTRO DRILL (MISCELLANEOUS) ×2
PACK LAMINECTOMY NEURO (CUSTOM PROCEDURE TRAY) ×2 IMPLANT
PAD ARMBOARD 7.5X6 YLW CONV (MISCELLANEOUS) ×6 IMPLANT
PATTIES SURGICAL .5 X1 (DISPOSABLE) IMPLANT
PUTTY DBM 10CC CALC GRAN (Putty) ×2 IMPLANT
ROD REVERE CURVED 65MM (Rod) ×4 IMPLANT
SCREW 7.5X50MM (Screw) ×4 IMPLANT
SLEEVE SURGEON STRL (DRAPES) ×2 IMPLANT
SPACER ALTERA 10X26 9-13MM (Spacer) ×2 IMPLANT
SPONGE LAP 4X18 RFD (DISPOSABLE) IMPLANT
SPONGE NEURO XRAY DETECT 1X3 (DISPOSABLE) IMPLANT
SPONGE SURGIFOAM ABS GEL 100 (HEMOSTASIS) ×2 IMPLANT
STRIP BIOACTIVE 10CC 25X50X8 (Miscellaneous) ×2 IMPLANT
STRIP CLOSURE SKIN 1/2X4 (GAUZE/BANDAGES/DRESSINGS) ×2 IMPLANT
SUT PROLENE 6 0 BV (SUTURE) ×6 IMPLANT
SUT VIC AB 1 CT1 18XBRD ANBCTR (SUTURE) ×2 IMPLANT
SUT VIC AB 1 CT1 8-18 (SUTURE) ×2
SUT VIC AB 2-0 CP2 18 (SUTURE) ×4 IMPLANT
SYR 20CC LL (SYRINGE) ×2 IMPLANT
TOWEL GREEN STERILE (TOWEL DISPOSABLE) ×2 IMPLANT
TOWEL GREEN STERILE FF (TOWEL DISPOSABLE) ×2 IMPLANT
TRAY FOLEY MTR SLVR 16FR STAT (SET/KITS/TRAYS/PACK) ×2 IMPLANT
WATER STERILE IRR 1000ML POUR (IV SOLUTION) ×2 IMPLANT

## 2018-07-10 NOTE — Op Note (Signed)
Brief history: The patient is a 72 year old white female on whom I previously performed an L4-5 decompression, instrumentation and fusion.  She initially did well but has developed recurrent back and leg pain consistent with neurogenic claudication.  She failed medical management and was worked up with a lumbar myelo CT.  This demonstrated adjacent segment stenosis at L3-4 and a central herniated disc.  I discussed the various treatment options with the patient and her husband.  She has weighed the risks, benefits and alternatives of surgery and decided to proceed with an exploration of her lumbar fusion with an extension of her decompression and fusion to L3-4.  Preoperative diagnosis: L3-4 degenerative disc disease, spinal stenosis compressing both the L3 and the L4 nerve roots; lumbago; lumbar radiculopathy; neurogenic claudication  Postoperative diagnosis: The same and lumbar pseudoarthrosis  Procedure: Bilateral L3-4 laminotomy/foraminotomies/medial facetectomy to decompress the bilateral L3 and L4 nerve roots(the work required to do this was in addition to the work required to do the posterior lumbar interbody fusion because of the patient's spinal stenosis, facet arthropathy. Etc. requiring a wide decompression of the nerve roots.);  L3-4 transforaminal lumbar interbody fusion with local morselized autograft bone, Zimmer DBM and Kinnex graft extender; insertion of interbody prosthesis at L3-4 (globus peek expandable interbody prosthesis); posterior segmental instrumentation from L3 to L5 with globus titanium pedicle screws and rods; exploration of lumbar fusion/removal of lumbar hardware; posterior lateral arthrodesis at L3-4 and L4-5 with local morselized autograft bone, Zimmer DBM and Kinnex bone graft extender.  Surgeon: Dr. Earle Gell  Asst.: Dr. Jovita Gamma and Arnetha Massy nurse practitioner  Anesthesia: Gen. endotracheal  Estimated blood loss: 200 cc  Drains:  None  Complications: Durotomy  Description of procedure: The patient was brought to the operating room by the anesthesia team. General endotracheal anesthesia was induced. The patient was turned to the prone position on the Wilson frame. The patient's lumbosacral region was then prepared with Betadine scrub and Betadine solution. Sterile drapes were applied.  I then injected the area to be incised with Marcaine with epinephrine solution. I then used the scalpel to make a linear midline incision over the L3-4 and L4-5 interspace, incising through the old surgical scar. I then used electrocautery to perform a bilateral subperiosteal dissection exposing the spinous process and lamina of L3, L4 and L5 and exposing the old hardware at L4-5 bilaterally. We then inserted the Verstrac retractor to provide exposure.  We explored the fusion by removing the caps from the old rods, then removing the rods.  The screws were not loose but I did not see any evidence of a posterior lateral arthrodesis indicating there was likely a pseudoarthrosis at L4-5.  I began the decompression by using the high speed drill to perform laminotomies at L3-4 bilaterally. We then used the Kerrison punches to widen the laminotomy and removed the ligamentum flavum at L3-4 bilaterally. We used the Kerrison punches to remove the medial facets at L3-4 bilaterally. We performed wide foraminotomies about the bilateral L3 and L4 nerve roots completing the decompression.  We now turned our attention to the posterior lumbar interbody fusion. I used a scalpel to incise the intervertebral disc at L3-4 bilaterally. I then performed a partial intervertebral discectomy at L3-4 bilaterally using the pituitary forceps.  In route removing the central herniated disc at L3-4 we did create a small durotomy on the ventral lateral surface at L3-4 on the left.  I reapproximated the durotomy with interrupted 6-0 Prolene suture.  There was some  spinal fluid  leakage along the suture line.  I later covered the suture line with fibrin glue.  We prepared the vertebral endplates at Q9-4 bilaterally for the fusion by removing the soft tissues with the curettes. We then used the trial spacers to pick the appropriate sized interbody prosthesis. We prefilled his prosthesis with a combination of local morselized autograft bone that we obtained during the decompression as well as bone morphogenic protein soaked collagen sponges and Kinnex bone graft extender. We inserted the prefilled prosthesis into the interspace at L3-4, we then turned and expanded the prosthesis. There was a good snug fit of the prosthesis in the interspace. We then filled and the remainder of the intervertebral disc space with local morselized autograft bone and Kinnex. This completed the posterior lumbar interbody arthrodesis.  We now turned attention to the instrumentation. Under fluoroscopic guidance we cannulated the bilateral L3 pedicles with the bone probe. We then removed the bone probe. We then tapped the pedicle with a 6.5 millimeter tap. We then removed the tap. We probed inside the tapped pedicle with a ball probe to rule out cortical breaches. We then inserted a 6.5 x 50 millimeter pedicle screw into the L3 pedicles bilaterally under fluoroscopic guidance. We then palpated along the medial aspect of the pedicles to rule out cortical breaches. There were none. The nerve roots were not injured. We then connected the unilateral pedicle screws with a lordotic rod, from L3-L5 bilaterally. We compressed the construct and secured the rod in place with the caps. We then tightened the caps appropriately. This completed the instrumentation from L3-L5 bilaterally.  We now turned our attention to the posterior lateral arthrodesis at L3-4 and the redo arthrodesis at L4-5. We used the high-speed drill to decorticate the remainder of the facets, pars, transverse process at L3-4 and L4-5. We then applied a  combination of local morselized autograft bone, bone morphogenic protein soaked collagen sponges, Zimmer DBM and Kinnex bone graft extender over these decorticated posterior lateral structures. This completed the posterior lateral arthrodesis.  We then obtained hemostasis using bipolar electrocautery. We irrigated the wound out with bacitracin solution. We inspected the thecal sac and nerve roots and noted they were well decompressed. We then removed the retractor. We placed vancomycin powder in the wound. We reapproximated patient's thoracolumbar fascia with interrupted #1 Vicryl suture. We reapproximated patient's subcutaneous tissue with interrupted 2-0 Vicryl suture. The reapproximated patient's skin with Steri-Strips and benzoin. The wound was then coated with bacitracin ointment. A sterile dressing was applied. The drapes were removed. The patient was subsequently returned to the supine position where they were extubated by the anesthesia team. He was then transported to the post anesthesia care unit in stable condition. All sponge instrument and needle counts were reportedly correct at the end of this case.

## 2018-07-10 NOTE — Anesthesia Preprocedure Evaluation (Signed)
Anesthesia Evaluation  Patient identified by MRN, date of birth, ID band Patient awake    Reviewed: Allergy & Precautions, NPO status , Patient's Chart, lab work & pertinent test results  Airway Mallampati: II  TM Distance: >3 FB Neck ROM: Full    Dental   Pulmonary former smoker,    Pulmonary exam normal        Cardiovascular Normal cardiovascular exam     Neuro/Psych    GI/Hepatic GERD  Medicated and Controlled,  Endo/Other  Hypothyroidism   Renal/GU      Musculoskeletal   Abdominal   Peds  Hematology   Anesthesia Other Findings   Reproductive/Obstetrics                             Anesthesia Physical Anesthesia Plan  ASA: III  Anesthesia Plan: General   Post-op Pain Management:    Induction:   PONV Risk Score and Plan: 3 and Ondansetron, Midazolam and Dexamethasone  Airway Management Planned: Oral ETT  Additional Equipment:   Intra-op Plan:   Post-operative Plan: Extubation in OR  Informed Consent: I have reviewed the patients History and Physical, chart, labs and discussed the procedure including the risks, benefits and alternatives for the proposed anesthesia with the patient or authorized representative who has indicated his/her understanding and acceptance.     Plan Discussed with: CRNA and Surgeon  Anesthesia Plan Comments:         Anesthesia Quick Evaluation

## 2018-07-10 NOTE — Transfer of Care (Signed)
Immediate Anesthesia Transfer of Care Note  Patient: Janet Rice  Procedure(s) Performed: LUMBAR THREE- LUMBAR FOUR POSTERIOR LUMBAR INTERBODY FUSION, INTERBODY PROSTHESIS, POSTERIOR INSTRUMENTATION, EXPLORE OLD FUSION LUMBAR FOUR- LUMBAR FIVE (N/A Back)  Patient Location: PACU  Anesthesia Type:General  Level of Consciousness: awake, alert , oriented and patient cooperative  Airway & Oxygen Therapy: Patient Spontanous Breathing and Patient connected to face mask oxygen  Post-op Assessment: Report given to RN and Post -op Vital signs reviewed and stable  Post vital signs: Reviewed and stable  Last Vitals:  Vitals Value Taken Time  BP 155/73 07/10/2018  6:05 PM  Temp 36.4 C 07/10/2018  6:03 PM  Pulse 71 07/10/2018  6:07 PM  Resp 15 07/10/2018  6:07 PM  SpO2 98 % 07/10/2018  6:07 PM  Vitals shown include unvalidated device data.  Last Pain:  Vitals:   07/10/18 1803  TempSrc:   PainSc: 0-No pain      Patients Stated Pain Goal: 3 (93/90/30 0923)  Complications: No apparent anesthesia complications

## 2018-07-10 NOTE — H&P (Signed)
Subjective: The patient is a 72 year old white female on whom I performed a L4-5 decompression, instrumentation and fusion.  She initially did well but has developed recurrent back and leg pain.  She was worked up with a lumbar myelo CT which demonstrated severe spinal stenosis at L3-4.  I discussed the various treatment options with the patient including surgery.  She has decided to proceed with surgery.  Past Medical History:  Diagnosis Date  . Adenomatous colon polyp   . Arthritis   . Childhood asthma    AS A CHILD  . Chronic heartburn    On omeprazole  . Complication of anesthesia    HARD TO WAKE UP  . Dyspnea    low hgb. and iron  chronic problem  . External hemorrhoids   . Frequency of urination   . Frequent loose stools   . GERD (gastroesophageal reflux disease)   . Heme positive stool   . History of hiatal hernia   . Hyperlipidemia   . Hypothyroidism   . Iron deficiency anemia   . Melanoma ([place])    left thigh s/p LN removal left groin  . Metastatic melanoma ([place])    Followed by Dr Oliva Bustard, previous chemo, no recurrence, 2008?  . Multinodular goiter   . SCC (squamous cell carcinoma)    skin    Past Surgical History:  Procedure Laterality Date  . ABDOMINAL HYSTERECTOMY  1984  . BACK SURGERY  03/2016   LUMBAR  . CARPAL TUNNEL RELEASE Right 09/07/2016   Procedure: CARPAL TUNNEL RELEASE;  Surgeon: Thornton Park, MD;  Location: [facility] ORS;  Service: Orthopedics;  Laterality: Right;  . CHOLECYSTECTOMY  1990  . COLONOSCOPY  08/2006   Four sessile polyps found and removed in sigmoid colon at splenic flexure and ascending colon. 7 mm in size. Another removed from transverse colon 4 mm. Path Report - showed tubular adenoma and hyperplastic polyp. Advised to repeat in 3.5 years (02/2010)  . COLONOSCOPY  3.2.2011   8 mm polyp in sigmoid colon, removed, 4 mm polyp in descending colon, removed. Internal hemorrhoids  . ESOPHAGOGASTRODUODENOSCOPY  3.2.2011   Large hiatia hernia,  esophagus normal, mulitple small sessile polyps w/no stigmata of recent bleeding found. Mildy erythermatous mucosa w/no bleeding found in gatric antrum. Normal duodenum. 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 PATH - no active inflammation, antral mucosa w/mild foveolar hyperplasia  . FRACTURE SURGERY    . Lymph Node Removal  12/2005   Left inguinal lymph node dissection  . MELANOMA EXCISION Left 1993   Left thigh  . ORIF ANKLE FRACTURE Right    Dr. Mack Guise  . TOTAL HIP ARTHROPLASTY Right 11/18/2016   Procedure: TOTAL HIP ARTHROPLASTY;  Surgeon: Thornton Park, MD;  Location: [facility] ORS;  Service: Orthopedics;  Laterality: Right;    Allergies  Allergen Reactions  . Sulfa Antibiotics Rash    Social History   Tobacco Use  . Smoking status: Former Smoker    Packs/day: 0.50    Years: 8.00    Pack years: 4.00    Types: Cigarettes    Last attempt to quit: 07/12/1978    Years since quitting: 40.0  . Smokeless tobacco: Never Used  Substance Use Topics  . Alcohol use: No    Alcohol/week: 0.0 standard drinks    Family History  Problem Relation Age of Onset  . Aneurysm Mother   . Heart disease Mother   . Colon polyps Father   . Diabetes Father   . Dementia Father   .  Stroke Father        TIA  . Leukemia Maternal Grandfather   . Skin cancer Paternal Grandfather   . Diabetes Other   . Colon polyps Other   . Colon cancer Neg Hx   . Breast cancer Neg Hx    Prior to Admission medications   Medication Sig Start Date End Date Taking? Authorizing Provider  acetaminophen (TYLENOL 8 HOUR ARTHRITIS PAIN) 650 MG CR tablet Take 1,300 mg by mouth daily.    Yes [provider]  Calcium Carb-Cholecalciferol (CALCIUM 600+D) 600-800 MG-UNIT TABS Take 1 tablet by mouth daily with lunch.   Yes [provider]  celecoxib (CELEBREX) 200 MG capsule Take 200 mg by mouth daily.  08/20/16  Yes [provider]  citalopram (CELEXA) 20 MG tablet TAKE 1 TABLET DAILY  09/07/17  Yes McLean-Scocuzza, Nino Glow, MD  Ferrous Sulfate Dried (SLOW RELEASE IRON) 45 MG TBCR Take 45 mg by mouth daily with lunch.   Yes [provider]  levothyroxine (SYNTHROID, LEVOTHROID) 125 MCG tablet TAKE 1 TABLET DAILY BEFORE BREAKFAST Patient taking differently: Take 125 mcg by mouth daily before breakfast.  01/05/18  Yes McLean-Scocuzza, Nino Glow, MD  loperamide (IMODIUM) 2 MG capsule Take 2 mg by mouth every morning.   Yes [provider]  Multiple Vitamins-Minerals (EYE VITAMINS & MINERALS) TABS Take 1 tablet by mouth daily.    Yes [provider]  NON FORMULARY Apply 1 application topically 2 (two) times daily as needed (muscle pain). Horse Liniment   Yes [provider]  omeprazole (PRILOSEC) 20 MG capsule Take 1 capsule (20 mg total) by mouth daily. 04/25/18  Yes McLean-Scocuzza, Nino Glow, MD  pravastatin (PRAVACHOL) 40 MG tablet Take 0.5 tablets (20 mg total) by mouth daily. 02/02/18  Yes McLean-Scocuzza, Nino Glow, MD  Propylene Glycol (SYSTANE BALANCE OP) Place 1 drop into both eyes daily as needed (dry eyes).   Yes [provider]  TURMERIC PO Take 1 capsule by mouth daily at 12 noon.   Yes [provider]     Review of Systems  Positive ROS: As above  All other systems have been reviewed and were otherwise negative with the exception of those mentioned in the HPI and as above.  Objective: Vital signs in last 24 hours: Temp:  [97.9 F (36.6 C)] 97.9 F (36.6 C) (12/30 1106) Pulse Rate:  [62] 62 (12/30 1106) Resp:  [18] 18 (12/30 1106) BP: (137)/(67) 137/67 (12/30 1106) SpO2:  [95 %] 95 % (12/30 1106) Weight:  [96.9 kg] 96.9 kg (12/30 1117) Estimated body mass index is 39.07 kg/m as calculated from the following:   Height as of this encounter: 5\' 2"  (1.575 m).   Weight as of this encounter: 96.9 kg.   General Appearance: Alert Head: Normocephalic, without obvious abnormality, atraumatic Eyes: PERRL,  conjunctiva/corneas clear, EOM's intact,    Ears: Normal  Throat: Normal  Neck: Supple, Back: Her lumbar incision is well-healed. Lungs: Clear to auscultation bilaterally, respirations unlabored Heart: Regular rate and rhythm, no murmur, rub or gallop Abdomen: Soft, non-tender Extremities: Extremities normal, atraumatic, no cyanosis or edema Skin: unremarkable  NEUROLOGIC:   Mental status: alert and oriented,Motor Exam - grossly normal Sensory Exam - grossly normal Reflexes:  Coordination - grossly normal Gait - grossly normal Balance - grossly normal Cranial Nerves: I: smell Not tested  II: visual acuity  OS: Normal  OD: Normal   II: visual fields Full to confrontation  II: pupils Equal, round, reactive  to light  III,VII: ptosis None  III,IV,VI: extraocular muscles  Full ROM  V: mastication Normal  V: facial light touch sensation  Normal  V,VII: corneal reflex  Present  VII: facial muscle function - upper  Normal  VII: facial muscle function - lower Normal  VIII: hearing Not tested  IX: soft palate elevation  Normal  IX,X: gag reflex Present  XI: trapezius strength  5/5  XI: sternocleidomastoid strength 5/5  XI: neck flexion strength  5/5  XII: tongue strength  Normal    Data Review Lab Results  Component Value Date   WBC 8.0 06/30/2018   HGB 11.0 (L) 06/30/2018   HCT 38.6 06/30/2018   MCV 91.0 06/30/2018   PLT 142 (L) 06/30/2018   Lab Results  Component Value Date   NA 140 06/30/2018   K 5.0 06/30/2018   CL 103 06/30/2018   CO2 26 06/30/2018   BUN 8 06/30/2018   CREATININE 0.94 06/30/2018   GLUCOSE 108 (H) 06/30/2018   Lab Results  Component Value Date   INR 0.93 11/03/2016    Assessment/Plan: L3-4 spinal stenosis, lumbago, lumbar radiculopathy, neurogenic claudication, adjacent segment disease: I have discussed the situation with the patient and her husband.  I reviewed her myelo CT with him and pointed out the abnormalities.  We have discussed the  various treatment options including surgery.  I have described the surgical treatment option of an exploration of her lumbar fusion with an L3-4 decompression, instrumentation and fusion.  I have shown her surgical models.  I have given her surgical pamphlet.  We have discussed the risks, benefits, alternatives, expected postoperative course, and likelihood of achieving our goals with surgery.  I have answered all her questions.  She has decided to proceed with surgery.   Ophelia Charter 07/10/2018 1:47 PM

## 2018-07-10 NOTE — Progress Notes (Signed)
Subjective: The patient is alert and pleasant.  She is in no apparent distress.  She looks well.  Objective: Vital signs in last 24 hours: Temp:  [97.5 F (36.4 C)-97.9 F (36.6 C)] 97.5 F (36.4 C) (12/30 1803) Pulse Rate:  [62-76] 76 (12/30 1805) Resp:  [13-18] 13 (12/30 1805) BP: (137-155)/(67-73) 155/73 (12/30 1805) SpO2:  [95 %-98 %] 98 % (12/30 1805) Weight:  [96.9 kg] 96.9 kg (12/30 1117) Estimated body mass index is 39.07 kg/m as calculated from the following:   Height as of this encounter: 5\' 2"  (1.575 m).   Weight as of this encounter: 96.9 kg.   Intake/Output from previous day: No intake/output data recorded. Intake/Output this shift: Total I/O In: 2000 [I.V.:2000] Out: 450 [Urine:250; Blood:200]  Physical exam the patient is alert and pleasant.  She is moving her lower extremities well.  Lab Results: No results for input(s): WBC, HGB, HCT, PLT in the last 72 hours. BMET No results for input(s): NA, K, CL, CO2, GLUCOSE, BUN, CREATININE, CALCIUM in the last 72 hours.  Studies/Results: Dg Lumbar Spine 2-3 Views  Result Date: 07/10/2018 CLINICAL DATA:  L3-4 fusion EXAM: DG C-ARM 61-120 MIN; LUMBAR SPINE - 2-3 VIEW COMPARISON:  CT 05/08/2018 FINDINGS: Remote changes of posterior fusion at L4-5 with interval placement of pedicle screws at L3. No hardware bony complicating feature. IMPRESSION: Posterior fusion changes as above.  No visible complicating feature. Electronically Signed   By: Rolm Baptise M.D.   On: 07/10/2018 17:28   Dg C-arm 1-60 Min  Result Date: 07/10/2018 CLINICAL DATA:  L3-4 fusion EXAM: DG C-ARM 61-120 MIN; LUMBAR SPINE - 2-3 VIEW COMPARISON:  CT 05/08/2018 FINDINGS: Remote changes of posterior fusion at L4-5 with interval placement of pedicle screws at L3. No hardware bony complicating feature. IMPRESSION: Posterior fusion changes as above.  No visible complicating feature. Electronically Signed   By: Rolm Baptise M.D.   On: 07/10/2018 17:28     Assessment/Plan: The patient is doing well.  I will keep her flat for a day or 2 because of her spinal fluid leak.  LOS: 0 days     Ophelia Charter 07/10/2018, 6:18 PM

## 2018-07-10 NOTE — Anesthesia Procedure Notes (Signed)
Procedure Name: Intubation Date/Time: 07/10/2018 2:06 PM Performed by: Cleda Daub, CRNA Pre-anesthesia Checklist: Patient identified, Emergency Drugs available, Suction available and Patient being monitored Patient Re-evaluated:Patient Re-evaluated prior to induction Oxygen Delivery Method: Circle system utilized Preoxygenation: Pre-oxygenation with 100% oxygen Induction Type: IV induction Ventilation: Mask ventilation without difficulty and Mask ventilation throughout procedure Laryngoscope Size: Mac and 3 Grade View: Grade I Tube type: Oral Tube size: 7.0 mm Number of attempts: 1 Airway Equipment and Method: Stylet Placement Confirmation: ETT inserted through vocal cords under direct vision,  positive ETCO2 and breath sounds checked- equal and bilateral Secured at: 21 cm Tube secured with: Tape Dental Injury: Teeth and Oropharynx as per pre-operative assessment

## 2018-07-11 LAB — BASIC METABOLIC PANEL
Anion gap: 9 (ref 5–15)
BUN: 9 mg/dL (ref 8–23)
CO2: 30 mmol/L (ref 22–32)
Calcium: 9.1 mg/dL (ref 8.9–10.3)
Chloride: 101 mmol/L (ref 98–111)
Creatinine, Ser: 0.91 mg/dL (ref 0.44–1.00)
GFR calc Af Amer: >60 mL/min (ref >60)
GFR calc non Af Amer: >60 mL/min (ref >60)
Glucose, Bld: 154 mg/dL — ABNORMAL HIGH (ref 70–99)
Potassium: 5.2 mmol/L — ABNORMAL HIGH (ref 3.5–5.1)
Sodium: 140 mmol/L (ref 135–145)

## 2018-07-11 LAB — CBC
HCT: 38.2 % (ref 36.0–46.0)
Hemoglobin: 11.3 g/dL — ABNORMAL LOW (ref 12.0–15.0)
MCH: 25.6 pg — ABNORMAL LOW (ref 26.0–34.0)
MCHC: 29.6 g/dL — ABNORMAL LOW (ref 30.0–36.0)
MCV: 86.4 fL (ref 80.0–100.0)
Platelets: 160 10*3/uL (ref 150–400)
RBC: 4.42 MIL/uL (ref 3.87–5.11)
RDW: 15.9 % — ABNORMAL HIGH (ref 11.5–15.5)
WBC: 10.5 10*3/uL (ref 4.0–10.5)
nRBC: 0 % (ref 0.0–0.2)

## 2018-07-11 NOTE — Progress Notes (Signed)
Orthopedic Tech Progress Note Patient Details:  Janet Rice 08/24/1945 [id number]  Patient ID: Janet Rice, female   DOB: May 31, 1946, 72 y.o.   MRN: [id number] I talked to RN. Pt told her that her husband was bringing her brace from home.  Karolee Stamps 07/11/2018, 9:08 AM

## 2018-07-11 NOTE — Anesthesia Postprocedure Evaluation (Signed)
Anesthesia Post Note  Patient: Janet Rice  Procedure(s) Performed: LUMBAR THREE- LUMBAR FOUR POSTERIOR LUMBAR INTERBODY FUSION, INTERBODY PROSTHESIS, POSTERIOR INSTRUMENTATION, EXPLORE OLD FUSION LUMBAR FOUR- LUMBAR FIVE (N/A Back)     Patient location during evaluation: PACU Anesthesia Type: General Level of consciousness: awake and alert Pain management: pain level controlled Vital Signs Assessment: post-procedure vital signs reviewed and stable Respiratory status: spontaneous breathing, nonlabored ventilation, respiratory function stable and patient connected to nasal cannula oxygen Cardiovascular status: blood pressure returned to baseline and stable Postop Assessment: no apparent nausea or vomiting Anesthetic complications: no    Last Vitals:  Vitals:   07/11/18 0400 07/11/18 0800  BP: 122/60 138/76  Pulse: 70 89  Resp: 18 18  Temp: 37.1 C 36.7 C  SpO2: 94% 95%    Last Pain:  Vitals:   07/11/18 0800  TempSrc: Oral  PainSc: 2                  Audry Pili

## 2018-07-11 NOTE — Progress Notes (Signed)
Subjective: The patient is alert and pleasant.  She looks well.  She is in no apparent distress.  She denies headaches.  Objective: Vital signs in last 24 hours: Temp:  [97.5 F (36.4 C)-98.7 F (37.1 C)] 98.7 F (37.1 C) (12/31 0400) Pulse Rate:  [62-76] 70 (12/31 0400) Resp:  [12-18] 18 (12/31 0400) BP: (107-155)/(60-75) 122/60 (12/31 0400) SpO2:  [92 %-98 %] 94 % (12/31 0400) Weight:  [96.9 kg] 96.9 kg (12/30 1117) Estimated body mass index is 39.07 kg/m as calculated from the following:   Height as of this encounter: 5\' 2"  (1.575 m).   Weight as of this encounter: 96.9 kg.   Intake/Output from previous day: 12/30 0701 - 12/31 0700 In: 2200 [I.V.:2000; IV Piggyback:200] Out: 1450 [Urine:1250; Blood:200] Intake/Output this shift: No intake/output data recorded.  Physical exam the patient is alert and pleasant.  Her strength is normal in her lower extremities.  Her dressing is clean and dry.  There is no drainage.  Lab Results: Recent Labs    07/11/18 0148  WBC 10.5  HGB 11.3*  HCT 38.2  PLT 160   BMET Recent Labs    07/11/18 0148  NA 140  K 5.2*  CL 101  CO2 30  GLUCOSE 154*  BUN 9  CREATININE 0.91  CALCIUM 9.1    Studies/Results: Dg Lumbar Spine 2-3 Views  Result Date: 07/10/2018 CLINICAL DATA:  L3-4 fusion EXAM: DG C-ARM 61-120 MIN; LUMBAR SPINE - 2-3 VIEW COMPARISON:  CT 05/08/2018 FINDINGS: Remote changes of posterior fusion at L4-5 with interval placement of pedicle screws at L3. No hardware bony complicating feature. IMPRESSION: Posterior fusion changes as above.  No visible complicating feature. Electronically Signed   By: Rolm Baptise M.D.   On: 07/10/2018 17:28   Dg C-arm 1-60 Min  Result Date: 07/10/2018 CLINICAL DATA:  L3-4 fusion EXAM: DG C-ARM 61-120 MIN; LUMBAR SPINE - 2-3 VIEW COMPARISON:  CT 05/08/2018 FINDINGS: Remote changes of posterior fusion at L4-5 with interval placement of pedicle screws at L3. No hardware bony complicating  feature. IMPRESSION: Posterior fusion changes as above.  No visible complicating feature. Electronically Signed   By: Rolm Baptise M.D.   On: 07/10/2018 17:28    Assessment/Plan: Postop day #1: The patient is doing well clinically.  I will keep her flat another day because of her spinal fluid leak.  LOS: 1 day     Ophelia Charter 07/11/2018, 7:44 AM

## 2018-07-12 NOTE — Progress Notes (Signed)
Subjective: The patient is alert and pleasant.  She is in no apparent distress.  She denies headaches.  Objective: Vital signs in last 24 hours: Temp:  [97.8 F (36.6 C)-99.3 F (37.4 C)] 98.1 F (36.7 C) (01/01 0809) Pulse Rate:  [85-95] 85 (01/01 0809) Resp:  [18] 18 (01/01 0349) BP: (124-158)/(67-84) 145/75 (01/01 0809) SpO2:  [92 %-96 %] 95 % (01/01 0809) Estimated body mass index is 39.07 kg/m as calculated from the following:   Height as of this encounter: 5\' 2"  (1.575 m).   Weight as of this encounter: 96.9 kg.   Intake/Output from previous day: 12/31 0701 - 01/01 0700 In: 240 [P.O.:240] Out: 1250 [Urine:1250] Intake/Output this shift: No intake/output data recorded.  Physical exam patient is alert and pleasant.  She is moving her lower extremities well.  Her dressing is clean and dry.  There is no drainage.  Lab Results: Recent Labs    07/11/18 0148  WBC 10.5  HGB 11.3*  HCT 38.2  PLT 160   BMET Recent Labs    07/11/18 0148  NA 140  K 5.2*  CL 101  CO2 30  GLUCOSE 154*  BUN 9  CREATININE 0.91  CALCIUM 9.1    Studies/Results: Dg Lumbar Spine 2-3 Views  Result Date: 07/10/2018 CLINICAL DATA:  L3-4 fusion EXAM: DG C-ARM 61-120 MIN; LUMBAR SPINE - 2-3 VIEW COMPARISON:  CT 05/08/2018 FINDINGS: Remote changes of posterior fusion at L4-5 with interval placement of pedicle screws at L3. No hardware bony complicating feature. IMPRESSION: Posterior fusion changes as above.  No visible complicating feature. Electronically Signed   By: Rolm Baptise M.D.   On: 07/10/2018 17:28   Dg C-arm 1-60 Min  Result Date: 07/10/2018 CLINICAL DATA:  L3-4 fusion EXAM: DG C-ARM 61-120 MIN; LUMBAR SPINE - 2-3 VIEW COMPARISON:  CT 05/08/2018 FINDINGS: Remote changes of posterior fusion at L4-5 with interval placement of pedicle screws at L3. No hardware bony complicating feature. IMPRESSION: Posterior fusion changes as above.  No visible complicating feature. Electronically  Signed   By: Rolm Baptise M.D.   On: 07/10/2018 17:28    Assessment/Plan: Postop day #2: I will raise the head of bed to 30 degrees.  We will likely mobilize her tomorrow.  LOS: 2 days     Ophelia Charter 07/12/2018, 10:17 AM

## 2018-07-12 NOTE — Progress Notes (Addendum)
Pt c/o of Bilateral lower leg cramping aprox. 1800, pain medicine given, during rounding Pt c/o intermittent Bilateral lower leg cramping which has been getting worse through out the day, on call MD paged via answering service, night RN updated.

## 2018-07-13 NOTE — Care Management Important Message (Signed)
Important Message  Patient Details  Name: Janet Rice MRN: [id number] Date of Birth: 1946-03-26   Medicare Important Message Given:  Yes    Orbie Pyo 07/13/2018, 3:01 PM

## 2018-07-13 NOTE — Evaluation (Signed)
Physical Therapy Evaluation Patient Details Name: Janet Rice MRN: [id number] DOB: 11/17/1945 Today's Date: 07/13/2018   History of Present Illness  73 yo admitted for L3-4 PLIF with CSF leak post op. PMHx; L4-5 fusion, arthritis, HLD, melanoma, RT THA  Clinical Impression  Pt supine on arrival and eager to begin mobilizing after bedrest. Pt with min assist for mobility with noted weakness from bedrest. Pt with desaturation to 85% on RA with ability to rise to 89% with slow deep breaths and cues, returned to 2L with SpO2 91%. Pt provided IS and instructed for use with pt only getting 700 mL and educated for benefit and need. Pt with decreased strength, function and activity tolerance who will benefit from acute therapy to maximize mobility and independence adhering to precautions. Pt without HA or additional symptoms during session. Pt with min assist to don corset. Handout provided for precautions and reviewed.     Follow Up Recommendations Supervision for mobility/OOB;No PT follow up    Equipment Recommendations  None recommended by PT    Recommendations for Other Services       Precautions / Restrictions Precautions Precautions: Back Precaution Booklet Issued: Yes (comment) Precaution Comments: watch sats Required Braces or Orthoses: Spinal Brace Spinal Brace: Lumbar corset;Applied in sitting position      Mobility  Bed Mobility Overal bed mobility: Needs Assistance Bed Mobility: Rolling;Sidelying to Sit Rolling: Supervision Sidelying to sit: Supervision       General bed mobility comments: cues for sequence with use of rail, HOB 20 degrees  Transfers Overall transfer level: Needs assistance   Transfers: Sit to/from Stand Sit to Stand: Min guard         General transfer comment: cues for hand placement, guarding for balance with pt recognizing initial weakness in standing from bedrest  Ambulation/Gait Ambulation/Gait assistance: Min assist Gait Distance  (Feet): 40 Feet Assistive device: Rolling walker (2 wheeled) Gait Pattern/deviations: Step-through pattern;Decreased stride length   Gait velocity interpretation: >2.62 ft/sec, indicative of community ambulatory General Gait Details: cues for posture, limited by fatigue  Stairs            Wheelchair Mobility    Modified Rankin (Stroke Patients Only)       Balance Overall balance assessment: Needs assistance   Sitting balance-Leahy Scale: Good       Standing balance-Leahy Scale: Fair                               Pertinent Vitals/Pain Pain Assessment: 0-10 Pain Score: 3  Pain Location: back Pain Descriptors / Indicators: Aching Pain Intervention(s): Limited activity within patient's tolerance;Repositioned    Home Living Family/patient expects to be discharged to:: Private residence Living Arrangements: Spouse/significant other Available Help at Discharge: Family;Available 24 hours/day Type of Home: House Home Access: Stairs to enter Entrance Stairs-Rails: None Entrance Stairs-Number of Steps: 2 Home Layout: One level Home Equipment: Walker - 2 wheels;Cane - single point;Shower seat - built in;Hand held shower head;Grab bars - tub/shower      Prior Function Level of Independence: Independent         Comments: working part time from home for [employer], cane when needed     [employer]        Extremity/Trunk Assessment   Upper Extremity Assessment Upper Extremity Assessment: Generalized weakness    Lower Extremity Assessment Lower Extremity Assessment: Generalized weakness    Cervical / Trunk Assessment Cervical / Trunk Assessment:  Other exceptions Cervical / Trunk Exceptions: post surgical  Communication   Communication: No difficulties  Cognition Arousal/Alertness: Awake/alert Behavior During Therapy: WFL for tasks assessed/performed Overall Cognitive Status: Within Functional Limits for tasks assessed                                         General Comments      Exercises     Assessment/Plan    PT Assessment Patient needs continued PT services  PT Problem List Decreased strength;Decreased balance;Decreased activity tolerance;Decreased knowledge of use of DME;Decreased knowledge of precautions;Cardiopulmonary status limiting activity;Decreased mobility       PT Treatment Interventions Gait training;Therapeutic activities;Stair training;Therapeutic exercise;DME instruction;Functional mobility training;Balance training;Patient/family education    PT Goals (Current goals can be found in the Care Plan section)  Acute Rehab PT Goals Patient Stated Goal: return home and to work PT Goal Formulation: With patient Time For Goal Achievement: 07/27/18 Potential to Achieve Goals: Good    Frequency Min 5X/week   Barriers to discharge        Co-evaluation               AM-PAC PT "6 Clicks" Mobility  Outcome Measure Help needed turning from your back to your side while in a flat bed without using bedrails?: A Little Help needed moving from lying on your back to sitting on the side of a flat bed without using bedrails?: A Little Help needed moving to and from a bed to a chair (including a wheelchair)?: A Little Help needed standing up from a chair using your arms (e.g., wheelchair or bedside chair)?: A Little Help needed to walk in hospital room?: A Little Help needed climbing 3-5 steps with a railing? : A Little 6 Click Score: 18    End of Session Equipment Utilized During Treatment: Back brace;Gait belt Activity Tolerance: Patient tolerated treatment well Patient left: in chair;with call bell/phone within reach Nurse Communication: Mobility status;Other (comment);Precautions(desaturation and incentive spirometer) PT Visit Diagnosis: Other abnormalities of gait and mobility (R26.89);Muscle weakness (generalized) (M62.81)    Time: 5400-8676 PT Time Calculation (min)  (ACUTE ONLY): 25 min   Charges:   PT Evaluation $PT Eval Moderate Complexity: 1 Mod PT Treatments $Therapeutic Activity: 8-22 mins        Sahasra Belue Pam Drown, PT Acute Rehabilitation Services Pager: [phone] Office: [place] 07/13/2018, 12:14 PM

## 2018-07-13 NOTE — Progress Notes (Signed)
Subjective: The patient is alert and pleasant.  She is diffusely sore.  She denies headaches.  Objective: Vital signs in last 24 hours: Temp:  [98 F (36.7 C)-99.1 F (37.3 C)] 98 F (36.7 C) (01/02 0600) Pulse Rate:  [85-88] 87 (01/01 1556) BP: (112-166)/(66-76) 166/76 (01/02 0600) SpO2:  [92 %-95 %] 92 % (01/02 0600) Estimated body mass index is 39.07 kg/m as calculated from the following:   Height as of this encounter: 5\' 2"  (1.575 m).   Weight as of this encounter: 96.9 kg.   Intake/Output from previous day: 01/01 0701 - 01/02 0700 In: 600 [P.O.:600] Out: 900 [Urine:900] Intake/Output this shift: No intake/output data recorded.  Physical exam the patient is alert and oriented.  She is moving her lower extremities well.  Lab Results: Recent Labs    07/11/18 0148  WBC 10.5  HGB 11.3*  HCT 38.2  PLT 160   BMET Recent Labs    07/11/18 0148  NA 140  K 5.2*  CL 101  CO2 30  GLUCOSE 154*  BUN 9  CREATININE 0.91  CALCIUM 9.1    Studies/Results: No results found.  Assessment/Plan: Postop day #3: We will mobilize her with PT.  LOS: 3 days     Ophelia Charter 07/13/2018, 7:53 AM

## 2018-07-14 NOTE — Progress Notes (Signed)
Subjective: The patient is alert and pleasant.  She has no complaints.  She denies headaches.  Objective: Vital signs in last 24 hours: Temp:  [98.3 F (36.8 C)-100 F (37.8 C)] 99.1 F (37.3 C) (01/03 0400) Pulse Rate:  [95-98] 98 (01/02 1521) Resp:  [17-19] 19 (01/02 1521) BP: (110-144)/(55-74) 142/74 (01/03 0400) SpO2:  [85 %-94 %] 94 % (01/03 0400) Estimated body mass index is 39.07 kg/m as calculated from the following:   Height as of this encounter: 5\' 2"  (1.575 m).   Weight as of this encounter: 96.9 kg.   Intake/Output from previous day: 01/02 0701 - 01/03 0700 In: 240 [P.O.:240] Out: 825 [Urine:825] Intake/Output this shift: No intake/output data recorded.  Physical exam patient is alert and oriented.  She is moving her lower extremities well.  She is ambulating.  Lab Results: No results for input(s): WBC, HGB, HCT, PLT in the last 72 hours. BMET No results for input(s): NA, K, CL, CO2, GLUCOSE, BUN, CREATININE, CALCIUM in the last 72 hours.  Studies/Results: No results found.  Assessment/Plan: Postop day #4: We will continue to mobilize the patient.  Perhaps she can go home tomorrow.  We will discontinue the Foley catheter.  Hypoxia: Likely secondary to immobility and should resolve as she mobilizes.  LOS: 4 days     Ophelia Charter 07/14/2018, 8:02 AM

## 2018-07-14 NOTE — Progress Notes (Signed)
Physical Therapy Treatment Patient Details Name: Janet Rice MRN: [id number] DOB: 16-Apr-1946 Today's Date: 07/14/2018    History of Present Illness 73 yo admitted for L3-4 PLIF with CSF leak post op. PMHx; L4-5 fusion, arthritis, HLD, melanoma, RT THA    PT Comments    Pt very pleasant supine on arrival with brace under her. Pt states it was not removed before return to bed and so she slept on brace. Pt and RN educated for need to always remove brace prior to return to bed. Pt with min assist to don brace EOB. Increased gait tolerance but remains O2 dependent. Pt educated for all precautions and mobility with pt instructed to ambulate with nursing and be OOB for all meals and toileting.   SpO2 87% on RA, 93% on 2L with gait    Follow Up Recommendations  Supervision for mobility/OOB;No PT follow up     Equipment Recommendations  None recommended by PT    Recommendations for Other Services       Precautions / Restrictions Precautions Precautions: Back Precaution Comments: watch sats Required Braces or Orthoses: Spinal Brace Spinal Brace: Lumbar corset;Applied in sitting position    Mobility  Bed Mobility Overal bed mobility: Modified Independent Bed Mobility: Rolling;Sidelying to Sit           General bed mobility comments: reliance on rail and increased time with bed flat  Transfers Overall transfer level: Needs assistance   Transfers: Sit to/from Stand Sit to Stand: Min guard         General transfer comment: cues for hand placement  Ambulation/Gait Ambulation/Gait assistance: Min guard Gait Distance (Feet): 120 Feet Assistive device: Rolling walker (2 wheeled) Gait Pattern/deviations: Step-through pattern;Decreased stride length   Gait velocity interpretation: >2.62 ft/sec, indicative of community ambulatory General Gait Details: cues for posture   Stairs             Wheelchair Mobility    Modified Rankin (Stroke Patients Only)        Balance Overall balance assessment: Needs assistance   Sitting balance-Leahy Scale: Good       Standing balance-Leahy Scale: Fair                              Cognition Arousal/Alertness: Awake/alert Behavior During Therapy: WFL for tasks assessed/performed Overall Cognitive Status: Within Functional Limits for tasks assessed                                        Exercises General Exercises - Lower Extremity Long Arc Quad: AROM;10 reps;Seated;Both Hip Flexion/Marching: AROM;10 reps;Seated;Both    General Comments        Pertinent Vitals/Pain Pain Assessment: No/denies pain    Home Living                      Prior Function            PT Goals (current goals can now be found in the care plan section) Progress towards PT goals: Progressing toward goals    Frequency           PT Plan Current plan remains appropriate    Co-evaluation              AM-PAC PT "6 Clicks" Mobility   Outcome Measure  Help needed turning from your back to your  side while in a flat bed without using bedrails?: A Little Help needed moving from lying on your back to sitting on the side of a flat bed without using bedrails?: A Little Help needed moving to and from a bed to a chair (including a wheelchair)?: A Little Help needed standing up from a chair using your arms (e.g., wheelchair or bedside chair)?: A Little Help needed to walk in hospital room?: A Little Help needed climbing 3-5 steps with a railing? : A Little 6 Click Score: 18    End of Session Equipment Utilized During Treatment: Back brace Activity Tolerance: Patient tolerated treatment well Patient left: in chair;with call bell/phone within reach Nurse Communication: Mobility status;Other (comment);Precautions(need to have brace off in bed, O2 dependent) PT Visit Diagnosis: Other abnormalities of gait and mobility (R26.89);Muscle weakness (generalized) (M62.81)      Time: 3748-2707 PT Time Calculation (min) (ACUTE ONLY): 19 min  Charges:  $Gait Training: 8-22 mins                     Canaan Prue Pam Drown, PT Acute Rehabilitation Services Pager: [phone] Office: [place] 07/14/2018, 8:26 AM

## 2018-07-14 NOTE — Consult Note (Signed)
            [facility] CM Primary Care Navigator  07/14/2018  Janet Rice 1946/06/17 [id number]   Seen patient at the bedsideto identify possible discharge needs.   Patient reportshaving recurrent"bad back pain, left hip and left knee pain that had led to this admission/ surgery. (severe spinal stenosis status post L4- L5 decompression, instrumentation and fusion).  Patient endorsesDr.Tracy McLean-Scocuzza with [facility] [employer] as herprimary care provider.   [facility] [place] [facility] without any problem.  Patientstatesthatshe has beenmanagingher ownmedications at home straight out from the containers using her "own system".  Patientverbalized that she has been driving prior to admission/ surgery but husband [name]) will be providing transportation toherdoctors' appointments after discharge.  Patient lives withhusbandwho will beherprimary caregiver at home as stated.  Anticipated discharge plan ishomeaccording to patient, with no PT follow-up per PT evaluation.  Patientvoiced understanding to call primary care provider's office for a post discharge follow-up appointment within1- 2 weeks orsooner if needs arise. Patient letter (with PCP's contact number) wasprovided asa reminder.  Discussed with patientregarding [facility]-CM services available for health management andresourcesat homebut she denied having any needs or concernsat this point. Patient had expressedunderstandingof needto seekreferral from primary care provider to [facility] care management ifdeemed necessary and appropriatefor anyservicesin the future.  [facility] care management information was provided for futureneeds thatshemay have.  Patient however, hadverbally agreedand optedforEMMIcalls tofollow-up withherrecovery at home.   Referral made for [facility] General calls after  discharge.    For additional questions please contact:  Edwena Felty A. Tiffancy Moger, BSN, RN-BC [facility] PRIMARY CARE Navigator Cell: [phone]

## 2018-07-15 ENCOUNTER — Inpatient Hospital Stay (HOSPITAL_COMMUNITY): Payer: Medicare Other

## 2018-07-15 DIAGNOSIS — R0902 Hypoxemia: Secondary | ICD-10-CM

## 2018-07-15 LAB — CBC
HCT: 35.6 % — ABNORMAL LOW (ref 36.0–46.0)
Hemoglobin: 10.7 g/dL — ABNORMAL LOW (ref 12.0–15.0)
MCH: 25.3 pg — ABNORMAL LOW (ref 26.0–34.0)
MCHC: 30.1 g/dL (ref 30.0–36.0)
MCV: 84.2 fL (ref 80.0–100.0)
Platelets: 178 10*3/uL (ref 150–400)
RBC: 4.23 MIL/uL (ref 3.87–5.11)
RDW: 15.8 % — ABNORMAL HIGH (ref 11.5–15.5)
WBC: 10 10*3/uL (ref 4.0–10.5)
nRBC: 0 % (ref 0.0–0.2)

## 2018-07-15 LAB — BASIC METABOLIC PANEL
Anion gap: 8 (ref 5–15)
BUN: 12 mg/dL (ref 8–23)
CO2: 33 mmol/L — ABNORMAL HIGH (ref 22–32)
Calcium: 8.8 mg/dL — ABNORMAL LOW (ref 8.9–10.3)
Chloride: 93 mmol/L — ABNORMAL LOW (ref 98–111)
Creatinine, Ser: 0.84 mg/dL (ref 0.44–1.00)
GFR calc Af Amer: >60 mL/min (ref >60)
GFR calc non Af Amer: >60 mL/min (ref >60)
Glucose, Bld: 116 mg/dL — ABNORMAL HIGH (ref 70–99)
Potassium: 4.5 mmol/L (ref 3.5–5.1)
Sodium: 134 mmol/L — ABNORMAL LOW (ref 135–145)

## 2018-07-15 MED ORDER — FUROSEMIDE 10 MG/ML IJ SOLN
20.0000 mg | Freq: Once | INTRAMUSCULAR | Status: AC
  Administered 2018-07-15: 20 mg via INTRAVENOUS
  Filled 2018-07-15: qty 2

## 2018-07-15 NOTE — Progress Notes (Signed)
Subjective: The patient is alert and pleasant.  She would like to go home.  She is in no apparent distress.  She denies headaches.  Objective: Vital signs in last 24 hours: Temp:  [98.2 F (36.8 C)-99 F (37.2 C)] 98.7 F (37.1 C) (01/04 0809) Pulse Rate:  [79-102] 94 (01/04 0811) Resp:  [16-18] 16 (01/04 0300) BP: (116-130)/(47-62) 123/62 (01/04 0809) SpO2:  [88 %-98 %] 91 % (01/04 0811) Estimated body mass index is 39.07 kg/m as calculated from the following:   Height as of this encounter: 5\' 2"  (1.575 m).   Weight as of this encounter: 96.9 kg.   Intake/Output from previous day: 01/03 0701 - 01/04 0700 In: -  Out: 200 [Urine:200] Intake/Output this shift: No intake/output data recorded.  Physical exam patient is alert and oriented.  Her wound is healing well without drainage.  Lower extremity strength is grossly normal.  Her room air oxygen saturation is 85%.  Lab Results: No results for input(s): WBC, HGB, HCT, PLT in the last 72 hours. BMET No results for input(s): NA, K, CL, CO2, GLUCOSE, BUN, CREATININE, CALCIUM in the last 72 hours.  Studies/Results: No results found.  Assessment/Plan: Postop day #5: The patient is slowly progressing.  She is ready for discharge home except as below.  Hypoxia: I will get a chest x-ray and ask pulmonary to see the patient.  LOS: 5 days     Ophelia Charter 07/15/2018, 8:43 AM

## 2018-07-15 NOTE — Consult Note (Signed)
NAME:  Janet Rice, MRN:  [id number], DOB:  [birth year], LOS: 5 ADMISSION DATE:  07/10/2018, CONSULTATION DATE:  07/15/18 REFERRING MD:  Newman Pies, MD CHIEF COMPLAINT:  Hypoxemia  History of present illness   Ms. Janet Rice is a88 year old female former smoker (4 pack-years, quit 40 years ago) with lumbar DDD and stenosis s/p L4-5 decompression who was admitted for L3-L4 laminotomy/foraminotomies/medial facetectomy for decompression on 07/10/18. No complications post-op. However she has remained hypoxemic on room air in no acute distress. Pulmonary consulted for hypoxemia.  She reports that she usually has hypoxemia after surgery and often required oxygen post-procedure that was required to wean down for a few days. She assumed this was a normal hospital course. She does have chronic dyspnea on exertion however she attributes this due to inactivity. Denies associated chest pain. At home, she is sedentary due to dyspnea and back pain. She reports laying in bed for two days after surgery and felt her dyspnea worsened with movement. Denies any symptoms at rest. Has used her incentive spirometry 5 times today and states she is able to reach 1000cc however only able to obtain 250cc at bedside with me.   Denies recent fevers, chills. Denies recent weight change. Reports occasional headache and fatigue. Does snore and brief episodes of witnessed apnea by husband but she does not believe this to be concerning. Denies nocturnal dypsnea. Does wake up 3-4 times nightly to urinate.  Past Medical History  Morbid obesity, lumbar DDD and stenosis, s/p L4-5 decompression, osteoporosis, hypothyroidism, hyperlipidemia, iron deficiency anemia   Significant Hospital Events   07/10/18 Admitted for lumbar laminotomy/foraminotomies/medial facetectomy  Consults:  Pulm 07/15/18  Procedures:  07/10/18  lumbar laminotomy/foraminotomies/medial facetectomy  Significant Diagnostic Tests:  CXR 07/15/2018  Reviewed independently myself - Low lung volumes with no effusion, edema or infiltrate. Enlarged cardiac silhouette   Micro Data:   Antimicrobials:   Interim history/subjective:  As above in HPI  Objective   Blood pressure (!) 143/65, pulse 94, temperature 98.5 F (36.9 C), temperature source Oral, resp. rate 16, height 5\' 2"  (1.575 m), weight 96.9 kg, SpO2 96 %.        Intake/Output Summary (Last 24 hours) at 07/15/2018 1446 Last data filed at 07/15/2018 3817 Gross per 24 hour  Intake 240 ml  Output -  Net 240 ml   Filed Weights   07/10/18 1117  Weight: 96.9 kg    Examination: General: well-developed female laying in bed, no acute distress HENT: [place], AT, MMM Lungs: Bibasilar crackles, no wheezing Cardiovascular: RRR, no m/g/r Abdomen: Soft, nontender, BS+ Extremities: No edema, no tenderness Neuro: AAOx4, CNII-XII  CXR 07/15/18 reviewed as noted above Prior TTE 07/14/08 with grade 1 diastolic dysfunction. EF 60-65% with no WMA.  No PFTs on file.  Labs reviewed independently by me Hg 10.7 BUN/Cr 12/0.84 CO2 33   Assessment & Plan:   Hypoxemia Pulmonary consulted for O2 saturations 85% on RA. Lung exam is concerning for mild volume overload and post-op atelectasis. She may also have undiagnosed and untreated sleep apnea that may be contributing to her mild hypoxemia. She has a hx of anemia but doubt this is contributing as her Hg is near/above baseline.   Recommend: --IV lasix 20 mg once --Incentive spirometry --Wean supplemental O2 with goal SpO2 >88%  --Outpatient sleep study --Prior to discharge, will need ambulatory O2 to determine need for home oxygen.  Patient requests to follow-up with me in Pulmonary clinic. Will arrange hospital follow-up  Rodman Pickle, M.D. [facility] Pulmonary/Critical Care Medicine Pager: [phone] After hours pager: [phone]   Labs   CBC: Recent Labs  Lab 07/11/18 0148  WBC 10.5  HGB 11.3*  HCT 38.2  MCV 86.4  PLT 160      Basic Metabolic Panel: Recent Labs  Lab 07/11/18 0148  NA 140  K 5.2*  CL 101  CO2 30  GLUCOSE 154*  BUN 9  CREATININE 0.91  CALCIUM 9.1   GFR: Estimated Creatinine Clearance: 60.7 mL/min (by C-G formula based on SCr of 0.91 mg/dL). Recent Labs  Lab 07/11/18 0148  WBC 10.5    Liver Function Tests: No results for input(s): AST, ALT, ALKPHOS, BILITOT, PROT, ALBUMIN in the last 168 hours. No results for input(s): LIPASE, AMYLASE in the last 168 hours. No results for input(s): AMMONIA in the last 168 hours.  ABG No results found for: PHART, PCO2ART, PO2ART, HCO3, TCO2, ACIDBASEDEF, O2SAT   Coagulation Profile: No results for input(s): INR, PROTIME in the last 168 hours.  Cardiac Enzymes: No results for input(s): CKTOTAL, CKMB, CKMBINDEX, TROPONINI in the last 168 hours.  HbA1C: Hgb A1c MFr Bld  Date/Time Value Ref Range Status  11/03/2016 09:56 AM 5.6 4.8 - 5.6 % Final    Comment:    (NOTE)         Pre-diabetes: 5.7 - 6.4         Diabetes: >6.4         Glycemic control for adults with diabetes: <7.0   09/01/2016 10:04 AM 5.3 4.8 - 5.6 % Final    Comment:             Pre-diabetes: 5.7 - 6.4          Diabetes: >6.4          Glycemic control for adults with diabetes: <7.0     CBG: No results for input(s): GLUCAP in the last 168 hours.  Review of Systems:   Review of Systems  Constitutional: Positive for malaise/fatigue. Negative for chills, diaphoresis, fever and weight loss.  HENT: Negative for congestion and sore throat.   Respiratory: Positive for shortness of breath. Negative for cough, hemoptysis, sputum production and wheezing.   Cardiovascular: Negative for chest pain, orthopnea, leg swelling and PND.  Gastrointestinal: Negative for abdominal pain, heartburn and nausea.  Genitourinary: Positive for frequency.  Musculoskeletal: Positive for back pain. Negative for myalgias.  Skin: Negative for rash.  Neurological: Positive for headaches. Negative  for dizziness and weakness.  Endo/Heme/Allergies: Does not bruise/bleed easily.    Past Medical History  She,  has a past medical history of Adenomatous colon polyp, Arthritis, Childhood asthma, Chronic heartburn, Complication of anesthesia, Dyspnea, External hemorrhoids, Frequency of urination, Frequent loose stools, GERD (gastroesophageal reflux disease), Heme positive stool, History of hiatal hernia, Hyperlipidemia, Hypothyroidism, Iron deficiency anemia, Melanoma ([place]), Metastatic melanoma ([place]), Multinodular goiter, and SCC (squamous cell carcinoma).   Surgical History    Past Surgical History:  Procedure Laterality Date  . ABDOMINAL HYSTERECTOMY  1984  . BACK SURGERY  03/2016   LUMBAR  . CARPAL TUNNEL RELEASE Right 09/07/2016   Procedure: CARPAL TUNNEL RELEASE;  Surgeon: Thornton Park, MD;  Location: [facility] ORS;  Service: Orthopedics;  Laterality: Right;  . CHOLECYSTECTOMY  1990  . COLONOSCOPY  08/2006   Four sessile polyps found and removed in sigmoid colon at splenic flexure and ascending colon. 7 mm in size. Another removed from transverse colon 4 mm. Path Report - showed tubular adenoma and hyperplastic polyp.  Advised to repeat in 3.5 years (02/2010)  . COLONOSCOPY  3.2.2011   8 mm polyp in sigmoid colon, removed, 4 mm polyp in descending colon, removed. Internal hemorrhoids  . ESOPHAGOGASTRODUODENOSCOPY  3.2.2011   Large hiatia hernia, esophagus normal, mulitple small sessile polyps w/no stigmata of recent bleeding found. Mildy erythermatous mucosa w/no bleeding found in gatric antrum. Normal duodenum. 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 PATH - no active inflammation, antral mucosa w/mild foveolar hyperplasia  . FRACTURE SURGERY    . Lymph Node Removal  12/2005   Left inguinal lymph node dissection  . MELANOMA EXCISION Left 1993   Left thigh  . ORIF ANKLE FRACTURE Right    Dr. Mack Guise  . TOTAL HIP ARTHROPLASTY Right 11/18/2016   Procedure: TOTAL HIP  ARTHROPLASTY;  Surgeon: Thornton Park, MD;  Location: [facility] ORS;  Service: Orthopedics;  Laterality: Right;     Social History   reports that she quit smoking about 40 years ago. Her smoking use included cigarettes. She has a 4.00 pack-year smoking history. She has never used smokeless tobacco. She reports that she does not drink alcohol or use drugs.   Family History   Her family history includes Aneurysm in her mother; Colon polyps in her father and another family member; Dementia in her father; Diabetes in her father and another family member; Heart disease in her mother; Leukemia in her maternal grandfather; Skin cancer in her paternal grandfather; Stroke in her father. There is no history of Colon cancer or Breast cancer.   Allergies Allergies  Allergen Reactions  . Sulfa Antibiotics Rash     Home Medications  Prior to Admission medications   Medication Sig Start Date End Date Taking? Authorizing Provider  acetaminophen (TYLENOL 8 HOUR ARTHRITIS PAIN) 650 MG CR tablet Take 1,300 mg by mouth daily.    Yes [provider]  Calcium Carb-Cholecalciferol (CALCIUM 600+D) 600-800 MG-UNIT TABS Take 1 tablet by mouth daily with lunch.   Yes [provider]  celecoxib (CELEBREX) 200 MG capsule Take 200 mg by mouth daily.  08/20/16  Yes [provider]  citalopram (CELEXA) 20 MG tablet TAKE 1 TABLET DAILY 09/07/17  Yes McLean-Scocuzza, Nino Glow, MD  Ferrous Sulfate Dried (SLOW RELEASE IRON) 45 MG TBCR Take 45 mg by mouth daily with lunch.   Yes [provider]  levothyroxine (SYNTHROID, LEVOTHROID) 125 MCG tablet TAKE 1 TABLET DAILY BEFORE BREAKFAST Patient taking differently: Take 125 mcg by mouth daily before breakfast.  01/05/18  Yes McLean-Scocuzza, Nino Glow, MD  loperamide (IMODIUM) 2 MG capsule Take 2 mg by mouth every morning.   Yes [provider]  Multiple Vitamins-Minerals (EYE VITAMINS & MINERALS) TABS Take 1 tablet by mouth daily.    Yes  [provider]  NON FORMULARY Apply 1 application topically 2 (two) times daily as needed (muscle pain). Horse Liniment   Yes [provider]  omeprazole (PRILOSEC) 20 MG capsule Take 1 capsule (20 mg total) by mouth daily. 04/25/18  Yes McLean-Scocuzza, Nino Glow, MD  pravastatin (PRAVACHOL) 40 MG tablet Take 0.5 tablets (20 mg total) by mouth daily. 02/02/18  Yes McLean-Scocuzza, Nino Glow, MD  Propylene Glycol (SYSTANE BALANCE OP) Place 1 drop into both eyes daily as needed (dry eyes).   Yes [provider]  TURMERIC PO Take 1 capsule by mouth daily at 12 noon.   Yes [provider]    Rodman Pickle, M.D. [facility] Pulmonary/Critical Care Medicine Pager: [phone] After hours pager: [phone]

## 2018-07-15 NOTE — Progress Notes (Signed)
SATURATION QUALIFICATIONS: (This note is used to comply with regulatory documentation for home oxygen)  Patient Saturations on Room Air at Rest = 85%  Patient Saturations on Room Air while Ambulating = 82%  Patient Saturations on 2 Liters of oxygen while Ambulating = 91%  Please briefly explain why patient needs home oxygen: Patient's oxygen level at rest is low, and drops even lower while ambulating. Pt able to maintain O2 on 2L via [place].

## 2018-07-15 NOTE — Progress Notes (Signed)
Physical Therapy Treatment Patient Details Name: Janet Rice MRN: [id number] DOB: 02/02/1946 Today's Date: 07/15/2018    History of Present Illness 73 yo admitted for L3-4 PLIF with CSF leak post op. PMHx; L4-5 fusion, arthritis, HLD, melanoma, RT THA    PT Comments    Pt making steady progress. Eager to return home with husband.   Follow Up Recommendations  Supervision for mobility/OOB;No PT follow up     Equipment Recommendations  None recommended by PT    Recommendations for Other Services       Precautions / Restrictions Precautions Precautions: Back Precaution Booklet Issued: Yes (comment) Precaution Comments: watch sats Required Braces or Orthoses: Spinal Brace Spinal Brace: Lumbar corset;Applied in sitting position Restrictions Weight Bearing Restrictions: No    Mobility  Bed Mobility               General bed mobility comments: Pt up in chair  Transfers Overall transfer level: Needs assistance   Transfers: Sit to/from Stand Sit to Stand: Supervision            Ambulation/Gait Ambulation/Gait assistance: Supervision Gait Distance (Feet): 110 Feet Assistive device: Rolling walker (2 wheeled) Gait Pattern/deviations: Step-through pattern;Decreased stride length Gait velocity: decr Gait velocity interpretation: 1.31 - 2.62 ft/sec, indicative of limited community ambulator General Gait Details: supervision for safety. Distance limited due to pt had been up ambulating multiple times today   Stairs Stairs: Yes Stairs assistance: Min assist Stair Management: Two rails;Step to pattern;Forwards Number of Stairs: 2 General stair comments: Used portable step with walker over it    Wheelchair Mobility    Modified Rankin (Stroke Patients Only)       Balance Overall balance assessment: Needs assistance Sitting-balance support: No upper extremity supported;Feet supported Sitting balance-Leahy Scale: Good     Standing balance support:  No upper extremity supported;During functional activity Standing balance-Leahy Scale: Fair                              Cognition Arousal/Alertness: Awake/alert Behavior During Therapy: WFL for tasks assessed/performed Overall Cognitive Status: Within Functional Limits for tasks assessed                                        Exercises      General Comments        Pertinent Vitals/Pain Pain Assessment: Faces Faces Pain Scale: Hurts even more Pain Location: rt leg Pain Descriptors / Indicators: Cramping Pain Intervention(s): Repositioned;Patient requesting pain meds-RN notified    Home Living                      Prior Function            PT Goals (current goals can now be found in the care plan section) Progress towards PT goals: Progressing toward goals    Frequency    Min 5X/week      PT Plan Current plan remains appropriate    Co-evaluation              AM-PAC PT "6 Clicks" Mobility   Outcome Measure  Help needed turning from your back to your side while in a flat bed without using bedrails?: A Little Help needed moving from lying on your back to sitting on the side of a flat bed without using bedrails?: A Little Help  needed moving to and from a bed to a chair (including a wheelchair)?: A Little Help needed standing up from a chair using your arms (e.g., wheelchair or bedside chair)?: A Little Help needed to walk in hospital room?: A Little Help needed climbing 3-5 steps with a railing? : A Little 6 Click Score: 18    End of Session Equipment Utilized During Treatment: Back brace Activity Tolerance: Patient tolerated treatment well Patient left: in chair;with call bell/phone within reach;with family/visitor present Nurse Communication: Mobility status PT Visit Diagnosis: Other abnormalities of gait and mobility (R26.89);Muscle weakness (generalized) (M62.81)     Time: 1030-1047 PT Time Calculation (min)  (ACUTE ONLY): 17 min  Charges:  $Gait Training: 8-22 mins                     [place]bbott Pager [phone] Office [place] 07/15/2018, 2:11 PM

## 2018-07-16 MED ORDER — OXYCODONE HCL 5 MG PO TABS: 5.0000 mg | ORAL_TABLET | ORAL | 0 refills | Status: DC | PRN

## 2018-07-16 MED ORDER — CYCLOBENZAPRINE HCL 10 MG PO TABS: 10.0000 mg | ORAL_TABLET | Freq: Three times a day (TID) | ORAL | 1 refills | Status: DC | PRN

## 2018-07-16 NOTE — Discharge Summary (Signed)
Physician Discharge Summary  Patient ID: Janet Rice MRN: [id number] DOB/AGE: March 29, 1946 73 y.o.  Admit date: 07/10/2018 Discharge date: 07/16/2018  Admission Diagnoses: L3-4 degenerative disc disease, spinal stenosis compressing both the L3 and the L4 nerve roots; lumbago; lumbar radiculopathy; neurogenic claudication  Discharge Diagnoses:  L3-4 degenerative disc disease, spinal stenosis compressing both the L3 and the L4 nerve roots; lumbago; lumbar radiculopathy; neurogenic claudication Active Problems:   Spinal stenosis of lumbar region with neurogenic claudication   Discharged Condition: good  Hospital Course: Patient was admitted for surgery and underwent Bilateral L3-4 laminotomy/foraminotomies/medial facetectomy to decompress the bilateral L3 and L4 nerve roots(the work required to do this was in addition to the work required to do the posterior lumbar interbody fusion because of the patient's spinal stenosis, facet arthropathy. Etc. requiring a wide decompression of the nerve roots.);  L3-4 transforaminal lumbar interbody fusion with local morselized autograft bone, Zimmer DBM and Kinnex graft extender; insertion of interbody prosthesis at L3-4 (globus peek expandable interbody prosthesis); posterior segmental instrumentation from L3 to L5 with globus titanium pedicle screws and rods; exploration of lumbar fusion/removal of lumbar hardware; posterior lateral arthrodesis at L3-4 and L4-5 with local morselized autograft bone, Zimmer DBM and Kinnex bone graft extender.  She did well after surgery apart from postoperative hypoxia, which required supplemental oxygen.  Consults: pulmonary/intensive care  Significant Diagnostic Studies: None  Treatments: surgery: Bilateral L3-4 laminotomy/foraminotomies/medial facetectomy to decompress the bilateral L3 and L4 nerve roots(the work required to do this was in addition to the work required to do the posterior lumbar interbody fusion because  of the patient's spinal stenosis, facet arthropathy. Etc. requiring a wide decompression of the nerve roots.);  L3-4 transforaminal lumbar interbody fusion with local morselized autograft bone, Zimmer DBM and Kinnex graft extender; insertion of interbody prosthesis at L3-4 (globus peek expandable interbody prosthesis); posterior segmental instrumentation from L3 to L5 with globus titanium pedicle screws and rods; exploration of lumbar fusion/removal of lumbar hardware; posterior lateral arthrodesis at L3-4 and L4-5 with local morselized autograft bone, Zimmer DBM and Kinnex bone graft extender.  Discharge Exam: Blood pressure 127/68, pulse 79, temperature 97.8 F (36.6 C), temperature source Oral, resp. rate 18, height 5\' 2"  (1.575 m), weight 96.9 kg, SpO2 92 %. Neurologic: Alert and oriented X 3, normal strength and tone. Normal symmetric reflexes. Normal coordination and gait Wound:CDI  Disposition: Home  Discharge Instructions    Diet - low sodium heart healthy   Complete by:  As directed    For home use only DME oxygen   Complete by:  As directed    Mode or (Route):  Nasal cannula   Liters per Minute:  2   Frequency:  Continuous (stationary and portable oxygen unit needed)   Oxygen conserving device:  Yes   Oxygen delivery system:  Gas   Increase activity slowly   Complete by:  As directed      Allergies as of 07/16/2018      Reactions   Sulfa Antibiotics Rash      Medication List    TAKE these medications   CALCIUM 600+D 600-800 MG-UNIT Tabs Generic drug:  Calcium Carb-Cholecalciferol Take 1 tablet by mouth daily with lunch.   celecoxib 200 MG capsule Commonly known as:  CELEBREX Take 200 mg by mouth daily.   citalopram 20 MG tablet Commonly known as:  CELEXA TAKE 1 TABLET DAILY   cyclobenzaprine 10 MG tablet Commonly known as:  FLEXERIL Take 1 tablet (10 mg total) by mouth  3 (three) times daily as needed for muscle spasms.   EYE VITAMINS & MINERALS Tabs Take 1  tablet by mouth daily.   levothyroxine 125 MCG tablet Commonly known as:  SYNTHROID, LEVOTHROID TAKE 1 TABLET DAILY BEFORE BREAKFAST   loperamide 2 MG capsule Commonly known as:  IMODIUM Take 2 mg by mouth every morning.   NON FORMULARY Apply 1 application topically 2 (two) times daily as needed (muscle pain). Horse Liniment   omeprazole 20 MG capsule Commonly known as:  PRILOSEC Take 1 capsule (20 mg total) by mouth daily.   oxyCODONE 5 MG immediate release tablet Commonly known as:  Oxy IR/ROXICODONE Take 1-2 tablets (5-10 mg total) by mouth every 4 (four) hours as needed for moderate pain or severe pain ((score 4 to 6)).   pravastatin 40 MG tablet Commonly known as:  PRAVACHOL Take 0.5 tablets (20 mg total) by mouth daily.   SLOW RELEASE IRON 45 MG Tbcr Generic drug:  Ferrous Sulfate Dried Take 45 mg by mouth daily with lunch.   SYSTANE BALANCE OP Place 1 drop into both eyes daily as needed (dry eyes).   TURMERIC PO Take 1 capsule by mouth daily at 12 noon.   TYLENOL 8 HOUR ARTHRITIS PAIN 650 MG CR tablet Generic drug:  acetaminophen Take 1,300 mg by mouth daily.            Durable Medical Equipment  (From admission, onward)         Start     Ordered   07/16/18 0000  For home use only DME oxygen    Question Answer Comment  Mode or (Route) Nasal cannula   Liters per Minute 2   Frequency Continuous (stationary and portable oxygen unit needed)   Oxygen conserving device Yes   Oxygen delivery system Gas      07/16/18 1206           Signed: Peggyann Shoals, MD 07/16/2018, 12:07 PM

## 2018-07-16 NOTE — Progress Notes (Signed)
Subjective: Patient reports doing well  Objective: Vital signs in last 24 hours: Temp:  [97.8 F (36.6 C)-99.2 F (37.3 C)] 97.8 F (36.6 C) (01/05 0807) Pulse Rate:  [79-84] 79 (01/05 0807) Resp:  [18] 18 (01/05 0300) BP: (120-143)/(63-75) 127/68 (01/05 0807) SpO2:  [92 %-96 %] 92 % (01/05 0807)  Intake/Output from previous day: 01/04 0701 - 01/05 0700 In: 1080 [P.O.:1080] Out: -  Intake/Output this shift: No intake/output data recorded.  Physical Exam: Ambulating in brace.  Leg pain improved.  No shortness of breath  Lab Results: Recent Labs    07/15/18 1621  WBC 10.0  HGB 10.7*  HCT 35.6*  PLT 178   BMET Recent Labs    07/15/18 1621  NA 134*  K 4.5  CL 93*  CO2 33*  GLUCOSE 116*  BUN 12  CREATININE 0.84  CALCIUM 8.8*    Studies/Results: Dg Chest 2 View  Result Date: 07/15/2018 CLINICAL DATA:  Fever and shortness of breath. EXAM: CHEST - 2 VIEW COMPARISON:  07/29/17 FINDINGS: Cardiac enlargement. Aortic atherosclerosis. Large hiatal hernia. No pleural effusion or edema. No airspace opacities. IMPRESSION: 1. No acute cardiopulmonary abnormalities. 2. Cardiac enlargement and aortic atherosclerosis. Aortic Atherosclerosis (ICD10-I70.0). 3. Large hiatal hernia. Electronically Signed   By: Kerby Moors M.D.   On: 07/15/2018 11:05    Assessment/Plan: Patient is doing well.  CCM input appreciated.  RA pO2 off supplemental oxygen was 85%.  Patient will need home oxygen on discharge.    LOS: 6 days    Peggyann Shoals, MD 07/16/2018, 9:35 AM

## 2018-07-16 NOTE — Progress Notes (Signed)
NAME:  Janet Rice, MRN:  [id number], DOB:  February 17, 1946, LOS: 6 ADMISSION DATE:  07/10/2018, CONSULTATION DATE:  07/15/18 REFERRING MD:  Newman Pies, MD CHIEF COMPLAINT:  Hypoxemia  History of present illness   Janet Rice is a89 year old female former smoker (4 pack-years, quit 40 years ago) with lumbar DDD and stenosis s/p L4-5 decompression who was admitted for L3-L4 laminotomy/foraminotomies/medial facetectomy for decompression on 07/10/18. No complications post-op. However she has remained hypoxemic on room air in no acute distress. Pulmonary consulted for hypoxemia.  She reports that she usually has hypoxemia after surgery and often required oxygen post-procedure that was required to wean down for a few days. She assumed this was a normal hospital course. She does have chronic dyspnea on exertion however she attributes this due to inactivity. Denies associated chest pain. At home, she is sedentary due to dyspnea and back pain. She reports laying in bed for two days after surgery and felt her dyspnea worsened with movement. Denies any symptoms at rest. Has used her incentive spirometry 5 times today and states she is able to reach 1000cc however only able to obtain 250cc at bedside with me.   Denies recent fevers, chills. Denies recent weight change. Reports occasional headache and fatigue. Does snore and brief episodes of witnessed apnea by husband but she does not believe this to be concerning. Denies nocturnal dypsnea. Does wake up 3-4 times nightly to urinate.  Past Medical History  Morbid obesity, lumbar DDD and stenosis, s/p L4-5 decompression, osteoporosis, hypothyroidism, hyperlipidemia, iron deficiency anemia   Significant Hospital Events   07/10/18 Admitted for lumbar laminotomy/foraminotomies/medial facetectomy  Consults:  Pulm 07/15/18  Procedures:  07/10/18  lumbar laminotomy/foraminotomies/medial facetectomy  Significant Diagnostic Tests:  CXR 07/15/2018  Reviewed independently myself - Low lung volumes with no effusion, edema or infiltrate. Enlarged cardiac silhouette   Micro Data:   Antimicrobials:   Interim history/subjective:  Diuresed yesterday. Requires O2 with ambulation. Denies dyspnea  Objective   Blood pressure (!) 156/89, pulse 69, temperature (!) 97 F (36.1 C), temperature source Oral, resp. rate 18, height 5\' 2"  (1.575 m), weight 96.9 kg, SpO2 97 %.        Intake/Output Summary (Last 24 hours) at 07/16/2018 1344 Last data filed at 07/16/2018 1200 Gross per 24 hour  Intake 1320 ml  Output -  Net 1320 ml   Filed Weights   07/10/18 1117  Weight: 96.9 kg    Physical Exam: General: Well-appearing, no acute distress HENT: [place], AT, OP clear, MMM Eyes: EOMI, no scleral icterus Respiratory: Improved right basilar crackles Cardiovascular: RRR, -M/R/G, no JVD GI: BS+, soft, nontender Extremities:-Edema,-tenderness Neuro: AAO x4, CNII-XII grossly intact  CXR 07/15/18 reviewed as noted above Prior TTE 07/14/08 with grade 1 diastolic dysfunction. EF 60-65% with no WMA.  No PFTs on file.  Labs reviewed independently by me Hg 10.7 BUN/Cr 12/0.84 CO2 33   Assessment & Plan:   Acute on chronic hypoxemic respiratory failure Probable obstructive sleep apnea Chronic diastolic heart failure Ambulatory oxygen saturations <88%. She will require continuous oxygen to maintain saturations.   Pulmonary consulted for O2 saturations 85% on RA. Lung exam is concerning for mild volume overload and post-op atelectasis. She may also have undiagnosed and untreated sleep apnea that may be contributing to her mild hypoxemia. She has a hx of anemia but doubt this is contributing as her Hg is near/above baseline.   Recommend: --S/p IV lasix 20 mg once --Continue Incentive spirometry --Discharge with  supplemental O2 with goal SpO2 >88%  --Outpatient sleep study  Patient requests to follow-up with me in Pulmonary clinic. Will arrange hospital  follow-up   Rodman Pickle, M.D. [facility] Pulmonary/Critical Care Medicine Pager: [phone] After hours pager: [phone]   Labs   CBC: Recent Labs  Lab 07/11/18 0148 07/15/18 1621  WBC 10.5 10.0  HGB 11.3* 10.7*  HCT 38.2 35.6*  MCV 86.4 84.2  PLT 160 323    Basic Metabolic Panel: Recent Labs  Lab 07/11/18 0148 07/15/18 1621  NA 140 134*  K 5.2* 4.5  CL 101 93*  CO2 30 33*  GLUCOSE 154* 116*  BUN 9 12  CREATININE 0.91 0.84  CALCIUM 9.1 8.8*   GFR: Estimated Creatinine Clearance: 65.8 mL/min (by C-G formula based on SCr of 0.84 mg/dL). Recent Labs  Lab 07/11/18 0148 07/15/18 1621  WBC 10.5 10.0    Liver Function Tests: No results for input(s): AST, ALT, ALKPHOS, BILITOT, PROT, ALBUMIN in the last 168 hours. No results for input(s): LIPASE, AMYLASE in the last 168 hours. No results for input(s): AMMONIA in the last 168 hours.  ABG No results found for: PHART, PCO2ART, PO2ART, HCO3, TCO2, ACIDBASEDEF, O2SAT   Coagulation Profile: No results for input(s): INR, PROTIME in the last 168 hours.  Cardiac Enzymes: No results for input(s): CKTOTAL, CKMB, CKMBINDEX, TROPONINI in the last 168 hours.  HbA1C: Hgb A1c MFr Bld  Date/Time Value Ref Range Status  11/03/2016 09:56 AM 5.6 4.8 - 5.6 % Final    Comment:    (NOTE)         Pre-diabetes: 5.7 - 6.4         Diabetes: >6.4         Glycemic control for adults with diabetes: <7.0   09/01/2016 10:04 AM 5.3 4.8 - 5.6 % Final    Comment:             Pre-diabetes: 5.7 - 6.4          Diabetes: >6.4          Glycemic control for adults with diabetes: <7.0     CBG: No results for input(s): GLUCAP in the last 168 hours.

## 2018-07-17 ENCOUNTER — Telehealth: Payer: Self-pay

## 2018-07-17 NOTE — Telephone Encounter (Signed)
Contacted patient for hospital follow up.  Doing better and states she can come in next week. Schedule for 07/27/2018 3:30pm.  Advised if need earlier appointment to please contact us.  Nothing further needed.

## 2018-07-18 ENCOUNTER — Ambulatory Visit: Payer: Medicare Other | Admitting: Oncology

## 2018-07-18 ENCOUNTER — Other Ambulatory Visit: Payer: Self-pay

## 2018-07-18 ENCOUNTER — Ambulatory Visit: Payer: Medicare Other

## 2018-07-18 DIAGNOSIS — M48062 Spinal stenosis, lumbar region with neurogenic claudication: Secondary | ICD-10-CM | POA: Diagnosis not present

## 2018-07-18 DIAGNOSIS — M5136 Other intervertebral disc degeneration, lumbar region: Secondary | ICD-10-CM | POA: Diagnosis not present

## 2018-07-18 DIAGNOSIS — R2689 Other abnormalities of gait and mobility: Secondary | ICD-10-CM | POA: Diagnosis not present

## 2018-07-18 DIAGNOSIS — M6281 Muscle weakness (generalized): Secondary | ICD-10-CM | POA: Diagnosis not present

## 2018-07-18 DIAGNOSIS — Z4789 Encounter for other orthopedic aftercare: Secondary | ICD-10-CM | POA: Diagnosis not present

## 2018-07-18 DIAGNOSIS — Z981 Arthrodesis status: Secondary | ICD-10-CM | POA: Diagnosis not present

## 2018-07-19 MED FILL — Heparin Sodium (Porcine) Inj 1000 Unit/ML: INTRAMUSCULAR | Qty: 30 | Status: AC

## 2018-07-19 MED FILL — Sodium Chloride IV Soln 0.9%: INTRAVENOUS | Qty: 1000 | Status: AC

## 2018-07-20 DIAGNOSIS — Z981 Arthrodesis status: Secondary | ICD-10-CM | POA: Diagnosis not present

## 2018-07-20 DIAGNOSIS — Z4789 Encounter for other orthopedic aftercare: Secondary | ICD-10-CM | POA: Diagnosis not present

## 2018-07-20 DIAGNOSIS — M48062 Spinal stenosis, lumbar region with neurogenic claudication: Secondary | ICD-10-CM | POA: Diagnosis not present

## 2018-07-20 DIAGNOSIS — M5136 Other intervertebral disc degeneration, lumbar region: Secondary | ICD-10-CM | POA: Diagnosis not present

## 2018-07-20 DIAGNOSIS — R2689 Other abnormalities of gait and mobility: Secondary | ICD-10-CM | POA: Diagnosis not present

## 2018-07-20 DIAGNOSIS — M6281 Muscle weakness (generalized): Secondary | ICD-10-CM | POA: Diagnosis not present

## 2018-07-21 DIAGNOSIS — M5136 Other intervertebral disc degeneration, lumbar region: Secondary | ICD-10-CM | POA: Diagnosis not present

## 2018-07-21 DIAGNOSIS — Z4789 Encounter for other orthopedic aftercare: Secondary | ICD-10-CM | POA: Diagnosis not present

## 2018-07-21 DIAGNOSIS — R2689 Other abnormalities of gait and mobility: Secondary | ICD-10-CM | POA: Diagnosis not present

## 2018-07-21 DIAGNOSIS — M6281 Muscle weakness (generalized): Secondary | ICD-10-CM | POA: Diagnosis not present

## 2018-07-21 DIAGNOSIS — M48062 Spinal stenosis, lumbar region with neurogenic claudication: Secondary | ICD-10-CM | POA: Diagnosis not present

## 2018-07-21 DIAGNOSIS — Z981 Arthrodesis status: Secondary | ICD-10-CM | POA: Diagnosis not present

## 2018-07-25 ENCOUNTER — Telehealth: Payer: Self-pay

## 2018-07-25 ENCOUNTER — Ambulatory Visit: Payer: Medicare Other | Admitting: Internal Medicine

## 2018-07-25 NOTE — Telephone Encounter (Signed)
Fax from [facility]   Refill request for synthroid 0.125 mg   Sent to [facility] patient

## 2018-07-26 DIAGNOSIS — M5136 Other intervertebral disc degeneration, lumbar region: Secondary | ICD-10-CM | POA: Diagnosis not present

## 2018-07-26 DIAGNOSIS — Z4789 Encounter for other orthopedic aftercare: Secondary | ICD-10-CM | POA: Diagnosis not present

## 2018-07-26 DIAGNOSIS — Z981 Arthrodesis status: Secondary | ICD-10-CM | POA: Diagnosis not present

## 2018-07-26 DIAGNOSIS — M48062 Spinal stenosis, lumbar region with neurogenic claudication: Secondary | ICD-10-CM | POA: Diagnosis not present

## 2018-07-26 DIAGNOSIS — M6281 Muscle weakness (generalized): Secondary | ICD-10-CM | POA: Diagnosis not present

## 2018-07-26 DIAGNOSIS — R2689 Other abnormalities of gait and mobility: Secondary | ICD-10-CM | POA: Diagnosis not present

## 2018-07-26 MED ORDER — LEVOTHYROXINE SODIUM 125 MCG PO TABS: 125.0000 ug | ORAL_TABLET | Freq: Every day | ORAL | 1 refills | Status: DC

## 2018-07-27 ENCOUNTER — Inpatient Hospital Stay: Payer: Medicare Other | Admitting: Pulmonary Disease

## 2018-07-27 DIAGNOSIS — M5136 Other intervertebral disc degeneration, lumbar region: Secondary | ICD-10-CM | POA: Diagnosis not present

## 2018-07-27 DIAGNOSIS — M48062 Spinal stenosis, lumbar region with neurogenic claudication: Secondary | ICD-10-CM | POA: Diagnosis not present

## 2018-07-27 DIAGNOSIS — R2689 Other abnormalities of gait and mobility: Secondary | ICD-10-CM | POA: Diagnosis not present

## 2018-07-27 DIAGNOSIS — M6281 Muscle weakness (generalized): Secondary | ICD-10-CM | POA: Diagnosis not present

## 2018-07-27 DIAGNOSIS — Z4789 Encounter for other orthopedic aftercare: Secondary | ICD-10-CM | POA: Diagnosis not present

## 2018-07-27 DIAGNOSIS — Z981 Arthrodesis status: Secondary | ICD-10-CM | POA: Diagnosis not present

## 2018-07-31 DIAGNOSIS — M5136 Other intervertebral disc degeneration, lumbar region: Secondary | ICD-10-CM | POA: Diagnosis not present

## 2018-07-31 DIAGNOSIS — Z4789 Encounter for other orthopedic aftercare: Secondary | ICD-10-CM | POA: Diagnosis not present

## 2018-07-31 DIAGNOSIS — Z981 Arthrodesis status: Secondary | ICD-10-CM | POA: Diagnosis not present

## 2018-07-31 DIAGNOSIS — R2689 Other abnormalities of gait and mobility: Secondary | ICD-10-CM | POA: Diagnosis not present

## 2018-07-31 DIAGNOSIS — M48062 Spinal stenosis, lumbar region with neurogenic claudication: Secondary | ICD-10-CM | POA: Diagnosis not present

## 2018-07-31 DIAGNOSIS — M6281 Muscle weakness (generalized): Secondary | ICD-10-CM | POA: Diagnosis not present

## 2018-08-01 DIAGNOSIS — M48062 Spinal stenosis, lumbar region with neurogenic claudication: Secondary | ICD-10-CM | POA: Diagnosis not present

## 2018-08-01 DIAGNOSIS — R030 Elevated blood-pressure reading, without diagnosis of hypertension: Secondary | ICD-10-CM | POA: Diagnosis not present

## 2018-08-01 DIAGNOSIS — Z6838 Body mass index (BMI) 38.0-38.9, adult: Secondary | ICD-10-CM | POA: Diagnosis not present

## 2018-08-07 DIAGNOSIS — H35351 Cystoid macular degeneration, right eye: Secondary | ICD-10-CM | POA: Diagnosis not present

## 2018-08-07 DIAGNOSIS — H35371 Puckering of macula, right eye: Secondary | ICD-10-CM | POA: Diagnosis not present

## 2018-08-22 ENCOUNTER — Ambulatory Visit (INDEPENDENT_AMBULATORY_CARE_PROVIDER_SITE_OTHER): Payer: Medicare Other | Admitting: Internal Medicine

## 2018-08-22 ENCOUNTER — Encounter: Payer: Self-pay | Admitting: Internal Medicine

## 2018-08-22 VITALS — BP 112/58 | HR 81 | Temp 99.0°F | Ht 62.0 in | Wt 208.8 lb

## 2018-08-22 DIAGNOSIS — K219 Gastro-esophageal reflux disease without esophagitis: Secondary | ICD-10-CM

## 2018-08-22 DIAGNOSIS — R739 Hyperglycemia, unspecified: Secondary | ICD-10-CM | POA: Diagnosis not present

## 2018-08-22 DIAGNOSIS — F32A Depression, unspecified: Secondary | ICD-10-CM

## 2018-08-22 DIAGNOSIS — F419 Anxiety disorder, unspecified: Secondary | ICD-10-CM | POA: Diagnosis not present

## 2018-08-22 DIAGNOSIS — F329 Major depressive disorder, single episode, unspecified: Secondary | ICD-10-CM | POA: Diagnosis not present

## 2018-08-22 DIAGNOSIS — E785 Hyperlipidemia, unspecified: Secondary | ICD-10-CM

## 2018-08-22 DIAGNOSIS — D509 Iron deficiency anemia, unspecified: Secondary | ICD-10-CM | POA: Diagnosis not present

## 2018-08-22 DIAGNOSIS — Z23 Encounter for immunization: Secondary | ICD-10-CM

## 2018-08-22 DIAGNOSIS — M810 Age-related osteoporosis without current pathological fracture: Secondary | ICD-10-CM

## 2018-08-22 DIAGNOSIS — E039 Hypothyroidism, unspecified: Secondary | ICD-10-CM

## 2018-08-22 DIAGNOSIS — Z1231 Encounter for screening mammogram for malignant neoplasm of breast: Secondary | ICD-10-CM

## 2018-08-22 DIAGNOSIS — M1990 Unspecified osteoarthritis, unspecified site: Secondary | ICD-10-CM | POA: Diagnosis not present

## 2018-08-22 MED ORDER — LEVOTHYROXINE SODIUM 125 MCG PO TABS: 125.0000 ug | ORAL_TABLET | Freq: Every day | ORAL | 3 refills | Status: DC

## 2018-08-22 MED ORDER — PRAVASTATIN SODIUM 40 MG PO TABS: 20.0000 mg | ORAL_TABLET | Freq: Every day | ORAL | 3 refills | Status: DC

## 2018-08-22 MED ORDER — OMEPRAZOLE 20 MG PO CPDR: 20.0000 mg | DELAYED_RELEASE_CAPSULE | Freq: Every day | ORAL | 3 refills | Status: DC

## 2018-08-22 MED ORDER — CITALOPRAM HYDROBROMIDE 20 MG PO TABS: 20.0000 mg | ORAL_TABLET | Freq: Every day | ORAL | 3 refills | Status: DC

## 2018-08-22 NOTE — Patient Instructions (Addendum)
Call to schedule mammogram and bone density    Iron Deficiency Anemia, Adult Iron deficiency anemia is a condition in which the concentration of red blood cells or hemoglobin in the blood is below normal because of too little iron. Hemoglobin is a substance in red blood cells that carries oxygen to the body's tissues. When the concentration of red blood cells or hemoglobin is too low, not enough oxygen reaches these tissues. Iron deficiency anemia is usually long-lasting (chronic) and it develops over time. It may or may not cause symptoms. It is a common type of anemia. What are the causes? This condition may be caused by:  Not enough iron in the diet.  Blood loss caused by bleeding in the intestine.  Blood loss from a gastrointestinal condition like Crohn disease.  Frequent blood draws, such as from blood donation.  Abnormal absorption in the gut.  Heavy menstrual periods in women.  Cancers of the gastrointestinal system, such as colon cancer. What are the signs or symptoms? Symptoms of this condition may include:  Fatigue.  Headache.  Pale skin, lips, and nail beds.  Poor appetite.  Weakness.  Shortness of breath.  Dizziness.  Cold hands and feet.  Fast or irregular heartbeat.  Irritability. This is more common in severe anemia.  Rapid breathing. This is more common in severe anemia. Mild anemia may not cause any symptoms. How is this diagnosed? This condition is diagnosed based on:  Your medical history.  A physical exam.  Blood tests. You may have additional tests to find the underlying cause of your anemia, such as:  Testing for blood in the stool (fecal occult blood test).  A procedure to see inside your colon and rectum (colonoscopy).  A procedure to see inside your esophagus and stomach (endoscopy).  A test in which cells are removed from bone marrow (bone marrow aspiration) or fluid is removed from the bone marrow to be examined (biopsy). This  is rarely needed. How is this treated? This condition is treated by correcting the cause of your iron deficiency. Treatment may involve:  Adding iron-rich foods to your diet.  Taking iron supplements. If you are pregnant or breastfeeding, you may need to take extra iron because your normal diet usually does not provide the amount of iron that you need.  Increasing vitamin C intake. Vitamin C helps your body absorb iron. Your health care provider may recommend that you take iron supplements along with a glass of orange juice or a vitamin C supplement.  Medicines to make heavy menstrual flow lighter.  Surgery. You may need repeat blood tests to determine whether treatment is working. Depending on the underlying cause, the anemia should be corrected within 2 months of starting treatment. If the treatment does not seem to be working, you may need more testing. Follow these instructions at home: Medicines  Take over-the-counter and prescription medicines only as told by your health care provider. This includes iron supplements and vitamins.  If you cannot tolerate taking iron supplements by mouth, talk with your health care provider about taking them through a vein (intravenously) or an injection into a muscle.  For the best iron absorption, you should take iron supplements when your stomach is empty. If you cannot tolerate them on an empty stomach, you may need to take them with food.  Do not drink milk or take antacids at the same time as your iron supplements. Milk and antacids may interfere with iron absorption.  Iron supplements can cause constipation.  To prevent constipation, include fiber in your diet as told by your health care provider. A stool softener may also be recommended. Eating and drinking   Talk with your health care provider before changing your diet. He or she may recommend that you eat foods that contain a lot of iron, such as: ? Liver. ? Low-fat (lean) beef. ? Breads  and cereals that have iron added to them (are fortified). ? Eggs. ? Dried fruit. ? Dark green, leafy vegetables.  To help your body use the iron from iron-rich foods, eat those foods at the same time as fresh fruits and vegetables that are high in vitamin C. Foods that are high in vitamin C include: ? Oranges. ? Peppers. ? Tomatoes. ? Mangoes.  Drinkenoughfluid to keep your urine clear or pale yellow. General instructions  Return to your normal activities as told by your health care provider. Ask your health care provider what activities are safe for you.  Practice good hygiene. Anemia can make you more prone to illness and infection.  Keep all follow-up visits as told by your health care provider. This is important. Contact a health care provider if:  You feel nauseous or you vomit.  You feel weak.  You have unexplained sweating.  You develop symptoms of constipation, such as: ? Having fewer than three bowel movements a week. ? Straining to have a bowel movement. ? Having stools that are hard, dry, or larger than normal. ? Feeling full or bloated. ? Pain in the lower abdomen. ? Not feeling relief after having a bowel movement. Get help right away if:  You faint. If this happens, do not drive yourself to the hospital. Call your local emergency services (911 in the [place]  You have chest pain.  You have shortness of breath that: ? Is severe. ? Gets worse with physical activity.  You have a rapid heartbeat.  You become light-headed when getting up from a sitting or lying down position. This information is not intended to replace advice given to you by your health care provider. Make sure you discuss any questions you have with your health care provider. Document Released: 06/25/2000 Document Revised: 03/17/2016 Document Reviewed: 03/17/2016 Elsevier Interactive Patient Education  2019 [employer].

## 2018-08-22 NOTE — Progress Notes (Signed)
Pre visit review using our clinic review tool, if applicable. No additional management support is needed unless otherwise documented below in the visit note. 

## 2018-08-23 ENCOUNTER — Encounter: Payer: Self-pay | Admitting: Internal Medicine

## 2018-08-23 DIAGNOSIS — F419 Anxiety disorder, unspecified: Secondary | ICD-10-CM | POA: Insufficient documentation

## 2018-08-23 DIAGNOSIS — K219 Gastro-esophageal reflux disease without esophagitis: Secondary | ICD-10-CM | POA: Insufficient documentation

## 2018-08-23 DIAGNOSIS — F329 Major depressive disorder, single episode, unspecified: Secondary | ICD-10-CM | POA: Insufficient documentation

## 2018-08-23 DIAGNOSIS — F32A Depression, unspecified: Secondary | ICD-10-CM | POA: Insufficient documentation

## 2018-08-23 NOTE — Progress Notes (Addendum)
Chief Complaint  Patient presents with  . Follow-up   Annual f/u  1. Iron def anemia appt iron infusion 09/2018  2. Arthritis spine, hips, and knees f/u with Dr. Raliegh Ip [facility] s/p back surgery lumbar fusion Dr. Arnoldo Morale she is 75% better still in som pain she is walking 1/2 mile  3. pna 23 vx due today   Review of Systems  Constitutional: Negative for weight loss.  HENT: Negative for hearing loss.   Eyes: Negative for blurred vision.  Respiratory: Negative for shortness of breath.   Cardiovascular: Negative for chest pain.  Gastrointestinal: Negative for abdominal pain.  Musculoskeletal: Positive for back pain.  Skin: Negative for rash.  Neurological: Negative for headaches.  Psychiatric/Behavioral: Negative for depression.   Past Medical History:  Diagnosis Date  . Adenomatous colon polyp   . Arthritis   . Childhood asthma    AS A CHILD  . Chronic heartburn    On omeprazole  . Complication of anesthesia    HARD TO WAKE UP  . Dyspnea    low hgb. and iron  chronic problem  . External hemorrhoids   . Frequency of urination   . Frequent loose stools   . GERD (gastroesophageal reflux disease)   . Heme positive stool   . History of hiatal hernia   . Hyperlipidemia   . Hypothyroidism   . Iron deficiency anemia   . Melanoma ([place])    left thigh s/p LN removal left groin  . Metastatic melanoma ([place])    Followed by Dr Oliva Bustard, previous chemo, no recurrence, 2008?  . Multinodular goiter   . SCC (squamous cell carcinoma)    skin   Past Surgical History:  Procedure Laterality Date  . ABDOMINAL HYSTERECTOMY  1984  . BACK SURGERY  03/2016   LUMBAR  . CARPAL TUNNEL RELEASE Right 09/07/2016   Procedure: CARPAL TUNNEL RELEASE;  Surgeon: Thornton Park, MD;  Location: [facility] ORS;  Service: Orthopedics;  Laterality: Right;  . CHOLECYSTECTOMY  1990  . COLONOSCOPY  08/2006   Four sessile polyps found and removed in sigmoid colon at splenic flexure and ascending colon. 7 mm in size.  Another removed from transverse colon 4 mm. Path Report - showed tubular adenoma and hyperplastic polyp. Advised to repeat in 3.5 years (02/2010)  . COLONOSCOPY  3.2.2011   8 mm polyp in sigmoid colon, removed, 4 mm polyp in descending colon, removed. Internal hemorrhoids  . ESOPHAGOGASTRODUODENOSCOPY  3.2.2011   Large hiatia hernia, esophagus normal, mulitple small sessile polyps w/no stigmata of recent bleeding found. Mildy erythermatous mucosa w/no bleeding found in gatric antrum. Normal duodenum. 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 PATH - no active inflammation, antral mucosa w/mild foveolar hyperplasia  . FRACTURE SURGERY    . Lymph Node Removal  12/2005   Left inguinal lymph node dissection  . MELANOMA EXCISION Left 1993   Left thigh  . ORIF ANKLE FRACTURE Right    Dr. Mack Guise  . TOTAL HIP ARTHROPLASTY Right 11/18/2016   Procedure: TOTAL HIP ARTHROPLASTY;  Surgeon: Thornton Park, MD;  Location: [facility] ORS;  Service: Orthopedics;  Laterality: Right;   Family History  Problem Relation Age of Onset  . Aneurysm Mother   . Heart disease Mother   . Colon polyps Father   . Diabetes Father   . Dementia Father   . Stroke Father        TIA  . Leukemia Maternal Grandfather   . Skin cancer Paternal Grandfather   .  Diabetes Other   . Colon polyps Other   . Colon cancer Neg Hx   . Breast cancer Neg Hx    Social History   Socioeconomic History  . Marital status: Married    Spouse name: Not on file  . Number of children: 2  . Years of education: Not on file  . Highest education level: Not on file  Occupational History  . Occupation: HR Surveyor, mining: [employer]  . Financial resource strain: Not hard at all  . Food insecurity:    Worry: Never true    Inability: Never true  . Transportation needs:    Medical: No    Non-medical: No  Tobacco Use  . Smoking status: Former Smoker    Packs/day: 0.50    Years: 8.00    Pack years: 4.00     Types: Cigarettes    Last attempt to quit: 07/12/1978    Years since quitting: 40.1  . Smokeless tobacco: Never Used  Substance and Sexual Activity  . Alcohol use: No    Alcohol/week: 0.0 standard drinks  . Drug use: No  . Sexual activity: Not Currently  Lifestyle  . Physical activity:    Days per week: 1 day    Minutes per session: 30 min  . Stress: Not at all  Relationships  . Social connections:    Talks on phone: Not on file    Gets together: Not on file    Attends religious service: Not on file    Active member of club or organization: Not on file    Attends meetings of clubs or organizations: Not on file    Relationship status: Not on file  . Intimate partner violence:    Fear of current or ex partner: No    Emotionally abused: No    Physically abused: No    Forced sexual activity: No  Other Topics Concern  . Not on file  Social History Narrative   Part time [employer]    Current Meds  Medication Sig  . acetaminophen (TYLENOL 8 HOUR ARTHRITIS PAIN) 650 MG CR tablet Take 1,300 mg by mouth daily.   . Calcium Carb-Cholecalciferol (CALCIUM 600+D) 600-800 MG-UNIT TABS Take 1 tablet by mouth daily with lunch.  . celecoxib (CELEBREX) 200 MG capsule Take 200 mg by mouth daily.   . citalopram (CELEXA) 20 MG tablet Take 1 tablet (20 mg total) by mouth daily.  . Ferrous Sulfate Dried (SLOW RELEASE IRON) 45 MG TBCR Take 45 mg by mouth daily with lunch.  . levothyroxine (SYNTHROID, LEVOTHROID) 125 MCG tablet Take 1 tablet (125 mcg total) by mouth daily before breakfast.  . loperamide (IMODIUM) 2 MG capsule Take 2 mg by mouth every morning.  . Multiple Vitamins-Minerals (EYE VITAMINS & MINERALS) TABS Take 1 tablet by mouth daily.   . NON FORMULARY Apply 1 application topically 2 (two) times daily as needed (muscle pain). Horse Liniment  . omeprazole (PRILOSEC) 20 MG capsule Take 1 capsule (20 mg total) by mouth daily.  . pravastatin (PRAVACHOL) 40 MG tablet Take 0.5 tablets (20 mg  total) by mouth daily.  Marland Kitchen Propylene Glycol (SYSTANE BALANCE OP) Place 1 drop into both eyes daily as needed (dry eyes).  . TURMERIC PO Take 1 capsule by mouth daily at 12 noon.  . [DISCONTINUED] citalopram (CELEXA) 20 MG tablet TAKE 1 TABLET DAILY  . [DISCONTINUED] levothyroxine (SYNTHROID, LEVOTHROID) 125 MCG tablet Take 1 tablet (125 mcg total) by mouth daily before  breakfast.  . [DISCONTINUED] omeprazole (PRILOSEC) 20 MG capsule Take 1 capsule (20 mg total) by mouth daily.  . [DISCONTINUED] pravastatin (PRAVACHOL) 40 MG tablet Take 0.5 tablets (20 mg total) by mouth daily.   Allergies  Allergen Reactions  . Sulfa Antibiotics Rash   Recent Results (from the past 2160 hour(s))  CBC w/Diff     Status: Abnormal   Collection Time: 05/29/18  2:10 PM  Result Value Ref Range   WBC 7.0 3.4 - 10.8 x10E3/uL   RBC 3.01 (L) 3.77 - 5.28 x10E6/uL   Hemoglobin 7.5 (L) 11.1 - 15.9 g/dL   Hematocrit 24.2 (L) 34.0 - 46.6 %   MCV 80 79 - 97 fL   MCH 24.9 (L) 26.6 - 33.0 pg   MCHC 31.0 (L) 31.5 - 35.7 g/dL   RDW 17.1 (H) 12.3 - 15.4 %   Platelets 170 150 - 450 x10E3/uL   Neutrophils 70 Not Estab. %   Lymphs 21 Not Estab. %   Monocytes 7 Not Estab. %   Eos 1 Not Estab. %   Basos 1 Not Estab. %   Neutrophils Absolute 4.8 1.4 - 7.0 x10E3/uL   Lymphocytes Absolute 1.5 0.7 - 3.1 x10E3/uL   Monocytes Absolute 0.5 0.1 - 0.9 x10E3/uL   EOS (ABSOLUTE) 0.1 0.0 - 0.4 x10E3/uL   Basophils Absolute 0.0 0.0 - 0.2 x10E3/uL   Immature Granulocytes 0 Not Estab. %   Immature Grans (Abs) 0.0 0.0 - 0.1 x10E3/uL  Iron, TIBC and Ferritin Panel     Status: Abnormal   Collection Time: 05/29/18  2:10 PM  Result Value Ref Range   Total Iron Binding Capacity 329 250 - 450 ug/dL   UIBC 317 118 - 369 ug/dL   Iron 12 (L) 27 - 139 ug/dL   Iron Saturation 4 (LL) 15 - 55 %   Ferritin 21 15 - 150 ng/mL  TSH     Status: None   Collection Time: 05/29/18  2:10 PM  Result Value Ref Range   TSH 0.671 0.450 - 4.500 uIU/mL   Basic metabolic panel     Status: None   Collection Time: 05/29/18  2:10 PM  Result Value Ref Range   Glucose 83 65 - 99 mg/dL   BUN 11 8 - 27 mg/dL   Creatinine, Ser 0.92 0.57 - 1.00 mg/dL   GFR calc non Af Amer 62 >59 mL/min/1.73   GFR calc Af Amer 72 >59 mL/min/1.73   BUN/Creatinine Ratio 12 12 - 28   Sodium 143 134 - 144 mmol/L   Potassium 5.1 3.5 - 5.2 mmol/L   Chloride 104 96 - 106 mmol/L   CO2 27 20 - 29 mmol/L   Calcium 8.9 8.7 - 10.3 mg/dL  Surgical pcr screen     Status: None   Collection Time: 06/01/18  2:13 PM  Result Value Ref Range   MRSA, PCR NEGATIVE NEGATIVE   Staphylococcus aureus NEGATIVE NEGATIVE    Comment: (NOTE) The Xpert SA Assay (FDA approved for NASAL specimens in patients 94 years of age and older), is one component of a comprehensive surveillance program. It is not intended to diagnose infection nor to guide or monitor treatment. Performed at [facility], [place] [address], [place], [place] [address]   Type and screen     Status: None   Collection Time: 06/01/18  2:17 PM  Result Value Ref Range   ABO/RH(D) AB NEG    Antibody Screen NEG    Sample Expiration  06/15/2018    Extend sample reason      NO TRANSFUSIONS OR PREGNANCY IN THE PAST 3 MONTHS Performed at [facility], [place] [address] [place], [place] [address]   Surgical pcr screen     Status: None   Collection Time: 06/30/18 10:14 AM  Result Value Ref Range   MRSA, PCR NEGATIVE NEGATIVE   Staphylococcus aureus NEGATIVE NEGATIVE    Comment: (NOTE) The Xpert SA Assay (FDA approved for NASAL specimens in patients 54 years of age and older), is one component of a comprehensive surveillance program. It is not intended to diagnose infection nor to guide or monitor treatment. Performed at [facility], [place] [address] [place], [place] [address]   CBC     Status: Abnormal   Collection Time: 06/30/18 10:14 AM  Result Value Ref Range   WBC 8.0 4.0 - 10.5 K/uL   RBC 4.24  3.87 - 5.11 MIL/uL   Hemoglobin 11.0 (L) 12.0 - 15.0 g/dL   HCT 38.6 36.0 - 46.0 %   MCV 91.0 80.0 - 100.0 fL   MCH 25.9 (L) 26.0 - 34.0 pg   MCHC 28.5 (L) 30.0 - 36.0 g/dL   RDW 18.4 (H) 11.5 - 15.5 %   Platelets 142 (L) 150 - 400 K/uL   nRBC 0.0 0.0 - 0.2 %    Comment: Performed at [facility], [place] [address], [place], [place] [address]  Basic metabolic panel     Status: Abnormal   Collection Time: 06/30/18 10:14 AM  Result Value Ref Range   Sodium 140 135 - 145 mmol/L   Potassium 5.0 3.5 - 5.1 mmol/L   Chloride 103 98 - 111 mmol/L   CO2 26 22 - 32 mmol/L   Glucose, Bld 108 (H) 70 - 99 mg/dL   BUN 8 8 - 23 mg/dL   Creatinine, Ser 0.94 0.44 - 1.00 mg/dL   Calcium 9.2 8.9 - 10.3 mg/dL   GFR calc non Af Amer >60 >60 mL/min   GFR calc Af Amer >60 >60 mL/min   Anion gap 11 5 - 15    Comment: Performed at [facility] [address] [place], [place] [address]  Type and screen [facility]     Status: None   Collection Time: 06/30/18 10:17 AM  Result Value Ref Range   ABO/RH(D) AB NEG    Antibody Screen NEG    Sample Expiration 07/14/2018    Extend sample reason      NO TRANSFUSIONS OR PREGNANCY IN THE PAST 3 MONTHS Performed at [facility], [place] [address], [place], [place] [address]   CBC     Status: Abnormal   Collection Time: 07/11/18  1:48 AM  Result Value Ref Range   WBC 10.5 4.0 - 10.5 K/uL   RBC 4.42 3.87 - 5.11 MIL/uL   Hemoglobin 11.3 (L) 12.0 - 15.0 g/dL   HCT 38.2 36.0 - 46.0 %   MCV 86.4 80.0 - 100.0 fL   MCH 25.6 (L) 26.0 - 34.0 pg   MCHC 29.6 (L) 30.0 - 36.0 g/dL   RDW 15.9 (H) 11.5 - 15.5 %   Platelets 160 150 - 400 K/uL   nRBC 0.0 0.0 - 0.2 %    Comment: Performed at [facility], [place] [address] [place], [place] [address]  Basic Metabolic Panel     Status: Abnormal   Collection Time: 07/11/18  1:48 AM  Result Value Ref Range   Sodium 140 135 - 145 mmol/L   Potassium 5.2 (H) 3.5 - 5.1 mmol/L   Chloride 101 98  - 111 mmol/L   CO2 30 22 - 32 mmol/L   Glucose, Bld 154 (H) 70 - 99 mg/dL   BUN 9 8 - 23 mg/dL   Creatinine, Ser 0.91 0.44 - 1.00 mg/dL   Calcium 9.1 8.9 - 10.3 mg/dL   GFR calc non Af Amer >60 >60 mL/min   GFR calc Af Amer >60 >60 mL/min   Anion gap 9 5 - 15    Comment: Performed at [facility] [address], [place], [place] [address]  CBC     Status: Abnormal   Collection Time: 07/15/18  4:21 PM  Result Value Ref Range   WBC 10.0 4.0 - 10.5 K/uL   RBC 4.23 3.87 - 5.11 MIL/uL   Hemoglobin 10.7 (L) 12.0 - 15.0 g/dL   HCT 35.6 (L) 36.0 - 46.0 %   MCV 84.2 80.0 - 100.0 fL   MCH 25.3 (L) 26.0 - 34.0 pg   MCHC 30.1 30.0 - 36.0 g/dL   RDW 15.8 (H) 11.5 - 15.5 %   Platelets 178 150 - 400 K/uL   nRBC 0.0 0.0 - 0.2 %    Comment: Performed at [facility] [address] [place], [place] [address]  Basic metabolic panel     Status: Abnormal   Collection Time: 07/15/18  4:21 PM  Result Value Ref Range   Sodium 134 (L) 135 - 145 mmol/L   Potassium 4.5 3.5 - 5.1 mmol/L   Chloride 93 (L) 98 - 111 mmol/L   CO2 33 (H) 22 - 32 mmol/L   Glucose, Bld 116 (H) 70 - 99 mg/dL   BUN 12 8 - 23 mg/dL   Creatinine, Ser 0.84 0.44 - 1.00 mg/dL   Calcium 8.8 (L) 8.9 - 10.3 mg/dL   GFR calc non Af Amer >60 >60 mL/min   GFR calc Af Amer >60 >60 mL/min   Anion gap 8 5 - 15    Comment: Performed at [facility] [address] [place], [place] [address]   Objective  Body mass index is 38.19 kg/m. Wt Readings from Last 3 Encounters:  08/22/18 208 lb 12.8 oz (94.7 kg)  07/10/18 213 lb 9.6 oz (96.9 kg)  06/30/18 213 lb 9.6 oz (96.9 kg)   Temp Readings from Last 3 Encounters:  08/22/18 99 F (37.2 C) (Oral)  07/16/18 (!) 97 F (36.1 C) (Oral)  06/30/18 98.6 F (37 C)   BP Readings from Last 3 Encounters:  08/22/18 (!) 112/58  07/16/18 (!) 156/89  06/30/18 (!) 125/55   Pulse Readings from Last 3 Encounters:  08/22/18 81  07/16/18 69  06/30/18 60    Physical  Exam Vitals signs and nursing note reviewed.  Constitutional:      Appearance: Normal appearance. She is well-developed and well-groomed. She is obese.  HENT:     Head: Normocephalic and atraumatic.     Nose: Nose normal.     Mouth/Throat:     Mouth: Mucous membranes are moist.     Pharynx: Oropharynx is clear.  Eyes:     Conjunctiva/sclera: Conjunctivae normal.     Pupils: Pupils are equal, round, and reactive to light.  Cardiovascular:     Rate and Rhythm: Normal rate and regular rhythm.     Heart sounds: Normal heart sounds.  Pulmonary:  Effort: Pulmonary effort is normal.     Breath sounds: Normal breath sounds.  Skin:    General: Skin is warm and dry.  Neurological:     General: No focal deficit present.     Mental Status: She is alert and oriented to person, place, and time. Mental status is at baseline.     Gait: Gait normal.  Psychiatric:        Attention and Perception: Attention and perception normal.        Mood and Affect: Mood and affect normal.        Speech: Speech normal.        Behavior: Behavior normal. Behavior is cooperative.        Thought Content: Thought content normal.        Cognition and Memory: Cognition and memory normal.        Judgment: Judgment normal.     Assessment   1. Iron def anemia  2. 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 HM 4. Anxiety and depression  Plan   1. Check iron with [facility] labs given form today f/u H/o 09/2018  2. F/u Dr. Raliegh Ip and Dr. Arnoldo Morale  3.  Had flu shot utd  pna 23 given today updated  Other vxs UTD except shingrix disc and givne Rx today   Ordered dexa last 05/18/12 osteopeorosis never took any meds  Hep C negative in the past per pt husband has hep C  Colonoscopy had 10/02/14 5 polyps repeat in 5 years.   mammo neg 06/27/17 pt to call to schedule   S/p hysterctomy age 55 y.o for cycts no h/o abnormal pap per pt    Will need to check lipid when fasting given [facility] form [facility], iron  A1C, vitamin D. Lipid, UA and TSH  Derm appt next Week Dr. Kellie Moor 08/31/18 Former smoker 40 years ago quit smoked <1 ppd total 6-7 years.  4. Refilled celexa 20 mg working   Left knee OA and left hip OA seen [facility] 10/03/18 Dr. Raliegh Ip given steroid injection f/u prn  NS 11/07/2018 Dr. Arnoldo Morale lumbar SS 4 months post op    Provider: Dr. Olivia Mackie [facility]

## 2018-08-31 ENCOUNTER — Other Ambulatory Visit: Payer: Self-pay | Admitting: Internal Medicine

## 2018-08-31 DIAGNOSIS — E039 Hypothyroidism, unspecified: Secondary | ICD-10-CM | POA: Diagnosis not present

## 2018-08-31 DIAGNOSIS — E559 Vitamin D deficiency, unspecified: Secondary | ICD-10-CM | POA: Diagnosis not present

## 2018-08-31 DIAGNOSIS — E785 Hyperlipidemia, unspecified: Secondary | ICD-10-CM | POA: Diagnosis not present

## 2018-08-31 DIAGNOSIS — R739 Hyperglycemia, unspecified: Secondary | ICD-10-CM | POA: Diagnosis not present

## 2018-08-31 DIAGNOSIS — D509 Iron deficiency anemia, unspecified: Secondary | ICD-10-CM | POA: Diagnosis not present

## 2018-09-01 ENCOUNTER — Telehealth: Payer: Self-pay | Admitting: Internal Medicine

## 2018-09-01 LAB — COMPREHENSIVE METABOLIC PANEL
ALT: 31 IU/L (ref 0–32)
AST: 32 IU/L (ref 0–40)
Albumin/Globulin Ratio: 1.7 (ref 1.2–2.2)
Albumin: 4.3 g/dL (ref 3.7–4.7)
Alkaline Phosphatase: 145 IU/L — ABNORMAL HIGH (ref 39–117)
BUN/Creatinine Ratio: 12 (ref 12–28)
BUN: 12 mg/dL (ref 8–27)
Bilirubin Total: 0.2 mg/dL (ref 0.0–1.2)
CO2: 26 mmol/L (ref 20–29)
Calcium: 9.5 mg/dL (ref 8.7–10.3)
Chloride: 98 mmol/L (ref 96–106)
Creatinine, Ser: 0.99 mg/dL (ref 0.57–1.00)
GFR calc Af Amer: 66 mL/min/{1.73_m2} (ref >59)
GFR calc non Af Amer: 57 mL/min/{1.73_m2} — ABNORMAL LOW (ref >59)
Globulin, Total: 2.5 g/dL (ref 1.5–4.5)
Glucose: 101 mg/dL — ABNORMAL HIGH (ref 65–99)
Potassium: 5.1 mmol/L (ref 3.5–5.2)
Sodium: 140 mmol/L (ref 134–144)
Total Protein: 6.8 g/dL (ref 6.0–8.5)

## 2018-09-01 LAB — URINALYSIS, ROUTINE W REFLEX MICROSCOPIC
Bilirubin, UA: NEGATIVE
Glucose, UA: NEGATIVE
Ketones, UA: NEGATIVE
Nitrite, UA: NEGATIVE
Specific Gravity, UA: 1.016 (ref 1.005–1.030)
Urobilinogen, Ur: 0.2 mg/dL (ref 0.2–1.0)
pH, UA: 7 (ref 5.0–7.5)

## 2018-09-01 LAB — MICROSCOPIC EXAMINATION
Casts: NONE SEEN /lpf
Epithelial Cells (non renal): >10 /hpf — AB (ref 0–10)

## 2018-09-01 LAB — IRON AND TIBC
Iron Saturation: 9 % — CL (ref 15–55)
Iron: 25 ug/dL — ABNORMAL LOW (ref 27–139)
Total Iron Binding Capacity: 280 ug/dL (ref 250–450)
UIBC: 255 ug/dL (ref 118–369)

## 2018-09-01 LAB — LIPID PANEL W/O CHOL/HDL RATIO
Cholesterol, Total: 226 mg/dL — ABNORMAL HIGH (ref 100–199)
HDL: 61 mg/dL (ref >39)
LDL Calculated: 126 mg/dL — ABNORMAL HIGH (ref 0–99)
Triglycerides: 196 mg/dL — ABNORMAL HIGH (ref 0–149)
VLDL Cholesterol Cal: 39 mg/dL (ref 5–40)

## 2018-09-01 LAB — HGB A1C W/O EAG: Hgb A1c MFr Bld: 5.8 % — ABNORMAL HIGH (ref 4.8–5.6)

## 2018-09-01 LAB — FERRITIN: Ferritin: 43 ng/mL (ref 15–150)

## 2018-09-01 LAB — TSH: TSH: 1.03 u[IU]/mL (ref 0.450–4.500)

## 2018-09-01 LAB — VITAMIN D 25 HYDROXY (VIT D DEFICIENCY, FRACTURES): Vit D, 25-Hydroxy: 27.1 ng/mL — ABNORMAL LOW (ref 30.0–100.0)

## 2018-09-01 NOTE — Telephone Encounter (Signed)
Labs [facility] 08/31/18  Kidney #s overall GFR overall kidney function 57, make sure increase water intake  Alkaline phosphatase elevated 145 slightly normal is 117  -would she be agreeable to [place] to work this up of abdomen?   Urine with blood is she having UTI sx's? -we need to repeat urine in future [facility] orders in   Total cholesterol 226 normal <200  Triglycerides 195 goal <150  LDL 126 high goal <100-130 HDL 61  -is she missing doses of pravastatin?   Iron is low follow up with hematology Iron 25, iron sat 9 TIBC 280 UIBC 255, ferritin 43   A1C 5.8=prediabetes   TSH 1.030 normal   Vitamin D 27.1 low  -rec D3 otc 5000 IU daily   TMS

## 2018-09-01 NOTE — Telephone Encounter (Signed)
[place] is to work up one of elevated liver enzymes to be thorough to make sure we are not missing anything in belly to cause elevated liver enzyme alkaline phosphatase   I recommend it   [place]

## 2018-09-01 NOTE — Telephone Encounter (Signed)
Patient was informed of results.  Patient understood and no questions, comments, or concerns at this time. She wants to know if [place] is necessary. No Uti sx.  She is not missing any dosages.

## 2018-09-04 NOTE — Telephone Encounter (Signed)
Left message for patient to return call back. P[facility] may give information.  

## 2018-09-11 ENCOUNTER — Other Ambulatory Visit: Payer: Self-pay | Admitting: Internal Medicine

## 2018-09-11 DIAGNOSIS — R748 Abnormal levels of other serum enzymes: Secondary | ICD-10-CM

## 2018-09-14 DIAGNOSIS — D509 Iron deficiency anemia, unspecified: Secondary | ICD-10-CM | POA: Diagnosis not present

## 2018-09-17 NOTE — Progress Notes (Signed)
[place]  [phone]  [phone]     ANI Janet Rice DOB: 12/28/1945  MR#: [id number]  HYI#:[id number]  Patient Care Team: McLean-Scocuzza, Nino Glow, MD as PCP - General (Internal Medicine)  CHIEF COMPLAINT: Iron deficiency anemia.  INTERVAL HISTORY: Patient returns to clinic today for repeat laboratory work and further evaluation.  She continues to have chronic weakness and fatigue, but otherwise feels well. She has no neurologic complaints. She denies any recent fevers or illnesses. She has a good appetite and denies weight loss.  She denies any chest pain, cough, or hemoptysis.  She denies any nausea, vomiting, constipation, or diarrhea. She has no melena or hematochezia. She has no urinary complaints.  Patient offers no further specific complaints today.  REVIEW OF SYSTEMS:   Review of Systems  Constitutional: Positive for malaise/fatigue. Negative for fever and weight loss.  Eyes: Negative.   Respiratory: Negative.  Negative for cough and shortness of breath.   Cardiovascular: Negative.  Negative for chest pain and leg swelling.  Gastrointestinal: Negative.  Negative for abdominal pain, blood in stool and melena.  Genitourinary: Negative.  Negative for hematuria.  Musculoskeletal: Positive for back pain and joint pain.  Skin: Negative.  Negative for rash.  Neurological: Positive for weakness. Negative for sensory change and focal weakness.  Psychiatric/Behavioral: Negative.  The patient is not nervous/anxious.     As per HPI. Otherwise, a complete review of systems is negative.  ONCOLOGY HISTORY:  No history exists.    PAST MEDICAL HISTORY: Past Medical History:  Diagnosis Date  . Adenomatous colon polyp   . Arthritis   . Childhood asthma    AS A CHILD  . Chronic heartburn    On omeprazole  . Complication of anesthesia    HARD TO WAKE UP  . Dyspnea    low hgb. and iron  chronic problem  . External hemorrhoids   . Frequency  of urination   . Frequent loose stools   . GERD (gastroesophageal reflux disease)   . Heme positive stool   . History of hiatal hernia   . Hyperlipidemia   . Hypothyroidism   . Iron deficiency anemia   . Melanoma ([place])    left thigh s/p LN removal left groin  . Metastatic melanoma ([place])    Followed by Dr Oliva Bustard, previous chemo, no recurrence, 2008?  . Multinodular goiter   . SCC (squamous cell carcinoma)    skin    PAST SURGICAL HISTORY: Past Surgical History:  Procedure Laterality Date  . ABDOMINAL HYSTERECTOMY  1984  . BACK SURGERY  03/2016   LUMBAR  . CARPAL TUNNEL RELEASE Right 09/07/2016   Procedure: CARPAL TUNNEL RELEASE;  Surgeon: Thornton Park, MD;  Location: [facility] ORS;  Service: Orthopedics;  Laterality: Right;  . CHOLECYSTECTOMY  1990  . COLONOSCOPY  08/2006   Four sessile polyps found and removed in sigmoid colon at splenic flexure and ascending colon. 7 mm in size. Another removed from transverse colon 4 mm. Path Report - showed tubular adenoma and hyperplastic polyp. Advised to repeat in 3.5 years (02/2010)  . COLONOSCOPY  3.2.2011   8 mm polyp in sigmoid colon, removed, 4 mm polyp in descending colon, removed. Internal hemorrhoids  . ESOPHAGOGASTRODUODENOSCOPY  3.2.2011   Large hiatia hernia, esophagus normal, mulitple small sessile polyps w/no stigmata of recent bleeding found. Mildy erythermatous mucosa w/no bleeding found in gatric antrum. Normal duodenum. 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 PATH - no active inflammation, antral  mucosa w/mild foveolar hyperplasia  . FRACTURE SURGERY    . Lymph Node Removal  12/2005   Left inguinal lymph node dissection  . MELANOMA EXCISION Left 1993   Left thigh  . ORIF ANKLE FRACTURE Right    Dr. Mack Guise  . TOTAL HIP ARTHROPLASTY Right 11/18/2016   Procedure: TOTAL HIP ARTHROPLASTY;  Surgeon: Thornton Park, MD;  Location: [facility] OR[facility];  Service: Orthopedics;  Laterality: Right;    FAMILY HISTORY Family  History  Problem Relation Age of Onset  . Aneurysm Mother   . Heart disease Mother   . Colon polyps Father   . Diabetes Father   . Dementia Father   . Stroke Father        TIA  . Leukemia Maternal Grandfather   . Skin cancer Paternal Grandfather   . Diabetes Other   . Colon polyps Other   . Colon cancer Neg Hx   . Breast cancer Neg Hx     GYNECOLOGIC HISTORY:  No LMP recorded. Patient has had a hysterectomy.     ADVANCED DIRECTIVES:    HEALTH MAINTENANCE: Social History   Tobacco Use  . Smoking status: Former Smoker    Packs/day: 0.50    Years: 8.00    Pack years: 4.00    Types: Cigarettes    Last attempt to quit: 07/12/1978    Years since quitting: 40.2  . Smokeless tobacco: Never Used  Substance Use Topics  . Alcohol use: No    Alcohol/week: 0.0 standard drinks  . Drug use: No    Allergies  Allergen Reactions  . Sulfa Antibiotics Rash    Current Outpatient Medications  Medication Sig Dispense Refill  . acetaminophen (TYLENOL 8 HOUR ARTHRITIS PAIN) 650 MG CR tablet Take 1,300 mg by mouth daily.     . Calcium Carb-Cholecalciferol (CALCIUM 600+D) 600-800 MG-UNIT TABS Take 1 tablet by mouth daily with lunch.    . celecoxib (CELEBREX) 200 MG capsule Take 200 mg by mouth daily.     . citalopram (CELEXA) 20 MG tablet Take 1 tablet (20 mg total) by mouth daily. 90 tablet 3  . Ferrous Sulfate Dried (SLOW RELEASE IRON) 45 MG TBCR Take 45 mg by mouth daily with lunch.    . levothyroxine (SYNTHROID, LEVOTHROID) 125 MCG tablet Take 1 tablet (125 mcg total) by mouth daily before breakfast. 90 tablet 3  . loperamide (IMODIUM) 2 MG capsule Take 2 mg by mouth every morning.    . Multiple Vitamins-Minerals (EYE VITAMINS & MINERALS) TABS Take 1 tablet by mouth daily.     . NON FORMULARY Apply 1 application topically 2 (two) times daily as needed (muscle pain). Horse Liniment    . omeprazole (PRILOSEC) 20 MG capsule Take 1 capsule (20 mg total) by mouth daily. 90 capsule 3  .  pravastatin (PRAVACHOL) 40 MG tablet Take 0.5 tablets (20 mg total) by mouth daily. 45 tablet 3  . Propylene Glycol (SYSTANE BALANCE OP) Place 1 drop into both eyes daily as needed (dry eyes).    . TURMERIC PO Take 1 capsule by mouth daily at 12 noon.     No current facility-administered medications for this visit.     OBJECTIVE: BP 116/69 (BP Location: Left Arm, Patient Position: Sitting)   Pulse 68   Temp (!) 96 F (35.6 C) (Tympanic)   Resp 18   Wt 210 lb 9 oz (95.5 kg)   BMI 38.51 kg/m    Body mass index is 38.51 kg/m.    ECOG  FS:0 - Asymptomatic  General: Well-developed, well-nourished, no acute distress. Eyes: Pink conjunctiva, anicteric sclera. HEENT: Normocephalic, moist mucous membranes. Lungs: Clear to auscultation bilaterally. Heart: Regular rate and rhythm. No rubs, murmurs, or gallops. Abdomen: Soft, nontender, nondistended. No organomegaly noted, normoactive bowel sounds. Musculoskeletal: No edema, cyanosis, or clubbing. Neuro: Alert, answering all questions appropriately. Cranial nerves grossly intact. Skin: No rashes or petechiae noted. Psych: Normal affect.   LAB RESULTS:  December 27, 2016: WBC 6.6, hemoglobin 10.4, platelet 144. Total iron 20, iron saturation 7%, ferritin 55. April 27, 2017: WBC 6.8, hemoglobin 11.3, platelets 151. Total iron 26, iron saturation 8%, ferritin 24. September 23, 2017: WBC 7.5, hemoglobin 10.9, platelet 165.  Total iron 18, iron saturation 5%, ferritin 21. December 22, 2017: WBC 6.9, hemoglobin 10.3, platelets 159.  Total iron 22, iron saturation 7%, ferritin 22. April 11, 2018: WBC 8.1, hemoglobin 11.5, platelets 190.  Total iron 25, iron saturation 7%, ferrin 24. September 14, 2018: WBC 8.1, hemoglobin 10.2, platelets 192.  Total iron 20, iron saturation 7%, ferritin 32.  CBC    Component Value Date/Time   WBC 10.0 07/15/2018 1621   RBC 4.23 07/15/2018 1621   HGB 10.7 (L) 07/15/2018 1621   HGB 7.5 (L) 05/29/2018 1410   HCT 35.6 (L)  07/15/2018 1621   HCT 24.2 (L) 05/29/2018 1410   PLT 178 07/15/2018 1621   PLT 170 05/29/2018 1410   MCV 84.2 07/15/2018 1621   MCV 80 05/29/2018 1410   MCV 74 (L) 12/12/2013 1015   MCH 25.3 (L) 07/15/2018 1621   MCHC 30.1 07/15/2018 1621   RDW 15.8 (H) 07/15/2018 1621   RDW 17.1 (H) 05/29/2018 1410   RDW 33.6 (H) 12/12/2013 1015   LYMPHSABS 1.5 05/29/2018 1410   LYMPHSABS 1.2 12/12/2013 1015   MONOABS 0.4 11/03/2016 0956   MONOABS 0.4 12/12/2013 1015   EOSABS 0.1 05/29/2018 1410   EOSABS 0.1 12/12/2013 1015   BASOSABS 0.0 05/29/2018 1410   BASOSABS 0.1 12/12/2013 1015   Lab Results  Component Value Date   IRON 25 (L) 08/31/2018   TIBC 280 08/31/2018   IRONPCTSAT 9 (LL) 08/31/2018   Lab Results  Component Value Date   FERRITIN 43 08/31/2018      STUDIES: No results found.  ASSESSMENT & PLAN:    1. Iron deficiency anemia: Patient reportedly had a normal colonoscopy in March 2016.  Patient's hemoglobin has trended down to 10.2.  Her iron stores are also significantly decreased.  Proceed with 510 mg IV Feraheme today.  Return to clinic in 1 week for second infusion.  Patient will then return to clinic in 3 months for further evaluation and continuation of treatment if necessary.  Patient gets her labwork completed at [facility].  2. History of melanoma: Melanoma of left thigh status post resection in 1993, exact stage is not known, metastatic to inguinal lymph node. No evidence of recurrent disease. 3. Back Pain: Chronic and unchanged.  Patient had surgery in September 2017.  4. Hypertension: Patient's blood pressure is within normal limits today.  I spent a total of 30 minutes face-to-face with the patient of which greater than 50% of the visit was spent in counseling and coordination of care as detailed above.   Patient expressed understanding and was in agreement with this plan. She also understands that She can call clinic at any time with any questions, concerns, or  complaints.    Lloyd Huger, MD   09/22/2018 6:50 AM

## 2018-09-19 ENCOUNTER — Other Ambulatory Visit: Payer: Self-pay

## 2018-09-19 ENCOUNTER — Inpatient Hospital Stay: Payer: Medicare Other | Attending: Oncology | Admitting: Oncology

## 2018-09-19 ENCOUNTER — Inpatient Hospital Stay: Payer: Medicare Other

## 2018-09-19 VITALS — BP 116/69 | HR 68 | Temp 96.0°F | Resp 18 | Wt 210.6 lb

## 2018-09-19 DIAGNOSIS — G8929 Other chronic pain: Secondary | ICD-10-CM | POA: Insufficient documentation

## 2018-09-19 DIAGNOSIS — M549 Dorsalgia, unspecified: Secondary | ICD-10-CM | POA: Diagnosis not present

## 2018-09-19 DIAGNOSIS — D508 Other iron deficiency anemias: Secondary | ICD-10-CM

## 2018-09-19 DIAGNOSIS — R531 Weakness: Secondary | ICD-10-CM | POA: Insufficient documentation

## 2018-09-19 DIAGNOSIS — R5383 Other fatigue: Secondary | ICD-10-CM | POA: Diagnosis not present

## 2018-09-19 DIAGNOSIS — D509 Iron deficiency anemia, unspecified: Secondary | ICD-10-CM | POA: Insufficient documentation

## 2018-09-19 DIAGNOSIS — I10 Essential (primary) hypertension: Secondary | ICD-10-CM | POA: Diagnosis not present

## 2018-09-19 MED ORDER — SODIUM CHLORIDE 0.9 % IV SOLN
INTRAVENOUS | Status: DC
  Administered 2018-09-19: 14:00:00 via INTRAVENOUS
  Filled 2018-09-19: qty 250

## 2018-09-19 MED ORDER — SODIUM CHLORIDE 0.9 % IV SOLN
510.0000 mg | Freq: Once | INTRAVENOUS | Status: AC
  Administered 2018-09-19: 510 mg via INTRAVENOUS
  Filled 2018-09-19: qty 17

## 2018-09-19 NOTE — Progress Notes (Signed)
Patient offers no complaints today. 

## 2018-09-20 ENCOUNTER — Encounter: Payer: Self-pay | Admitting: Oncology

## 2018-09-25 ENCOUNTER — Inpatient Hospital Stay: Payer: Medicare Other

## 2018-09-25 ENCOUNTER — Encounter: Payer: Self-pay | Admitting: Internal Medicine

## 2018-09-25 ENCOUNTER — Other Ambulatory Visit: Payer: Self-pay

## 2018-09-25 VITALS — BP 111/63 | HR 60 | Temp 96.7°F | Resp 18

## 2018-09-25 DIAGNOSIS — R531 Weakness: Secondary | ICD-10-CM | POA: Diagnosis not present

## 2018-09-25 DIAGNOSIS — I10 Essential (primary) hypertension: Secondary | ICD-10-CM | POA: Diagnosis not present

## 2018-09-25 DIAGNOSIS — D508 Other iron deficiency anemias: Secondary | ICD-10-CM

## 2018-09-25 DIAGNOSIS — D509 Iron deficiency anemia, unspecified: Secondary | ICD-10-CM | POA: Diagnosis not present

## 2018-09-25 DIAGNOSIS — M549 Dorsalgia, unspecified: Secondary | ICD-10-CM | POA: Diagnosis not present

## 2018-09-25 DIAGNOSIS — G8929 Other chronic pain: Secondary | ICD-10-CM | POA: Diagnosis not present

## 2018-09-25 DIAGNOSIS — R5383 Other fatigue: Secondary | ICD-10-CM | POA: Diagnosis not present

## 2018-09-25 MED ORDER — SODIUM CHLORIDE 0.9 % IV SOLN
510.0000 mg | Freq: Once | INTRAVENOUS | Status: AC
  Administered 2018-09-25: 510 mg via INTRAVENOUS
  Filled 2018-09-25: qty 17

## 2018-09-25 MED ORDER — SODIUM CHLORIDE 0.9 % IV SOLN
Freq: Once | INTRAVENOUS | Status: AC
  Administered 2018-09-25: 14:00:00 via INTRAVENOUS
  Filled 2018-09-25: qty 250

## 2018-09-29 DIAGNOSIS — M1712 Unilateral primary osteoarthritis, left knee: Secondary | ICD-10-CM | POA: Diagnosis not present

## 2018-09-29 DIAGNOSIS — M1612 Unilateral primary osteoarthritis, left hip: Secondary | ICD-10-CM | POA: Diagnosis not present

## 2018-10-26 ENCOUNTER — Ambulatory Visit: Payer: Medicare Other

## 2018-11-07 DIAGNOSIS — M48062 Spinal stenosis, lumbar region with neurogenic claudication: Secondary | ICD-10-CM | POA: Diagnosis not present

## 2018-12-08 ENCOUNTER — Ambulatory Visit
Admission: RE | Admit: 2018-12-08 | Discharge: 2018-12-08 | Disposition: A | Discharge status: Home / Self Care | Payer: Medicare Other | Source: Ambulatory Visit | Attending: Internal Medicine | Admitting: Internal Medicine

## 2018-12-08 ENCOUNTER — Other Ambulatory Visit: Payer: Self-pay

## 2018-12-08 DIAGNOSIS — R748 Abnormal levels of other serum enzymes: Secondary | ICD-10-CM | POA: Diagnosis not present

## 2018-12-08 DIAGNOSIS — N281 Cyst of kidney, acquired: Secondary | ICD-10-CM | POA: Diagnosis not present

## 2018-12-12 ENCOUNTER — Ambulatory Visit: Payer: Self-pay

## 2018-12-12 ENCOUNTER — Encounter: Payer: Self-pay | Admitting: Internal Medicine

## 2018-12-12 NOTE — Telephone Encounter (Signed)
Pt's c/o SOB for te past 2.5 weeks. Cough is nonproductive. [facility] notified employees that vial swabbing and antibody testing is available and will be covered by [facility]. Pt stated that her PCP is supposed to order the testing.  Pt denies SOB at rest. Pt thinks the SOB is due to less physical activity. Pt requests phone call regarding need for OV or not.  Reason for Disposition . COVID-19 Testing, questions about  Answer Assessment - Initial Assessment Questions 1. COVID-19 DIAGNOSIS: "Who made your Coronavirus (COVID-19) diagnosis?" "Was it confirmed by a positive lab test?" If not diagnosed by a HCP, ask "Are there lots of cases (community spread) where you live?" (See public health department website, if unsure)     [facility] offering pt's free antibody tests 2. ONSET: "When did the COVID-19 symptoms start?"      SOB 3.5 weeks ago 3. WORST SYMPTOM: "What is your worst symptom?" (e.g., cough, fever, shortness of breath, muscle aches)     SOB- with exertion 4. COUGH: "Do you have a cough?" If so, ask: "How bad is the cough?"       no 5. FEVER: "Do you have a fever?" If so, ask: "What is your temperature, how was it measured, and when did it start?"     no 6. RESPIRATORY STATUS: "Describe your breathing?" (e.g., shortness of breath, wheezing, unable to speak)      SOB 7. BETTER-SAME-WORSE: "Are you getting better, staying the same or getting worse compared to yesterday?"  If getting worse, ask, "In what way?"     Staying the same 8. HIGH RISK DISEASE: "Do you have any chronic medical problems?" (e.g., asthma, heart or lung disease, weak immune system, etc.)  no 9. PREGNANCY: "Is there any chance you are pregnant?" "When was your last menstrual period?"     n/a 10. OTHER SYMPTOMS: "Do you have any other symptoms?"  (e.g., chills, fatigue, headache, loss of smell or taste, muscle pain, sore throat)       no  Protocols used: CORONAVIRUS (COVID-19) DIAGNOSED OR SUSPECTED-A-AH

## 2018-12-12 NOTE — Telephone Encounter (Signed)
Pt requesting Covid -19 antibody testing. Pt is an employee of [employer]. Pt  This encounter was created in error - please disregard.

## 2018-12-13 ENCOUNTER — Ambulatory Visit (INDEPENDENT_AMBULATORY_CARE_PROVIDER_SITE_OTHER): Payer: Medicare Other | Admitting: Internal Medicine

## 2018-12-13 ENCOUNTER — Other Ambulatory Visit: Payer: Self-pay

## 2018-12-13 ENCOUNTER — Encounter: Payer: Self-pay | Admitting: Internal Medicine

## 2018-12-13 DIAGNOSIS — Z20828 Contact with and (suspected) exposure to other viral communicable diseases: Secondary | ICD-10-CM | POA: Diagnosis not present

## 2018-12-13 DIAGNOSIS — E559 Vitamin D deficiency, unspecified: Secondary | ICD-10-CM

## 2018-12-13 DIAGNOSIS — R7303 Prediabetes: Secondary | ICD-10-CM | POA: Diagnosis not present

## 2018-12-13 DIAGNOSIS — Z1329 Encounter for screening for other suspected endocrine disorder: Secondary | ICD-10-CM

## 2018-12-13 DIAGNOSIS — E785 Hyperlipidemia, unspecified: Secondary | ICD-10-CM | POA: Diagnosis not present

## 2018-12-13 DIAGNOSIS — Z20822 Contact with and (suspected) exposure to covid-19: Secondary | ICD-10-CM

## 2018-12-13 DIAGNOSIS — Z1389 Encounter for screening for other disorder: Secondary | ICD-10-CM

## 2018-12-13 DIAGNOSIS — E039 Hypothyroidism, unspecified: Secondary | ICD-10-CM | POA: Diagnosis not present

## 2018-12-13 DIAGNOSIS — D509 Iron deficiency anemia, unspecified: Secondary | ICD-10-CM

## 2018-12-13 NOTE — Progress Notes (Addendum)
Virtual Visit via Video Note  I connected with Janet Rice   on 12/13/18 at  1:30 PM EDT by a video enabled telemedicine application and verified that I am speaking with the correct person using two identifiers.  Location patient: home Location provider:work  Persons participating in the virtual visit: patient, provider  I discussed the limitations of evaluation and management by telemedicine and the availability of in person appointments. The patient expressed understanding and agreed to proceed.   HPI: Pt is [facility] employee and c/w exposure to covid and they are offering test I.e covid 19 antigen and antibody test and she wants testing due to husband has a h/o transplant. She does not have sx's at this time, no sob, fever, body ache, sore throat, chills, cough. [facility] testing is free and wants the test. At this time w/o COVID 19 sx's she declines antigen testing and wants antibody testing she is wearing a mask   Chronic pain 2/2 arthritis.     ROS: See pertinent positives and negatives per HPI.  Past Medical History:  Diagnosis Date  . Adenomatous colon polyp   . Arthritis   . Childhood asthma    AS A CHILD  . Chronic heartburn    On omeprazole  . Complication of anesthesia    HARD TO WAKE UP  . Dyspnea    low hgb. and iron  chronic problem  . External hemorrhoids   . Frequency of urination   . Frequent loose stools   . GERD (gastroesophageal reflux disease)   . Heme positive stool   . History of hiatal hernia   . Hyperlipidemia   . Hypothyroidism   . Iron deficiency anemia   . Melanoma ([place])    left thigh s/p LN removal left groin  . Metastatic melanoma ([place])    Followed by Dr Oliva Bustard, previous chemo, no recurrence, 2008?  . Multinodular goiter   . SCC (squamous cell carcinoma)    skin    Past Surgical History:  Procedure Laterality Date  . ABDOMINAL HYSTERECTOMY  1984  . BACK SURGERY  03/2016   LUMBAR  . CARPAL TUNNEL RELEASE Right 09/07/2016    Procedure: CARPAL TUNNEL RELEASE;  Surgeon: Thornton Park, MD;  Location: [facility] ORS;  Service: Orthopedics;  Laterality: Right;  . CHOLECYSTECTOMY  1990  . COLONOSCOPY  08/2006   Four sessile polyps found and removed in sigmoid colon at splenic flexure and ascending colon. 7 mm in size. Another removed from transverse colon 4 mm. Path Report - showed tubular adenoma and hyperplastic polyp. Advised to repeat in 3.5 years (02/2010)  . COLONOSCOPY  3.2.2011   8 mm polyp in sigmoid colon, removed, 4 mm polyp in descending colon, removed. Internal hemorrhoids  . ESOPHAGOGASTRODUODENOSCOPY  3.2.2011   Large hiatia hernia, esophagus normal, mulitple small sessile polyps w/no stigmata of recent bleeding found. Mildy erythermatous mucosa w/no bleeding found in gatric antrum. Normal duodenum. 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 PATH - no active inflammation, antral mucosa w/mild foveolar hyperplasia  . FRACTURE SURGERY    . Lymph Node Removal  12/2005   Left inguinal lymph node dissection  . MELANOMA EXCISION Left 1993   Left thigh  . ORIF ANKLE FRACTURE Right    Dr. Mack Guise  . TOTAL HIP ARTHROPLASTY Right 11/18/2016   Procedure: TOTAL HIP ARTHROPLASTY;  Surgeon: Thornton Park, MD;  Location: [facility] OR[facility];  Service: Orthopedics;  Laterality: Right;    Family History  Problem Relation Age of Onset  .  Aneurysm Mother   . Heart disease Mother   . Colon polyps Father   . Diabetes Father   . Dementia Father   . Stroke Father        TIA  . Leukemia Maternal Grandfather   . Skin cancer Paternal Grandfather   . Diabetes Other   . Colon polyps Other   . Colon cancer Neg Hx   . Breast cancer Neg Hx     SOCIAL HX: part time [employer]    Current Outpatient Medications:  .  acetaminophen (TYLENOL 8 HOUR ARTHRITIS PAIN) 650 MG CR tablet, Take 1,300 mg by mouth daily. , Disp: , Rfl:  .  Calcium Carb-Cholecalciferol (CALCIUM 600+D) 600-800 MG-UNIT TABS, Take 1 tablet by mouth daily with  lunch., Disp: , Rfl:  .  celecoxib (CELEBREX) 200 MG capsule, Take 200 mg by mouth daily. , Disp: , Rfl:  .  citalopram (CELEXA) 20 MG tablet, Take 1 tablet (20 mg total) by mouth daily., Disp: 90 tablet, Rfl: 3 .  Ferrous Sulfate Dried (SLOW RELEASE IRON) 45 MG TBCR, Take 45 mg by mouth daily with lunch., Disp: , Rfl:  .  levothyroxine (SYNTHROID, LEVOTHROID) 125 MCG tablet, Take 1 tablet (125 mcg total) by mouth daily before breakfast., Disp: 90 tablet, Rfl: 3 .  loperamide (IMODIUM) 2 MG capsule, Take 2 mg by mouth every morning., Disp: , Rfl:  .  Multiple Vitamins-Minerals (EYE VITAMINS & MINERALS) TABS, Take 1 tablet by mouth daily. , Disp: , Rfl:  .  NON FORMULARY, Apply 1 application topically 2 (two) times daily as needed (muscle pain). Horse Liniment, Disp: , Rfl:  .  omeprazole (PRILOSEC) 20 MG capsule, Take 1 capsule (20 mg total) by mouth daily., Disp: 90 capsule, Rfl: 3 .  pravastatin (PRAVACHOL) 40 MG tablet, Take 0.5 tablets (20 mg total) by mouth daily., Disp: 45 tablet, Rfl: 3 .  Propylene Glycol (SYSTANE BALANCE OP), Place 1 drop into both eyes daily as needed (dry eyes)., Disp: , Rfl:  .  TURMERIC PO, Take 1 capsule by mouth daily at 12 noon., Disp: , Rfl:   EXAM:  VITALS per patient if applicable:  GENERAL: alert, oriented, appears well and in no acute distress  HEENT: atraumatic, conjunttiva clear, no obvious abnormalities on inspection of external nose and ears  NECK: normal movements of the head and neck  LUNGS: on inspection no signs of respiratory distress, breathing rate appears normal, no obvious gross SOB, gasping or wheezing  CV: no obvious cyanosis  MS: moves all visible extremities without noticeable abnormality  PSYCH/NEURO: pleasant and cooperative, no obvious depression or anxiety, speech and thought processing grossly intact  ASSESSMENT AND PLAN:  Discussed the following assessment and plan:  Exposure to Covid-19 Virus - Plan: SAR CoV2 Serology  (COVID 19)AB(IGG)IA, SARS-CoV-2 Antibody, IgM  HM Had flu shot utd  pna 23 utd  prevnar utd  Tdap had 03/24/12  Other vxs UTD except shingrix disc and given Rx prev   Ordered dexa last 05/18/12 osteopeorosis never took any meds sch 01/05/19 Hep C negative in the past per pt husband has hep C  Colonoscopy had 10/02/14 5 polyps repeat in 5 years.   mammo neg 06/27/17 pt to call to schedule sch 01/05/19   S/p hysterctomy age 31 y.o for cycts no h/o abnormal pap per pt    Labs had  08/31/2018 [facility]  -will reorder for future CMET, lipid, A1C f/u h/o iron def anemia chronically  Derm appt Dr. Kellie Moor 08/31/18  Former smoker 40 years ago quit smoked <1 ppd total 6-7 years    [facility] 01/10/19 records left hip OA advanced left knee OA Dr. Raliegh Ip given injection and will f/u PRN  [place] [facility] saw Dr. Arnoldo Morale 03/06/19 deg scoliosis, spondylolisthesis, spinal stenosis with neurogenic claudication doing well 9 months s/p extension of fusion Xrays look good f/u in 6 months  [place] [facility] 09/07/19 >1 year s/p extension of lumbar fusion doing well some back pain with exertion neurogenic claudication resolved on celebrex 200 mg qd and tylenol good relief    I discussed the assessment and treatment plan with the patient. The patient was provided an opportunity to ask questions and all were answered. The patient agreed with the plan and demonstrated an understanding of the instructions.   The patient was advised to call back or seek an in-person evaluation if the symptoms worsen or if the condition fails to improve as anticipated.  Time spent 10 minutes   Delorise Jackson, MD

## 2018-12-13 NOTE — Telephone Encounter (Signed)
Scheuled for 1:30 today.

## 2018-12-21 DIAGNOSIS — Z20828 Contact with and (suspected) exposure to other viral communicable diseases: Secondary | ICD-10-CM | POA: Diagnosis not present

## 2018-12-22 LAB — SARS-COV-2 ANTIBODY, IGM: SARS-CoV-2 Antibody, IgM: NEGATIVE

## 2018-12-22 LAB — SAR COV2 SEROLOGY (COVID19)AB(IGG),IA: SARS-CoV-2 Ab, IgG: NEGATIVE

## 2018-12-22 NOTE — Addendum Note (Signed)
Addended by: Orland Mustard on: 12/22/2018 04:44 PM   Modules accepted: Orders

## 2018-12-27 ENCOUNTER — Encounter: Payer: Self-pay | Admitting: Internal Medicine

## 2018-12-29 ENCOUNTER — Ambulatory Visit: Payer: Self-pay | Admitting: Internal Medicine

## 2018-12-29 NOTE — Telephone Encounter (Signed)
Gave pt. Dr. McLean-Scocuzza's recommendations again. Pt. States she is concerned with swelling, but mainly her shortness of breath. States she will go to ED for evaluation.

## 2018-12-29 NOTE — Telephone Encounter (Signed)
Pt called and does not want to go to ED.  Attempted to contact NT.  Unable to reach.  Sent message

## 2018-12-30 ENCOUNTER — Encounter: Payer: Self-pay | Admitting: Emergency Medicine

## 2018-12-30 ENCOUNTER — Other Ambulatory Visit: Payer: Self-pay

## 2018-12-30 ENCOUNTER — Emergency Department: Payer: Medicare Other

## 2018-12-30 ENCOUNTER — Observation Stay
Admission: EM | Admit: 2018-12-30 | Discharge: 2018-12-31 | Disposition: A | Discharge status: Home / Self Care | Payer: Medicare Other | Attending: Family Medicine | Admitting: Family Medicine

## 2018-12-30 DIAGNOSIS — Z6839 Body mass index (BMI) 39.0-39.9, adult: Secondary | ICD-10-CM | POA: Insufficient documentation

## 2018-12-30 DIAGNOSIS — K219 Gastro-esophageal reflux disease without esophagitis: Secondary | ICD-10-CM | POA: Diagnosis not present

## 2018-12-30 DIAGNOSIS — R079 Chest pain, unspecified: Principal | ICD-10-CM | POA: Insufficient documentation

## 2018-12-30 DIAGNOSIS — Z87891 Personal history of nicotine dependence: Secondary | ICD-10-CM | POA: Diagnosis not present

## 2018-12-30 DIAGNOSIS — Z85820 Personal history of malignant melanoma of skin: Secondary | ICD-10-CM | POA: Diagnosis not present

## 2018-12-30 DIAGNOSIS — I700 Atherosclerosis of aorta: Secondary | ICD-10-CM | POA: Diagnosis not present

## 2018-12-30 DIAGNOSIS — Z79890 Hormone replacement therapy (postmenopausal): Secondary | ICD-10-CM | POA: Insufficient documentation

## 2018-12-30 DIAGNOSIS — E669 Obesity, unspecified: Secondary | ICD-10-CM | POA: Diagnosis not present

## 2018-12-30 DIAGNOSIS — F329 Major depressive disorder, single episode, unspecified: Secondary | ICD-10-CM | POA: Insufficient documentation

## 2018-12-30 DIAGNOSIS — Z791 Long term (current) use of non-steroidal anti-inflammatories (NSAID): Secondary | ICD-10-CM | POA: Insufficient documentation

## 2018-12-30 DIAGNOSIS — M810 Age-related osteoporosis without current pathological fracture: Secondary | ICD-10-CM | POA: Diagnosis not present

## 2018-12-30 DIAGNOSIS — F419 Anxiety disorder, unspecified: Secondary | ICD-10-CM | POA: Diagnosis not present

## 2018-12-30 DIAGNOSIS — R0789 Other chest pain: Secondary | ICD-10-CM | POA: Diagnosis not present

## 2018-12-30 DIAGNOSIS — Z1159 Encounter for screening for other viral diseases: Secondary | ICD-10-CM | POA: Insufficient documentation

## 2018-12-30 DIAGNOSIS — K449 Diaphragmatic hernia without obstruction or gangrene: Secondary | ICD-10-CM | POA: Diagnosis not present

## 2018-12-30 DIAGNOSIS — D649 Anemia, unspecified: Secondary | ICD-10-CM | POA: Diagnosis not present

## 2018-12-30 DIAGNOSIS — M1990 Unspecified osteoarthritis, unspecified site: Secondary | ICD-10-CM | POA: Insufficient documentation

## 2018-12-30 DIAGNOSIS — Z79899 Other long term (current) drug therapy: Secondary | ICD-10-CM | POA: Diagnosis not present

## 2018-12-30 DIAGNOSIS — E039 Hypothyroidism, unspecified: Secondary | ICD-10-CM | POA: Insufficient documentation

## 2018-12-30 DIAGNOSIS — E785 Hyperlipidemia, unspecified: Secondary | ICD-10-CM | POA: Diagnosis not present

## 2018-12-30 DIAGNOSIS — R0602 Shortness of breath: Secondary | ICD-10-CM | POA: Diagnosis not present

## 2018-12-30 DIAGNOSIS — Z20828 Contact with and (suspected) exposure to other viral communicable diseases: Secondary | ICD-10-CM | POA: Diagnosis not present

## 2018-12-30 LAB — CBC WITH DIFFERENTIAL/PLATELET
Abs Immature Granulocytes: 0.02 10*3/uL (ref 0.00–0.07)
Basophils Absolute: 0 10*3/uL (ref 0.0–0.1)
Basophils Relative: 0 %
Eosinophils Absolute: 0.1 10*3/uL (ref 0.0–0.5)
Eosinophils Relative: 2 %
HCT: 20.6 % — ABNORMAL LOW (ref 36.0–46.0)
Hemoglobin: 5.6 g/dL — ABNORMAL LOW (ref 12.0–15.0)
Immature Granulocytes: 0 %
Lymphocytes Relative: 23 %
Lymphs Abs: 1.1 10*3/uL (ref 0.7–4.0)
MCH: 24.5 pg — ABNORMAL LOW (ref 26.0–34.0)
MCHC: 27.2 g/dL — ABNORMAL LOW (ref 30.0–36.0)
MCV: 90 fL (ref 80.0–100.0)
Monocytes Absolute: 0.4 10*3/uL (ref 0.1–1.0)
Monocytes Relative: 7 %
Neutro Abs: 3.4 10*3/uL (ref 1.7–7.7)
Neutrophils Relative %: 68 %
Platelets: 147 10*3/uL — ABNORMAL LOW (ref 150–400)
RBC: 2.29 MIL/uL — ABNORMAL LOW (ref 3.87–5.11)
RDW: 16.6 % — ABNORMAL HIGH (ref 11.5–15.5)
WBC: 5 10*3/uL (ref 4.0–10.5)
nRBC: 0 % (ref 0.0–0.2)

## 2018-12-30 LAB — BASIC METABOLIC PANEL
Anion gap: 6 (ref 5–15)
BUN: 9 mg/dL (ref 8–23)
CO2: 28 mmol/L (ref 22–32)
Calcium: 8.4 mg/dL — ABNORMAL LOW (ref 8.9–10.3)
Chloride: 106 mmol/L (ref 98–111)
Creatinine, Ser: 0.98 mg/dL (ref 0.44–1.00)
GFR calc Af Amer: >60 mL/min (ref >60)
GFR calc non Af Amer: 58 mL/min — ABNORMAL LOW (ref >60)
Glucose, Bld: 109 mg/dL — ABNORMAL HIGH (ref 70–99)
Potassium: 4.2 mmol/L (ref 3.5–5.1)
Sodium: 140 mmol/L (ref 135–145)

## 2018-12-30 LAB — SARS CORONAVIRUS 2 BY RT PCR (HOSPITAL ORDER, PERFORMED IN ~~LOC~~ HOSPITAL LAB): SARS Coronavirus 2: NEGATIVE

## 2018-12-30 LAB — CBC
HCT: 20.5 % — ABNORMAL LOW (ref 36.0–46.0)
Hemoglobin: 5.4 g/dL — ABNORMAL LOW (ref 12.0–15.0)
MCH: 23.7 pg — ABNORMAL LOW (ref 26.0–34.0)
MCHC: 26.3 g/dL — ABNORMAL LOW (ref 30.0–36.0)
MCV: 89.9 fL (ref 80.0–100.0)
Platelets: 161 10*3/uL (ref 150–400)
RBC: 2.28 MIL/uL — ABNORMAL LOW (ref 3.87–5.11)
RDW: 16.7 % — ABNORMAL HIGH (ref 11.5–15.5)
WBC: 5.4 10*3/uL (ref 4.0–10.5)
nRBC: 0 % (ref 0.0–0.2)

## 2018-12-30 LAB — BRAIN NATRIURETIC PEPTIDE: B Natriuretic Peptide: 252 pg/mL — ABNORMAL HIGH (ref 0.0–100.0)

## 2018-12-30 LAB — PROTIME-INR
INR: 1 (ref 0.8–1.2)
Prothrombin Time: 12.6 seconds (ref 11.4–15.2)

## 2018-12-30 LAB — TROPONIN I: Troponin I: <0.03 ng/mL (ref <0.03)

## 2018-12-30 LAB — APTT: aPTT: 29 seconds (ref 24–36)

## 2018-12-30 MED ORDER — OCUVITE-LUTEIN PO CAPS
1.0000 | ORAL_CAPSULE | Freq: Every day | ORAL | Status: DC
  Filled 2018-12-30: qty 1

## 2018-12-30 MED ORDER — FUROSEMIDE 10 MG/ML IJ SOLN: 20.0000 mg | Freq: Once | INTRAMUSCULAR | Status: DC

## 2018-12-30 MED ORDER — PRAVASTATIN SODIUM 20 MG PO TABS
20.0000 mg | ORAL_TABLET | Freq: Every day | ORAL | Status: DC
  Filled 2018-12-30: qty 1

## 2018-12-30 MED ORDER — SODIUM CHLORIDE 0.9% IV SOLUTION
Freq: Once | INTRAVENOUS | Status: AC
  Administered 2018-12-31: 11:00:00 via INTRAVENOUS
  Filled 2018-12-30: qty 250

## 2018-12-30 MED ORDER — SODIUM CHLORIDE 0.9% IV SOLUTION
Freq: Once | INTRAVENOUS | Status: DC
  Filled 2018-12-30: qty 250

## 2018-12-30 MED ORDER — ONDANSETRON HCL 4 MG/2ML IJ SOLN: 4.0000 mg | Freq: Four times a day (QID) | INTRAMUSCULAR | Status: DC | PRN

## 2018-12-30 MED ORDER — ACETAMINOPHEN 650 MG RE SUPP: 650.0000 mg | Freq: Four times a day (QID) | RECTAL | Status: DC | PRN

## 2018-12-30 MED ORDER — ACETAMINOPHEN 325 MG PO TABS: 650.0000 mg | ORAL_TABLET | Freq: Once | ORAL | Status: DC

## 2018-12-30 MED ORDER — SODIUM CHLORIDE 0.9 % IV SOLN
400.0000 mg | Freq: Once | INTRAVENOUS | Status: AC
  Administered 2018-12-30: 400 mg via INTRAVENOUS
  Filled 2018-12-30: qty 20

## 2018-12-30 MED ORDER — CALCIUM CARBONATE-VITAMIN D 500-200 MG-UNIT PO TABS: 1.0000 | ORAL_TABLET | Freq: Every day | ORAL | Status: DC

## 2018-12-30 MED ORDER — ACETAMINOPHEN 325 MG PO TABS
650.0000 mg | ORAL_TABLET | Freq: Four times a day (QID) | ORAL | Status: DC | PRN
  Administered 2018-12-31: 650 mg via ORAL
  Filled 2018-12-30: qty 2

## 2018-12-30 MED ORDER — FERROUS SULFATE 325 (65 FE) MG PO TABS
325.0000 mg | ORAL_TABLET | Freq: Every day | ORAL | Status: DC
  Administered 2018-12-31: 325 mg via ORAL
  Filled 2018-12-30: qty 1

## 2018-12-30 MED ORDER — CITALOPRAM HYDROBROMIDE 20 MG PO TABS
20.0000 mg | ORAL_TABLET | Freq: Every day | ORAL | Status: DC
  Filled 2018-12-30: qty 1

## 2018-12-30 MED ORDER — FUROSEMIDE 10 MG/ML IJ SOLN
20.0000 mg | Freq: Once | INTRAMUSCULAR | Status: DC
  Filled 2018-12-30: qty 4

## 2018-12-30 MED ORDER — LEVOTHYROXINE SODIUM 25 MCG PO TABS
125.0000 ug | ORAL_TABLET | Freq: Every day | ORAL | Status: DC
  Administered 2018-12-31: 125 ug via ORAL
  Filled 2018-12-30: qty 1

## 2018-12-30 MED ORDER — PANTOPRAZOLE SODIUM 40 MG PO TBEC
40.0000 mg | DELAYED_RELEASE_TABLET | Freq: Every day | ORAL | Status: DC
  Filled 2018-12-30: qty 1

## 2018-12-30 MED ORDER — ONDANSETRON HCL 4 MG PO TABS: 4.0000 mg | ORAL_TABLET | Freq: Four times a day (QID) | ORAL | Status: DC | PRN

## 2018-12-30 NOTE — ED Provider Notes (Signed)
[facility] Emergency Department Provider Note  ____________________________________________   I have reviewed the triage vital signs and the nursing notes. Where available I have reviewed prior notes and, if possible and indicated, outside hospital notes.    HISTORY  Chief Complaint Chest Pain    HPI Janet Rice is a 73 y.o. female with a history of chronic anemia, who is had extensive work-up for anemia but no known heart disease.  She states she is had GI work-up endoscopy colonoscopy and no one knows why she is always anemic.  She is scheduled for outpatient iron infusion in about a week however she has been getting very short of breath when she walks around, and also chest discomfort when she walks around.  This is a new symptom for her.  She denies any fever or chills.  She does have chronic leg swelling for which she wears compression stockings.  No chest pain she states only a sensation of pressure when she walks and also feels that she just cannot catch her breath.  She denies any melena or bright red blood per rectum or any other GI or other bleeding.  She declines rectal exam.    Pain when she has a nonradiating pressure  Past Medical History:  Diagnosis Date  . Adenomatous colon polyp   . Arthritis   . Childhood asthma    AS A CHILD  . Chronic heartburn    On omeprazole  . Complication of anesthesia    HARD TO WAKE UP  . Dyspnea    low hgb. and iron  chronic problem  . External hemorrhoids   . Frequency of urination   . Frequent loose stools   . GERD (gastroesophageal reflux disease)   . Heme positive stool   . History of hiatal hernia   . Hyperlipidemia   . Hypothyroidism   . Iron deficiency anemia   . Melanoma ([place])    left thigh s/p LN removal left groin  . Metastatic melanoma ([employer])    Followed by Dr Oliva Bustard, previous chemo, no recurrence, 2008?  . Multinodular goiter   . SCC (squamous cell carcinoma)    skin    Patient  Active Problem List   Diagnosis Date Noted  . Anxiety and depression 08/23/2018  . Gastroesophageal reflux disease 08/23/2018  . Spinal stenosis of lumbar region with neurogenic claudication 07/10/2018  . Arthralgia 07/25/2017  . Myalgia 07/25/2017  . Status post total hip replacement, right 11/18/2016  . Preoperative clearance 08/31/2016  . Hip osteoarthritis 08/31/2016  . Spondylolisthesis of lumbar region 04/08/2016  . Obesity (BMI 30-39.9) 07/18/2014  . DDD (degenerative disc disease), lumbar 12/04/2013  . Spinal stenosis of lumbar region 11/13/2013  . Carpal tunnel syndrome 11/13/2013  . Osteoporosis 08/15/2012  . Hypothyroidism 02/10/2012  . Iron deficiency anemia 08/13/2011  . Hyperlipidemia 08/13/2011  . Hx of adenomatous colonic polyps 06/08/2011    Past Surgical History:  Procedure Laterality Date  . ABDOMINAL HYSTERECTOMY  1984  . BACK SURGERY  03/2016   LUMBAR  . CARPAL TUNNEL RELEASE Right 09/07/2016   Procedure: CARPAL TUNNEL RELEASE;  Surgeon: Thornton Park, MD;  Location: [facility] ORS;  Service: Orthopedics;  Laterality: Right;  . CHOLECYSTECTOMY  1990  . COLONOSCOPY  08/2006   Four sessile polyps found and removed in sigmoid colon at splenic flexure and ascending colon. 7 mm in size. Another removed from transverse colon 4 mm. Path Report - showed tubular adenoma and hyperplastic polyp. Advised to repeat in  3.5 years (02/2010)  . COLONOSCOPY  3.2.2011   8 mm polyp in sigmoid colon, removed, 4 mm polyp in descending colon, removed. Internal hemorrhoids  . ESOPHAGOGASTRODUODENOSCOPY  3.2.2011   Large hiatia hernia, esophagus normal, mulitple small sessile polyps w/no stigmata of recent bleeding found. Mildy erythermatous mucosa w/no bleeding found in gatric antrum. Normal duodenum. 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 PATH - no active inflammation, antral mucosa w/mild foveolar hyperplasia  . FRACTURE SURGERY    . Lymph Node Removal  12/2005   Left  inguinal lymph node dissection  . MELANOMA EXCISION Left 1993   Left thigh  . ORIF ANKLE FRACTURE Right    Dr. Mack Guise  . TOTAL HIP ARTHROPLASTY Right 11/18/2016   Procedure: TOTAL HIP ARTHROPLASTY;  Surgeon: Thornton Park, MD;  Location: [facility] ORS;  Service: Orthopedics;  Laterality: Right;    Prior to Admission medications   Medication Sig Start Date End Date Taking? Authorizing Provider  acetaminophen (TYLENOL 8 HOUR ARTHRITIS PAIN) 650 MG CR tablet Take 1,300 mg by mouth daily.     [provider]  Calcium Carb-Cholecalciferol (CALCIUM 600+D) 600-800 MG-UNIT TABS Take 1 tablet by mouth daily with lunch.    [provider]  celecoxib (CELEBREX) 200 MG capsule Take 200 mg by mouth daily.  08/20/16   [provider]  citalopram (CELEXA) 20 MG tablet Take 1 tablet (20 mg total) by mouth daily. 08/22/18   McLean-Scocuzza, Nino Glow, MD  Ferrous Sulfate Dried (SLOW RELEASE IRON) 45 MG TBCR Take 45 mg by mouth daily with lunch.    [provider]  levothyroxine (SYNTHROID, LEVOTHROID) 125 MCG tablet Take 1 tablet (125 mcg total) by mouth daily before breakfast. 08/22/18   McLean-Scocuzza, Nino Glow, MD  loperamide (IMODIUM) 2 MG capsule Take 2 mg by mouth every morning.    [provider]  Multiple Vitamins-Minerals (EYE VITAMINS & MINERALS) TABS Take 1 tablet by mouth daily.     [provider]  NON FORMULARY Apply 1 application topically 2 (two) times daily as needed (muscle pain). Horse Liniment    [provider]  omeprazole (PRILOSEC) 20 MG capsule Take 1 capsule (20 mg total) by mouth daily. 08/22/18   McLean-Scocuzza, Nino Glow, MD  pravastatin (PRAVACHOL) 40 MG tablet Take 0.5 tablets (20 mg total) by mouth daily. 08/22/18   McLean-Scocuzza, Nino Glow, MD  Propylene Glycol (SYSTANE BALANCE OP) Place 1 drop into both eyes daily as needed (dry eyes).    [provider]  TURMERIC PO Take 1 capsule by mouth daily at 12 noon.     [provider]    Allergies Sulfa antibiotics  Family History  Problem Relation Age of Onset  . Aneurysm Mother   . Heart disease Mother   . Colon polyps Father   . Diabetes Father   . Dementia Father   . Stroke Father        TIA  . Leukemia Maternal Grandfather   . Skin cancer Paternal Grandfather   . Diabetes Other   . Colon polyps Other   . Colon cancer Neg Hx   . Breast cancer Neg Hx     Social History Social History   Tobacco Use  . Smoking status: Former Smoker    Packs/day: 0.50    Years: 8.00    Pack years: 4.00    Types: Cigarettes    Quit date: 07/12/1978    Years since quitting: 40.4  . Smokeless tobacco: Never Used  Substance Use Topics  . Alcohol use: No    Alcohol/week: 0.0 standard drinks  . Drug use: No    Review of Systems Constitutional: No fever/chills Eyes: No visual changes. ENT: No sore throat. No stiff neck no neck pain Cardiovascular: Denies chest pain. Respiratory: Denies shortness of breath. Gastrointestinal:   no vomiting.  No diarrhea.  No constipation. Genitourinary: Negative for dysuria. Musculoskeletal: Negative lower extremity swelling Skin: Negative for rash. Neurological: Negative for severe headaches, focal weakness or numbness.   ____________________________________________   PHYSICAL EXAM:  VITAL SIGNS: ED Triage Vitals  Enc Vitals Group     BP 12/30/18 1116 (!) 122/48     Pulse Rate 12/30/18 1116 70     Resp 12/30/18 1116 16     Temp 12/30/18 1116 98.9 F (37.2 C)     Temp Source 12/30/18 1116 Oral     SpO2 12/30/18 1116 99 %     Weight 12/30/18 1117 208 lb (94.3 kg)     Height 12/30/18 1117 5\' 2"  (1.575 m)     Head Circumference --      Peak Flow --      Pain Score 12/30/18 1117 6     Pain Loc --      Pain Edu? --      Excl. in [place]? --     Constitutional: Alert and oriented. Well appearing and in no acute distress. Eyes: Conjunctivae are pale Head: Atraumatic HEENT: No  congestion/rhinnorhea. Mucous membranes are moist.  Oropharynx non-erythematous Neck:   Nontender with no meningismus, no masses, no stridor Cardiovascular: Normal rate, regular rhythm. Grossly normal heart sounds.  Good peripheral circulation. Respiratory: Normal respiratory effort.  No retractions. Lungs CTAB. Abdominal: Soft and nontender. No distention. No guarding no rebound Back:  There is no focal tenderness or step off.  there is no midline tenderness there are no lesions noted. there is no CVA tenderness Musculoskeletal: No lower extremity tenderness, no upper extremity tenderness. No joint effusions, no DVT signs strong distal pulses positive bilateral lower extremity edema Neurologic:  Normal speech and language. No gross focal neurologic deficits are appreciated.  Skin:  Skin is warm, dry and intact. No rash noted. Psychiatric: Mood and affect are normal. Speech and behavior are normal.  ____________________________________________   LABS (all labs ordered are listed, but only abnormal results are displayed)  Labs Reviewed  BASIC METABOLIC PANEL - Abnormal; Notable for the following components:      Result Value   Glucose, Bld 109 (*)    Calcium 8.4 (*)    GFR calc non Af Amer 58 (*)    All other components within normal limits  CBC - Abnormal; Notable for the following components:   RBC 2.28 (*)    Hemoglobin 5.4 (*)    HCT 20.5 (*)    MCH 23.7 (*)    MCHC 26.3 (*)    RDW 16.7 (*)    All other components within normal limits  BRAIN NATRIURETIC PEPTIDE - Abnormal; Notable for the following components:   B Natriuretic Peptide 252.0 (*)    All other components within normal limits  SARS CORONAVIRUS 2 (HOSPITAL ORDER, [place] [facility])  TROPONIN I  PROTIME-INR  APTT  DIFFERENTIAL  TYPE AND SCREEN  PREPARE RBC (CROSSMATCH)    Pertinent labs  results that were available during my care of the patient were reviewed by me and considered in my  medical decision making (see chart for details). ____________________________________________  EKG  I personally interpreted any EKGs ordered by me or triage Sinus rate 68 no acute ST elevation depression normal axis unremarkable EKG ____________________________________________  RADIOLOGY  Pertinent labs & imaging results that were available during my care of the patient were reviewed by me and considered in my medical decision making (see chart for details). If possible, patient and/or family made aware of any abnormal findings.  Dg Chest 2 View  Result Date: 12/30/2018 CLINICAL DATA:  Abdominal pain. Bilateral lower extremity edema. Shortness of breath. EXAM: CHEST - 2 VIEW COMPARISON:  07/15/2018 FINDINGS: Cardiomediastinal silhouette is mildly enlarged. Calcific atherosclerotic disease of the aorta. Hiatal hernia. There is no evidence of focal airspace consolidation, pleural effusion or pneumothorax. Osseous structures are without acute abnormality. Soft tissues are grossly normal. IMPRESSION: 1. Mildly enlarged cardiac silhouette. 2. Calcific atherosclerotic disease of the aorta. 3. Hiatal hernia. Electronically Signed   By: Fidela Salisbury M.D.   On: 12/30/2018 14:51   ____________________________________________    PROCEDURES  Procedure(s) performed: None  Procedures  Critical Care performed: CRITICAL CARE Performed by: Schuyler Amor   Total critical care time: 38 minutes  Critical care time was exclusive of separately billable procedures and treating other patients.  Critical care was necessary to treat or prevent imminent or life-threatening deterioration.  Critical care was time spent personally by me on the following activities: development of treatment plan with patient and/or surrogate as well as nursing, discussions with consultants, evaluation of patient's response to treatment, examination of patient, obtaining history from patient or surrogate, ordering and  performing treatments and interventions, ordering and review of laboratory studies, ordering and review of radiographic studies, pulse oximetry and re-evaluation of patient's condition.   ____________________________________________   INITIAL IMPRESSION / ASSESSMENT AND PLAN / ED COURSE  Pertinent labs & imaging results that were available during my care of the patient were reviewed by me and considered in my medical decision making (see chart for details).  Here with exertional shortness of breath and chest discomfort likely secondary to significant anemia.  She denies any rectal bleeding and refuses rectal exam saying that she has had these symptoms for years and has never had any GI bleeding to account for it.  It is a "mystery" she states while she is always anemic but she does get iron infusions for this very reason.  Given her significant symptomology with this anemia, we will transfuse her and I think she would safely be admitted rather than discharge.    ____________________________________________   FINAL CLINICAL IMPRESSION(S) / ED DIAGNOSES  Final diagnoses:  None      This chart was dictated using voice recognition software.  Despite best efforts to proofread,  errors can occur which can change meaning.      Schuyler Amor, MD 12/30/18 [phone]

## 2018-12-30 NOTE — ED Notes (Addendum)
ED TO INPATIENT HANDOFF REPORT  ED Nurse Name and Phone #:  Gershon Mussel RN  [phone]  S Name/Age/Gender Janet Rice 73 y.o. female Room/Bed: ED19A/ED19A  Code Status   Code Status: DNR  Home/SNF/Other Home Patient oriented to: self, place, time and situation Is this baseline? Yes   Triage Complete: Triage complete  Chief Complaint chest tightness  Triage Note Pt to ED from home c/o chest tightness for several weeks but progressively getting worse.  States tightness wraps around chest, some right arm pain, noticed swelling in bilateral lower legs and abd, non pitting.  States SOB worse with exertion, denies cough or fevers, or n/v/d.  Pt presents A&Ox4, speaking in complete and coherent sentences, chest rise even and unlabored, in NAD at this time.   Allergies Allergies  Allergen Reactions  . Sulfa Antibiotics Rash    Level of Care/Admitting Diagnosis ED Disposition    ED Disposition Condition [place]: [place] [[id number]]  Level of Care: Med-Surg [16]  Covid Evaluation: Person Under Investigation (PUI)  Isolation Risk Level: Low Risk/Droplet (Less than 4L [place] supplementation)  Diagnosis: Anemia [427062]  Admitting Physician: Loletha Grayer [[id number]]  Attending Physician: Loletha Grayer [phone]  PT Class (Do Not Modify): Observation [104]  PT Acc Code (Do Not Modify): Observation [10022]       B Medical/Surgery History Past Medical History:  Diagnosis Date  . Adenomatous colon polyp   . Arthritis   . Childhood asthma    AS A CHILD  . Chronic heartburn    On omeprazole  . Complication of anesthesia    HARD TO WAKE UP  . Dyspnea    low hgb. and iron  chronic problem  . External hemorrhoids   . Frequency of urination   . Frequent loose stools   . GERD (gastroesophageal reflux disease)   . Heme positive stool   . History of hiatal hernia   . Hyperlipidemia   . Hypothyroidism   . Iron deficiency anemia   .  Melanoma ([place])    left thigh s/p LN removal left groin  . Metastatic melanoma ([place])    Followed by Dr Oliva Bustard, previous chemo, no recurrence, 2008?  . Multinodular goiter   . SCC (squamous cell carcinoma)    skin   Past Surgical History:  Procedure Laterality Date  . ABDOMINAL HYSTERECTOMY  1984  . BACK SURGERY  03/2016   LUMBAR  . CARPAL TUNNEL RELEASE Right 09/07/2016   Procedure: CARPAL TUNNEL RELEASE;  Surgeon: Thornton Park, MD;  Location: [facility] ORS;  Service: Orthopedics;  Laterality: Right;  . CHOLECYSTECTOMY  1990  . COLONOSCOPY  08/2006   Four sessile polyps found and removed in sigmoid colon at splenic flexure and ascending colon. 7 mm in size. Another removed from transverse colon 4 mm. Path Report - showed tubular adenoma and hyperplastic polyp. Advised to repeat in 3.5 years (02/2010)  . COLONOSCOPY  3.2.2011   8 mm polyp in sigmoid colon, removed, 4 mm polyp in descending colon, removed. Internal hemorrhoids  . ESOPHAGOGASTRODUODENOSCOPY  3.2.2011   Large hiatia hernia, esophagus normal, mulitple small sessile polyps w/no stigmata of recent bleeding found. Mildy erythermatous mucosa w/no bleeding found in gatric antrum. Normal duodenum. 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 PATH - no active inflammation, antral mucosa w/mild foveolar hyperplasia  . FRACTURE SURGERY    . Lymph Node Removal  12/2005   Left inguinal lymph node dissection  . MELANOMA EXCISION Left  1993   Left thigh  . ORIF ANKLE FRACTURE Right    Dr. Mack Guise  . TOTAL HIP ARTHROPLASTY Right 11/18/2016   Procedure: TOTAL HIP ARTHROPLASTY;  Surgeon: Thornton Park, MD;  Location: [facility] ORS;  Service: Orthopedics;  Laterality: Right;     A IV Location/Drains/Wounds Patient Lines/Drains/Airways Status   Active Line/Drains/Airways    Name:   Placement date:   Placement time:   Site:   Days:   Peripheral IV 09/19/18 Left Arm   09/19/18    1355    Arm   102   Peripheral IV 12/30/18 Left  Antecubital   12/30/18    1751    Antecubital   less than 1   Incision (Closed) 07/10/18 Back   07/10/18    1435     173          Intake/Output Last 24 hours No intake or output data in the 24 hours ending 12/30/18 1954  Labs/Imaging Results for orders placed or performed during the hospital encounter of 12/30/18 (from the past 48 hour(s))  Basic metabolic panel     Status: Abnormal   Collection Time: 12/30/18 11:25 AM  Result Value Ref Range   Sodium 140 135 - 145 mmol/L   Potassium 4.2 3.5 - 5.1 mmol/L   Chloride 106 98 - 111 mmol/L   CO2 28 22 - 32 mmol/L   Glucose, Bld 109 (H) 70 - 99 mg/dL   BUN 9 8 - 23 mg/dL   Creatinine, Ser 0.98 0.44 - 1.00 mg/dL   Calcium 8.4 (L) 8.9 - 10.3 mg/dL   GFR calc non Af Amer 58 (L) >60 mL/min   GFR calc Af Amer >60 >60 mL/min   Anion gap 6 5 - 15    Comment: Performed at [facility], [address] [place], [place] [address]  CBC     Status: Abnormal   Collection Time: 12/30/18 11:25 AM  Result Value Ref Range   WBC 5.4 4.0 - 10.5 K/uL   RBC 2.28 (L) 3.87 - 5.11 MIL/uL   Hemoglobin 5.4 (L) 12.0 - 15.0 g/dL   HCT 20.5 (L) 36.0 - 46.0 %   MCV 89.9 80.0 - 100.0 fL   MCH 23.7 (L) 26.0 - 34.0 pg   MCHC 26.3 (L) 30.0 - 36.0 g/dL   RDW 16.7 (H) 11.5 - 15.5 %   Platelets 161 150 - 400 K/uL   nRBC 0.0 0.0 - 0.2 %    Comment: Performed at [facility], [address] [place], [place] [address]  Troponin I - ONCE - STAT     Status: None   Collection Time: 12/30/18 11:25 AM  Result Value Ref Range   Troponin I <0.03 <0.03 ng/mL    Comment: Performed at [facility], [address] [place], [place] [address]  CBC with Differential/Platelet     Status: Abnormal   Collection Time: 12/30/18 11:25 AM  Result Value Ref Range   WBC 5.0 4.0 - 10.5 K/uL   RBC 2.29 (L) 3.87 - 5.11 MIL/uL   Hemoglobin 5.6 (L) 12.0 - 15.0 g/dL   HCT 20.6 (L) 36.0 - 46.0 %   MCV 90.0 80.0 - 100.0 fL   MCH 24.5 (L) 26.0 - 34.0 pg    MCHC 27.2 (L) 30.0 - 36.0 g/dL   RDW 16.6 (H) 11.5 - 15.5 %   Platelets 147 (L) 150 - 400 K/uL   nRBC 0.0 0.0 - 0.2 %   Neutrophils  Relative % 68 %   Neutro Abs 3.4 1.7 - 7.7 K/uL   Lymphocytes Relative 23 %   Lymphs Abs 1.1 0.7 - 4.0 K/uL   Monocytes Relative 7 %   Monocytes Absolute 0.4 0.1 - 1.0 K/uL   Eosinophils Relative 2 %   Eosinophils Absolute 0.1 0.0 - 0.5 K/uL   Basophils Relative 0 %   Basophils Absolute 0.0 0.0 - 0.1 K/uL   Immature Granulocytes 0 %   Abs Immature Granulocytes 0.02 0.00 - 0.07 K/uL    Comment: Performed at [facility], [address] [place], [place] [address]  Brain natriuretic peptide     Status: Abnormal   Collection Time: 12/30/18  3:25 PM  Result Value Ref Range   B Natriuretic Peptide 252.0 (H) 0.0 - 100.0 pg/mL    Comment: Performed at [facility], [address] [place], [place] [address]  SARS Coronavirus 2 (CEPHEID - Performed in [place] hospital lab), Hosp Order     Status: None   Collection Time: 12/30/18  4:20 PM   Specimen: Nasopharyngeal Swab  Result Value Ref Range   SARS Coronavirus 2 NEGATIVE NEGATIVE    Comment: (NOTE) If result is NEGATIVE SARS-CoV-2 target nucleic acids are NOT DETECTED. The SARS-CoV-2 RNA is generally detectable in upper and lower  respiratory specimens during the acute phase of infection. The lowest  concentration of SARS-CoV-2 viral copies this assay can detect is 250  copies / mL. A negative result does not preclude SARS-CoV-2 infection  and should not be used as the sole basis for treatment or other  patient management decisions.  A negative result may occur with  improper specimen collection / handling, submission of specimen other  than nasopharyngeal swab, presence of viral mutation(s) within the  areas targeted by this assay, and inadequate number of viral copies  (<250 copies / mL). A negative result must be combined with clinical  observations, patient history, and  epidemiological information. If result is POSITIVE SARS-CoV-2 target nucleic acids are DETECTED. The SARS-CoV-2 RNA is generally detectable in upper and lower  respiratory specimens dur ing the acute phase of infection.  Positive  results are indicative of active infection with SARS-CoV-2.  Clinical  correlation with patient history and other diagnostic information is  necessary to determine patient infection status.  Positive results do  not rule out bacterial infection or co-infection with other viruses. If result is PRESUMPTIVE POSTIVE SARS-CoV-2 nucleic acids MAY BE PRESENT.   A presumptive positive result was obtained on the submitted specimen  and confirmed on repeat testing.  While 2019 novel coronavirus  (SARS-CoV-2) nucleic acids may be present in the submitted sample  additional confirmatory testing may be necessary for epidemiological  and / or clinical management purposes  to differentiate between  SARS-CoV-2 and other Sarbecovirus currently known to infect humans.  If clinically indicated additional testing with an alternate test  methodology [phone]) is advised. The SARS-CoV-2 RNA is generally  detectable in upper and lower respiratory sp ecimens during the acute  phase of infection. The expected result is Negative. Fact Sheet for Patients:  StrictlyIdeas.no Fact Sheet for Healthcare Providers: BankingDealers.co.za This test is not yet approved or cleared by the [place] FDA and has been authorized for detection and/or diagnosis of SARS-CoV-2 by FDA under an Emergency Use Authorization (EUA).  This EUA will remain in effect (meaning this test can be used) for the duration of the COVID-19 declaration under Section 564(b)(1) of the Act, 21  U.S.C. section 360bbb-3(b)(1), unless the authorization is terminated or revoked sooner. Performed at [facility], [address] [place], [place] [address]    Protime-INR     Status: None   Collection Time: 12/30/18  4:20 PM  Result Value Ref Range   Prothrombin Time 12.6 11.4 - 15.2 seconds   INR 1.0 0.8 - 1.2    Comment: (NOTE) INR goal varies based on device and disease states. Performed at [facility], [address] [place], [place] [address]   APTT     Status: None   Collection Time: 12/30/18  4:20 PM  Result Value Ref Range   aPTT 29 24 - 36 seconds    Comment: Performed at [facility], [address] [place], [place] [address]  Type and screen [place]     Status: None (Preliminary result)   Collection Time: 12/30/18  4:20 PM  Result Value Ref Range   ABO/RH(D) AB NEG    Antibody Screen NEG    Sample Expiration 01/02/2019,2359    Unit Number [id number]    Blood Component Type RBC, LR IRR    Unit division 00    Status of Unit ISSUED    Transfusion Status OK TO TRANSFUSE    Crossmatch Result      Compatible Performed at [facility], [address] [place], [place] [address]    Unit Number [id number]    Blood Component Type RED CELLS,LR    Unit division 00    Status of Unit ALLOCATED    Transfusion Status OK TO TRANSFUSE    Crossmatch Result Compatible   Prepare RBC     Status: None   Collection Time: 12/30/18  4:20 PM  Result Value Ref Range   Order Confirmation      ORDER PROCESSED BY BLOOD BANK Performed at [facility], [address], [place], [place] [address]    Dg Chest 2 View  Result Date: 12/30/2018 CLINICAL DATA:  Abdominal pain. Bilateral lower extremity edema. Shortness of breath. EXAM: CHEST - 2 VIEW COMPARISON:  07/15/2018 FINDINGS: Cardiomediastinal silhouette is mildly enlarged. Calcific atherosclerotic disease of the aorta. Hiatal hernia. There is no evidence of focal airspace consolidation, pleural effusion or pneumothorax. Osseous structures are without acute abnormality. Soft tissues are grossly normal. IMPRESSION: 1. Mildly  enlarged cardiac silhouette. 2. Calcific atherosclerotic disease of the aorta. 3. Hiatal hernia. Electronically Signed   By: Fidela Salisbury M.D.   On: 12/30/2018 14:51    Pending Labs Unresulted Labs (From admission, onward)    Start     Ordered   12/31/18 2876  Basic metabolic panel  Tomorrow morning,   STAT     12/30/18 1757   12/31/18 0500  CBC  Tomorrow morning,   STAT     12/30/18 1757   12/31/18 0500  TSH  Tomorrow morning,   STAT     12/30/18 1810   12/30/18 1755  Occult blood card to lab, stool RN will collect  Once,   STAT    Question:  Specimen to be collected by?  Answer:  RN will collect   12/30/18 1754          Vitals/Pain Today's Vitals   12/30/18 1900 12/30/18 1909 12/30/18 1915 12/30/18 1930  BP: (!) 112/55 (!) 116/51  (!) 116/46  Pulse: 71 77 79 81  Resp: 18 18 19  (!) 23  Temp:  97.9 F (36.6 C)    TempSrc:  Oral  SpO2: 98% 99% 97% 97%  Weight:      Height:      PainSc:        Isolation Precautions No active isolations  Medications Medications  0.9 %  sodium chloride infusion (Manually program via Guardrails IV Fluids) (has no administration in time range)  acetaminophen (TYLENOL) tablet 650 mg (has no administration in time range)    Or  acetaminophen (TYLENOL) suppository 650 mg (has no administration in time range)  ondansetron (ZOFRAN) tablet 4 mg (has no administration in time range)    Or  ondansetron (ZOFRAN) injection 4 mg (has no administration in time range)  pravastatin (PRAVACHOL) tablet 20 mg (has no administration in time range)  citalopram (CELEXA) tablet 20 mg (has no administration in time range)  levothyroxine (SYNTHROID) tablet 125 mcg (has no administration in time range)  pantoprazole (PROTONIX) EC tablet 40 mg (has no administration in time range)  Ferrous Sulfate Dried TBCR 45 mg (has no administration in time range)  Calcium Carb-Cholecalciferol 600-800 MG-UNIT TABS 1 tablet (has no administration in time range)  Eye  Vitamins & Minerals TABS 1 tablet (has no administration in time range)  iron sucrose (VENOFER) 400 mg in sodium chloride 0.9 % 250 mL IVPB (has no administration in time range)  0.9 %  sodium chloride infusion (Manually program via Guardrails IV Fluids) (has no administration in time range)  furosemide (LASIX) injection 20 mg (has no administration in time range)  acetaminophen (TYLENOL) tablet 650 mg (has no administration in time range)  furosemide (LASIX) injection 20 mg (has no administration in time range)    Mobility walks Low fall risk   Focused Assessments Cardiac Assessment Handoff:  Cardiac Rhythm: Normal sinus rhythm Lab Results  Component Value Date   CKTOTAL 74 07/27/2017   TROPONINI <0.03 12/30/2018   No results found for: DDIMER Does the Patient currently have chest pain? No     R Recommendations: See Admitting Provider Note  Report given to:  April RN on 1A  Additional Notes:

## 2018-12-30 NOTE — Progress Notes (Signed)
[place]  [phone]  [phone]     Janet Rice DOB: [birth year]  MR#: [id number]  VXB#:[id number]  Patient Care Team: McLean-Scocuzza, Nino Glow, MD as PCP - General (Internal Medicine)  CHIEF COMPLAINT: Iron deficiency anemia.  INTERVAL HISTORY: Patient returns to clinic today for repeat laboratory work, further evaluation, and continuation of IV Feraheme.  She was recently seen in the emergency room and was given 1 unit of packed red blood cells for her decreased hemoglobin.  She continues to feel weak and fatigue, but otherwise feels well.  She has no neurologic complaints. She denies any recent fevers or illnesses. She has a good appetite and denies weight loss.  She denies any chest pain, shortness of breath, cough, or hemoptysis.  She denies any nausea, vomiting, constipation, or diarrhea. She has no melena or hematochezia. She has no urinary complaints.  Patient offers no further specific complaints today.    REVIEW OF SYSTEMS:   Review of Systems  Constitutional: Positive for malaise/fatigue. Negative for fever and weight loss.  Eyes: Negative.   Respiratory: Negative.  Negative for cough and shortness of breath.   Cardiovascular: Negative.  Negative for chest pain and leg swelling.  Gastrointestinal: Negative.  Negative for abdominal pain, blood in stool and melena.  Genitourinary: Negative.  Negative for hematuria.  Musculoskeletal: Negative.  Negative for back pain and joint pain.  Skin: Negative.  Negative for rash.  Neurological: Positive for weakness. Negative for dizziness, sensory change and focal weakness.  Psychiatric/Behavioral: Negative.  The patient is not nervous/anxious.     As per HPI. Otherwise, a complete review of systems is negative.  ONCOLOGY HISTORY: Oncology History   No history exists.    PAST MEDICAL HISTORY: Past Medical History:  Diagnosis Date  . Adenomatous colon polyp   . Arthritis   . Childhood  asthma    AS A CHILD  . Chronic heartburn    On omeprazole  . Complication of anesthesia    HARD TO WAKE UP  . Dyspnea    low hgb. and iron  chronic problem  . External hemorrhoids   . Frequency of urination   . Frequent loose stools   . GERD (gastroesophageal reflux disease)   . Heme positive stool   . History of hiatal hernia   . Hyperlipidemia   . Hypothyroidism   . Iron deficiency anemia   . Melanoma ([place])    left thigh s/p LN removal left groin  . Metastatic melanoma ([place])    Followed by Dr Oliva Bustard, previous chemo, no recurrence, 2008?  . Multinodular goiter   . SCC (squamous cell carcinoma)    skin    PAST SURGICAL HISTORY: Past Surgical History:  Procedure Laterality Date  . ABDOMINAL HYSTERECTOMY  1984  . BACK SURGERY  03/2016   LUMBAR  . CARPAL TUNNEL RELEASE Right 09/07/2016   Procedure: CARPAL TUNNEL RELEASE;  Surgeon: Thornton Park, MD;  Location: [facility] ORS;  Service: Orthopedics;  Laterality: Right;  . CHOLECYSTECTOMY  1990  . COLONOSCOPY  08/2006   Four sessile polyps found and removed in sigmoid colon at splenic flexure and ascending colon. 7 mm in size. Another removed from transverse colon 4 mm. Path Report - showed tubular adenoma and hyperplastic polyp. Advised to repeat in 3.5 years (02/2010)  . COLONOSCOPY  3.2.2011   8 mm polyp in sigmoid colon, removed, 4 mm polyp in descending colon, removed. Internal hemorrhoids  . ESOPHAGOGASTRODUODENOSCOPY  3.2.2011   Large hiatia  hernia, esophagus normal, mulitple small sessile polyps w/no stigmata of recent bleeding found. Mildy erythermatous mucosa w/no bleeding found in gatric antrum. Normal duodenum. 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 PATH - no active inflammation, antral mucosa w/mild foveolar hyperplasia  . FRACTURE SURGERY    . Lymph Node Removal  12/2005   Left inguinal lymph node dissection  . MELANOMA EXCISION Left 1993   Left thigh  . ORIF ANKLE FRACTURE Right    Dr. Mack Guise  .  TOTAL HIP ARTHROPLASTY Right 11/18/2016   Procedure: TOTAL HIP ARTHROPLASTY;  Surgeon: Thornton Park, MD;  Location: [facility] ORS;  Service: Orthopedics;  Laterality: Right;    FAMILY HISTORY Family History  Problem Relation Age of Onset  . Aneurysm Mother   . Heart disease Mother   . Colon polyps Father   . Diabetes Father   . Dementia Father   . Stroke Father        TIA  . Leukemia Maternal Grandfather   . Skin cancer Paternal Grandfather   . Diabetes Other   . Colon polyps Other   . Colon cancer Neg Hx   . Breast cancer Neg Hx     GYNECOLOGIC HISTORY:  No LMP recorded. Patient has had a hysterectomy.     ADVANCED DIRECTIVES:    HEALTH MAINTENANCE: Social History   Tobacco Use  . Smoking status: Former Smoker    Packs/day: 0.50    Years: 8.00    Pack years: 4.00    Types: Cigarettes    Quit date: 07/12/1978    Years since quitting: 40.5  . Smokeless tobacco: Never Used  Substance Use Topics  . Alcohol use: No    Alcohol/week: 0.0 standard drinks  . Drug use: No    Allergies  Allergen Reactions  . Sulfa Antibiotics Rash    Current Outpatient Medications  Medication Sig Dispense Refill  . acetaminophen (TYLENOL 8 HOUR ARTHRITIS PAIN) 650 MG CR tablet Take 1,300 mg by mouth daily.     . Calcium Carb-Cholecalciferol (CALCIUM 600+D) 600-800 MG-UNIT TABS Take 1 tablet by mouth daily with lunch.    . citalopram (CELEXA) 20 MG tablet Take 1 tablet (20 mg total) by mouth daily. 90 tablet 3  . Ferrous Sulfate Dried (SLOW RELEASE IRON) 45 MG TBCR Take 45 mg by mouth daily with lunch.    . levothyroxine (SYNTHROID, LEVOTHROID) 125 MCG tablet Take 1 tablet (125 mcg total) by mouth daily before breakfast. 90 tablet 3  . loperamide (IMODIUM) 2 MG capsule Take 2 mg by mouth every morning.    . Multiple Vitamins-Minerals (EYE VITAMINS & MINERALS) TABS Take 1 tablet by mouth daily.     . NON FORMULARY Apply 1 application topically 2 (two) times daily as needed (muscle pain).  Horse Liniment    . pravastatin (PRAVACHOL) 40 MG tablet Take 0.5 tablets (20 mg total) by mouth daily. 45 tablet 3  . Propylene Glycol (SYSTANE BALANCE OP) Place 1 drop into both eyes daily as needed (dry eyes).    . TURMERIC PO Take 1 capsule by mouth daily at 12 noon.     No current facility-administered medications for this visit.     OBJECTIVE: BP 120/65 (BP Location: Left Arm, Patient Position: Sitting)   Pulse 73   Temp (!) 96.4 F (35.8 C) (Tympanic)   Ht 5\' 2"  (1.575 m)   Wt 228 lb (103.4 kg)   BMI 41.70 kg/m    Body mass index is 41.7 kg/m.  ECOG FS:0 - Asymptomatic  General: Well-developed, well-nourished, no acute distress. Eyes: Pink conjunctiva, anicteric sclera. HEENT: Normocephalic, moist mucous membranes. Lungs: Clear to auscultation bilaterally. Heart: Regular rate and rhythm. No rubs, murmurs, or gallops. Abdomen: Soft, nontender, nondistended. No organomegaly noted, normoactive bowel sounds. Musculoskeletal: No edema, cyanosis, or clubbing. Neuro: Alert, answering all questions appropriately. Cranial nerves grossly intact. Skin: No rashes or petechiae noted. Psych: Normal affect.   LAB RESULTS:  December 27, 2016: WBC 6.6, hemoglobin 10.4, platelet 144. Total iron 20, iron saturation 7%, ferritin 55. April 27, 2017: WBC 6.8, hemoglobin 11.3, platelets 151. Total iron 26, iron saturation 8%, ferritin 24. September 23, 2017: WBC 7.5, hemoglobin 10.9, platelet 165.  Total iron 18, iron saturation 5%, ferritin 21. December 22, 2017: WBC 6.9, hemoglobin 10.3, platelets 159.  Total iron 22, iron saturation 7%, ferritin 22. April 11, 2018: WBC 8.1, hemoglobin 11.5, platelets 190.  Total iron 25, iron saturation 7%, ferrin 24. September 14, 2018: WBC 8.1, hemoglobin 10.2, platelets 192.  Total iron 20, iron saturation 7%, ferritin 32.  CBC    Component Value Date/Time   WBC 6.4 12/31/2018 0611   RBC 2.89 (L) 12/31/2018 0611   HGB 8.8 (L) 12/31/2018 1535   HGB 7.5 (L)  05/29/2018 1410   HCT 29.9 (L) 12/31/2018 1535   HCT 24.2 (L) 05/29/2018 1410   PLT 151 12/31/2018 0611   PLT 170 05/29/2018 1410   MCV 86.9 12/31/2018 0611   MCV 80 05/29/2018 1410   MCV 74 (L) 12/12/2013 1015   MCH 25.3 (L) 12/31/2018 0611   MCHC 29.1 (L) 12/31/2018 0611   RDW 15.7 (H) 12/31/2018 0611   RDW 17.1 (H) 05/29/2018 1410   RDW 33.6 (H) 12/12/2013 1015   LYMPHSABS 1.1 12/30/2018 1125   LYMPHSABS 1.5 05/29/2018 1410   LYMPHSABS 1.2 12/12/2013 1015   MONOABS 0.4 12/30/2018 1125   MONOABS 0.4 12/12/2013 1015   EOSABS 0.1 12/30/2018 1125   EOSABS 0.1 05/29/2018 1410   EOSABS 0.1 12/12/2013 1015   BASOSABS 0.0 12/30/2018 1125   BASOSABS 0.0 05/29/2018 1410   BASOSABS 0.1 12/12/2013 1015   Lab Results  Component Value Date   IRON 25 (L) 08/31/2018   TIBC 280 08/31/2018   IRONPCTSAT 9 (LL) 08/31/2018   Lab Results  Component Value Date   FERRITIN 43 08/31/2018      STUDIES: Dg Bone Density  Result Date: 01/05/2019 EXAM: DUAL X-RAY ABSORPTIOMETRY (DXA) FOR BONE MINERAL DENSITY IMPRESSION: Technologist: MTB Your patient Alyanna Stoermer completed a BMD test on 01/05/2019 using the [place] (analysis version: 14.10) manufactured by [employer]. The following summarizes the results of our evaluation. PATIENT BIOGRAPHICAL: Name: Kamali, Sakata Patient ID: [id number] Birth Date: [birth year] Height: 62.0 in. Gender: Female Exam Date: 01/05/2019 Weight: 208.0 lbs. Indications: Caucasian, History of Spinal Surgery, Hypothyroid, Postmenopausal, right hip replacement, History of Fracture (Adult) Fractures: Ankle Treatments: Calcium, LEVOTHYROXINE, Synthroid, Vitamin D ASSESSMENT: The BMD measured at AP Spine L1-L2 is 0.867 g/cm2 with a T-score of -2.5. This patient is considered osteoporotic according to [facility]) criteria. The quality of the scan is good. L-3 and L-4 were excluded due to surgical hardware. Right Femur was excluded due to  surgical hardware. Site Region Measured Measured WHO Young Adult BMD Date       Age      Classification T-score AP Spine L1-L2 01/05/2019 72.7 Osteoporosis -2.5 0.867 g/cm2 AP Spine L1-L2 05/18/2012 66.1 Osteoporosis -3.1 0.795 g/cm2 AP Spine  L1-L2 05/29/2009 63.1 Osteopenia -2.1 0.920 g/cm2 Left Femur Upper Neck 01/05/2019 72.7 N/A -1.5 0.647 g/cm2 Left Femur Upper Neck 05/18/2012 66.1 N/A -1.0 0.700 g/cm2 Left Femur Upper Neck 05/29/2009 63.1 N/A -0.4 0.769 g/cm2 Left Femur Total 01/05/2019 72.7 Normal -0.9 0.899 g/cm2 Left Femur Total 05/18/2012 66.1 Normal -0.6 0.932 g/cm2 Left Femur Total 05/29/2009 63.1 Normal -0.4 0.956 g/cm2 Left Forearm Radius 33% 01/05/2019 72.7 Osteopenia -1.8 0.716 g/cm2 Left Forearm Radius 33% 05/18/2012 66.1 Osteoporosis -2.7 0.638 g/cm2 Left Forearm Radius 33% 05/29/2009 63.1 Osteopenia -1.1 0.778 g/cm2 World Health Organization [facility]) criteria for post-menopausal, Caucasian Women: Normal:       T-score at or above -1 SD Osteopenia:   T-score between -1 and -2.5 SD Osteoporosis: T-score at or below -2.5 SD RECOMMENDATIONS: 1. All patients should optimize calcium and vitamin D intake. 2. Consider FDA-approved medical therapies in postmenopausal women and men aged 57 years and older, based on the following: a. A hip or vertebral(clinical or morphometric) fracture b. T-score < -2.5 at the femoral neck or spine after appropriate evaluation to exclude secondary causes c. Low bone mass (T-score between -1.0 and -2.5 at the femoral neck or spine) and a 10-year probability of a hip fracture > 3% or a 10-year probability of a major osteoporosis-related fracture > 20% based on the [place]-adapted WHO algorithm d. Clinician judgment and/or patient preferences may indicate treatment for people with 10-year fracture probabilities above or below these levels FOLLOW-UP: People with diagnosed cases of osteoporosis or at high risk for fracture should have regular bone mineral density tests. For patients  eligible for Medicare, routine testing is allowed once every 2 years. The testing frequency can be increased to one year for patients who have rapidly progressing disease, those who are receiving or discontinuing medical therapy to restore bone mass, or have additional risk factors. I have reviewed this report, and agree with the above findings. [facility] Radiology Electronically Signed   By: Lowella Grip III M.D.   On: 01/05/2019 10:29   Mm 3d Screen Breast Bilateral  Result Date: 01/05/2019 CLINICAL DATA:  Screening. EXAM: DIGITAL SCREENING BILATERAL MAMMOGRAM WITH TOMO AND CAD COMPARISON:  Previous exam(s). ACR Breast Density Category b: There are scattered areas of fibroglandular density. FINDINGS: There are no findings suspicious for malignancy. Images were processed with CAD. IMPRESSION: No mammographic evidence of malignancy. A result letter of this screening mammogram will be mailed directly to the patient. RECOMMENDATION: Screening mammogram in one year. (Code:SM-B-01Y) BI-RADS CATEGORY  1: Negative. Electronically Signed   By: Fidela Salisbury M.D.   On: 01/05/2019 11:48    ASSESSMENT & PLAN:    1. Iron deficiency anemia: Patient reportedly had a normal colonoscopy in March 2016.  Patient's hemoglobin has trended down is now 8.8 despite recent transfusion in the emergency room.  Proceed with 510 mg IV Feraheme today.  Return to clinic in 1 week for second infusion.  Patient will then return to clinic in 2 months for repeat laboratory work and further evaluation.  2. History of melanoma: Melanoma of left thigh status post resection in 1993, exact stage is not known, metastatic to inguinal lymph node. No evidence of recurrent disease. 3. Back Pain: Chronic and unchanged.  Patient had surgery in September 2017.  4. Hypertension: Patient's blood pressure is within normal limits today.  I spent a total of 30 minutes face-to-face with the patient of which greater than 50% of the visit was  spent in counseling and coordination of care as detailed  above.   Patient expressed understanding and was in agreement with this plan. She also understands that She can call clinic at any time with any questions, concerns, or complaints.    Lloyd Huger, MD   01/07/2019 7:09 AM

## 2018-12-30 NOTE — ED Triage Notes (Signed)
Pt to ED from home c/o chest tightness for several weeks but progressively getting worse.  States tightness wraps around chest, some right arm pain, noticed swelling in bilateral lower legs and abd, non pitting.  States SOB worse with exertion, denies cough or fevers, or n/v/d.  Pt presents A&Ox4, speaking in complete and coherent sentences, chest rise even and unlabored, in NAD at this time.

## 2018-12-30 NOTE — H&P (Signed)
[place] [facility] [place] NAME: Teddi Badalamenti    MR#:  [id number]  DATE OF BIRTH:  05/06/1946  DATE OF ADMISSION:  12/30/2018  PRIMARY CARE PHYSICIAN: McLean-Scocuzza, Nino Glow, MD   REQUESTING/REFERRING PHYSICIAN: Dr Charlotte Crumb  CHIEF COMPLAINT:   Chief Complaint  Patient presents with  . Chest Pain    HISTORY OF PRESENT ILLNESS:  Derika Eckles  is a 73 y.o. female with a known history of anemia presents to the hospital with shortness of breath with exertion some pressure in her torso.  Progressive fatigue and edema of the lower extremities.  She follows up with a gastroenterologist in [place] and Dr. Grayland Ormond oncology.  She declined any further testing while here in the hospital.  She declined rectal exam with ER physician.  She thinks she will feel better with the blood transfusion.  PAST MEDICAL HISTORY:   Past Medical History:  Diagnosis Date  . Adenomatous colon polyp   . Arthritis   . Childhood asthma    AS A CHILD  . Chronic heartburn    On omeprazole  . Complication of anesthesia    HARD TO WAKE UP  . Dyspnea    low hgb. and iron  chronic problem  . External hemorrhoids   . Frequency of urination   . Frequent loose stools   . GERD (gastroesophageal reflux disease)   . Heme positive stool   . History of hiatal hernia   . Hyperlipidemia   . Hypothyroidism   . Iron deficiency anemia   . Melanoma ([place])    left thigh s/p LN removal left groin  . Metastatic melanoma ([place])    Followed by Dr Oliva Bustard, previous chemo, no recurrence, 2008?  . Multinodular goiter   . SCC (squamous cell carcinoma)    skin    PAST SURGICAL HISTORY:   Past Surgical History:  Procedure Laterality Date  . ABDOMINAL HYSTERECTOMY  1984  . BACK SURGERY  03/2016   LUMBAR  . CARPAL TUNNEL RELEASE Right 09/07/2016   Procedure: CARPAL TUNNEL RELEASE;  Surgeon: Thornton Park, MD;  Location: [facility] ORS;  Service: Orthopedics;   Laterality: Right;  . CHOLECYSTECTOMY  1990  . COLONOSCOPY  08/2006   Four sessile polyps found and removed in sigmoid colon at splenic flexure and ascending colon. 7 mm in size. Another removed from transverse colon 4 mm. Path Report - showed tubular adenoma and hyperplastic polyp. Advised to repeat in 3.5 years (02/2010)  . COLONOSCOPY  3.2.2011   8 mm polyp in sigmoid colon, removed, 4 mm polyp in descending colon, removed. Internal hemorrhoids  . ESOPHAGOGASTRODUODENOSCOPY  3.2.2011   Large hiatia hernia, esophagus normal, mulitple small sessile polyps w/no stigmata of recent bleeding found. Mildy erythermatous mucosa w/no bleeding found in gatric antrum. Normal duodenum. 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 PATH - no active inflammation, antral mucosa w/mild foveolar hyperplasia  . FRACTURE SURGERY    . Lymph Node Removal  12/2005   Left inguinal lymph node dissection  . MELANOMA EXCISION Left 1993   Left thigh  . ORIF ANKLE FRACTURE Right    Dr. Mack Guise  . TOTAL HIP ARTHROPLASTY Right 11/18/2016   Procedure: TOTAL HIP ARTHROPLASTY;  Surgeon: Thornton Park, MD;  Location: [facility] ORS;  Service: Orthopedics;  Laterality: Right;    SOCIAL HISTORY:   Social History   Tobacco Use  . Smoking status: Former Smoker    Packs/day: 0.50    Years: 8.00  Pack years: 4.00    Types: Cigarettes    Quit date: 07/12/1978    Years since quitting: 40.4  . Smokeless tobacco: Never Used  Substance Use Topics  . Alcohol use: No    Alcohol/week: 0.0 standard drinks    FAMILY HISTORY:   Family History  Problem Relation Age of Onset  . Aneurysm Mother   . Heart disease Mother   . Colon polyps Father   . Diabetes Father   . Dementia Father   . Stroke Father        TIA  . Leukemia Maternal Grandfather   . Skin cancer Paternal Grandfather   . Diabetes Other   . Colon polyps Other   . Colon cancer Neg Hx   . Breast cancer Neg Hx     DRUG ALLERGIES:   Allergies   Allergen Reactions  . Sulfa Antibiotics Rash    REVIEW OF SYSTEMS:  CONSTITUTIONAL: No fever, chills or sweats.  Positive for fatigue.  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 No sore throat RESPIRATORY: No cough, positive for shortness of breath.  No wheezing or hemoptysis.  CARDIOVASCULAR: Occasional chest pain, some edema.  GASTROINTESTINAL: No nausea, vomiting, or abdominal pain. No blood in bowel movements.  Occasional diarrhea GENITOURINARY: No dysuria, hematuria.  ENDOCRINE: No polyuria, nocturia,  HEMATOLOGY: History of anemia, no easy bruising or bleeding SKIN: No rash or lesion. MUSCULOSKELETAL: No joint pain or arthritis.   NEUROLOGIC: No tingling, numbness, weakness.  PSYCHIATRY: No anxiety or depression.   MEDICATIONS AT HOME:   Prior to Admission medications   Medication Sig Start Date End Date Taking? Authorizing Provider  acetaminophen (TYLENOL 8 HOUR ARTHRITIS PAIN) 650 MG CR tablet Take 1,300 mg by mouth daily.     [provider]  Calcium Carb-Cholecalciferol (CALCIUM 600+D) 600-800 MG-UNIT TABS Take 1 tablet by mouth daily with lunch.    [provider]  celecoxib (CELEBREX) 200 MG capsule Take 200 mg by mouth daily.  08/20/16   [provider]  citalopram (CELEXA) 20 MG tablet Take 1 tablet (20 mg total) by mouth daily. 08/22/18   McLean-Scocuzza, Nino Glow, MD  Ferrous Sulfate Dried (SLOW RELEASE IRON) 45 MG TBCR Take 45 mg by mouth daily with lunch.    [provider]  levothyroxine (SYNTHROID, LEVOTHROID) 125 MCG tablet Take 1 tablet (125 mcg total) by mouth daily before breakfast. 08/22/18   McLean-Scocuzza, Nino Glow, MD  loperamide (IMODIUM) 2 MG capsule Take 2 mg by mouth every morning.    [provider]  Multiple Vitamins-Minerals (EYE VITAMINS & MINERALS) TABS Take 1 tablet by mouth daily.     [provider]  NON FORMULARY Apply 1 application  topically 2 (two) times daily as needed (muscle pain). Horse Liniment    [provider]  omeprazole (PRILOSEC) 20 MG capsule Take 1 capsule (20 mg total) by mouth daily. 08/22/18   McLean-Scocuzza, Nino Glow, MD  pravastatin (PRAVACHOL) 40 MG tablet Take 0.5 tablets (20 mg total) by mouth daily. 08/22/18   McLean-Scocuzza, Nino Glow, MD  Propylene Glycol (SYSTANE BALANCE OP) Place 1 drop into both eyes daily as needed (dry eyes).    [provider]  TURMERIC PO Take 1 capsule by mouth daily at 12 noon.    [provider]      VITAL SIGNS:  Blood pressure (!) 115/55, pulse 66, temperature 97.7 F (36.5 C), temperature source Oral,  resp. rate 14, height 5\' 2"  (1.575 m), weight 94.3 kg, SpO2 97 %.  PHYSICAL EXAMINATION:  GENERAL:  73 y.o.-year-old patient lying in the bed with no acute distress.  EYES: Pupils equal, round, reactive to light and accommodation. No scleral icterus. Extraocular muscles intact.  HEENT: Head atraumatic, normocephalic. Oropharynx and nasopharynx clear.  NECK:  Supple, no jugular venous distention. No thyroid enlargement, no tenderness.  LUNGS: Normal breath sounds bilaterally, no wheezing, rales,rhonchi or crepitation. No use of accessory muscles of respiration.  CARDIOVASCULAR: S1, S2 normal.  3 out of 6 systolic murmur, no rubs, or gallops.  ABDOMEN: Soft, nontender, nondistended. Bowel sounds present. No organomegaly or mass.  EXTREMITIES: No pedal edema, cyanosis, or clubbing.  NEUROLOGIC: Cranial nerves II through XII are intact. Muscle strength 5/5 in all extremities. Sensation intact. Gait not checked.  PSYCHIATRIC: The patient is alert and oriented x 3.  SKIN: No rash, lesion, or ulcer.   LABORATORY PANEL:   CBC Recent Labs  Lab 12/30/18 1125  WBC 5.0  5.4  HGB 5.6*  5.4*  HCT 20.6*  20.5*  PLT 147*  161    ------------------------------------------------------------------------------------------------------------------  Chemistries  Recent Labs  Lab 12/30/18 1125  NA 140  K 4.2  CL 106  CO2 28  GLUCOSE 109*  BUN 9  CREATININE 0.98  CALCIUM 8.4*   ------------------------------------------------------------------------------------------------------------------  Cardiac Enzymes Recent Labs  Lab 12/30/18 1125  TROPONINI <0.03   ------------------------------------------------------------------------------------------------------------------  RADIOLOGY:  Dg Chest 2 View  Result Date: 12/30/2018 CLINICAL DATA:  Abdominal pain. Bilateral lower extremity edema. Shortness of breath. EXAM: CHEST - 2 VIEW COMPARISON:  07/15/2018 FINDINGS: Cardiomediastinal silhouette is mildly enlarged. Calcific atherosclerotic disease of the aorta. Hiatal hernia. There is no evidence of focal airspace consolidation, pleural effusion or pneumothorax. Osseous structures are without acute abnormality. Soft tissues are grossly normal. IMPRESSION: 1. Mildly enlarged cardiac silhouette. 2. Calcific atherosclerotic disease of the aorta. 3. Hiatal hernia. Electronically Signed   By: Fidela Salisbury M.D.   On: 12/30/2018 14:51    EKG:   Sinus rhythm 60 bpm  IMPRESSION AND PLAN:   1.  Symptomatic anemia.  Transfuse 2 units of packed red blood cells.  May end up needing a 3rd unit of packed red blood cells tomorrow if hemoglobin low.  We will give IV Venofer.  Patient refused any further work-up here in the hospital.  Will bring in as an observation likely will go home tomorrow.  Follow-up with Dr. Grayland Ormond oncology as outpatient.  Follow-up with her gastroenterologist in [place] as outpatient.  Stop Celebrex.  Guaiac stools. 2.  GERD on omeprazole 3.  Hyperlipidemia unspecified on pravastatin 4.  Hypothyroidism unspecified on levothyroxine 5.  Depression on Celexa    All the records are reviewed  and case discussed with ED provider. Management plans discussed with the patient, family and they are in agreement.  CODE STATUS: full code  TOTAL TIME TAKING CARE OF THIS PATIENT: 50 minutes.    Loletha Grayer M.D on 12/30/2018 at 6:01 PM  Between 7am to 6pm - Pager - [phone]  After 6pm call admission pager [phone]  [facility]  [phone]  CC: Primary care physician; McLean-Scocuzza, Nino Glow, MD

## 2018-12-31 DIAGNOSIS — E039 Hypothyroidism, unspecified: Secondary | ICD-10-CM | POA: Diagnosis not present

## 2018-12-31 DIAGNOSIS — E785 Hyperlipidemia, unspecified: Secondary | ICD-10-CM | POA: Diagnosis not present

## 2018-12-31 DIAGNOSIS — K219 Gastro-esophageal reflux disease without esophagitis: Secondary | ICD-10-CM | POA: Diagnosis not present

## 2018-12-31 DIAGNOSIS — R079 Chest pain, unspecified: Secondary | ICD-10-CM | POA: Diagnosis not present

## 2018-12-31 DIAGNOSIS — D649 Anemia, unspecified: Secondary | ICD-10-CM | POA: Diagnosis not present

## 2018-12-31 LAB — CBC
HCT: 25.1 % — ABNORMAL LOW (ref 36.0–46.0)
Hemoglobin: 7.3 g/dL — ABNORMAL LOW (ref 12.0–15.0)
MCH: 25.3 pg — ABNORMAL LOW (ref 26.0–34.0)
MCHC: 29.1 g/dL — ABNORMAL LOW (ref 30.0–36.0)
MCV: 86.9 fL (ref 80.0–100.0)
Platelets: 151 10*3/uL (ref 150–400)
RBC: 2.89 MIL/uL — ABNORMAL LOW (ref 3.87–5.11)
RDW: 15.7 % — ABNORMAL HIGH (ref 11.5–15.5)
WBC: 6.4 10*3/uL (ref 4.0–10.5)
nRBC: 0 % (ref 0.0–0.2)

## 2018-12-31 LAB — BASIC METABOLIC PANEL
Anion gap: 7 (ref 5–15)
BUN: 8 mg/dL (ref 8–23)
CO2: 27 mmol/L (ref 22–32)
Calcium: 8.3 mg/dL — ABNORMAL LOW (ref 8.9–10.3)
Chloride: 107 mmol/L (ref 98–111)
Creatinine, Ser: 0.92 mg/dL (ref 0.44–1.00)
GFR calc Af Amer: >60 mL/min (ref >60)
GFR calc non Af Amer: >60 mL/min (ref >60)
Glucose, Bld: 104 mg/dL — ABNORMAL HIGH (ref 70–99)
Potassium: 4.2 mmol/L (ref 3.5–5.1)
Sodium: 141 mmol/L (ref 135–145)

## 2018-12-31 LAB — HEMOGLOBIN AND HEMATOCRIT, BLOOD
HCT: 29.9 % — ABNORMAL LOW (ref 36.0–46.0)
Hemoglobin: 8.8 g/dL — ABNORMAL LOW (ref 12.0–15.0)

## 2018-12-31 LAB — PREPARE RBC (CROSSMATCH)

## 2018-12-31 LAB — TSH: TSH: 0.557 u[IU]/mL (ref 0.350–4.500)

## 2018-12-31 MED ORDER — DIPHENHYDRAMINE HCL 50 MG/ML IJ SOLN
25.0000 mg | Freq: Once | INTRAMUSCULAR | Status: DC
  Filled 2018-12-31: qty 1

## 2018-12-31 MED ORDER — FUROSEMIDE 10 MG/ML IJ SOLN
20.0000 mg | Freq: Once | INTRAMUSCULAR | Status: AC
  Administered 2018-12-31: 20 mg via INTRAVENOUS
  Filled 2018-12-31: qty 4

## 2018-12-31 MED ORDER — ACETAMINOPHEN 325 MG PO TABS
650.0000 mg | ORAL_TABLET | Freq: Once | ORAL | Status: DC
  Filled 2018-12-31: qty 2

## 2018-12-31 MED ORDER — SODIUM CHLORIDE 0.9% IV SOLUTION: Freq: Once | INTRAVENOUS | Status: DC

## 2018-12-31 NOTE — Progress Notes (Signed)
Pnt received 2 units of PRBC's and iron transfusion overnight with no issues. Tolerated well and only symptom was SOB at this time. Will continue to monitor.

## 2018-12-31 NOTE — Progress Notes (Signed)
Blood transfusion complete, H&H ordered for recheck, Per MD Salary pt does not need to wait for results and can be d/c home.

## 2018-12-31 NOTE — Plan of Care (Signed)

## 2018-12-31 NOTE — Discharge Summary (Signed)
[facility] [place] NAME: Janet Rice    MR#:  [id number]  DATE OF BIRTH:  [birth year]  DATE OF ADMISSION:  12/30/2018 ADMITTING PHYSICIAN: Loletha Grayer, MD  DATE OF DISCHARGE: No discharge date for patient encounter.  PRIMARY CARE PHYSICIAN: McLean-Scocuzza, Nino Glow, MD    ADMISSION DIAGNOSIS:  Anemia, unspecified type [D64.9] Chest pain, unspecified type [R07.9]  DISCHARGE DIAGNOSIS:  Active Problems:   Anemia   SECONDARY DIAGNOSIS:   Past Medical History:  Diagnosis Date  . Adenomatous colon polyp   . Arthritis   . Childhood asthma    AS A CHILD  . Chronic heartburn    On omeprazole  . Complication of anesthesia    HARD TO WAKE UP  . Dyspnea    low hgb. and iron  chronic problem  . External hemorrhoids   . Frequency of urination   . Frequent loose stools   . GERD (gastroesophageal reflux disease)   . Heme positive stool   . History of hiatal hernia   . Hyperlipidemia   . Hypothyroidism   . Iron deficiency anemia   . Melanoma ([place])    left thigh s/p LN removal left groin  . Metastatic melanoma ([place])    Followed by Dr Oliva Bustard, previous chemo, no recurrence, 2008?  . Multinodular goiter   . SCC (squamous cell carcinoma)    skin    HOSPITAL COURSE:  * Acute symptomatic anemia Resolved status post 3 unit packed red blood cell transfusion, did receive IV iron while in house, outpatient follow-up for continued anemia management by Dr. Finnegan/oncology arrange for in 1-2 weeks for reevaluation, Celebrex discontinued Patient refused any further work-up here in the hospital as she has undergone extensive work-up in the past Advised to follow-up with a gastroenterologist in [place] as directed   *GERD  Omeprazole  *Hyperlipidemia unspecified  Pravastatin  *Hypothyroidism unspecified Levothyroxine  *Depression  Celexa  DISCHARGE CONDITIONS:   stable  CONSULTS OBTAINED:    DRUG ALLERGIES:    Allergies  Allergen Reactions  . Sulfa Antibiotics Rash    DISCHARGE MEDICATIONS:   Allergies as of 12/31/2018      Reactions   Sulfa Antibiotics Rash      Medication List    STOP taking these medications   celecoxib 200 MG capsule Commonly known as: CELEBREX     TAKE these medications   Calcium 600+D 600-800 MG-UNIT Tabs Generic drug: Calcium Carb-Cholecalciferol Take 1 tablet by mouth daily with lunch.   citalopram 20 MG tablet Commonly known as: CELEXA Take 1 tablet (20 mg total) by mouth daily.   Eye Vitamins & Minerals Tabs Take 1 tablet by mouth daily.   levothyroxine 125 MCG tablet Commonly known as: SYNTHROID Take 1 tablet (125 mcg total) by mouth daily before breakfast.   loperamide 2 MG capsule Commonly known as: IMODIUM Take 2 mg by mouth every morning.   NON FORMULARY Apply 1 application topically 2 (two) times daily as needed (muscle pain). Horse Liniment   omeprazole 20 MG capsule Commonly known as: PRILOSEC Take 1 capsule (20 mg total) by mouth daily.   pravastatin 40 MG tablet Commonly known as: PRAVACHOL Take 0.5 tablets (20 mg total) by mouth daily.   Slow Release Iron 45 MG Tbcr Generic drug: Ferrous Sulfate Dried Take 45 mg by mouth daily with lunch.   SYSTANE BALANCE OP Place 1 drop into both eyes daily as needed (dry eyes).   TURMERIC PO Take  1 capsule by mouth daily at 12 noon.   Tylenol 8 Hour Arthritis Pain 650 MG CR tablet Generic drug: acetaminophen Take 1,300 mg by mouth daily.        DISCHARGE INSTRUCTIONS:  If you experience worsening of your admission symptoms, develop shortness of breath, life threatening emergency, suicidal or homicidal thoughts you must seek medical attention immediately by calling 911 or calling your MD immediately  if symptoms less severe.  You Must read complete instructions/literature along with all the possible adverse reactions/side effects for all the Medicines you take and that have  been prescribed to you. Take any new Medicines after you have completely understood and accept all the possible adverse reactions/side effects.   Please note  You were cared for by a hospitalist during your hospital stay. If you have any questions about your discharge medications or the care you received while you were in the hospital after you are discharged, you can call the unit and asked to speak with the hospitalist on call if the hospitalist that took care of you is not available. Once you are discharged, your primary care physician will handle any further medical issues. Please note that NO REFILLS for any discharge medications will be authorized once you are discharged, as it is imperative that you return to your primary care physician (or establish a relationship with a primary care physician if you do not have one) for your aftercare needs so that they can reassess your need for medications and monitor your lab values.    Today   CHIEF COMPLAINT:   Chief Complaint  Patient presents with  . Chest Pain    HISTORY OF PRESENT ILLNESS:  73 y.o. female with a known history of anemia presents to the hospital with shortness of breath with exertion some pressure in her torso.  Progressive fatigue and edema of the lower extremities.  She follows up with a gastroenterologist in [place] and Dr. Grayland Ormond oncology.  She declined any further testing while here in the hospital.  She declined rectal exam with ER physician.  She thinks she will feel better with the blood transfusion.  VITAL SIGNS:  Blood pressure 129/64, pulse 72, temperature 98.2 F (36.8 C), temperature source Oral, resp. rate 16, height 5\' 2"  (1.575 m), weight 94.3 kg, SpO2 95 %.  I/O:    Intake/Output Summary (Last 24 hours) at 12/31/2018 1102 Last data filed at 12/31/2018 0300 Gross per 24 hour  Intake 986.59 ml  Output -  Net 986.59 ml    PHYSICAL EXAMINATION:  GENERAL:  73 y.o.-year-old patient lying in the bed  with no acute distress.  EYES: Pupils equal, round, reactive to light and accommodation. No scleral icterus. Extraocular muscles intact.  HEENT: Head atraumatic, normocephalic. Oropharynx and nasopharynx clear.  NECK:  Supple, no jugular venous distention. No thyroid enlargement, no tenderness.  LUNGS: Normal breath sounds bilaterally, no wheezing, rales,rhonchi or crepitation. No use of accessory muscles of respiration.  CARDIOVASCULAR: S1, S2 normal. No murmurs, rubs, or gallops.  ABDOMEN: Soft, non-tender, non-distended. Bowel sounds present. No organomegaly or mass.  EXTREMITIES: No pedal edema, cyanosis, or clubbing.  NEUROLOGIC: Cranial nerves II through XII are intact. Muscle strength 5/5 in all extremities. Sensation intact. Gait not checked.  PSYCHIATRIC: The patient is alert and oriented x 3.  SKIN: No obvious rash, lesion, or ulcer.   DATA REVIEW:   CBC Recent Labs  Lab 12/31/18 0611  WBC 6.4  HGB 7.3*  HCT 25.1*  PLT 151  Chemistries  Recent Labs  Lab 12/31/18 0611  NA 141  K 4.2  CL 107  CO2 27  GLUCOSE 104*  BUN 8  CREATININE 0.92  CALCIUM 8.3*    Cardiac Enzymes Recent Labs  Lab 12/30/18 1125  False Pass <0.03    Microbiology Results  Results for orders placed or performed during the hospital encounter of 12/30/18  SARS Coronavirus 2 (CEPHEID - Performed in [facility] hospital lab), Hosp Order     Status: None   Collection Time: 12/30/18  4:20 PM   Specimen: Nasopharyngeal Swab  Result Value Ref Range Status   SARS Coronavirus 2 NEGATIVE NEGATIVE Final    Comment: (NOTE) If result is NEGATIVE SARS-CoV-2 target nucleic acids are NOT DETECTED. The SARS-CoV-2 RNA is generally detectable in upper and lower  respiratory specimens during the acute phase of infection. The lowest  concentration of SARS-CoV-2 viral copies this assay can detect is 250  copies / mL. A negative result does not preclude SARS-CoV-2 infection  and should not be used as the  sole basis for treatment or other  patient management decisions.  A negative result may occur with  improper specimen collection / handling, submission of specimen other  than nasopharyngeal swab, presence of viral mutation(s) within the  areas targeted by this assay, and inadequate number of viral copies  (<250 copies / mL). A negative result must be combined with clinical  observations, patient history, and epidemiological information. If result is POSITIVE SARS-CoV-2 target nucleic acids are DETECTED. The SARS-CoV-2 RNA is generally detectable in upper and lower  respiratory specimens dur ing the acute phase of infection.  Positive  results are indicative of active infection with SARS-CoV-2.  Clinical  correlation with patient history and other diagnostic information is  necessary to determine patient infection status.  Positive results do  not rule out bacterial infection or co-infection with other viruses. If result is PRESUMPTIVE POSTIVE SARS-CoV-2 nucleic acids MAY BE PRESENT.   A presumptive positive result was obtained on the submitted specimen  and confirmed on repeat testing.  While 2019 novel coronavirus  (SARS-CoV-2) nucleic acids may be present in the submitted sample  additional confirmatory testing may be necessary for epidemiological  and / or clinical management purposes  to differentiate between  SARS-CoV-2 and other Sarbecovirus currently known to infect humans.  If clinically indicated additional testing with an alternate test  methodology [phone]) is advised. The SARS-CoV-2 RNA is generally  detectable in upper and lower respiratory sp ecimens during the acute  phase of infection. The expected result is Negative. Fact Sheet for Patients:  StrictlyIdeas.no Fact Sheet for Healthcare Providers: BankingDealers.co.za This test is not yet approved or cleared by the [place] FDA and has been authorized for detection  and/or diagnosis of SARS-CoV-2 by FDA under an Emergency Use Authorization (EUA).  This EUA will remain in effect (meaning this test can be used) for the duration of the COVID-19 declaration under Section 564(b)(1) of the Act, 21 U.S.C. section 360bbb-3(b)(1), unless the authorization is terminated or revoked sooner. Performed at [facility], [address], [place], [place] [address]     RADIOLOGY:  Dg Chest 2 View  Result Date: 12/30/2018 CLINICAL DATA:  Abdominal pain. Bilateral lower extremity edema. Shortness of breath. EXAM: CHEST - 2 VIEW COMPARISON:  07/15/2018 FINDINGS: Cardiomediastinal silhouette is mildly enlarged. Calcific atherosclerotic disease of the aorta. Hiatal hernia. There is no evidence of focal airspace consolidation, pleural effusion or pneumothorax. Osseous structures are without acute  abnormality. Soft tissues are grossly normal. IMPRESSION: 1. Mildly enlarged cardiac silhouette. 2. Calcific atherosclerotic disease of the aorta. 3. Hiatal hernia. Electronically Signed   By: Fidela Salisbury M.D.   On: 12/30/2018 14:51    EKG:   Orders placed or performed during the hospital encounter of 12/30/18  . ED EKG  . ED EKG      Management plans discussed with the patient, family and they are in agreement.  CODE STATUS:     Code Status Orders  (From admission, onward)         Start     Ordered   12/30/18 1757  Do not attempt resuscitation (DNR)  Continuous    Question Answer Comment  In the event of cardiac or respiratory ARREST Do not call a "code blue"   In the event of cardiac or respiratory ARREST Do not perform Intubation, CPR, defibrillation or ACLS   In the event of cardiac or respiratory ARREST Use medication by any route, position, wound care, and other measures to relive pain and suffering. May use oxygen, suction and manual treatment of airway obstruction as needed for comfort.   Comments nurse may pronounce      12/30/18 1757         Code Status History    Date Active Date Inactive Code Status Order ID Comments User Context   07/10/2018 1919 07/16/2018 2136 Full Code [id number]  Newman Pies, MD Inpatient   11/18/2016 1230 11/20/2016 1904 Full Code [id number]  Thornton Park, MD Inpatient   04/08/2016 1744 04/10/2016 1548 Full Code [id number]  Newman Pies, MD Inpatient   Advance Care Planning Activity    Advance Directive Documentation     Most Recent Value  Type of Advance Directive  Healthcare Power of Attorney, Living will  Pre-existing out of facility DNR order (yellow form or pink MOST form)  -  "MOST" Form in Place?  -      TOTAL TIME TAKING CARE OF THIS PATIENT: 40 minutes.    Avel Peace Abbigal Radich M.D on 12/31/2018 at 11:02 AM  Between 7am to 6pm - Pager - [phone]  After 6pm go to [email or web address] - password [email or web address] [facility]  Office  [phone]  CC: Primary care physician; McLean-Scocuzza, Nino Glow, MD   Note: This dictation was prepared with Dragon dictation along with smaller phrase technology. Any transcriptional errors that result from this process are unintentional.

## 2018-12-31 NOTE — Progress Notes (Signed)
DISCHARGE NOTE:  Pt given discharge instructions. Pt verbalized understanding. Pt wheeled to car by staff. Husband providing transportation.

## 2019-01-01 ENCOUNTER — Encounter: Payer: Self-pay | Admitting: Oncology

## 2019-01-01 ENCOUNTER — Ambulatory Visit: Payer: Medicare Other

## 2019-01-01 ENCOUNTER — Ambulatory Visit: Payer: Medicare Other | Admitting: Internal Medicine

## 2019-01-01 LAB — TYPE AND SCREEN
ABO/RH(D): AB NEG
Antibody Screen: NEGATIVE
Unit division: 0
Unit division: 0
Unit division: 0

## 2019-01-01 LAB — BPAM RBC
Blood Product Expiration Date: 202006232359
Blood Product Expiration Date: 202006262359
Blood Product Expiration Date: 202007012359
ISSUE DATE / TIME: 202006201820
ISSUE DATE / TIME: 202006202214
ISSUE DATE / TIME: 202006211113
Unit Type and Rh: 600
Unit Type and Rh: 600
Unit Type and Rh: 600

## 2019-01-02 DIAGNOSIS — Z85820 Personal history of malignant melanoma of skin: Secondary | ICD-10-CM | POA: Diagnosis not present

## 2019-01-02 DIAGNOSIS — D2271 Melanocytic nevi of right lower limb, including hip: Secondary | ICD-10-CM | POA: Diagnosis not present

## 2019-01-02 DIAGNOSIS — L821 Other seborrheic keratosis: Secondary | ICD-10-CM | POA: Diagnosis not present

## 2019-01-02 DIAGNOSIS — D2262 Melanocytic nevi of left upper limb, including shoulder: Secondary | ICD-10-CM | POA: Diagnosis not present

## 2019-01-02 DIAGNOSIS — D2272 Melanocytic nevi of left lower limb, including hip: Secondary | ICD-10-CM | POA: Diagnosis not present

## 2019-01-02 DIAGNOSIS — Z85828 Personal history of other malignant neoplasm of skin: Secondary | ICD-10-CM | POA: Diagnosis not present

## 2019-01-02 DIAGNOSIS — Z872 Personal history of diseases of the skin and subcutaneous tissue: Secondary | ICD-10-CM | POA: Diagnosis not present

## 2019-01-02 DIAGNOSIS — D509 Iron deficiency anemia, unspecified: Secondary | ICD-10-CM | POA: Diagnosis not present

## 2019-01-02 DIAGNOSIS — D2261 Melanocytic nevi of right upper limb, including shoulder: Secondary | ICD-10-CM | POA: Diagnosis not present

## 2019-01-02 DIAGNOSIS — D225 Melanocytic nevi of trunk: Secondary | ICD-10-CM | POA: Diagnosis not present

## 2019-01-02 DIAGNOSIS — Z08 Encounter for follow-up examination after completed treatment for malignant neoplasm: Secondary | ICD-10-CM | POA: Diagnosis not present

## 2019-01-03 ENCOUNTER — Encounter: Payer: Self-pay | Admitting: Oncology

## 2019-01-04 ENCOUNTER — Other Ambulatory Visit: Payer: Self-pay

## 2019-01-05 ENCOUNTER — Encounter: Payer: Self-pay | Admitting: Oncology

## 2019-01-05 ENCOUNTER — Ambulatory Visit
Admission: RE | Admit: 2019-01-05 | Discharge: 2019-01-05 | Disposition: A | Discharge status: Home / Self Care | Payer: Medicare Other | Source: Ambulatory Visit | Attending: Internal Medicine | Admitting: Internal Medicine

## 2019-01-05 ENCOUNTER — Inpatient Hospital Stay: Payer: Medicare Other | Attending: Oncology | Admitting: Oncology

## 2019-01-05 ENCOUNTER — Inpatient Hospital Stay: Payer: Medicare Other

## 2019-01-05 ENCOUNTER — Other Ambulatory Visit: Payer: Self-pay

## 2019-01-05 VITALS — BP 120/65 | HR 73 | Temp 96.4°F | Ht 62.0 in | Wt 228.0 lb

## 2019-01-05 VITALS — BP 100/60 | HR 60 | Temp 96.5°F | Resp 18

## 2019-01-05 DIAGNOSIS — Z1231 Encounter for screening mammogram for malignant neoplasm of breast: Secondary | ICD-10-CM | POA: Diagnosis not present

## 2019-01-05 DIAGNOSIS — G8929 Other chronic pain: Secondary | ICD-10-CM | POA: Insufficient documentation

## 2019-01-05 DIAGNOSIS — M810 Age-related osteoporosis without current pathological fracture: Secondary | ICD-10-CM | POA: Insufficient documentation

## 2019-01-05 DIAGNOSIS — M8589 Other specified disorders of bone density and structure, multiple sites: Secondary | ICD-10-CM | POA: Diagnosis not present

## 2019-01-05 DIAGNOSIS — Z85820 Personal history of malignant melanoma of skin: Secondary | ICD-10-CM | POA: Diagnosis not present

## 2019-01-05 DIAGNOSIS — D509 Iron deficiency anemia, unspecified: Secondary | ICD-10-CM | POA: Insufficient documentation

## 2019-01-05 DIAGNOSIS — I10 Essential (primary) hypertension: Secondary | ICD-10-CM | POA: Diagnosis not present

## 2019-01-05 DIAGNOSIS — M549 Dorsalgia, unspecified: Secondary | ICD-10-CM | POA: Insufficient documentation

## 2019-01-05 DIAGNOSIS — Z780 Asymptomatic menopausal state: Secondary | ICD-10-CM | POA: Diagnosis not present

## 2019-01-05 DIAGNOSIS — D508 Other iron deficiency anemias: Secondary | ICD-10-CM

## 2019-01-05 MED ORDER — SODIUM CHLORIDE 0.9 % IV SOLN
INTRAVENOUS | Status: DC
  Administered 2019-01-05: 14:00:00 via INTRAVENOUS
  Filled 2019-01-05: qty 250

## 2019-01-05 MED ORDER — SODIUM CHLORIDE 0.9 % IV SOLN
510.0000 mg | Freq: Once | INTRAVENOUS | Status: AC
  Administered 2019-01-05: 510 mg via INTRAVENOUS
  Filled 2019-01-05: qty 17

## 2019-01-05 NOTE — Progress Notes (Signed)
Patient stated that she went to the ED this weekend due to not feeling well. They told her that hemoglobin was too low and was giving a transfusion. Patient stated that she had felt better after that but yet continues to feel tired and fatigued.

## 2019-01-09 DIAGNOSIS — M1712 Unilateral primary osteoarthritis, left knee: Secondary | ICD-10-CM | POA: Diagnosis not present

## 2019-01-09 DIAGNOSIS — M1611 Unilateral primary osteoarthritis, right hip: Secondary | ICD-10-CM | POA: Diagnosis not present

## 2019-01-10 ENCOUNTER — Other Ambulatory Visit: Payer: Self-pay

## 2019-01-11 ENCOUNTER — Other Ambulatory Visit: Payer: Self-pay

## 2019-01-11 ENCOUNTER — Inpatient Hospital Stay: Payer: Medicare Other | Attending: Oncology

## 2019-01-11 VITALS — BP 107/64 | HR 77 | Resp 20

## 2019-01-11 DIAGNOSIS — D509 Iron deficiency anemia, unspecified: Secondary | ICD-10-CM | POA: Insufficient documentation

## 2019-01-11 DIAGNOSIS — D508 Other iron deficiency anemias: Secondary | ICD-10-CM

## 2019-01-11 MED ORDER — SODIUM CHLORIDE 0.9 % IV SOLN
INTRAVENOUS | Status: DC
  Administered 2019-01-11: 14:00:00 via INTRAVENOUS
  Filled 2019-01-11: qty 250

## 2019-01-11 MED ORDER — SODIUM CHLORIDE 0.9 % IV SOLN
510.0000 mg | Freq: Once | INTRAVENOUS | Status: AC
  Administered 2019-01-11: 510 mg via INTRAVENOUS
  Filled 2019-01-11: qty 17

## 2019-02-25 ENCOUNTER — Encounter: Payer: Self-pay | Admitting: Internal Medicine

## 2019-02-26 NOTE — Patient Instructions (Signed)
Denosumab injection What is this medicine? DENOSUMAB (den oh sue mab) slows bone breakdown. Prolia is used to treat osteoporosis in women after menopause and in men, and in people who are taking corticosteroids for 6 months or more. Xgeva is used to treat a high calcium level due to cancer and to prevent bone fractures and other bone problems caused by multiple myeloma or cancer bone metastases. Xgeva is also used to treat giant cell tumor of the bone. This medicine may be used for other purposes; ask your health care provider or pharmacist if you have questions. COMMON BRAND NAME(S): Prolia, XGEVA What should I tell my health care provider before I take this medicine? They need to know if you have any of these conditions:  dental disease  having surgery or tooth extraction  infection  kidney disease  low levels of calcium or Vitamin D in the blood  malnutrition  on hemodialysis  skin conditions or sensitivity  thyroid or parathyroid disease  an unusual reaction to denosumab, other medicines, foods, dyes, or preservatives  pregnant or trying to get pregnant  breast-feeding How should I use this medicine? This medicine is for injection under the skin. It is given by a health care professional in a hospital or clinic setting. A special MedGuide will be given to you before each treatment. Be sure to read this information carefully each time. For Prolia, talk to your pediatrician regarding the use of this medicine in children. Special care may be needed. For Xgeva, talk to your pediatrician regarding the use of this medicine in children. While this drug may be prescribed for children as young as 13 years for selected conditions, precautions do apply. Overdosage: If you think you have taken too much of this medicine contact a poison control center or emergency room at once. NOTE: This medicine is only for you. Do not share this medicine with others. What if I miss a dose? It is  important not to miss your dose. Call your doctor or health care professional if you are unable to keep an appointment. What may interact with this medicine? Do not take this medicine with any of the following medications:  other medicines containing denosumab This medicine may also interact with the following medications:  medicines that lower your chance of fighting infection  steroid medicines like prednisone or cortisone This list may not describe all possible interactions. Give your health care provider a list of all the medicines, herbs, non-prescription drugs, or dietary supplements you use. Also tell them if you smoke, drink alcohol, or use illegal drugs. Some items may interact with your medicine. What should I watch for while using this medicine? Visit your doctor or health care professional for regular checks on your progress. Your doctor or health care professional may order blood tests and other tests to see how you are doing. Call your doctor or health care professional for advice if you get a fever, chills or sore throat, or other symptoms of a cold or flu. Do not treat yourself. This drug may decrease your body's ability to fight infection. Try to avoid being around people who are sick. You should make sure you get enough calcium and vitamin D while you are taking this medicine, unless your doctor tells you not to. Discuss the foods you eat and the vitamins you take with your health care professional. See your dentist regularly. Brush and floss your teeth as directed. Before you have any dental work done, tell your dentist you are   receiving this medicine. Do not become pregnant while taking this medicine or for 5 months after stopping it. Talk with your doctor or health care professional about your birth control options while taking this medicine. Women should inform their doctor if they wish to become pregnant or think they might be pregnant. There is a potential for serious side  effects to an unborn child. Talk to your health care professional or pharmacist for more information. What side effects may I notice from receiving this medicine? Side effects that you should report to your doctor or health care professional as soon as possible:  allergic reactions like skin rash, itching or hives, swelling of the face, lips, or tongue  bone pain  breathing problems  dizziness  jaw pain, especially after dental work  redness, blistering, peeling of the skin  signs and symptoms of infection like fever or chills; cough; sore throat; pain or trouble passing urine  signs of low calcium like fast heartbeat, muscle cramps or muscle pain; pain, tingling, numbness in the hands or feet; seizures  unusual bleeding or bruising  unusually weak or tired Side effects that usually do not require medical attention (report to your doctor or health care professional if they continue or are bothersome):  constipation  diarrhea  headache  joint pain  loss of appetite  muscle pain  runny nose  tiredness  upset stomach This list may not describe all possible side effects. Call your doctor for medical advice about side effects. You may report side effects to FDA at 1-800-FDA-1088. Where should I keep my medicine? This medicine is only given in a clinic, doctor's office, or other health care setting and will not be stored at home. NOTE: This sheet is a summary. It may not cover all possible information. If you have questions about this medicine, talk to your doctor, pharmacist, or health care provider.  2020 Elsevier/Gold Standard (2017-11-04 16:10:44)

## 2019-03-06 ENCOUNTER — Encounter: Payer: Self-pay | Admitting: Oncology

## 2019-03-06 DIAGNOSIS — M4150 Other secondary scoliosis, site unspecified: Secondary | ICD-10-CM | POA: Diagnosis not present

## 2019-03-06 DIAGNOSIS — M48062 Spinal stenosis, lumbar region with neurogenic claudication: Secondary | ICD-10-CM | POA: Diagnosis not present

## 2019-03-06 DIAGNOSIS — M4316 Spondylolisthesis, lumbar region: Secondary | ICD-10-CM | POA: Diagnosis not present

## 2019-03-08 DIAGNOSIS — D509 Iron deficiency anemia, unspecified: Secondary | ICD-10-CM | POA: Diagnosis not present

## 2019-03-09 ENCOUNTER — Encounter: Payer: Self-pay | Admitting: Oncology

## 2019-03-10 NOTE — Progress Notes (Signed)
[place]  [phone]  [phone]     Janet Rice DOB: 1945/10/01  MR#: [id number]  [id number]  Patient Care Team: McLean-Scocuzza, Nino Glow, MD as PCP - General (Internal Medicine)  CHIEF COMPLAINT: Iron deficiency anemia.  INTERVAL HISTORY: Patient returns to clinic today for repeat laboratory work, further evaluation, and consideration of additional IV Feraheme.  She feels improved since receiving an infusion 2 months ago, but still has weakness and fatigue and is not back to her baseline yet.  She otherwise feels well. She has no neurologic complaints. She denies any recent fevers or illnesses. She has a good appetite and denies weight loss.  She denies any chest pain, shortness of breath, cough, or hemoptysis.  She denies any nausea, vomiting, constipation, or diarrhea. She has no melena or hematochezia. She has no urinary complaints.  Patient offers no further specific complaints today.  REVIEW OF SYSTEMS:   Review of Systems  Constitutional: Positive for malaise/fatigue. Negative for fever and weight loss.  Eyes: Negative.   Respiratory: Negative.  Negative for cough and shortness of breath.   Cardiovascular: Negative.  Negative for chest pain and leg swelling.  Gastrointestinal: Negative.  Negative for abdominal pain, blood in stool and melena.  Genitourinary: Negative.  Negative for hematuria.  Musculoskeletal: Negative.  Negative for back pain and joint pain.  Skin: Negative.  Negative for rash.  Neurological: Positive for weakness. Negative for dizziness, sensory change and focal weakness.  Psychiatric/Behavioral: Negative.  The patient is not nervous/anxious.     As per HPI. Otherwise, a complete review of systems is negative.  ONCOLOGY HISTORY: Oncology History   No history exists.    PAST MEDICAL HISTORY: Past Medical History:  Diagnosis Date  . Adenomatous colon polyp   . Arthritis   . Childhood asthma    AS A  CHILD  . Chronic heartburn    On omeprazole  . Complication of anesthesia    HARD TO WAKE UP  . Dyspnea    low hgb. and iron  chronic problem  . External hemorrhoids   . Frequency of urination   . Frequent loose stools   . GERD (gastroesophageal reflux disease)   . Heme positive stool   . History of hiatal hernia   . Hyperlipidemia   . Hypothyroidism   . Iron deficiency anemia   . Melanoma ([place])    left thigh s/p LN removal left groin  . Metastatic melanoma ([place])    Followed by Dr Oliva Bustard, previous chemo, no recurrence, 2008?  . Multinodular goiter   . SCC (squamous cell carcinoma)    skin    PAST SURGICAL HISTORY: Past Surgical History:  Procedure Laterality Date  . ABDOMINAL HYSTERECTOMY  1984  . BACK SURGERY  03/2016   LUMBAR  . CARPAL TUNNEL RELEASE Right 09/07/2016   Procedure: CARPAL TUNNEL RELEASE;  Surgeon: Thornton Park, MD;  Location: [facility] ORS;  Service: Orthopedics;  Laterality: Right;  . CHOLECYSTECTOMY  1990  . COLONOSCOPY  08/2006   Four sessile polyps found and removed in sigmoid colon at splenic flexure and ascending colon. 7 mm in size. Another removed from transverse colon 4 mm. Path Report - showed tubular adenoma and hyperplastic polyp. Advised to repeat in 3.5 years (02/2010)  . COLONOSCOPY  3.2.2011   8 mm polyp in sigmoid colon, removed, 4 mm polyp in descending colon, removed. Internal hemorrhoids  . ESOPHAGOGASTRODUODENOSCOPY  3.2.2011   Large hiatia hernia, esophagus normal, mulitple small sessile polyps  w/no stigmata of recent bleeding found. Mildy erythermatous mucosa w/no bleeding found in gatric antrum. Normal duodenum. 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 PATH - no active inflammation, antral mucosa w/mild foveolar hyperplasia  . FRACTURE SURGERY    . Lymph Node Removal  12/2005   Left inguinal lymph node dissection  . MELANOMA EXCISION Left 1993   Left thigh  . ORIF ANKLE FRACTURE Right    Dr. Mack Guise  . TOTAL HIP  ARTHROPLASTY Right 11/18/2016   Procedure: TOTAL HIP ARTHROPLASTY;  Surgeon: Thornton Park, MD;  Location: [facility] ORS;  Service: Orthopedics;  Laterality: Right;    FAMILY HISTORY Family History  Problem Relation Age of Onset  . Aneurysm Mother   . Heart disease Mother   . Colon polyps Father   . Diabetes Father   . Dementia Father   . Stroke Father        TIA  . Leukemia Maternal Grandfather   . Skin cancer Paternal Grandfather   . Diabetes Other   . Colon polyps Other   . Colon cancer Neg Hx   . Breast cancer Neg Hx     GYNECOLOGIC HISTORY:  No LMP recorded. Patient has had a hysterectomy.     ADVANCED DIRECTIVES:    HEALTH MAINTENANCE: Social History   Tobacco Use  . Smoking status: Former Smoker    Packs/day: 0.50    Years: 8.00    Pack years: 4.00    Types: Cigarettes    Quit date: 07/12/1978    Years since quitting: 40.7  . Smokeless tobacco: Never Used  Substance Use Topics  . Alcohol use: No    Alcohol/week: 0.0 standard drinks  . Drug use: No    Allergies  Allergen Reactions  . Sulfa Antibiotics Rash    Current Outpatient Medications  Medication Sig Dispense Refill  . acetaminophen (TYLENOL 8 HOUR ARTHRITIS PAIN) 650 MG CR tablet Take 1,300 mg by mouth daily.     . Calcium Carb-Cholecalciferol (CALCIUM 600+D) 600-800 MG-UNIT TABS Take 1 tablet by mouth daily with lunch.    . citalopram (CELEXA) 20 MG tablet Take 1 tablet (20 mg total) by mouth daily. 90 tablet 3  . Ferrous Sulfate Dried (SLOW RELEASE IRON) 45 MG TBCR Take 45 mg by mouth daily with lunch.    . Iron-Vitamins (GERITOL) LIQD Take by mouth.    . levothyroxine (SYNTHROID, LEVOTHROID) 125 MCG tablet Take 1 tablet (125 mcg total) by mouth daily before breakfast. 90 tablet 3  . loperamide (IMODIUM) 2 MG capsule Take 2 mg by mouth every morning.    . Multiple Vitamins-Minerals (EYE VITAMINS & MINERALS) TABS Take 1 tablet by mouth daily.     . NON FORMULARY Apply 1 application topically 2  (two) times daily as needed (muscle pain). Horse Liniment    . pravastatin (PRAVACHOL) 40 MG tablet Take 0.5 tablets (20 mg total) by mouth daily. 45 tablet 3  . Propylene Glycol (SYSTANE BALANCE OP) Place 1 drop into both eyes daily as needed (dry eyes).    . TURMERIC PO Take 1 capsule by mouth daily at 12 noon.     No current facility-administered medications for this visit.     OBJECTIVE: BP 121/71   Pulse 75   Temp 97.6 F (36.4 C)   Resp 18   Wt 209 lb (94.8 kg)   BMI 38.23 kg/m    Body mass index is 38.23 kg/m.    ECOG FS:0 - Asymptomatic  General: Well-developed, well-nourished,  no acute distress. Eyes: Pink conjunctiva, anicteric sclera. HEENT: Normocephalic, moist mucous membranes. Lungs: Clear to auscultation bilaterally. Heart: Regular rate and rhythm. No rubs, murmurs, or gallops. Abdomen: Soft, nontender, nondistended. No organomegaly noted, normoactive bowel sounds. Musculoskeletal: No edema, cyanosis, or clubbing. Neuro: Alert, answering all questions appropriately. Cranial nerves grossly intact. Skin: No rashes or petechiae noted. Psych: Normal affect.   LAB RESULTS:  December 27, 2016: WBC 6.6, hemoglobin 10.4, platelet 144. Total iron 20, iron saturation 7%, ferritin 55. April 27, 2017: WBC 6.8, hemoglobin 11.3, platelets 151. Total iron 26, iron saturation 8%, ferritin 24. September 23, 2017: WBC 7.5, hemoglobin 10.9, platelet 165.  Total iron 18, iron saturation 5%, ferritin 21. December 22, 2017: WBC 6.9, hemoglobin 10.3, platelets 159.  Total iron 22, iron saturation 7%, ferritin 22. April 11, 2018: WBC 8.1, hemoglobin 11.5, platelets 190.  Total iron 25, iron saturation 7%, ferrin 24. September 14, 2018: WBC 8.1, hemoglobin 10.2, platelets 192.  Total iron 20, iron saturation 7%, ferritin 32.  CBC    Component Value Date/Time   WBC 6.4 12/31/2018 0611   RBC 2.89 (L) 12/31/2018 0611   HGB 8.8 (L) 12/31/2018 1535   HGB 7.5 (L) 05/29/2018 1410   HCT 29.9 (L)  12/31/2018 1535   HCT 24.2 (L) 05/29/2018 1410   PLT 151 12/31/2018 0611   PLT 170 05/29/2018 1410   MCV 86.9 12/31/2018 0611   MCV 80 05/29/2018 1410   MCV 74 (L) 12/12/2013 1015   MCH 25.3 (L) 12/31/2018 0611   MCHC 29.1 (L) 12/31/2018 0611   RDW 15.7 (H) 12/31/2018 0611   RDW 17.1 (H) 05/29/2018 1410   RDW 33.6 (H) 12/12/2013 1015   LYMPHSABS 1.1 12/30/2018 1125   LYMPHSABS 1.5 05/29/2018 1410   LYMPHSABS 1.2 12/12/2013 1015   MONOABS 0.4 12/30/2018 1125   MONOABS 0.4 12/12/2013 1015   EOSABS 0.1 12/30/2018 1125   EOSABS 0.1 05/29/2018 1410   EOSABS 0.1 12/12/2013 1015   BASOSABS 0.0 12/30/2018 1125   BASOSABS 0.0 05/29/2018 1410   BASOSABS 0.1 12/12/2013 1015   Lab Results  Component Value Date   IRON 25 (L) 08/31/2018   TIBC 280 08/31/2018   IRONPCTSAT 9 (LL) 08/31/2018   Lab Results  Component Value Date   FERRITIN 43 08/31/2018      STUDIES: No results found.  ASSESSMENT & PLAN:    1. Iron deficiency anemia: Patient reportedly had a normal colonoscopy in March 2016.  Patient's hemoglobin remains decreased, but improved to 9.6.  She continues to have a decreased total iron of 17 and iron saturation of 6%.  Ferritin is 39.  Proceed with 510 mg IV Feraheme today.  Patient will return to clinic in 1 week for second infusion and then in 2 months for repeat laboratory work, further evaluation, and continuation of treatment if needed.   2. History of melanoma: Melanoma of left thigh status post resection in 1993, exact stage is not known, metastatic to inguinal lymph node. No evidence of recurrent disease. 3. Back Pain: Chronic and unchanged.  Patient does not complain of this today.  Patient had surgery in September 2017.  4. Hypertension: Blood pressure continues to be within normal limits.  I spent a total of 30 minutes face-to-face with the patient of which greater than 50% of the visit was spent in counseling and coordination of care as detailed above.   Patient  expressed understanding and was in agreement with this plan. She also understands that She  can call clinic at any time with any questions, concerns, or complaints.    Lloyd Huger, MD   03/16/2019 9:15 AM

## 2019-03-15 ENCOUNTER — Inpatient Hospital Stay: Payer: Medicare Other | Attending: Oncology

## 2019-03-15 ENCOUNTER — Inpatient Hospital Stay: Payer: Medicare Other

## 2019-03-15 ENCOUNTER — Other Ambulatory Visit: Payer: Self-pay

## 2019-03-15 ENCOUNTER — Inpatient Hospital Stay (HOSPITAL_BASED_OUTPATIENT_CLINIC_OR_DEPARTMENT_OTHER): Payer: Medicare Other | Admitting: Oncology

## 2019-03-15 VITALS — BP 104/61 | HR 73 | Resp 18

## 2019-03-15 VITALS — BP 121/71 | HR 75 | Temp 97.6°F | Resp 18 | Wt 209.0 lb

## 2019-03-15 DIAGNOSIS — D509 Iron deficiency anemia, unspecified: Secondary | ICD-10-CM

## 2019-03-15 DIAGNOSIS — D508 Other iron deficiency anemias: Secondary | ICD-10-CM

## 2019-03-15 DIAGNOSIS — Z85820 Personal history of malignant melanoma of skin: Secondary | ICD-10-CM | POA: Diagnosis not present

## 2019-03-15 DIAGNOSIS — G8929 Other chronic pain: Secondary | ICD-10-CM | POA: Insufficient documentation

## 2019-03-15 DIAGNOSIS — M549 Dorsalgia, unspecified: Secondary | ICD-10-CM | POA: Insufficient documentation

## 2019-03-15 MED ORDER — SODIUM CHLORIDE 0.9 % IV SOLN
510.0000 mg | Freq: Once | INTRAVENOUS | Status: AC
  Administered 2019-03-15: 510 mg via INTRAVENOUS
  Filled 2019-03-15: qty 17

## 2019-03-15 MED ORDER — SODIUM CHLORIDE 0.9 % IV SOLN
INTRAVENOUS | Status: DC
  Administered 2019-03-15: 15:00:00 via INTRAVENOUS
  Filled 2019-03-15: qty 250

## 2019-03-15 NOTE — Progress Notes (Signed)
Patient reports that she is feeling fatigued.

## 2019-03-29 ENCOUNTER — Inpatient Hospital Stay: Payer: Medicare Other

## 2019-03-29 ENCOUNTER — Other Ambulatory Visit: Payer: Self-pay

## 2019-03-29 VITALS — BP 100/54 | HR 66 | Temp 97.0°F | Resp 17

## 2019-03-29 DIAGNOSIS — G8929 Other chronic pain: Secondary | ICD-10-CM | POA: Diagnosis not present

## 2019-03-29 DIAGNOSIS — D508 Other iron deficiency anemias: Secondary | ICD-10-CM

## 2019-03-29 DIAGNOSIS — D509 Iron deficiency anemia, unspecified: Secondary | ICD-10-CM | POA: Diagnosis not present

## 2019-03-29 DIAGNOSIS — M549 Dorsalgia, unspecified: Secondary | ICD-10-CM | POA: Diagnosis not present

## 2019-03-29 DIAGNOSIS — Z85820 Personal history of malignant melanoma of skin: Secondary | ICD-10-CM | POA: Diagnosis not present

## 2019-03-29 MED ORDER — SODIUM CHLORIDE 0.9 % IV SOLN
510.0000 mg | Freq: Once | INTRAVENOUS | Status: AC
  Administered 2019-03-29: 510 mg via INTRAVENOUS
  Filled 2019-03-29: qty 17

## 2019-03-29 MED ORDER — SODIUM CHLORIDE 0.9 % IV SOLN
Freq: Once | INTRAVENOUS | Status: AC
  Administered 2019-03-29: 14:00:00 via INTRAVENOUS
  Filled 2019-03-29: qty 250

## 2019-03-29 NOTE — Progress Notes (Signed)
1442: Pt tolerated infusion well. Pt monitored 30 minutes post infusion. PT and VS stable at discharge.

## 2019-03-30 DIAGNOSIS — M1712 Unilateral primary osteoarthritis, left knee: Secondary | ICD-10-CM | POA: Diagnosis not present

## 2019-04-26 ENCOUNTER — Telehealth: Payer: Self-pay

## 2019-04-26 DIAGNOSIS — Z23 Encounter for immunization: Secondary | ICD-10-CM | POA: Diagnosis not present

## 2019-04-26 NOTE — Telephone Encounter (Signed)
Copied from [place] [id number]. Topic: Appointment Scheduling - Scheduling Inquiry for Clinic >> Apr 26, 2019  2:05 PM Janet Rice wrote: Reason for CRM: Pt called to schedule flu shot for tomorrow 04/27/19. No answer on scheduling line. Please return call.

## 2019-05-07 ENCOUNTER — Encounter: Payer: Self-pay | Admitting: Oncology

## 2019-05-09 DIAGNOSIS — D509 Iron deficiency anemia, unspecified: Secondary | ICD-10-CM | POA: Diagnosis not present

## 2019-05-10 ENCOUNTER — Encounter: Payer: Self-pay | Admitting: Oncology

## 2019-05-11 ENCOUNTER — Encounter: Payer: Self-pay | Admitting: Oncology

## 2019-05-11 ENCOUNTER — Other Ambulatory Visit: Payer: Self-pay

## 2019-05-11 NOTE — Progress Notes (Signed)
Called patient no answer left message  

## 2019-05-11 NOTE — Progress Notes (Signed)
Patient prescreened for appointment. Patient has no concerns or questions.  

## 2019-05-13 NOTE — Progress Notes (Signed)
[place]  [phone]  Fax:(336) [phone]     Janet Rice DOB: May 19, 1946  MR#: [id number]  [id number]  Patient Care Team: McLean-Scocuzza, Nino Glow, MD as PCP - General (Internal Medicine)  CHIEF COMPLAINT: Iron deficiency anemia.  INTERVAL HISTORY: Patient returns to clinic today for repeat laboratory work, further evaluation, and consideration of additional IV Feraheme.  She continues to have increased weakness and fatigue, but admits this continues to improve particularly after receiving treatment 2 months ago.  She otherwise feels well. She has no neurologic complaints. She denies any recent fevers or illnesses. She has a good appetite and denies weight loss.  She denies any chest pain, shortness of breath, cough, or hemoptysis.  She denies any nausea, vomiting, constipation, or diarrhea. She has no melena or hematochezia. She has no urinary complaints.  Patient offers no further specific complaints today.  REVIEW OF SYSTEMS:   Review of Systems  Constitutional: Positive for malaise/fatigue. Negative for fever and weight loss.  Eyes: Negative.   Respiratory: Negative.  Negative for cough and shortness of breath.   Cardiovascular: Negative.  Negative for chest pain and leg swelling.  Gastrointestinal: Negative.  Negative for abdominal pain, blood in stool and melena.  Genitourinary: Negative.  Negative for hematuria.  Musculoskeletal: Negative.  Negative for back pain and joint pain.  Skin: Negative.  Negative for rash.  Neurological: Positive for weakness. Negative for dizziness, sensory change and focal weakness.  Psychiatric/Behavioral: Negative.  The patient is not nervous/anxious.     As per HPI. Otherwise, a complete review of systems is negative.  ONCOLOGY HISTORY: Oncology History   No history exists.    PAST MEDICAL HISTORY: Past Medical History:  Diagnosis Date  . Adenomatous colon polyp   . Arthritis   . Childhood asthma     AS A CHILD  . Chronic heartburn    On omeprazole  . Complication of anesthesia    HARD TO WAKE UP  . Dyspnea    low hgb. and iron  chronic problem  . External hemorrhoids   . Frequency of urination   . Frequent loose stools   . GERD (gastroesophageal reflux disease)   . Heme positive stool   . History of hiatal hernia   . Hyperlipidemia   . Hypothyroidism   . Iron deficiency anemia   . Melanoma ([place])    left thigh s/p LN removal left groin  . Metastatic melanoma ([place])    Followed by Dr Oliva Bustard, previous chemo, no recurrence, 2008?  . Multinodular goiter   . SCC (squamous cell carcinoma)    skin    PAST SURGICAL HISTORY: Past Surgical History:  Procedure Laterality Date  . ABDOMINAL HYSTERECTOMY  1984  . BACK SURGERY  03/2016   LUMBAR  . CARPAL TUNNEL RELEASE Right 09/07/2016   Procedure: CARPAL TUNNEL RELEASE;  Surgeon: Thornton Park, MD;  Location: [facility] ORS;  Service: Orthopedics;  Laterality: Right;  . CHOLECYSTECTOMY  1990  . COLONOSCOPY  08/2006   Four sessile polyps found and removed in sigmoid colon at splenic flexure and ascending colon. 7 mm in size. Another removed from transverse colon 4 mm. Path Report - showed tubular adenoma and hyperplastic polyp. Advised to repeat in 3.5 years (02/2010)  . COLONOSCOPY  3.2.2011   8 mm polyp in sigmoid colon, removed, 4 mm polyp in descending colon, removed. Internal hemorrhoids  . ESOPHAGOGASTRODUODENOSCOPY  3.2.2011   Large hiatia hernia, esophagus normal, mulitple small sessile polyps w/no stigmata of  recent bleeding found. Mildy erythermatous mucosa w/no bleeding found in gatric antrum. Normal duodenum. 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 PATH - no active inflammation, antral mucosa w/mild foveolar hyperplasia  . FRACTURE SURGERY    . Lymph Node Removal  12/2005   Left inguinal lymph node dissection  . MELANOMA EXCISION Left 1993   Left thigh  . ORIF ANKLE FRACTURE Right    Dr. Mack Guise  . TOTAL HIP  ARTHROPLASTY Right 11/18/2016   Procedure: TOTAL HIP ARTHROPLASTY;  Surgeon: Thornton Park, MD;  Location: [facility] ORS;  Service: Orthopedics;  Laterality: Right;    FAMILY HISTORY Family History  Problem Relation Age of Onset  . Aneurysm Mother   . Heart disease Mother   . Colon polyps Father   . Diabetes Father   . Dementia Father   . Stroke Father        TIA  . Leukemia Maternal Grandfather   . Skin cancer Paternal Grandfather   . Diabetes Other   . Colon polyps Other   . Colon cancer Neg Hx   . Breast cancer Neg Hx     GYNECOLOGIC HISTORY:  No LMP recorded. Patient has had a hysterectomy.     ADVANCED DIRECTIVES:    HEALTH MAINTENANCE: Social History   Tobacco Use  . Smoking status: Former Smoker    Packs/day: 0.50    Years: 8.00    Pack years: 4.00    Types: Cigarettes    Quit date: 07/12/1978    Years since quitting: 40.8  . Smokeless tobacco: Never Used  Substance Use Topics  . Alcohol use: No    Alcohol/week: 0.0 standard drinks  . Drug use: No    Allergies  Allergen Reactions  . Sulfa Antibiotics Rash    Current Outpatient Medications  Medication Sig Dispense Refill  . acetaminophen (TYLENOL 8 HOUR ARTHRITIS PAIN) 650 MG CR tablet Take 1,300 mg by mouth daily.     . Calcium Carb-Cholecalciferol (CALCIUM 600+D) 600-800 MG-UNIT TABS Take 1 tablet by mouth daily with lunch.    . citalopram (CELEXA) 20 MG tablet Take 1 tablet (20 mg total) by mouth daily. 90 tablet 3  . Ferrous Sulfate Dried (SLOW RELEASE IRON) 45 MG TBCR Take 45 mg by mouth daily with lunch.    . Iron-Vitamins (GERITOL) LIQD Take by mouth.    . levothyroxine (SYNTHROID, LEVOTHROID) 125 MCG tablet Take 1 tablet (125 mcg total) by mouth daily before breakfast. 90 tablet 3  . loperamide (IMODIUM) 2 MG capsule Take 2 mg by mouth every morning.    . Multiple Vitamins-Minerals (EYE VITAMINS & MINERALS) TABS Take 1 tablet by mouth daily.     . NON FORMULARY Apply 1 application topically 2  (two) times daily as needed (muscle pain). Horse Liniment    . pravastatin (PRAVACHOL) 40 MG tablet Take 0.5 tablets (20 mg total) by mouth daily. 45 tablet 3  . Propylene Glycol (SYSTANE BALANCE OP) Place 1 drop into both eyes daily as needed (dry eyes).    . TURMERIC PO Take 1 capsule by mouth daily at 12 noon.     No current facility-administered medications for this visit.    Facility-Administered Medications Ordered in Other Visits  Medication Dose Route Frequency Provider Last Rate Last Dose  . 0.9 %  sodium chloride infusion   Intravenous Continuous Lloyd Huger, MD 10 mL/hr at 05/14/19 1430      OBJECTIVE: BP 123/68 (BP Location: Left Arm, Patient Position: Sitting)  Pulse 83   Temp 98.3 F (36.8 C) (Temporal)   Resp 16   Wt 208 lb 12.8 oz (94.7 kg)   SpO2 96%   BMI 38.19 kg/m    Body mass index is 38.19 kg/m.    ECOG FS:0 - Asymptomatic  General: Well-developed, well-nourished, no acute distress. Eyes: Pink conjunctiva, anicteric sclera. HEENT: Normocephalic, moist mucous membranes. Lungs: Clear to auscultation bilaterally. Heart: Regular rate and rhythm. No rubs, murmurs, or gallops. Abdomen: Soft, nontender, nondistended. No organomegaly noted, normoactive bowel sounds. Musculoskeletal: No edema, cyanosis, or clubbing. Neuro: Alert, answering all questions appropriately. Cranial nerves grossly intact. Skin: No rashes or petechiae noted. Psych: Normal affect.  LAB RESULTS:  December 27, 2016: WBC 6.6, hemoglobin 10.4, platelet 144. Total iron 20, iron saturation 7%, ferritin 55. April 27, 2017: WBC 6.8, hemoglobin 11.3, platelets 151. Total iron 26, iron saturation 8%, ferritin 24. September 23, 2017: WBC 7.5, hemoglobin 10.9, platelet 165.  Total iron 18, iron saturation 5%, ferritin 21. December 22, 2017: WBC 6.9, hemoglobin 10.3, platelets 159.  Total iron 22, iron saturation 7%, ferritin 22. April 11, 2018: WBC 8.1, hemoglobin 11.5, platelets 190.  Total iron 25,  iron saturation 7%, ferrin 24. September 14, 2018: WBC 8.1, hemoglobin 10.2, platelets 192.  Total iron 20, iron saturation 7%, ferritin 32.  CBC    Component Value Date/Time   WBC 6.4 12/31/2018 0611   RBC 2.89 (L) 12/31/2018 0611   HGB 8.8 (L) 12/31/2018 1535   HGB 7.5 (L) 05/29/2018 1410   HCT 29.9 (L) 12/31/2018 1535   HCT 24.2 (L) 05/29/2018 1410   PLT 151 12/31/2018 0611   PLT 170 05/29/2018 1410   MCV 86.9 12/31/2018 0611   MCV 80 05/29/2018 1410   MCV 74 (L) 12/12/2013 1015   MCH 25.3 (L) 12/31/2018 0611   MCHC 29.1 (L) 12/31/2018 0611   RDW 15.7 (H) 12/31/2018 0611   RDW 17.1 (H) 05/29/2018 1410   RDW 33.6 (H) 12/12/2013 1015   LYMPHSABS 1.1 12/30/2018 1125   LYMPHSABS 1.5 05/29/2018 1410   LYMPHSABS 1.2 12/12/2013 1015   MONOABS 0.4 12/30/2018 1125   MONOABS 0.4 12/12/2013 1015   EOSABS 0.1 12/30/2018 1125   EOSABS 0.1 05/29/2018 1410   EOSABS 0.1 12/12/2013 1015   BASOSABS 0.0 12/30/2018 1125   BASOSABS 0.0 05/29/2018 1410   BASOSABS 0.1 12/12/2013 1015   Lab Results  Component Value Date   IRON 25 (L) 08/31/2018   TIBC 280 08/31/2018   IRONPCTSAT 9 (LL) 08/31/2018   Lab Results  Component Value Date   FERRITIN 43 08/31/2018      STUDIES: No results found.  ASSESSMENT & PLAN:    1. Iron deficiency anemia: Patient reportedly had a normal colonoscopy in March 2016.  Patient's hemoglobin continues to slowly improve and is now 10.1.  Despite this, her iron saturation remains 8% with a total iron of 20 and a serum ferritin of 45.  She also continues to be mildly symptomatic.  Proceed with 510 mg IV Feraheme today.  Return to clinic in 1 week for a second infusion.  Patient will then return to clinic in 3 months with repeat laboratory work, further evaluation, and continuation of treatment if needed.  If patient continues to require treatment, will consider referral back to GI for repeat colonoscopy.    2. History of melanoma: Melanoma of left thigh status post  resection in 1993, exact stage is not known, metastatic to inguinal lymph node. No evidence of  recurrent disease. 3. Back Pain: Chronic and unchanged.  Patient does not complain of this today.  Patient had surgery in September 2017.  4. Hypertension: Blood pressure continues to be within normal limits.  I spent a total of 30 minutes face-to-face with the patient of which greater than 50% of the visit was spent in counseling and coordination of care as detailed above.   Patient expressed understanding and was in agreement with this plan. She also understands that She can call clinic at any time with any questions, concerns, or complaints.    Lloyd Huger, MD   05/14/2019 3:21 PM

## 2019-05-14 ENCOUNTER — Other Ambulatory Visit: Payer: Self-pay

## 2019-05-14 ENCOUNTER — Inpatient Hospital Stay: Payer: Medicare Other | Attending: Oncology | Admitting: Oncology

## 2019-05-14 ENCOUNTER — Inpatient Hospital Stay: Payer: Medicare Other

## 2019-05-14 VITALS — BP 123/68 | HR 83 | Temp 98.3°F | Resp 16 | Wt 208.8 lb

## 2019-05-14 VITALS — BP 113/67 | HR 71 | Temp 98.2°F | Resp 18

## 2019-05-14 DIAGNOSIS — E039 Hypothyroidism, unspecified: Secondary | ICD-10-CM | POA: Insufficient documentation

## 2019-05-14 DIAGNOSIS — M549 Dorsalgia, unspecified: Secondary | ICD-10-CM | POA: Diagnosis not present

## 2019-05-14 DIAGNOSIS — G8929 Other chronic pain: Secondary | ICD-10-CM | POA: Diagnosis not present

## 2019-05-14 DIAGNOSIS — D509 Iron deficiency anemia, unspecified: Secondary | ICD-10-CM | POA: Diagnosis not present

## 2019-05-14 DIAGNOSIS — Z85820 Personal history of malignant melanoma of skin: Secondary | ICD-10-CM | POA: Diagnosis not present

## 2019-05-14 DIAGNOSIS — I10 Essential (primary) hypertension: Secondary | ICD-10-CM | POA: Diagnosis not present

## 2019-05-14 DIAGNOSIS — D508 Other iron deficiency anemias: Secondary | ICD-10-CM

## 2019-05-14 MED ORDER — SODIUM CHLORIDE 0.9 % IV SOLN
INTRAVENOUS | Status: DC
  Administered 2019-05-14: 15:00:00 via INTRAVENOUS
  Filled 2019-05-14: qty 250

## 2019-05-14 MED ORDER — SODIUM CHLORIDE 0.9 % IV SOLN
510.0000 mg | Freq: Once | INTRAVENOUS | Status: AC
  Administered 2019-05-14: 510 mg via INTRAVENOUS
  Filled 2019-05-14: qty 17

## 2019-05-21 ENCOUNTER — Inpatient Hospital Stay: Payer: Medicare Other

## 2019-05-21 ENCOUNTER — Other Ambulatory Visit: Payer: Self-pay

## 2019-05-21 VITALS — BP 132/73 | HR 89 | Temp 96.4°F | Resp 20

## 2019-05-21 DIAGNOSIS — I10 Essential (primary) hypertension: Secondary | ICD-10-CM | POA: Diagnosis not present

## 2019-05-21 DIAGNOSIS — Z85820 Personal history of malignant melanoma of skin: Secondary | ICD-10-CM | POA: Diagnosis not present

## 2019-05-21 DIAGNOSIS — D508 Other iron deficiency anemias: Secondary | ICD-10-CM

## 2019-05-21 DIAGNOSIS — D509 Iron deficiency anemia, unspecified: Secondary | ICD-10-CM | POA: Diagnosis not present

## 2019-05-21 DIAGNOSIS — G8929 Other chronic pain: Secondary | ICD-10-CM | POA: Diagnosis not present

## 2019-05-21 DIAGNOSIS — E039 Hypothyroidism, unspecified: Secondary | ICD-10-CM | POA: Diagnosis not present

## 2019-05-21 DIAGNOSIS — M549 Dorsalgia, unspecified: Secondary | ICD-10-CM | POA: Diagnosis not present

## 2019-05-21 MED ORDER — SODIUM CHLORIDE 0.9 % IV SOLN
510.0000 mg | Freq: Once | INTRAVENOUS | Status: AC
  Administered 2019-05-21: 510 mg via INTRAVENOUS
  Filled 2019-05-21: qty 17

## 2019-05-21 MED ORDER — SODIUM CHLORIDE 0.9 % IV SOLN
INTRAVENOUS | Status: DC
  Administered 2019-05-21: 14:00:00 via INTRAVENOUS
  Filled 2019-05-21: qty 250

## 2019-07-27 ENCOUNTER — Encounter: Payer: Self-pay | Admitting: Internal Medicine

## 2019-07-29 ENCOUNTER — Other Ambulatory Visit: Payer: Self-pay | Admitting: Internal Medicine

## 2019-07-29 DIAGNOSIS — F329 Major depressive disorder, single episode, unspecified: Secondary | ICD-10-CM

## 2019-07-29 DIAGNOSIS — E785 Hyperlipidemia, unspecified: Secondary | ICD-10-CM

## 2019-07-29 DIAGNOSIS — F419 Anxiety disorder, unspecified: Secondary | ICD-10-CM

## 2019-07-29 DIAGNOSIS — F32A Depression, unspecified: Secondary | ICD-10-CM

## 2019-07-29 MED ORDER — PRAVASTATIN SODIUM 40 MG PO TABS: 20.0000 mg | ORAL_TABLET | Freq: Every day | ORAL | 3 refills | Status: DC

## 2019-07-29 MED ORDER — CITALOPRAM HYDROBROMIDE 20 MG PO TABS: 20.0000 mg | ORAL_TABLET | Freq: Every day | ORAL | 3 refills | Status: DC

## 2019-08-03 DIAGNOSIS — Z23 Encounter for immunization: Secondary | ICD-10-CM | POA: Diagnosis not present

## 2019-08-09 ENCOUNTER — Telehealth: Payer: Self-pay | Admitting: *Deleted

## 2019-08-09 NOTE — Telephone Encounter (Signed)
Patient called and states she is to have labs drawn at [facility] prior to her appointment Monday and wants to be sure the order has been sent to [facility] for her lab draw.

## 2019-08-09 NOTE — Telephone Encounter (Signed)
I think so, but she will need cbc, ferritin, iron&IBC.

## 2019-08-09 NOTE — Telephone Encounter (Signed)
I sent the orders to [facility] and called the patient to let her know

## 2019-08-10 DIAGNOSIS — M25673 Stiffness of unspecified ankle, not elsewhere classified: Secondary | ICD-10-CM | POA: Insufficient documentation

## 2019-08-10 DIAGNOSIS — R29898 Other symptoms and signs involving the musculoskeletal system: Secondary | ICD-10-CM | POA: Insufficient documentation

## 2019-08-10 DIAGNOSIS — D509 Iron deficiency anemia, unspecified: Secondary | ICD-10-CM | POA: Diagnosis not present

## 2019-08-10 DIAGNOSIS — R269 Unspecified abnormalities of gait and mobility: Secondary | ICD-10-CM | POA: Insufficient documentation

## 2019-08-10 DIAGNOSIS — S82853A Displaced trimalleolar fracture of unspecified lower leg, initial encounter for closed fracture: Secondary | ICD-10-CM | POA: Insufficient documentation

## 2019-08-10 NOTE — Progress Notes (Signed)
[place]  [phone]  [phone]     Janet Rice DOB: 1945-10-09  MR#: [id number]  [id number]  Patient Care Team: McLean-Scocuzza, Nino Glow, MD as PCP - General (Internal Medicine)  CHIEF COMPLAINT: Iron deficiency anemia.  INTERVAL HISTORY: Patient returns to clinic today for further evaluation and consideration of additional IV iron.  She feels significantly improved after receiving treatment approximately 3 to 4 months ago.  She does not complain of weakness or fatigue today. She has no neurologic complaints. She denies any recent fevers or illnesses. She has a good appetite and denies weight loss.  She denies any chest pain, shortness of breath, cough, or hemoptysis.  She denies any nausea, vomiting, constipation, or diarrhea. She has no melena or hematochezia. She has no urinary complaints.  Patient offers no specific complaints today.  REVIEW OF SYSTEMS:   Review of Systems  Constitutional: Negative.  Negative for fever, malaise/fatigue and weight loss.  Eyes: Negative.   Respiratory: Negative.  Negative for cough and shortness of breath.   Cardiovascular: Negative.  Negative for chest pain and leg swelling.  Gastrointestinal: Negative.  Negative for abdominal pain, blood in stool and melena.  Genitourinary: Negative.  Negative for hematuria.  Musculoskeletal: Negative.  Negative for back pain and joint pain.  Skin: Negative.  Negative for rash.  Neurological: Negative.  Negative for dizziness, sensory change, focal weakness and weakness.  Psychiatric/Behavioral: Negative.  The patient is not nervous/anxious.     As per HPI. Otherwise, a complete review of systems is negative.  ONCOLOGY HISTORY: Oncology History   No history exists.    PAST MEDICAL HISTORY: Past Medical History:  Diagnosis Date  . Adenomatous colon polyp   . Arthritis   . Childhood asthma    AS A CHILD  . Chronic heartburn    On omeprazole  .  Complication of anesthesia    HARD TO WAKE UP  . Dyspnea    low hgb. and iron  chronic problem  . External hemorrhoids   . Frequency of urination   . Frequent loose stools   . GERD (gastroesophageal reflux disease)   . Heme positive stool   . History of hiatal hernia   . Hyperlipidemia   . Hypothyroidism   . Iron deficiency anemia   . Melanoma ([place])    left thigh s/p LN removal left groin  . Metastatic melanoma ([place])    Followed by Dr Oliva Bustard, previous chemo, no recurrence, 2008?  . Multinodular goiter   . SCC (squamous cell carcinoma)    skin    PAST SURGICAL HISTORY: Past Surgical History:  Procedure Laterality Date  . ABDOMINAL HYSTERECTOMY  1984  . BACK SURGERY  03/2016   LUMBAR  . CARPAL TUNNEL RELEASE Right 09/07/2016   Procedure: CARPAL TUNNEL RELEASE;  Surgeon: Thornton Park, MD;  Location: [facility] ORS;  Service: Orthopedics;  Laterality: Right;  . CHOLECYSTECTOMY  1990  . COLONOSCOPY  08/2006   Four sessile polyps found and removed in sigmoid colon at splenic flexure and ascending colon. 7 mm in size. Another removed from transverse colon 4 mm. Path Report - showed tubular adenoma and hyperplastic polyp. Advised to repeat in 3.5 years (02/2010)  . COLONOSCOPY  3.2.2011   8 mm polyp in sigmoid colon, removed, 4 mm polyp in descending colon, removed. Internal hemorrhoids  . ESOPHAGOGASTRODUODENOSCOPY  3.2.2011   Large hiatia hernia, esophagus normal, mulitple small sessile polyps w/no stigmata of recent bleeding found. Mildy erythermatous mucosa w/no  bleeding found in gatric antrum. Normal duodenum. 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 PATH - no active inflammation, antral mucosa w/mild foveolar hyperplasia  . FRACTURE SURGERY    . Lymph Node Removal  12/2005   Left inguinal lymph node dissection  . MELANOMA EXCISION Left 1993   Left thigh  . ORIF ANKLE FRACTURE Right    Dr. Mack Guise  . TOTAL HIP ARTHROPLASTY Right 11/18/2016   Procedure: TOTAL HIP  ARTHROPLASTY;  Surgeon: Thornton Park, MD;  Location: [facility] ORS;  Service: Orthopedics;  Laterality: Right;    FAMILY HISTORY Family History  Problem Relation Age of Onset  . Aneurysm Mother   . Heart disease Mother   . Colon polyps Father   . Diabetes Father   . Dementia Father   . Stroke Father        TIA  . Leukemia Maternal Grandfather   . Skin cancer Paternal Grandfather   . Diabetes Other   . Colon polyps Other   . Colon cancer Neg Hx   . Breast cancer Neg Hx     GYNECOLOGIC HISTORY:  No LMP recorded. Patient has had a hysterectomy.     ADVANCED DIRECTIVES:    HEALTH MAINTENANCE: Social History   Tobacco Use  . Smoking status: Former Smoker    Packs/day: 0.50    Years: 8.00    Pack years: 4.00    Types: Cigarettes    Quit date: 07/12/1978    Years since quitting: 41.1  . Smokeless tobacco: Never Used  Substance Use Topics  . Alcohol use: No    Alcohol/week: 0.0 standard drinks  . Drug use: No    Allergies  Allergen Reactions  . Sulfa Antibiotics Rash    Current Outpatient Medications  Medication Sig Dispense Refill  . acetaminophen (TYLENOL 8 HOUR ARTHRITIS PAIN) 650 MG CR tablet Take 1,300 mg by mouth daily.     . Calcium Carb-Cholecalciferol (CALCIUM 600+D) 600-800 MG-UNIT TABS Take 1 tablet by mouth daily with lunch.    . Calcium Carbonate-Vitamin D 600-400 MG-UNIT tablet Take by mouth.    . celecoxib (CELEBREX) 200 MG capsule     . citalopram (CELEXA) 20 MG tablet Take 1 tablet (20 mg total) by mouth daily. 90 tablet 3  . Coenzyme Q10 50 MG CAPS Take by mouth.    . Ferrous Sulfate Dried (SLOW RELEASE IRON) 45 MG TBCR Take 45 mg by mouth daily with lunch.    . Iron-Vitamins (GERITOL) LIQD Take by mouth.    . levothyroxine (SYNTHROID, LEVOTHROID) 125 MCG tablet Take 1 tablet (125 mcg total) by mouth daily before breakfast. 90 tablet 3  . loperamide (IMODIUM) 2 MG capsule Take 2 mg by mouth every morning.    . Multiple Vitamins-Minerals (EYE  VITAMINS & MINERALS) TABS Take 1 tablet by mouth daily.     . NON FORMULARY Apply 1 application topically 2 (two) times daily as needed (muscle pain). Horse Liniment    . pravastatin (PRAVACHOL) 40 MG tablet Take 0.5 tablets (20 mg total) by mouth daily. 45 tablet 3  . Propylene Glycol (SYSTANE BALANCE OP) Place 1 drop into both eyes daily as needed (dry eyes).    . TURMERIC PO Take 1 capsule by mouth daily at 12 noon.     No current facility-administered medications for this visit.   Facility-Administered Medications Ordered in Other Visits  Medication Dose Route Frequency Provider Last Rate Last Admin  . 0.9 %  sodium chloride infusion   Intravenous  Continuous Lloyd Huger, MD   Stopped at 08/13/19 1530    OBJECTIVE: BP 122/67 (BP Location: Left Arm, Patient Position: Sitting)   Pulse 89   Temp 98.3 F (36.8 C) (Tympanic)   Resp 18   Wt 211 lb (95.7 kg)   BMI 38.59 kg/m    Body mass index is 38.59 kg/m.    ECOG FS:0 - Asymptomatic  General: Well-developed, well-nourished, no acute distress. Eyes: Pink conjunctiva, anicteric sclera. HEENT: Normocephalic, moist mucous membranes. Lungs: No audible wheezing or coughing. Heart: Regular rate and rhythm. Abdomen: Soft, nontender, no obvious distention. Musculoskeletal: No edema, cyanosis, or clubbing. Neuro: Alert, answering all questions appropriately. Cranial nerves grossly intact. Skin: No rashes or petechiae noted. Psych: Normal affect.   LAB RESULTS:  December 27, 2016: WBC 6.6, hemoglobin 10.4, platelet 144. Total iron 20, iron saturation 7%, ferritin 55. April 27, 2017: WBC 6.8, hemoglobin 11.3, platelets 151. Total iron 26, iron saturation 8%, ferritin 24. September 23, 2017: WBC 7.5, hemoglobin 10.9, platelet 165.  Total iron 18, iron saturation 5%, ferritin 21. December 22, 2017: WBC 6.9, hemoglobin 10.3, platelets 159.  Total iron 22, iron saturation 7%, ferritin 22. April 11, 2018: WBC 8.1, hemoglobin 11.5, platelets  190.  Total iron 25, iron saturation 7%, ferrin 24. September 14, 2018: WBC 8.1, hemoglobin 10.2, platelets 192.  Total iron 20, iron saturation 7%, ferritin 32.  CBC    Component Value Date/Time   WBC 6.4 12/31/2018 0611   RBC 2.89 (L) 12/31/2018 0611   HGB 8.8 (L) 12/31/2018 1535   HGB 7.5 (L) 05/29/2018 1410   HCT 29.9 (L) 12/31/2018 1535   HCT 24.2 (L) 05/29/2018 1410   PLT 151 12/31/2018 0611   PLT 170 05/29/2018 1410   MCV 86.9 12/31/2018 0611   MCV 80 05/29/2018 1410   MCV 74 (L) 12/12/2013 1015   MCH 25.3 (L) 12/31/2018 0611   MCHC 29.1 (L) 12/31/2018 0611   RDW 15.7 (H) 12/31/2018 0611   RDW 17.1 (H) 05/29/2018 1410   RDW 33.6 (H) 12/12/2013 1015   LYMPHSABS 1.1 12/30/2018 1125   LYMPHSABS 1.5 05/29/2018 1410   LYMPHSABS 1.2 12/12/2013 1015   MONOABS 0.4 12/30/2018 1125   MONOABS 0.4 12/12/2013 1015   EOSABS 0.1 12/30/2018 1125   EOSABS 0.1 05/29/2018 1410   EOSABS 0.1 12/12/2013 1015   BASOSABS 0.0 12/30/2018 1125   BASOSABS 0.0 05/29/2018 1410   BASOSABS 0.1 12/12/2013 1015   Lab Results  Component Value Date   IRON 25 (L) 08/31/2018   TIBC 280 08/31/2018   IRONPCTSAT 9 (LL) 08/31/2018   Lab Results  Component Value Date   FERRITIN 43 08/31/2018      STUDIES: No results found.  ASSESSMENT & PLAN:    1. Iron deficiency anemia: Patient reportedly had a normal colonoscopy in March 2016.  Patient's hemoglobin continues to improve and is now 11.5.  Despite this, her iron saturation and ferritin remain decreased.  We will proceed with 1 additional dose of 510 mg IV Feraheme today.  She does not require a second infusion.  Return to clinic in 4 months with repeat laboratory work at [facility] followed by further evaluation and consideration of additional IV iron.  If patient continues to require treatment, will consider referral back to GI for repeat colonoscopy.    2. History of melanoma: Melanoma of left thigh status post resection in 1993, exact stage is not known,  metastatic to inguinal lymph node. No evidence of recurrent disease.  3. Back Pain: Chronic and unchanged.  Patient does not complain of this today.  Patient had surgery in September 2017.  4. Hypertension: Blood pressure continues to be within normal limits.  I spent a total of 30 minutes reviewing chart data, face-to-face evaluation with the patient, counseling and coordination of care as detailed above.   Patient expressed understanding and was in agreement with this plan. She also understands that She can call clinic at any time with any questions, concerns, or complaints.    Lloyd Huger, MD   08/13/2019 4:09 PM

## 2019-08-13 ENCOUNTER — Inpatient Hospital Stay: Payer: Medicare Other | Attending: Oncology | Admitting: Oncology

## 2019-08-13 ENCOUNTER — Other Ambulatory Visit: Payer: Self-pay

## 2019-08-13 ENCOUNTER — Encounter: Payer: Self-pay | Admitting: Oncology

## 2019-08-13 ENCOUNTER — Inpatient Hospital Stay: Payer: Medicare Other

## 2019-08-13 VITALS — BP 122/67 | HR 89 | Temp 98.3°F | Resp 18 | Wt 211.0 lb

## 2019-08-13 DIAGNOSIS — D508 Other iron deficiency anemias: Secondary | ICD-10-CM

## 2019-08-13 DIAGNOSIS — E039 Hypothyroidism, unspecified: Secondary | ICD-10-CM | POA: Insufficient documentation

## 2019-08-13 DIAGNOSIS — Z85820 Personal history of malignant melanoma of skin: Secondary | ICD-10-CM | POA: Insufficient documentation

## 2019-08-13 DIAGNOSIS — D509 Iron deficiency anemia, unspecified: Secondary | ICD-10-CM | POA: Insufficient documentation

## 2019-08-13 MED ORDER — SODIUM CHLORIDE 0.9 % IV SOLN
INTRAVENOUS | Status: DC
  Filled 2019-08-13: qty 250

## 2019-08-13 MED ORDER — SODIUM CHLORIDE 0.9 % IV SOLN
510.0000 mg | Freq: Once | INTRAVENOUS | Status: AC
  Administered 2019-08-13: 510 mg via INTRAVENOUS
  Filled 2019-08-13: qty 17

## 2019-08-25 DIAGNOSIS — Z23 Encounter for immunization: Secondary | ICD-10-CM | POA: Diagnosis not present

## 2019-09-04 ENCOUNTER — Other Ambulatory Visit: Payer: Self-pay | Admitting: Internal Medicine

## 2019-09-04 DIAGNOSIS — K219 Gastro-esophageal reflux disease without esophagitis: Secondary | ICD-10-CM

## 2019-09-04 MED ORDER — OMEPRAZOLE 20 MG PO CPDR: 20.0000 mg | DELAYED_RELEASE_CAPSULE | Freq: Every day | ORAL | 3 refills | Status: DC

## 2019-09-07 DIAGNOSIS — R030 Elevated blood-pressure reading, without diagnosis of hypertension: Secondary | ICD-10-CM | POA: Diagnosis not present

## 2019-09-07 DIAGNOSIS — M48062 Spinal stenosis, lumbar region with neurogenic claudication: Secondary | ICD-10-CM | POA: Diagnosis not present

## 2019-09-07 DIAGNOSIS — Z6841 Body Mass Index (BMI) 40.0 and over, adult: Secondary | ICD-10-CM | POA: Diagnosis not present

## 2019-09-12 ENCOUNTER — Encounter: Payer: Self-pay | Admitting: Internal Medicine

## 2019-09-12 NOTE — Progress Notes (Signed)
Office note from 09/07/19 visit placed on Dr Audrie Gallus desk

## 2019-10-17 ENCOUNTER — Other Ambulatory Visit: Payer: Self-pay | Admitting: Internal Medicine

## 2019-10-17 DIAGNOSIS — E039 Hypothyroidism, unspecified: Secondary | ICD-10-CM

## 2019-10-17 MED ORDER — LEVOTHYROXINE SODIUM 125 MCG PO TABS: 125.0000 ug | ORAL_TABLET | Freq: Every day | ORAL | 3 refills | Status: DC

## 2019-10-19 DIAGNOSIS — M1712 Unilateral primary osteoarthritis, left knee: Secondary | ICD-10-CM | POA: Diagnosis not present

## 2019-10-24 DIAGNOSIS — M25562 Pain in left knee: Secondary | ICD-10-CM | POA: Diagnosis not present

## 2019-10-24 DIAGNOSIS — M179 Osteoarthritis of knee, unspecified: Secondary | ICD-10-CM | POA: Diagnosis not present

## 2019-12-05 ENCOUNTER — Telehealth: Payer: Self-pay | Admitting: Emergency Medicine

## 2019-12-05 NOTE — Telephone Encounter (Signed)
Called and spoke with patient to let her know that lab orders had been refaxed to [facility] per her request.

## 2019-12-06 DIAGNOSIS — D509 Iron deficiency anemia, unspecified: Secondary | ICD-10-CM | POA: Diagnosis not present

## 2019-12-07 ENCOUNTER — Encounter: Payer: Self-pay | Admitting: Oncology

## 2019-12-07 NOTE — Progress Notes (Signed)
[place]  Telephone:([phone]) [phone]  Fax:(336) [phone]     AFSA BRUNKEN DOB: Apr 15, 1946  MR#: [id number]  [id number]  Patient Care Team: McLean-Scocuzza, Nino Glow, MD as PCP - General (Internal Medicine)  CHIEF COMPLAINT: Iron deficiency anemia.  INTERVAL HISTORY: Patient returns to clinic today for further evaluation and consideration of additional IV Feraheme.  She has noticed some increased weakness and fatigue recently, but otherwise feels well.  She has no neurologic complaints. She denies any recent fevers or illnesses. She has a good appetite and denies weight loss.  She denies any chest pain, shortness of breath, cough, or hemoptysis.  She denies any nausea, vomiting, constipation, or diarrhea. She has no melena or hematochezia. She has no urinary complaints.  Patient offers no further specific complaints today.  REVIEW OF SYSTEMS:   Review of Systems  Constitutional: Positive for malaise/fatigue. Negative for fever and weight loss.  Eyes: Negative.   Respiratory: Negative.  Negative for cough and shortness of breath.   Cardiovascular: Negative.  Negative for chest pain and leg swelling.  Gastrointestinal: Negative.  Negative for abdominal pain, blood in stool and melena.  Genitourinary: Negative.  Negative for hematuria.  Musculoskeletal: Negative.  Negative for back pain and joint pain.  Skin: Negative.  Negative for rash.  Neurological: Positive for weakness. Negative for dizziness, sensory change and focal weakness.  Psychiatric/Behavioral: Negative.  The patient is not nervous/anxious.     As per HPI. Otherwise, a complete review of systems is negative.  ONCOLOGY HISTORY: Oncology History   No history exists.    PAST MEDICAL HISTORY: Past Medical History:  Diagnosis Date  . Adenomatous colon polyp   . Arthritis   . Childhood asthma    AS A CHILD  . Chronic heartburn    On omeprazole  . Complication of anesthesia    HARD TO WAKE UP    . Dyspnea    low hgb. and iron  chronic problem  . External hemorrhoids   . Frequency of urination   . Frequent loose stools   . GERD (gastroesophageal reflux disease)   . Heme positive stool   . History of hiatal hernia   . Hyperlipidemia   . Hypothyroidism   . Iron deficiency anemia   . Melanoma ([place])    left thigh s/p LN removal left groin  . Metastatic melanoma ([place])    Followed by Dr Oliva Bustard, previous chemo, no recurrence, 2008?  . Multinodular goiter   . SCC (squamous cell carcinoma)    skin    PAST SURGICAL HISTORY: Past Surgical History:  Procedure Laterality Date  . ABDOMINAL HYSTERECTOMY  1984  . BACK SURGERY  03/2016   LUMBAR  . CARPAL TUNNEL RELEASE Right 09/07/2016   Procedure: CARPAL TUNNEL RELEASE;  Surgeon: Thornton Park, MD;  Location: [facility] ORS;  Service: Orthopedics;  Laterality: Right;  . CHOLECYSTECTOMY  1990  . COLONOSCOPY  08/2006   Four sessile polyps found and removed in sigmoid colon at splenic flexure and ascending colon. 7 mm in size. Another removed from transverse colon 4 mm. Path Report - showed tubular adenoma and hyperplastic polyp. Advised to repeat in 3.5 years (02/2010)  . COLONOSCOPY  3.2.2011   8 mm polyp in sigmoid colon, removed, 4 mm polyp in descending colon, removed. Internal hemorrhoids  . ESOPHAGOGASTRODUODENOSCOPY  3.2.2011   Large hiatia hernia, esophagus normal, mulitple small sessile polyps w/no stigmata of recent bleeding found. Mildy erythermatous mucosa w/no bleeding found in gatric antrum. Normal duodenum.  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 PATH - no active inflammation, antral mucosa w/mild foveolar hyperplasia  . FRACTURE SURGERY    . Lymph Node Removal  12/2005   Left inguinal lymph node dissection  . MELANOMA EXCISION Left 1993   Left thigh  . ORIF ANKLE FRACTURE Right    Dr. Mack Guise  . TOTAL HIP ARTHROPLASTY Right 11/18/2016   Procedure: TOTAL HIP ARTHROPLASTY;  Surgeon: Thornton Park, MD;   Location: [facility] ORS;  Service: Orthopedics;  Laterality: Right;    FAMILY HISTORY Family History  Problem Relation Age of Onset  . Aneurysm Mother   . Heart disease Mother   . Colon polyps Father   . Diabetes Father   . Dementia Father   . Stroke Father        TIA  . Leukemia Maternal Grandfather   . Skin cancer Paternal Grandfather   . Diabetes Other   . Colon polyps Other   . Colon cancer Neg Hx   . Breast cancer Neg Hx     GYNECOLOGIC HISTORY:  No LMP recorded. Patient has had a hysterectomy.     ADVANCED DIRECTIVES:    HEALTH MAINTENANCE: Social History   Tobacco Use  . Smoking status: Former Smoker    Packs/day: 0.50    Years: 8.00    Pack years: 4.00    Types: Cigarettes    Quit date: 07/12/1978    Years since quitting: 41.4  . Smokeless tobacco: Never Used  Substance Use Topics  . Alcohol use: No    Alcohol/week: 0.0 standard drinks  . Drug use: No    Allergies  Allergen Reactions  . Sulfa Antibiotics Rash    Current Outpatient Medications  Medication Sig Dispense Refill  . acetaminophen (TYLENOL 8 HOUR ARTHRITIS PAIN) 650 MG CR tablet Take 1,300 mg by mouth daily.     . Calcium Carb-Cholecalciferol (CALCIUM 600+D) 600-800 MG-UNIT TABS Take 1 tablet by mouth daily with lunch.    . Calcium Carbonate-Vitamin D 600-400 MG-UNIT tablet Take by mouth.    . celecoxib (CELEBREX) 200 MG capsule     . citalopram (CELEXA) 20 MG tablet Take 1 tablet (20 mg total) by mouth daily. 90 tablet 3  . Coenzyme Q10 50 MG CAPS Take by mouth.    . Ferrous Sulfate Dried (SLOW RELEASE IRON) 45 MG TBCR Take 45 mg by mouth daily with lunch.    . Iron-Vitamins (GERITOL) LIQD Take by mouth.    . levothyroxine (SYNTHROID) 125 MCG tablet Take 1 tablet (125 mcg total) by mouth daily before breakfast. 90 tablet 3  . loperamide (IMODIUM) 2 MG capsule Take 2 mg by mouth every morning.    . Multiple Vitamins-Minerals (EYE VITAMINS & MINERALS) TABS Take 1 tablet by mouth daily.     .  NON FORMULARY Apply 1 application topically 2 (two) times daily as needed (muscle pain). Horse Liniment    . pravastatin (PRAVACHOL) 40 MG tablet Take 0.5 tablets (20 mg total) by mouth daily. 45 tablet 3  . Propylene Glycol (SYSTANE BALANCE OP) Place 1 drop into both eyes daily as needed (dry eyes).    . TURMERIC PO Take 1 capsule by mouth daily at 12 noon.    Marland Kitchen omeprazole (PRILOSEC) 20 MG capsule Take 1 capsule (20 mg total) by mouth daily. 30 minutes before food. Do not take with thyroid med take with lunch or dinner 90 capsule 3   No current facility-administered medications for this visit.  OBJECTIVE: BP 126/64   Pulse 68   Temp 98 F (36.7 C)   Resp (!) 21   Wt 212 lb 12.8 oz (96.5 kg)   SpO2 97%   BMI 38.92 kg/m    Body mass index is 38.92 kg/m.    ECOG FS:0 - Asymptomatic  General: Well-developed, well-nourished, no acute distress. Eyes: Pink conjunctiva, anicteric sclera. HEENT: Normocephalic, moist mucous membranes. Lungs: No audible wheezing or coughing. Heart: Regular rate and rhythm. Abdomen: Soft, nontender, no obvious distention. Musculoskeletal: No edema, cyanosis, or clubbing. Neuro: Alert, answering all questions appropriately. Cranial nerves grossly intact. Skin: No rashes or petechiae noted. Psych: Normal affect.   LAB RESULTS:  December 27, 2016: WBC 6.6, hemoglobin 10.4, platelet 144. Total iron 20, iron saturation 7%, ferritin 55. April 27, 2017: WBC 6.8, hemoglobin 11.3, platelets 151. Total iron 26, iron saturation 8%, ferritin 24. September 23, 2017: WBC 7.5, hemoglobin 10.9, platelet 165.  Total iron 18, iron saturation 5%, ferritin 21. December 22, 2017: WBC 6.9, hemoglobin 10.3, platelets 159.  Total iron 22, iron saturation 7%, ferritin 22. April 11, 2018: WBC 8.1, hemoglobin 11.5, platelets 190.  Total iron 25, iron saturation 7%, ferrin 24. September 14, 2018: WBC 8.1, hemoglobin 10.2, platelets 192.  Total iron 20, iron saturation 7%, ferritin 32.  CBC     Component Value Date/Time   WBC 6.4 12/31/2018 0611   RBC 2.89 (L) 12/31/2018 0611   HGB 8.8 (L) 12/31/2018 1535   HGB 7.5 (L) 05/29/2018 1410   HCT 29.9 (L) 12/31/2018 1535   HCT 24.2 (L) 05/29/2018 1410   PLT 151 12/31/2018 0611   PLT 170 05/29/2018 1410   MCV 86.9 12/31/2018 0611   MCV 80 05/29/2018 1410   MCV 74 (L) 12/12/2013 1015   MCH 25.3 (L) 12/31/2018 0611   MCHC 29.1 (L) 12/31/2018 0611   RDW 15.7 (H) 12/31/2018 0611   RDW 17.1 (H) 05/29/2018 1410   RDW 33.6 (H) 12/12/2013 1015   LYMPHSABS 1.1 12/30/2018 1125   LYMPHSABS 1.5 05/29/2018 1410   LYMPHSABS 1.2 12/12/2013 1015   MONOABS 0.4 12/30/2018 1125   MONOABS 0.4 12/12/2013 1015   EOSABS 0.1 12/30/2018 1125   EOSABS 0.1 05/29/2018 1410   EOSABS 0.1 12/12/2013 1015   BASOSABS 0.0 12/30/2018 1125   BASOSABS 0.0 05/29/2018 1410   BASOSABS 0.1 12/12/2013 1015   Lab Results  Component Value Date   IRON 25 (L) 08/31/2018   TIBC 280 08/31/2018   IRONPCTSAT 9 (LL) 08/31/2018   Lab Results  Component Value Date   FERRITIN 43 08/31/2018      STUDIES: No results found.  ASSESSMENT & PLAN:    1. Iron deficiency anemia: Patient reportedly had a normal colonoscopy in March 2016.  Patient's most recent hemoglobin has decreased to 9.2 with a decreased total iron of 16, iron saturation of 5%, and ferritin of 37.  She also is symptomatic with increased weakness and fatigue.  Proceed with 510 mg IV Feraheme today.  Return to clinic in 1 week for second infusion.  Patient will then return to clinic in 3 months with repeat laboratory work, further evaluation, and consideration of additional Feraheme if needed.  Of note, patient receives all her laboratory work at [facility].  She has also been instructed to call her primary GI physician for consideration of repeat colonoscopy.   2. History of melanoma: Melanoma of left thigh status post resection in 1993, exact stage is not known, metastatic to inguinal lymph node.  No evidence  of recurrent disease. 3. Back Pain: Chronic and unchanged.  Patient does not complain of this today.  Patient had surgery in September 2017.  4. Hypertension: Blood pressure continues to be within normal limits.  I spent a total of 30 minutes reviewing chart data, face-to-face evaluation with the patient, counseling and coordination of care as detailed above.   Patient expressed understanding and was in agreement with this plan. She also understands that She can call clinic at any time with any questions, concerns, or complaints.    Lloyd Huger, MD   12/13/2019 7:09 AM

## 2019-12-11 ENCOUNTER — Ambulatory Visit: Payer: Medicare Other | Admitting: Oncology

## 2019-12-11 ENCOUNTER — Ambulatory Visit: Payer: Medicare Other

## 2019-12-11 NOTE — Progress Notes (Signed)
Called patient to go over pre screening questions. patient denied any pain

## 2019-12-12 ENCOUNTER — Other Ambulatory Visit: Payer: Self-pay

## 2019-12-12 ENCOUNTER — Inpatient Hospital Stay: Payer: Medicare Other | Attending: Oncology | Admitting: Oncology

## 2019-12-12 ENCOUNTER — Encounter: Payer: Self-pay | Admitting: Oncology

## 2019-12-12 ENCOUNTER — Inpatient Hospital Stay: Payer: Medicare Other

## 2019-12-12 VITALS — BP 126/64 | HR 68 | Temp 98.0°F | Resp 21 | Wt 212.8 lb

## 2019-12-12 VITALS — BP 121/61 | HR 71 | Temp 96.8°F | Resp 18

## 2019-12-12 DIAGNOSIS — M549 Dorsalgia, unspecified: Secondary | ICD-10-CM | POA: Diagnosis not present

## 2019-12-12 DIAGNOSIS — Z85820 Personal history of malignant melanoma of skin: Secondary | ICD-10-CM | POA: Diagnosis not present

## 2019-12-12 DIAGNOSIS — D509 Iron deficiency anemia, unspecified: Secondary | ICD-10-CM | POA: Diagnosis not present

## 2019-12-12 DIAGNOSIS — D508 Other iron deficiency anemias: Secondary | ICD-10-CM

## 2019-12-12 DIAGNOSIS — G8929 Other chronic pain: Secondary | ICD-10-CM | POA: Insufficient documentation

## 2019-12-12 MED ORDER — SODIUM CHLORIDE 0.9 % IV SOLN
510.0000 mg | Freq: Once | INTRAVENOUS | Status: AC
  Administered 2019-12-12: 510 mg via INTRAVENOUS
  Filled 2019-12-12: qty 510

## 2019-12-12 MED ORDER — SODIUM CHLORIDE 0.9 % IV SOLN
Freq: Once | INTRAVENOUS | Status: AC
  Filled 2019-12-12: qty 250

## 2019-12-17 DIAGNOSIS — M1712 Unilateral primary osteoarthritis, left knee: Secondary | ICD-10-CM | POA: Diagnosis not present

## 2019-12-20 ENCOUNTER — Inpatient Hospital Stay: Payer: Medicare Other

## 2019-12-20 ENCOUNTER — Other Ambulatory Visit: Payer: Self-pay

## 2019-12-20 VITALS — BP 115/71 | HR 74 | Temp 98.7°F | Resp 18

## 2019-12-20 DIAGNOSIS — D508 Other iron deficiency anemias: Secondary | ICD-10-CM

## 2019-12-20 DIAGNOSIS — D509 Iron deficiency anemia, unspecified: Secondary | ICD-10-CM | POA: Diagnosis not present

## 2019-12-20 MED ORDER — SODIUM CHLORIDE 0.9 % IV SOLN
510.0000 mg | Freq: Once | INTRAVENOUS | Status: AC
  Administered 2019-12-20: 510 mg via INTRAVENOUS
  Filled 2019-12-20: qty 510

## 2019-12-20 MED ORDER — SODIUM CHLORIDE 0.9 % IV SOLN
Freq: Once | INTRAVENOUS | Status: AC
  Filled 2019-12-20: qty 250

## 2019-12-20 NOTE — Progress Notes (Signed)
Pt tolerated fereheme infusion well. Refused to stay for 30 min post observation period. VSS on discharge.

## 2020-01-02 DIAGNOSIS — C44219 Basal cell carcinoma of skin of left ear and external auricular canal: Secondary | ICD-10-CM | POA: Diagnosis not present

## 2020-01-02 DIAGNOSIS — D2239 Melanocytic nevi of other parts of face: Secondary | ICD-10-CM | POA: Diagnosis not present

## 2020-01-23 DIAGNOSIS — D2222 Melanocytic nevi of left ear and external auricular canal: Secondary | ICD-10-CM | POA: Diagnosis not present

## 2020-02-16 ENCOUNTER — Encounter: Payer: Self-pay | Admitting: Internal Medicine

## 2020-02-18 ENCOUNTER — Telehealth: Payer: Self-pay | Admitting: Internal Medicine

## 2020-02-18 NOTE — Telephone Encounter (Signed)
Patient scheduled a yearly follow on 02/27/2020 with Dr. Aundra Dubin and would like to do labs before her appt. Patient would like her lab orders sent to [facility]

## 2020-02-20 NOTE — Telephone Encounter (Signed)
Left message to return call 

## 2020-02-20 NOTE — Addendum Note (Signed)
Addended by: Orland Mustard on: 02/20/2020 10:44 AM   Modules accepted: Orders

## 2020-02-20 NOTE — Telephone Encounter (Signed)
Pt called back returning your call °

## 2020-02-20 NOTE — Telephone Encounter (Signed)
Labs in system she can come pick up copy as well

## 2020-02-21 DIAGNOSIS — E559 Vitamin D deficiency, unspecified: Secondary | ICD-10-CM | POA: Diagnosis not present

## 2020-02-21 DIAGNOSIS — R7303 Prediabetes: Secondary | ICD-10-CM | POA: Diagnosis not present

## 2020-02-21 DIAGNOSIS — D509 Iron deficiency anemia, unspecified: Secondary | ICD-10-CM | POA: Diagnosis not present

## 2020-02-21 DIAGNOSIS — Z1329 Encounter for screening for other suspected endocrine disorder: Secondary | ICD-10-CM | POA: Diagnosis not present

## 2020-02-21 DIAGNOSIS — Z1389 Encounter for screening for other disorder: Secondary | ICD-10-CM | POA: Diagnosis not present

## 2020-02-21 DIAGNOSIS — E785 Hyperlipidemia, unspecified: Secondary | ICD-10-CM | POA: Diagnosis not present

## 2020-02-21 NOTE — Telephone Encounter (Signed)
Patient informed and verbalized understanding.   She has had her labs done and was unable to leave a sample. She will attempt to do this in office at her visit

## 2020-02-22 ENCOUNTER — Encounter: Payer: Self-pay | Admitting: Oncology

## 2020-02-22 LAB — HEMOGLOBIN A1C
Est. average glucose Bld gHb Est-mCnc: 88 mg/dL
Hgb A1c MFr Bld: 4.7 % — ABNORMAL LOW (ref 4.8–5.6)

## 2020-02-22 LAB — CBC WITH DIFFERENTIAL/PLATELET
Basophils Absolute: 0 10*3/uL (ref 0.0–0.2)
Basos: 0 %
EOS (ABSOLUTE): 0.1 10*3/uL (ref 0.0–0.4)
Eos: 2 %
Hematocrit: 31.7 % — ABNORMAL LOW (ref 34.0–46.6)
Hemoglobin: 9.8 g/dL — ABNORMAL LOW (ref 11.1–15.9)
Immature Grans (Abs): 0 10*3/uL (ref 0.0–0.1)
Immature Granulocytes: 0 %
Lymphocytes Absolute: 0.8 10*3/uL (ref 0.7–3.1)
Lymphs: 15 %
MCH: 28.1 pg (ref 26.6–33.0)
MCHC: 30.9 g/dL — ABNORMAL LOW (ref 31.5–35.7)
MCV: 91 fL (ref 79–97)
Monocytes Absolute: 0.3 10*3/uL (ref 0.1–0.9)
Monocytes: 6 %
Neutrophils Absolute: 3.8 10*3/uL (ref 1.4–7.0)
Neutrophils: 77 %
Platelets: 154 10*3/uL (ref 150–450)
RBC: 3.49 x10E6/uL — ABNORMAL LOW (ref 3.77–5.28)
RDW: 13.4 % (ref 11.7–15.4)
WBC: 5 10*3/uL (ref 3.4–10.8)

## 2020-02-22 LAB — COMPREHENSIVE METABOLIC PANEL
ALT: 19 IU/L (ref 0–32)
AST: 25 IU/L (ref 0–40)
Albumin/Globulin Ratio: 1.8 (ref 1.2–2.2)
Albumin: 4.2 g/dL (ref 3.7–4.7)
Alkaline Phosphatase: 126 IU/L — ABNORMAL HIGH (ref 48–121)
BUN/Creatinine Ratio: 8 — ABNORMAL LOW (ref 12–28)
BUN: 8 mg/dL (ref 8–27)
Bilirubin Total: <0.2 mg/dL (ref 0.0–1.2)
CO2: 26 mmol/L (ref 20–29)
Calcium: 9.4 mg/dL (ref 8.7–10.3)
Chloride: 101 mmol/L (ref 96–106)
Creatinine, Ser: 0.99 mg/dL (ref 0.57–1.00)
GFR calc Af Amer: 65 mL/min/{1.73_m2} (ref >59)
GFR calc non Af Amer: 57 mL/min/{1.73_m2} — ABNORMAL LOW (ref >59)
Globulin, Total: 2.4 g/dL (ref 1.5–4.5)
Glucose: 83 mg/dL (ref 65–99)
Potassium: 4.6 mmol/L (ref 3.5–5.2)
Sodium: 142 mmol/L (ref 134–144)
Total Protein: 6.6 g/dL (ref 6.0–8.5)

## 2020-02-22 LAB — URINALYSIS, ROUTINE W REFLEX MICROSCOPIC

## 2020-02-22 LAB — IRON,TIBC AND FERRITIN PANEL
Ferritin: 45 ng/mL (ref 15–150)
Iron Saturation: 6 % — CL (ref 15–55)
Iron: 18 ug/dL — ABNORMAL LOW (ref 27–139)
Total Iron Binding Capacity: 300 ug/dL (ref 250–450)
UIBC: 282 ug/dL (ref 118–369)

## 2020-02-22 LAB — LIPID PANEL
Chol/HDL Ratio: 3.5 ratio (ref 0.0–4.4)
Cholesterol, Total: 192 mg/dL (ref 100–199)
HDL: 55 mg/dL (ref >39)
LDL Chol Calc (NIH): 103 mg/dL — ABNORMAL HIGH (ref 0–99)
Triglycerides: 197 mg/dL — ABNORMAL HIGH (ref 0–149)
VLDL Cholesterol Cal: 34 mg/dL (ref 5–40)

## 2020-02-22 LAB — TSH: TSH: 0.269 u[IU]/mL — ABNORMAL LOW (ref 0.450–4.500)

## 2020-02-22 LAB — VITAMIN D 25 HYDROXY (VIT D DEFICIENCY, FRACTURES): Vit D, 25-Hydroxy: 54.4 ng/mL (ref 30.0–100.0)

## 2020-02-27 ENCOUNTER — Other Ambulatory Visit: Payer: Self-pay

## 2020-02-27 ENCOUNTER — Ambulatory Visit (INDEPENDENT_AMBULATORY_CARE_PROVIDER_SITE_OTHER): Payer: Medicare Other | Admitting: Internal Medicine

## 2020-02-27 ENCOUNTER — Encounter: Payer: Self-pay | Admitting: Internal Medicine

## 2020-02-27 VITALS — BP 108/68 | HR 79 | Temp 98.3°F | Ht 61.42 in | Wt 209.8 lb

## 2020-02-27 DIAGNOSIS — R197 Diarrhea, unspecified: Secondary | ICD-10-CM | POA: Diagnosis not present

## 2020-02-27 DIAGNOSIS — E039 Hypothyroidism, unspecified: Secondary | ICD-10-CM

## 2020-02-27 DIAGNOSIS — Z1211 Encounter for screening for malignant neoplasm of colon: Secondary | ICD-10-CM | POA: Diagnosis not present

## 2020-02-27 DIAGNOSIS — Z1231 Encounter for screening mammogram for malignant neoplasm of breast: Secondary | ICD-10-CM

## 2020-02-27 DIAGNOSIS — E669 Obesity, unspecified: Secondary | ICD-10-CM

## 2020-02-27 DIAGNOSIS — D509 Iron deficiency anemia, unspecified: Secondary | ICD-10-CM

## 2020-02-27 MED ORDER — LEVOTHYROXINE SODIUM 125 MCG PO TABS: 125.0000 ug | ORAL_TABLET | Freq: Every day | ORAL | 3 refills | Status: DC

## 2020-02-27 NOTE — Progress Notes (Signed)
Chief Complaint  Patient presents with  . Annual Exam   F/u  1. Iron def anemia hbg 9.8 and iron def  Discuss with Dr. Grayland Ormond and he will increase the frequency of how often she gets her iron infusions She already gets feraheme Q3 months and takes oral iron and geritol daily  2. Hypothyroidism on levo 125 mcg qd tsh 0.269  3. Daily diarrhea worse in the am and has to take prn immodium denies blood in stool    Review of Systems  Constitutional:       Down 3 lbs  HENT: Negative for hearing loss.   Eyes: Negative for blurred vision.  Respiratory: Negative for shortness of breath.   Cardiovascular: Negative for chest pain.  Gastrointestinal: Positive for diarrhea. Negative for blood in stool.  Musculoskeletal: Positive for joint pain.  Skin: Negative for rash.  Psychiatric/Behavioral: Negative for depression and memory loss.   Past Medical History:  Diagnosis Date  . Adenomatous colon polyp   . Arthritis   . Childhood asthma    AS A CHILD  . Chronic heartburn    On omeprazole  . Complication of anesthesia    HARD TO WAKE UP  . Dyspnea    low hgb. and iron  chronic problem  . External hemorrhoids   . Frequency of urination   . Frequent loose stools   . GERD (gastroesophageal reflux disease)   . Heme positive stool   . History of hiatal hernia   . Hyperlipidemia   . Hypothyroidism   . Iron deficiency anemia   . Melanoma ([place])    left thigh s/p LN removal left groin  . Metastatic melanoma ([place])    Followed by Dr Oliva Bustard, previous chemo, no recurrence, 2008?  . Multinodular goiter   . SCC (squamous cell carcinoma)    skin   Past Surgical History:  Procedure Laterality Date  . ABDOMINAL HYSTERECTOMY  1984  . BACK SURGERY  03/2016   LUMBAR  . CARPAL TUNNEL RELEASE Right 09/07/2016   Procedure: CARPAL TUNNEL RELEASE;  Surgeon: Thornton Park, MD;  Location: [facility] ORS;  Service: Orthopedics;  Laterality: Right;  . CHOLECYSTECTOMY  1990  . COLONOSCOPY  08/2006   Four  sessile polyps found and removed in sigmoid colon at splenic flexure and ascending colon. 7 mm in size. Another removed from transverse colon 4 mm. Path Report - showed tubular adenoma and hyperplastic polyp. Advised to repeat in 3.5 years (02/2010)  . COLONOSCOPY  3.2.2011   8 mm polyp in sigmoid colon, removed, 4 mm polyp in descending colon, removed. Internal hemorrhoids  . ESOPHAGOGASTRODUODENOSCOPY  3.2.2011   Large hiatia hernia, esophagus normal, mulitple small sessile polyps w/no stigmata of recent bleeding found. Mildy erythermatous mucosa w/no bleeding found in gatric antrum. Normal duodenum. 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 PATH - no active inflammation, antral mucosa w/mild foveolar hyperplasia  . FRACTURE SURGERY    . Lymph Node Removal  12/2005   Left inguinal lymph node dissection  . MELANOMA EXCISION Left 1993   Left thigh  . ORIF ANKLE FRACTURE Right    Dr. Mack Guise  . TOTAL HIP ARTHROPLASTY Right 11/18/2016   Procedure: TOTAL HIP ARTHROPLASTY;  Surgeon: Thornton Park, MD;  Location: [facility] ORS;  Service: Orthopedics;  Laterality: Right;   Family History  Problem Relation Age of Onset  . Aneurysm Mother   . Heart disease Mother   . Colon polyps Father   . Diabetes Father   . Dementia  Father   . Stroke Father        TIA  . Leukemia Maternal Grandfather   . Skin cancer Paternal Grandfather   . Diabetes Other   . Colon polyps Other   . Colon cancer Neg Hx   . Breast cancer Neg Hx    Social History   Socioeconomic History  . Marital status: Married    Spouse name: Not on file  . Number of children: 2  . Years of education: Not on file  . Highest education level: Not on file  Occupational History  . Occupation: HR Consultant    Employer: [employer]  Tobacco Use  . Smoking status: Former Smoker    Packs/day: 0.50    Years: 8.00    Pack years: 4.00    Types: Cigarettes    Quit date: 07/12/1978    Years since quitting: 41.6  . Smokeless  tobacco: Never Used  Vaping Use  . Vaping Use: Never used  Substance and Sexual Activity  . Alcohol use: No    Alcohol/week: 0.0 standard drinks  . Drug use: No  . Sexual activity: Not Currently  Other Topics Concern  . Not on file  Social History Narrative   Part time [employer] as of 03/10/20 will be retiring   Married    Social Determinants of Radio broadcast assistant Strain:   . Difficulty of Paying Living Expenses:   Food Insecurity:   . Worried About Charity fundraiser in the Last Year:   . Arboriculturist in the Last Year:   Transportation Needs:   . Film/video editor (Medical):   Marland Kitchen Lack of Transportation (Non-Medical):   Physical Activity:   . Days of Exercise per Week:   . Minutes of Exercise per Session:   Stress:   . Feeling of Stress :   Social Connections:   . Frequency of Communication with Friends and Family:   . Frequency of Social Gatherings with Friends and Family:   . Attends Religious Services:   . Active Member of Clubs or Organizations:   . Attends Archivist Meetings:   Marland Kitchen Marital Status:   Intimate Partner Violence:   . Fear of Current or Ex-Partner:   . Emotionally Abused:   Marland Kitchen Physically Abused:   . Sexually Abused:    Current Meds  Medication Sig  . acetaminophen (TYLENOL 8 HOUR ARTHRITIS PAIN) 650 MG CR tablet Take 1,300 mg by mouth daily.   . Calcium Carb-Cholecalciferol (CALCIUM 600+D) 600-800 MG-UNIT TABS Take 1 tablet by mouth daily with lunch.  . Calcium Carbonate-Vitamin D 600-400 MG-UNIT tablet Take by mouth.  . celecoxib (CELEBREX) 200 MG capsule   . citalopram (CELEXA) 20 MG tablet Take 1 tablet (20 mg total) by mouth daily.  . Coenzyme Q10 50 MG CAPS Take by mouth.  . Ferrous Sulfate Dried (SLOW RELEASE IRON) 45 MG TBCR Take 45 mg by mouth daily with lunch.  . Iron-Vitamins (GERITOL) LIQD Take by mouth.  . levothyroxine (SYNTHROID) 125 MCG tablet Take 1 tablet (125 mcg total) by mouth daily before breakfast. Skip  sundays  . loperamide (IMODIUM) 2 MG capsule Take 2 mg by mouth every morning.  . Multiple Vitamins-Minerals (EYE VITAMINS & MINERALS) TABS Take 1 tablet by mouth daily.   . NON FORMULARY Apply 1 application topically 2 (two) times daily as needed (muscle pain). Horse Liniment  . pravastatin (PRAVACHOL) 40 MG tablet Take 0.5 tablets (20 mg total) by mouth daily.  Marland Kitchen  Propylene Glycol (SYSTANE BALANCE OP) Place 1 drop into both eyes daily as needed (dry eyes).  . [DISCONTINUED] levothyroxine (SYNTHROID) 125 MCG tablet Take 1 tablet (125 mcg total) by mouth daily before breakfast.   Allergies  Allergen Reactions  . Sulfa Antibiotics Rash   Recent Results (from the past 2160 hour(s))  Comprehensive metabolic panel     Status: Abnormal   Collection Time: 02/21/20  8:47 AM  Result Value Ref Range   Glucose 83 65 - 99 mg/dL   BUN 8 8 - 27 mg/dL   Creatinine, Ser 0.99 0.57 - 1.00 mg/dL   GFR calc non Af Amer 57 (L) >59 mL/min/1.73   GFR calc Af Amer 65 >59 mL/min/1.73    Comment: **[employer] currently reports eGFR in compliance with the current**   recommendations of the [employer]. [facility] will   update reporting as new guidelines are published from the NKF-ASN   Task force.    BUN/Creatinine Ratio 8 (L) 12 - 28   Sodium 142 134 - 144 mmol/L   Potassium 4.6 3.5 - 5.2 mmol/L   Chloride 101 96 - 106 mmol/L   CO2 26 20 - 29 mmol/L   Calcium 9.4 8.7 - 10.3 mg/dL   Total Protein 6.6 6.0 - 8.5 g/dL   Albumin 4.2 3.7 - 4.7 g/dL   Globulin, Total 2.4 1.5 - 4.5 g/dL   Albumin/Globulin Ratio 1.8 1.2 - 2.2   Bilirubin Total <0.2 0.0 - 1.2 mg/dL   Alkaline Phosphatase 126 (H) 48 - 121 IU/L   AST 25 0 - 40 IU/L   ALT 19 0 - 32 IU/L  Lipid panel     Status: Abnormal   Collection Time: 02/21/20  8:47 AM  Result Value Ref Range   Cholesterol, Total 192 100 - 199 mg/dL   Triglycerides 197 (H) 0 - 149 mg/dL   HDL 55 >39 mg/dL   VLDL Cholesterol Cal 34 5 - 40 mg/dL   LDL Chol Calc  (NIH) 103 (H) 0 - 99 mg/dL   Chol/HDL Ratio 3.5 0.0 - 4.4 ratio    Comment:                                   T. Chol/HDL Ratio                                             Men  Women                               1/2 Avg.Risk  3.4    3.3                                   Avg.Risk  5.0    4.4                                2X Avg.Risk  9.6    7.1                                3X Avg.Risk 23.4  11.0   CBC with Differential/Platelet     Status: Abnormal   Collection Time: 02/21/20  8:47 AM  Result Value Ref Range   WBC 5.0 3.4 - 10.8 x10E3/uL   RBC 3.49 (L) 3.77 - 5.28 x10E6/uL   Hemoglobin 9.8 (L) 11.1 - 15.9 g/dL   Hematocrit 31.7 (L) 34.0 - 46.6 %   MCV 91 79 - 97 fL   MCH 28.1 26.6 - 33.0 pg   MCHC 30.9 (L) 31 - 35 g/dL   RDW 13.4 11.7 - 15.4 %   Platelets 154 150 - 450 x10E3/uL   Neutrophils 77 Not Estab. %   Lymphs 15 Not Estab. %   Monocytes 6 Not Estab. %   Eos 2 Not Estab. %   Basos 0 Not Estab. %   Neutrophils Absolute 3.8 1 - 7 x10E3/uL   Lymphocytes Absolute 0.8 0 - 3 x10E3/uL   Monocytes Absolute 0.3 0 - 0 x10E3/uL   EOS (ABSOLUTE) 0.1 0.0 - 0.4 x10E3/uL   Basophils Absolute 0.0 0 - 0 x10E3/uL   Immature Granulocytes 0 Not Estab. %   Immature Grans (Abs) 0.0 0.0 - 0.1 x10E3/uL  TSH     Status: Abnormal   Collection Time: 02/21/20  8:47 AM  Result Value Ref Range   TSH 0.269 (L) 0.450 - 4.500 uIU/mL  Urinalysis, Routine w reflex microscopic     Status: None   Collection Time: 02/21/20  8:47 AM  Result Value Ref Range   Specific Gravity, UA CANCELED     Comment: Test not performed. Patient was unable to provide a self-collected specimen for the requested testing. The following test(s) were not performed:  Result canceled by the ancillary.   Hemoglobin A1c     Status: Abnormal   Collection Time: 02/21/20  8:47 AM  Result Value Ref Range   Hgb A1c MFr Bld 4.7 (L) 4.8 - 5.6 %    Comment:          Prediabetes: 5.7 - 6.4          Diabetes: >6.4           Glycemic control for adults with diabetes: <7.0    Est. average glucose Bld gHb Est-mCnc 88 mg/dL  Iron, TIBC and Ferritin Panel     Status: Abnormal   Collection Time: 02/21/20  8:47 AM  Result Value Ref Range   Total Iron Binding Capacity 300 250 - 450 ug/dL   UIBC 282 118 - 369 ug/dL   Iron 18 (L) 27 - 139 ug/dL   Iron Saturation 6 (LL) 15 - 55 %   Ferritin 45 15.0 - 150.0 ng/mL  Vitamin D (25 hydroxy)     Status: None   Collection Time: 02/21/20  8:47 AM  Result Value Ref Range   Vit D, 25-Hydroxy 54.4 30.0 - 100.0 ng/mL    Comment: Vitamin D deficiency has been defined by the [place] and an Endocrine Society practice guideline as a level of serum 25-OH vitamin D less than 20 ng/mL (1,2). The Endocrine Society went on to further define vitamin D insufficiency as a level between 21 and 29 ng/mL (2). 1. IOM (Institute of Medicine). 2010. Dietary reference    intakes for calcium and D. [place]: The    [employer]. 2. Holick MF, Binkley [place], Bischoff-Ferrari HA, et al.    Evaluation, treatment, and prevention of vitamin D    deficiency: an Endocrine Society clinical practice    guideline. JCEM.  2011 Jul; 96(7):1911-30.    Objective  Body mass index is 39.1 kg/m. Wt Readings from Last 3 Encounters:  02/27/20 209 lb 12.8 oz (95.2 kg)  12/11/19 212 lb 12.8 oz (96.5 kg)  08/13/19 211 lb (95.7 kg)   Temp Readings from Last 3 Encounters:  02/27/20 98.3 F (36.8 C) (Oral)  12/20/19 98.7 F (37.1 C) (Tympanic)  12/12/19 (!) 96.8 F (36 C) (Tympanic)   BP Readings from Last 3 Encounters:  02/27/20 108/68  12/20/19 115/71  12/12/19 121/61   Pulse Readings from Last 3 Encounters:  02/27/20 79  12/20/19 74  12/12/19 71    Physical Exam Vitals and nursing note reviewed.  Constitutional:      Appearance: Normal appearance. She is well-developed and well-groomed. She is obese.  HENT:     Head: Normocephalic and atraumatic.  Eyes:      Conjunctiva/sclera: Conjunctivae normal.     Pupils: Pupils are equal, round, and reactive to light.  Cardiovascular:     Rate and Rhythm: Normal rate and regular rhythm.     Heart sounds: Normal heart sounds. No murmur heard.   Pulmonary:     Effort: Pulmonary effort is normal.     Breath sounds: Normal breath sounds.  Skin:    General: Skin is warm and dry.  Neurological:     General: No focal deficit present.     Mental Status: She is alert and oriented to person, place, and time. Mental status is at baseline.     Comments: BL walks with cane  Psychiatric:        Attention and Perception: Attention and perception normal.        Mood and Affect: Mood and affect normal.        Speech: Speech normal.        Behavior: Behavior normal. Behavior is cooperative.        Thought Content: Thought content normal.        Cognition and Memory: Cognition and memory normal.        Judgment: Judgment normal.     Assessment  Plan  Hypothyroidism, unspecified type - Plan: levothyroxine (SYNTHROID) 125 MCG tablet, TSH due 04/10/20 skip sundays for thyroid medication   Diarrhea, unspecified type - Plan: Ambulatory referral to Gastroenterology Dr. Hilarie Fredrickson Iron deficiency anemia, unspecified iron deficiency anemia type - Plan: Ambulatory referral to Gastroenterology Encounter for screening colonoscopy - Plan: Ambulatory referral to Gastroenterology  Obesity (BMI 30-39.9)  rec healthy diet and exercise though exercise limited due to left hip and knee pain  HM Had flu shotutd  pna 23 utd  prevnar utd  Tdap had 03/24/12  shingrix given Rx today also check with [facility] covid 2/2 pfizer   01/05/2019 dexa + osteopeorosis never took any meds sch 01/05/19 +osteoporosis consider prolia given info today 02/27/20  Hep C negative in the past per pt husband has hep C  Colonoscopy had 10/02/14 5 polyps repeat in 5 years -referred screening colonoscopy   mammo neg 12/17/18pt to call to schedulesch  01/05/19 negative  -call to schedule repeat  S/p hysterctomy age 23 y.o for cycts no h/o abnormal pap per pt   Dr. Grayland Ormond f/u Q3 months h/o iron def anemia chronically  Derm appt Dr. Kellie Moor seen summer 2021 f/u in 6 months h/o [facility] Dr. Raliegh Ip   Former smoker 40 years ago quit smoked <1 ppd total 6-7 years   [facility] 01/10/19 records left hip OA advanced left knee OA Dr. Raliegh Ip  given injection and will f/u PRN Dr. Raliegh Ip as of 02/27/20 disc. ? TKR left vs total hip left and s/p right total hip   [place] [facility] saw Dr. Arnoldo Morale 03/06/19 deg scoliosis, spondylolisthesis, spinal stenosis with neurogenic claudication doing well 9 months s/p extension of fusion Xrays look good f/u in 6 months  [place] [facility] 09/07/19 >1 year s/p extension of lumbar fusion doing well some back pain with exertion neurogenic claudication resolved on celebrex 200 mg qd and tylenol good relief   Provider: Dr. Olivia Mackie Mc[facility]oc[facility]

## 2020-02-27 NOTE — Patient Instructions (Addendum)
Consider shingrix vaccine   Skip thyroid medication on Sundays please   Zoster Vaccine, Recombinant injection What is this medicine? ZOSTER VACCINE (ZOS ter vak SEEN) is used to prevent shingles in adults 74 years old and over. This vaccine is not used to treat shingles or nerve pain from shingles. This medicine may be used for other purposes; ask your health care provider or pharmacist if you have questions. COMMON BRAND NAME(S): [facility] What should I tell my health care provider before I take this medicine? They need to know if you have any of these conditions:  blood disorders or disease  cancer like leukemia or lymphoma  immune system problems or therapy  an unusual or allergic reaction to vaccines, other medications, foods, dyes, or preservatives  pregnant or trying to get pregnant  breast-feeding How should I use this medicine? This vaccine is for injection in a muscle. It is given by a health care professional. Talk to your pediatrician regarding the use of this medicine in children. This medicine is not approved for use in children. Overdosage: If you think you have taken too much of this medicine contact a poison control center or emergency room at once. NOTE: This medicine is only for you. Do not share this medicine with others. What if I miss a dose? Keep appointments for follow-up (booster) doses as directed. It is important not to miss your dose. Call your doctor or health care professional if you are unable to keep an appointment. What may interact with this medicine?  medicines that suppress your immune system  medicines to treat cancer  steroid medicines like prednisone or cortisone This list may not describe all possible interactions. Give your health care provider a list of all the medicines, herbs, non-prescription drugs, or dietary supplements you use. Also tell them if you smoke, drink alcohol, or use illegal drugs. Some items may interact with your  medicine. What should I watch for while using this medicine? Visit your doctor for regular check ups. This vaccine, like all vaccines, may not fully protect everyone. What side effects may I notice from receiving this medicine? Side effects that you should report to your doctor or health care professional as soon as possible:  allergic reactions like skin rash, itching or hives, swelling of the face, lips, or tongue  breathing problems Side effects that usually do not require medical attention (report these to your doctor or health care professional if they continue or are bothersome):  chills  headache  fever  nausea, vomiting  redness, warmth, pain, swelling or itching at site where injected  tiredness This list may not describe all possible side effects. Call your doctor for medical advice about side effects. You may report side effects to FDA at 1-800-FDA-1088. Where should I keep my medicine? This vaccine is only given in a clinic, pharmacy, doctor's office, or other health care setting and will not be stored at home. NOTE: This sheet is a summary. It may not cover all possible information. If you have questions about this medicine, talk to your doctor, pharmacist, or health care provider.  2020 Elsevier/Gold Standard (2017-02-07 13:20:30)  Osteoporosis  Osteoporosis is thinning and loss of density in your bones. Osteoporosis makes bones more brittle and fragile and more likely to break (fracture). Over time, osteoporosis can cause your bones to become so weak that they fracture after a minor fall. Bones in the hip, wrist, and spine are most likely to fracture due to osteoporosis. What are the causes? The  exact cause of this condition is not known. What increases the risk? You may be at greater risk for osteoporosis if you:  Have a family history of the condition.  Have poor nutrition.  Use steroid medicines, such as prednisone.  Are female.  Are age 47 or  older.  Smoke or have a history of smoking.  Are not physically active (are sedentary).  Are white (Caucasian) or of Asian descent.  Have a small body frame.  Take certain medicines, such as antiseizure medicines. What are the signs or symptoms? A fracture might be the first sign of osteoporosis, especially if the fracture results from a fall or injury that usually would not cause a bone to break. Other signs and symptoms include:  Pain in the neck or low back.  Stooped posture.  Loss of height. How is this diagnosed? This condition may be diagnosed based on:  Your medical history.  A physical exam.  A bone mineral density test, also called a DXA or DEXA test (dual-energy X-ray absorptiometry test). This test uses X-rays to measure the amount of minerals in your bones. How is this treated? The goal of treatment is to strengthen your bones and lower your risk for a fracture. Treatment may involve:  Making lifestyle changes, such as: ? Including foods with more calcium and vitamin D in your diet. ? Doing weight-bearing and muscle-strengthening exercises. ? Stopping tobacco use. ? Limiting alcohol intake.  Taking medicine to slow the process of bone loss or to increase bone density.  Taking daily supplements of calcium and vitamin D.  Taking hormone replacement medicines, such as estrogen for women and testosterone for men.  Monitoring your levels of calcium and vitamin D. Follow these instructions at home:  Activity  Exercise as told by your health care provider. Ask your health care provider what exercises and activities are safe for you. You should do: ? Exercises that make you work against gravity (weight-bearing exercises), such as tai chi, yoga, or walking. ? Exercises to strengthen muscles, such as lifting weights. Lifestyle  Limit alcohol intake to no more than 1 drink a day for nonpregnant women and 2 drinks a day for men. One drink equals 12 oz of beer, 5 oz  of wine, or 1 oz of hard liquor.  Do not use any products that contain nicotine or tobacco, such as cigarettes and e-cigarettes. If you need help quitting, ask your health care provider. Preventing falls  Use devices to help you move around (mobility aids) as needed, such as canes, walkers, scooters, or crutches.  Keep rooms well-lit and clutter-free.  Remove tripping hazards from walkways, including cords and throw rugs.  Install grab bars in bathrooms and safety rails on stairs.  Use rubber mats in the bathroom and other areas that are often wet or slippery.  Wear closed-toe shoes that fit well and support your feet. Wear shoes that have rubber soles or low heels.  Review your medicines with your health care provider. Some medicines can cause dizziness or changes in blood pressure, which can increase your risk of falling. General instructions  Include calcium and vitamin D in your diet. Calcium is important for bone health, and vitamin D helps your body to absorb calcium. Good sources of calcium and vitamin D include: ? Certain fatty fish, such as salmon and tuna. ? Products that have calcium and vitamin D added to them (fortified products), such as fortified cereals. ? Egg yolks. ? Cheese. ? Liver.  Take over-the-counter and  prescription medicines only as told by your health care provider.  Keep all follow-up visits as told by your health care provider. This is important. Contact a health care provider if:  You have never been screened for osteoporosis and you are: ? A woman who is age 84 or older. ? A man who is age 3 or older. Get help right away if:  You fall or injure yourself. Summary  Osteoporosis is thinning and loss of density in your bones. This makes bones more brittle and fragile and more likely to break (fracture),even with minor falls.  The goal of treatment is to strengthen your bones and reduce your risk for a fracture.  Include calcium and vitamin D in  your diet. Calcium is important for bone health, and vitamin D helps your body to absorb calcium.  Talk with your health care provider about screening for osteoporosis if you are a woman who is age 30 or older, or a man who is age 39 or older. This information is not intended to replace advice given to you by your health care provider. Make sure you discuss any questions you have with your health care provider. Document Revised: 06/10/2017 Document Reviewed: 04/22/2017 Elsevier Patient Education  [place].  Denosumab injection What is this medicine? DENOSUMAB (den oh sue mab) slows bone breakdown. Prolia is used to treat osteoporosis in women after menopause and in men, and in people who are taking corticosteroids for 6 months or more. Delton See is used to treat a high calcium level due to cancer and to prevent bone fractures and other bone problems caused by multiple myeloma or cancer bone metastases. Delton See is also used to treat giant cell tumor of the bone. This medicine may be used for other purposes; ask your health care provider or pharmacist if you have questions. COMMON BRAND NAME(S): Prolia, XGEVA What should I tell my health care provider before I take this medicine? They need to know if you have any of these conditions:  dental disease  having surgery or tooth extraction  infection  kidney disease  low levels of calcium or Vitamin D in the blood  malnutrition  on hemodialysis  skin conditions or sensitivity  thyroid or parathyroid disease  an unusual reaction to denosumab, other medicines, foods, dyes, or preservatives  pregnant or trying to get pregnant  breast-feeding How should I use this medicine? This medicine is for injection under the skin. It is given by a health care professional in a hospital or clinic setting. A special MedGuide will be given to you before each treatment. Be sure to read this information carefully each time. For Prolia, talk to your  pediatrician regarding the use of this medicine in children. Special care may be needed. For Delton See, talk to your pediatrician regarding the use of this medicine in children. While this drug may be prescribed for children as young as 13 years for selected conditions, precautions do apply. Overdosage: If you think you have taken too much of this medicine contact a poison control center or emergency room at once. NOTE: This medicine is only for you. Do not share this medicine with others. What if I miss a dose? It is important not to miss your dose. Call your doctor or health care professional if you are unable to keep an appointment. What may interact with this medicine? Do not take this medicine with any of the following medications:  other medicines containing denosumab This medicine may also interact with the following  medications:  medicines that lower your chance of fighting infection  steroid medicines like prednisone or cortisone This list may not describe all possible interactions. Give your health care provider a list of all the medicines, herbs, non-prescription drugs, or dietary supplements you use. Also tell them if you smoke, drink alcohol, or use illegal drugs. Some items may interact with your medicine. What should I watch for while using this medicine? Visit your doctor or health care professional for regular checks on your progress. Your doctor or health care professional may order blood tests and other tests to see how you are doing. Call your doctor or health care professional for advice if you get a fever, chills or sore throat, or other symptoms of a cold or flu. Do not treat yourself. This drug may decrease your body's ability to fight infection. Try to avoid being around people who are sick. You should make sure you get enough calcium and vitamin D while you are taking this medicine, unless your doctor tells you not to. Discuss the foods you eat and the vitamins you take with  your health care professional. See your dentist regularly. Brush and floss your teeth as directed. Before you have any dental work done, tell your dentist you are receiving this medicine. Do not become pregnant while taking this medicine or for 5 months after stopping it. Talk with your doctor or health care professional about your birth control options while taking this medicine. Women should inform their doctor if they wish to become pregnant or think they might be pregnant. There is a potential for serious side effects to an unborn child. Talk to your health care professional or pharmacist for more information. What side effects may I notice from receiving this medicine? Side effects that you should report to your doctor or health care professional as soon as possible:  allergic reactions like skin rash, itching or hives, swelling of the face, lips, or tongue  bone pain  breathing problems  dizziness  jaw pain, especially after dental work  redness, blistering, peeling of the skin  signs and symptoms of infection like fever or chills; cough; sore throat; pain or trouble passing urine  signs of low calcium like fast heartbeat, muscle cramps or muscle pain; pain, tingling, numbness in the hands or feet; seizures  unusual bleeding or bruising  unusually weak or tired Side effects that usually do not require medical attention (report to your doctor or health care professional if they continue or are bothersome):  constipation  diarrhea  headache  joint pain  loss of appetite  muscle pain  runny nose  tiredness  upset stomach This list may not describe all possible side effects. Call your doctor for medical advice about side effects. You may report side effects to FDA at 1-800-FDA-1088. Where should I keep my medicine? This medicine is only given in a clinic, doctor's office, or other health care setting and will not be stored at home. NOTE: This sheet is a summary. It  may not cover all possible information. If you have questions about this medicine, talk to your doctor, pharmacist, or health care provider.  2020 Elsevier/Gold Standard (2017-11-04 16:10:44)

## 2020-02-27 NOTE — Progress Notes (Signed)
Patient flagged: Current status:  PATIENT IS OVERDUE FOR BMI FOLLOW UP PLAN BMI is estimated to be 39.1 based on the last recorded weight and height

## 2020-02-28 ENCOUNTER — Telehealth: Payer: Self-pay | Admitting: Internal Medicine

## 2020-02-28 ENCOUNTER — Telehealth: Payer: Self-pay | Admitting: *Deleted

## 2020-02-28 NOTE — Telephone Encounter (Signed)
Patient has referral for diarrhea and iron deficiency called to schedule OV however she wants to just schedule for her recall only. I advised her I would have her speak with a nurse in reference to her symptoms and to advise if she can schedule OV or Recall.

## 2020-02-28 NOTE — Telephone Encounter (Signed)
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  This was the reason I recommended she be seen to discuss.  If she refuses EGD, colonoscopy is still recommended, but if not source found at colonoscopy EGD would be next.  Please document what she decides and if she is unclear or cannot easily decide then schedule office visit Thanks

## 2020-02-28 NOTE — Telephone Encounter (Signed)
I have spoken to patient to advise that Dr Hilarie Fredrickson recommends office visit for anemia. She has agreed to come for appointment with Janet Bogus, PA-C on 03/11/20 at 11:30 am.

## 2020-02-28 NOTE — Telephone Encounter (Signed)
Spoke with pt and she is aware and will keep the ov as scheduled.

## 2020-02-28 NOTE — Telephone Encounter (Signed)
See additional phone note from 02/28/20

## 2020-02-28 NOTE — Telephone Encounter (Signed)
-----   Message from Jerene Bears, MD sent at 02/28/2020 11:34 AM EDT ----- Janet Rice, we will get her back in to GI. We need to consider repeating EGD and colonoscopy Thanks Willis Modena, OV with me or APP to discuss IDA THanks JMP ----- Message ----- From: McLean-Scocuzza, Nino Glow, MD Sent: 02/27/2020   4:19 PM EDT To: Lloyd Huger, MD, Jerene Bears, MD  Pt taking time released iron, geritol and iron infusions Q3 months but iron still low any suggestions or do you rec changing therapy?  I am referring to Dr. Hilarie Fredrickson GI as well   02/21/2020 08:47 WBC: 5.0 RBC: 3.49 (L) Hemoglobin: 9.8 (L) HCT: 31.7 (L) MCV: 91 MCH: 28.1 MCHC: 30.9 (L) RDW: 13.4 Platelets: 154   Results for VELERA, LANSDALE (MRN [id number]) as of 02/27/2020 16:17  08/31/2018 10:11 Iron: 25 (L) UIBC: 255 TIBC: 280 Ferritin: 43 Iron Saturation: 9 (LL)  02/21/2020 08:47 Iron: 18 (L) UIBC: 282 TIBC: 300 Ferritin: 45 Iron Saturation: 6 (LL)

## 2020-02-28 NOTE — Telephone Encounter (Signed)
Pt states she has had diarrhea off and on for years along with anemia. Pt is wanting to schedule just the colon recall, states she is overdue 2 years. Please advise if ok to schedule pt for direct colon.

## 2020-03-03 DIAGNOSIS — M25552 Pain in left hip: Secondary | ICD-10-CM | POA: Diagnosis not present

## 2020-03-04 ENCOUNTER — Ambulatory Visit: Payer: Medicare Other | Admitting: Gastroenterology

## 2020-03-04 DIAGNOSIS — M1612 Unilateral primary osteoarthritis, left hip: Secondary | ICD-10-CM | POA: Diagnosis not present

## 2020-03-07 DIAGNOSIS — R030 Elevated blood-pressure reading, without diagnosis of hypertension: Secondary | ICD-10-CM | POA: Diagnosis not present

## 2020-03-07 DIAGNOSIS — Z6839 Body mass index (BMI) 39.0-39.9, adult: Secondary | ICD-10-CM | POA: Diagnosis not present

## 2020-03-07 DIAGNOSIS — M48062 Spinal stenosis, lumbar region with neurogenic claudication: Secondary | ICD-10-CM | POA: Diagnosis not present

## 2020-03-08 NOTE — Progress Notes (Signed)
[place]  [phone]  Fax:([phone]) [place] DOB: 1946-01-08  MR#: [id number]  LEX#:[id number]  Patient Care Team: McLean-Scocuzza, Nino Glow, MD as PCP - General (Internal Medicine)  I connected with Janet Rice on 03/13/20 at 10:45 AM EDT by video enabled telemedicine visit and verified that I am speaking with the correct person using two identifiers.   I discussed the limitations, risks, security and privacy concerns of performing an evaluation and management service by telemedicine and the availability of in-person appointments. I also discussed with the patient that there may be a patient responsible charge related to this service. The patient expressed understanding and agreed to proceed.   Other persons participating in the visit and their role in the encounter: Patient, MD.  Patient's location: Clinic. Provider's location: Home.  CHIEF COMPLAINT: Iron deficiency anemia.  INTERVAL HISTORY: Patient agreed to video assisted telemedicine visit for further evaluation and consideration of additional IV Feraheme.  She continues to have chronic weakness and fatigue.  She also has left hip pain and will undergo hip replacement in mid September.  She otherwise feels well.  She has no neurologic complaints. She denies any recent fevers or illnesses. She has a good appetite and denies weight loss.  She denies any chest pain, shortness of breath, cough, or hemoptysis.  She denies any nausea, vomiting, constipation, or diarrhea. She has no melena or hematochezia. She has no urinary complaints.  Patient offers no further specific complaints today.  REVIEW OF SYSTEMS:   Review of Systems  Constitutional: Positive for malaise/fatigue. Negative for fever and weight loss.  Eyes: Negative.   Respiratory: Negative.  Negative for cough and shortness of breath.   Cardiovascular: Negative.  Negative for chest pain and leg swelling.  Gastrointestinal:  Negative.  Negative for abdominal pain, blood in stool and melena.  Genitourinary: Negative.  Negative for hematuria.  Musculoskeletal: Positive for joint pain. Negative for back pain.  Skin: Negative.  Negative for rash.  Neurological: Positive for weakness. Negative for dizziness, sensory change and focal weakness.  Psychiatric/Behavioral: Negative.  The patient is not nervous/anxious.     As per HPI. Otherwise, a complete review of systems is negative.  ONCOLOGY HISTORY: Oncology History   No history exists.    PAST MEDICAL HISTORY: Past Medical History:  Diagnosis Date  . Adenomatous colon polyp   . Arthritis   . Childhood asthma    AS A CHILD  . Chronic heartburn    On omeprazole  . Complication of anesthesia    HARD TO WAKE UP  . Dyspnea    low hgb. and iron  chronic problem  . External hemorrhoids   . Frequency of urination   . Frequent loose stools   . GERD (gastroesophageal reflux disease)   . Heme positive stool   . History of hiatal hernia   . Hyperlipidemia   . Hypothyroidism   . Iron deficiency anemia   . Melanoma ([place])    left thigh s/p LN removal left groin  . Metastatic melanoma ([place])    Followed by Dr Oliva Bustard, previous chemo, no recurrence, 2008?  . Multinodular goiter   . SCC (squamous cell carcinoma)    skin    PAST SURGICAL HISTORY: Past Surgical History:  Procedure Laterality Date  . ABDOMINAL HYSTERECTOMY  1984  . BACK SURGERY  03/2016   LUMBAR  . CARPAL TUNNEL RELEASE Right 09/07/2016   Procedure: CARPAL TUNNEL RELEASE;  Surgeon: Thornton Park,  MD;  Location: [facility];  Service: Orthopedics;  Laterality: Right;  . CHOLECYSTECTOMY  1990  . COLONOSCOPY  08/2006   Four sessile polyps found and removed in sigmoid colon at splenic flexure and ascending colon. 7 mm in size. Another removed from transverse colon 4 mm. Path Report - showed tubular adenoma and hyperplastic polyp. Advised to repeat in 3.5 years (02/2010)  . COLONOSCOPY  3.2.2011    8 mm polyp in sigmoid colon, removed, 4 mm polyp in descending colon, removed. Internal hemorrhoids  . ESOPHAGOGASTRODUODENOSCOPY  3.2.2011   Large hiatia hernia, esophagus normal, mulitple small sessile polyps w/no stigmata of recent bleeding found. Mildy erythermatous mucosa w/no bleeding found in gatric antrum. Normal duodenum. 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 PATH - no active inflammation, antral mucosa w/mild foveolar hyperplasia  . FRACTURE SURGERY    . Lymph Node Removal  12/2005   Left inguinal lymph node dissection  . MELANOMA EXCISION Left 1993   Left thigh  . ORIF ANKLE FRACTURE Right    Dr. Mack Guise  . TOTAL HIP ARTHROPLASTY Right 11/18/2016   Procedure: TOTAL HIP ARTHROPLASTY;  Surgeon: Thornton Park, MD;  Location: [facility];  Service: Orthopedics;  Laterality: Right;    FAMILY HISTORY Family History  Problem Relation Age of Onset  . Aneurysm Mother   . Heart disease Mother   . Colon polyps Father   . Diabetes Father   . Dementia Father   . Stroke Father        TIA  . Leukemia Maternal Grandfather   . Skin cancer Paternal Grandfather   . Diabetes Other   . Colon polyps Other   . Colon cancer Neg Hx   . Breast cancer Neg Hx     GYNECOLOGIC HISTORY:  No LMP recorded. Patient has had a hysterectomy.     ADVANCED DIRECTIVES:    HEALTH MAINTENANCE: Social History   Tobacco Use  . Smoking status: Former Smoker    Packs/day: 0.50    Years: 8.00    Pack years: 4.00    Types: Cigarettes    Quit date: 07/12/1978    Years since quitting: 41.6  . Smokeless tobacco: Never Used  Vaping Use  . Vaping Use: Never used  Substance Use Topics  . Alcohol use: No    Alcohol/week: 0.0 standard drinks  . Drug use: No    Allergies  Allergen Reactions  . Sulfa Antibiotics Rash    Current Outpatient Medications  Medication Sig Dispense Refill  . acetaminophen (TYLENOL 8 HOUR ARTHRITIS PAIN) 650 MG CR tablet Take 1,300 mg by mouth daily.     .  Calcium Carbonate-Vitamin D 600-400 MG-UNIT tablet Take by mouth.    . celecoxib (CELEBREX) 200 MG capsule     . citalopram (CELEXA) 20 MG tablet Take 1 tablet (20 mg total) by mouth daily. 90 tablet 3  . Coenzyme Q10 50 MG CAPS Take by mouth.    . Ferrous Sulfate Dried (SLOW RELEASE IRON) 45 MG TBCR Take 45 mg by mouth daily with lunch.    . Iron-Vitamins (GERITOL) LIQD Take by mouth.    . levothyroxine (SYNTHROID) 125 MCG tablet Take 1 tablet (125 mcg total) by mouth daily before breakfast. Skip sundays 90 tablet 3  . loperamide (IMODIUM) 2 MG capsule Take 2 mg by mouth every morning.    . Multiple Vitamins-Minerals (EYE VITAMINS & MINERALS) TABS Take 1 tablet by mouth daily.     . NON FORMULARY Apply 1 application  topically 2 (two) times daily as needed (muscle pain). Horse Liniment    . pravastatin (PRAVACHOL) 40 MG tablet Take 0.5 tablets (20 mg total) by mouth daily. 45 tablet 3  . Propylene Glycol (SYSTANE BALANCE OP) Place 1 drop into both eyes daily as needed (dry eyes).    . Sodium Sulfate-Mag Sulfate-KCl (SUTAB) [phone] MG TABS Take 1 kit by mouth as directed. 24 tablet 0   No current facility-administered medications for this visit.    OBJECTIVE: There were no vitals taken for this visit.   There is no height or weight on file to calculate BMI.    ECOG FS:0 - Asymptomatic  General: Well-developed, well-nourished, no acute distress. Eyes: Pink conjunctiva, anicteric sclera. HEENT: Normocephalic, moist mucous membranes. Lungs: No audible wheezing or coughing. Heart: Regular rate and rhythm. Abdomen: Soft, nontender, no obvious distention. Musculoskeletal: No edema, cyanosis, or clubbing. Neuro: Alert, answering all questions appropriately. Cranial nerves grossly intact. Skin: No rashes or petechiae noted. Psych: Normal affect.   LAB RESULTS:  December 27, 2016: WBC 6.6, hemoglobin 10.4, platelet 144. Total iron 20, iron saturation 7%, ferritin 55. April 27, 2017: WBC  6.8, hemoglobin 11.3, platelets 151. Total iron 26, iron saturation 8%, ferritin 24. September 23, 2017: WBC 7.5, hemoglobin 10.9, platelet 165.  Total iron 18, iron saturation 5%, ferritin 21. December 22, 2017: WBC 6.9, hemoglobin 10.3, platelets 159.  Total iron 22, iron saturation 7%, ferritin 22. April 11, 2018: WBC 8.1, hemoglobin 11.5, platelets 190.  Total iron 25, iron saturation 7%, ferrin 24. September 14, 2018: WBC 8.1, hemoglobin 10.2, platelets 192.  Total iron 20, iron saturation 7%, ferritin 32.  CBC    Component Value Date/Time   WBC 5.0 02/21/2020 0847   WBC 6.4 12/31/2018 0611   RBC 3.49 (L) 02/21/2020 0847   RBC 2.89 (L) 12/31/2018 0611   HGB 9.8 (L) 02/21/2020 0847   HCT 31.7 (L) 02/21/2020 0847   PLT 154 02/21/2020 0847   MCV 91 02/21/2020 0847   MCV 74 (L) 12/12/2013 1015   MCH 28.1 02/21/2020 0847   MCH 25.3 (L) 12/31/2018 0611   MCHC 30.9 (L) 02/21/2020 0847   MCHC 29.1 (L) 12/31/2018 0611   RDW 13.4 02/21/2020 0847   RDW 33.6 (H) 12/12/2013 1015   LYMPHSABS 0.8 02/21/2020 0847   LYMPHSABS 1.2 12/12/2013 1015   MONOABS 0.4 12/30/2018 1125   MONOABS 0.4 12/12/2013 1015   EOSABS 0.1 02/21/2020 0847   EOSABS 0.1 12/12/2013 1015   BASOSABS 0.0 02/21/2020 0847   BASOSABS 0.1 12/12/2013 1015   Lab Results  Component Value Date   IRON 18 (L) 02/21/2020   TIBC 300 02/21/2020   IRONPCTSAT 6 (LL) 02/21/2020   Lab Results  Component Value Date   FERRITIN 45 02/21/2020      STUDIES: No results found.  ASSESSMENT & PLAN:    1. Iron deficiency anemia: Patient's hemoglobin is decreased at 9.8 along with her iron stores.  Proceed with 510 mg IV Feraheme today.  Patient is going out of town next week, therefore will return to clinic in 2 weeks for second infusion.  She will then return to clinic in approximately 1 month after her hip replacement surgery for repeat laboratory work, further evaluation, and consideration of additional Feraheme.  Of note, patient also had an  appointment with GI yesterday and is scheduled for colonoscopy in mid October. 2. History of melanoma: Melanoma of left thigh status post resection in 1993, exact stage is not known,  metastatic to inguinal lymph node. No evidence of recurrent disease. 3.  Left hip pain: Patient will undergo hip replacement surgery in mid-September. 4. Hypertension: Blood pressure continues to be within normal limits.   I provided 30 minutes of face-to-face video visit time during this encounter which included chart review, counseling, and coordination of care as documented above.    Patient expressed understanding and was in agreement with this plan. She also understands that She can call clinic at any time with any questions, concerns, or complaints.    Lloyd Huger, MD   03/13/2020 8:46 AM

## 2020-03-11 ENCOUNTER — Ambulatory Visit (INDEPENDENT_AMBULATORY_CARE_PROVIDER_SITE_OTHER): Payer: Medicare Other | Admitting: Gastroenterology

## 2020-03-11 ENCOUNTER — Encounter: Payer: Self-pay | Admitting: Gastroenterology

## 2020-03-11 ENCOUNTER — Encounter: Payer: Self-pay | Admitting: Oncology

## 2020-03-11 VITALS — BP 94/60 | HR 70 | Ht 62.0 in | Wt 201.4 lb

## 2020-03-11 DIAGNOSIS — K529 Noninfective gastroenteritis and colitis, unspecified: Secondary | ICD-10-CM

## 2020-03-11 DIAGNOSIS — Z860100 Personal history of colon polyps, unspecified: Secondary | ICD-10-CM

## 2020-03-11 DIAGNOSIS — D649 Anemia, unspecified: Secondary | ICD-10-CM | POA: Diagnosis not present

## 2020-03-11 MED ORDER — SUTAB 1479-225-188 MG PO TABS: 1.0000 | ORAL_TABLET | ORAL | 0 refills | Status: DC

## 2020-03-11 NOTE — Progress Notes (Signed)
03/11/2020 Janet Rice [id number] Aug 02, 1945   HISTORY OF PRESENT ILLNESS: This is a pleasant 74 year old female who has been referred here by her PCP, Dr. Terese Door, for evaluation of anemia.  She has been seen here for iron deficiency anemia in the past.  She follows with oncology for this as well.  Her most recent hemoglobin is actually 9.8 g, which is the highest that it has been in our system for quite some time.  In June 2020 was down to 5.4 g.  Her MCV is normal.  Her iron saturation was 6% and serum iron was 18.  TIBC is normal at 300 and ferritin is low normal at 45.  She has received iron infusions in the past.  Last colonoscopy in March 2016 as follows:  1. The examined terminal ileum appeared to be normal 2. Five sessile polyps ranging from 3 to 34mm in size were found at the cecum, in the ascending colon, and sigmoid colon; polypectomies were performed with cold forceps and with a cold snare 3. There was mild diverticulosis noted in the transverse colon and sigmoid colon 4. Previously placed tattoo seen in the sigmoid colon with no residual polyp identified  Was supposed to have a repeat in 3 years, March 2019.  Endoscopy in March 2013 for evaluation of iron deficiency anemia showed a large 6 cm hiatal hernia.  Also had gastric polyps.  Polyps were fundic gland polyps.  Small bowel biopsies were normal.  Capsule endoscopy performed for iron deficiency anemia in May 2013 was essentially unremarkable.  While she is here she also reports chronic diarrhea.  It has been going on intermittently for years.  Also describes associated bloating.  Uses Imodium as needed.  She does not have a gallbladder.  She tells me that she is having hip replacement in a couple of weeks and would like to hold off on these procedures until after her surgery.   Past Medical History:  Diagnosis Date  . Adenomatous colon polyp   . Arthritis   . Childhood asthma    AS A CHILD  .  Chronic heartburn    On omeprazole  . Complication of anesthesia    HARD TO WAKE UP  . Dyspnea    low hgb. and iron  chronic problem  . External hemorrhoids   . Frequency of urination   . Frequent loose stools   . GERD (gastroesophageal reflux disease)   . Heme positive stool   . History of hiatal hernia   . Hyperlipidemia   . Hypothyroidism   . Iron deficiency anemia   . Melanoma ([place])    left thigh s/p LN removal left groin  . Metastatic melanoma ([place])    Followed by Dr Oliva Bustard, previous chemo, no recurrence, 2008?  . Multinodular goiter   . SCC (squamous cell carcinoma)    skin   Past Surgical History:  Procedure Laterality Date  . ABDOMINAL HYSTERECTOMY  1984  . BACK SURGERY  03/2016   LUMBAR  . CARPAL TUNNEL RELEASE Right 09/07/2016   Procedure: CARPAL TUNNEL RELEASE;  Surgeon: Thornton Park, MD;  Location: [facility];  Service: Orthopedics;  Laterality: Right;  . CHOLECYSTECTOMY  1990  . COLONOSCOPY  08/2006   Four sessile polyps found and removed in sigmoid colon at splenic flexure and ascending colon. 7 mm in size. Another removed from transverse colon 4 mm. Path Report - showed tubular adenoma and hyperplastic polyp. Advised to repeat in 3.5 years (02/2010)  .  COLONOSCOPY  3.2.2011   8 mm polyp in sigmoid colon, removed, 4 mm polyp in descending colon, removed. Internal hemorrhoids  . ESOPHAGOGASTRODUODENOSCOPY  3.2.2011   Large hiatia hernia, esophagus normal, mulitple small sessile polyps w/no stigmata of recent bleeding found. Mildy erythermatous mucosa w/no bleeding found in gatric antrum. Normal duodenum. 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 PATH - no active inflammation, antral mucosa w/mild foveolar hyperplasia  . FRACTURE SURGERY    . Lymph Node Removal  12/2005   Left inguinal lymph node dissection  . MELANOMA EXCISION Left 1993   Left thigh  . ORIF ANKLE FRACTURE Right    Dr. Mack Guise  . TOTAL HIP ARTHROPLASTY Right 11/18/2016   Procedure:  TOTAL HIP ARTHROPLASTY;  Surgeon: Thornton Park, MD;  Location: [facility];  Service: Orthopedics;  Laterality: Right;    reports that she quit smoking about 41 years ago. Her smoking use included cigarettes. She has a 4.00 pack-year smoking history. She has never used smokeless tobacco. She reports that she does not drink alcohol and does not use drugs. family history includes Aneurysm in her mother; Colon polyps in her father and another family member; Dementia in her father; Diabetes in her father and another family member; Heart disease in her mother; Leukemia in her maternal grandfather; Skin cancer in her paternal grandfather; Stroke in her father. Allergies  Allergen Reactions  . Sulfa Antibiotics Rash      Outpatient Encounter Medications as of 03/11/2020  Medication Sig  . acetaminophen (TYLENOL 8 HOUR ARTHRITIS PAIN) 650 MG CR tablet Take 1,300 mg by mouth daily.   . Calcium Carbonate-Vitamin D 600-400 MG-UNIT tablet Take by mouth.  . celecoxib (CELEBREX) 200 MG capsule   . citalopram (CELEXA) 20 MG tablet Take 1 tablet (20 mg total) by mouth daily.  . Coenzyme Q10 50 MG CAPS Take by mouth.  . Ferrous Sulfate Dried (SLOW RELEASE IRON) 45 MG TBCR Take 45 mg by mouth daily with lunch.  . Iron-Vitamins (GERITOL) LIQD Take by mouth.  . levothyroxine (SYNTHROID) 125 MCG tablet Take 1 tablet (125 mcg total) by mouth daily before breakfast. Skip sundays  . loperamide (IMODIUM) 2 MG capsule Take 2 mg by mouth every morning.  . Multiple Vitamins-Minerals (EYE VITAMINS & MINERALS) TABS Take 1 tablet by mouth daily.   . NON FORMULARY Apply 1 application topically 2 (two) times daily as needed (muscle pain). Horse Liniment  . pravastatin (PRAVACHOL) 40 MG tablet Take 0.5 tablets (20 mg total) by mouth daily.  Marland Kitchen Propylene Glycol (SYSTANE BALANCE OP) Place 1 drop into both eyes daily as needed (dry eyes).  . [DISCONTINUED] Calcium Carb-Cholecalciferol (CALCIUM 600+D) 600-800 MG-UNIT TABS Take 1  tablet by mouth daily with lunch.   No facility-administered encounter medications on file as of 03/11/2020.     REVIEW OF SYSTEMS  : All other systems reviewed and negative except where noted in the History of Present Illness.   PHYSICAL EXAM: BP 94/60 (BP Location: Left Arm, Patient Position: Sitting, Cuff Size: Large)   Pulse 70   Ht 5\' 2"  (1.575 m)   Wt 201 lb 6 oz (91.3 kg)   BMI 36.83 kg/m  General: Well developed white female in no acute distress Head: Normocephalic and atraumatic Eyes:  Sclerae anicteric, conjunctiva pink. Ears: Normal auditory acuity Lungs: Clear throughout to auscultation; no W/R/R. Heart: Regular rate and rhythm; no M/R/G. Abdomen: Soft, non-distended.  BS present.  Non-tender. Rectal:  Will be done at the time of colonoscopy.  Musculoskeletal: Symmetrical with no gross deformities  Skin: No lesions on visible extremities Extremities: No edema  Neurological: Alert oriented x 4, grossly non-focal Psychological:  Alert and cooperative. Normal mood and affect  ASSESSMENT AND PLAN: *Iron deficiency anemia: Hemoglobin is 9.8 g, which is actually the best that it has been in quite some time.  Was down to 5.4 g in 2020.  MCV is normal.  Iron saturation and serum iron are low, but TIBC and ferritin are normal.  She follows with oncology as well.  This has been an issue in the remote past and she was evaluated in 2013 for iron deficiency anemia.  Was found to have a 6 cm hiatal hernia on endoscopy.  Question if she has Cameron's ulcers that could be contributing to her anemia now.  Video capsule endoscopy in 2013 was negative.  Will plan for EGD and colonoscopy with Dr. Hilarie Fredrickson. *Chronic diarrhea: Question if it could be bile salt related status post cholecystectomy.  Could consider a trial of Questran.  She would like to hold off for now until after colonoscopy.  Consider random biopsies to rule out microscopic colitis. *Personal history of colon polyps.  Was due for  repeat colonoscopy in March 2019.  We will plan for colonoscopy as above.  **The risks, benefits, and alternatives to EGD and colonoscopy were discussed with the patient and she consents to proceed.   CC:  McLean-Scocuzza, Olivia Mackie *

## 2020-03-11 NOTE — Progress Notes (Signed)
Patient denied questions or concerns at this time.

## 2020-03-11 NOTE — Patient Instructions (Signed)
If you are age 74 or older, your body mass index should be between 23-30. Your Body mass index is 36.83 kg/m. If this is out of the aforementioned range listed, please consider follow up with your Primary Care Provider.  If you are age 36 or younger, your body mass index should be between 19-25. Your Body mass index is 36.83 kg/m. If this is out of the aformentioned range listed, please consider follow up with your Primary Care Provider.   You have been scheduled for a colonoscopy. Please follow written instructions given to you at your visit today.  Please pick up your prep supplies at the pharmacy within the next 1-3 days. If you use inhalers (even only as needed), please bring them with you on the day of your procedure.  Due to recent changes in healthcare laws, you may see the results of your imaging and laboratory studies on MyChart before your provider has had a chance to review them.  We understand that in some cases there may be results that are confusing or concerning to you. Not all laboratory results come back in the same time frame and the provider may be waiting for multiple results in order to interpret others.  Please give [place] 48 hours in order for your provider to thoroughly review all the results before contacting the office for clarification of your results.   Follow up pending the results of your Colonoscopy.

## 2020-03-12 ENCOUNTER — Inpatient Hospital Stay: Payer: Medicare Other | Attending: Oncology | Admitting: Oncology

## 2020-03-12 ENCOUNTER — Other Ambulatory Visit: Payer: Self-pay

## 2020-03-12 ENCOUNTER — Inpatient Hospital Stay: Payer: Medicare Other

## 2020-03-12 VITALS — BP 108/57 | HR 76 | Resp 18

## 2020-03-12 DIAGNOSIS — D509 Iron deficiency anemia, unspecified: Secondary | ICD-10-CM | POA: Diagnosis not present

## 2020-03-12 DIAGNOSIS — D508 Other iron deficiency anemias: Secondary | ICD-10-CM

## 2020-03-12 MED ORDER — SODIUM CHLORIDE 0.9 % IV SOLN
Freq: Once | INTRAVENOUS | Status: AC
  Filled 2020-03-12: qty 250

## 2020-03-12 MED ORDER — SODIUM CHLORIDE 0.9 % IV SOLN
510.0000 mg | Freq: Once | INTRAVENOUS | Status: AC
  Administered 2020-03-12: 510 mg via INTRAVENOUS
  Filled 2020-03-12: qty 510

## 2020-03-13 NOTE — Progress Notes (Signed)
Patients only recorded Zoster Vaccine is on 10/10/2012

## 2020-03-13 NOTE — Progress Notes (Signed)
Correction. Not 10/10/2012. Her vaccine was dated for 08/15/2012.

## 2020-03-20 ENCOUNTER — Encounter: Payer: Self-pay | Admitting: Gastroenterology

## 2020-03-20 DIAGNOSIS — K529 Noninfective gastroenteritis and colitis, unspecified: Secondary | ICD-10-CM | POA: Insufficient documentation

## 2020-03-24 ENCOUNTER — Inpatient Hospital Stay: Payer: Medicare Other

## 2020-03-24 ENCOUNTER — Other Ambulatory Visit: Payer: Self-pay

## 2020-03-24 VITALS — BP 108/67 | HR 90 | Temp 98.8°F | Resp 18

## 2020-03-24 DIAGNOSIS — D508 Other iron deficiency anemias: Secondary | ICD-10-CM

## 2020-03-24 DIAGNOSIS — M1612 Unilateral primary osteoarthritis, left hip: Secondary | ICD-10-CM | POA: Diagnosis not present

## 2020-03-24 DIAGNOSIS — D509 Iron deficiency anemia, unspecified: Secondary | ICD-10-CM | POA: Diagnosis not present

## 2020-03-24 MED ORDER — SODIUM CHLORIDE 0.9 % IV SOLN
Freq: Once | INTRAVENOUS | Status: AC
  Filled 2020-03-24: qty 250

## 2020-03-24 MED ORDER — SODIUM CHLORIDE 0.9 % IV SOLN
510.0000 mg | Freq: Once | INTRAVENOUS | Status: AC
  Administered 2020-03-24: 510 mg via INTRAVENOUS
  Filled 2020-03-24: qty 510

## 2020-03-26 ENCOUNTER — Encounter: Payer: Self-pay | Admitting: Oncology

## 2020-03-26 DIAGNOSIS — M1612 Unilateral primary osteoarthritis, left hip: Secondary | ICD-10-CM | POA: Diagnosis not present

## 2020-03-26 DIAGNOSIS — Z01812 Encounter for preprocedural laboratory examination: Secondary | ICD-10-CM | POA: Diagnosis not present

## 2020-03-31 NOTE — Progress Notes (Signed)
Addendum: Reviewed and agree with assessment and management plan. Pyrtle, Jay M, MD  

## 2020-04-03 ENCOUNTER — Encounter: Payer: Self-pay | Admitting: Internal Medicine

## 2020-04-10 ENCOUNTER — Ambulatory Visit: Payer: Medicare Other | Admitting: Physician Assistant

## 2020-04-10 NOTE — Progress Notes (Signed)
[place]  [phone]  [phone]     Janet Rice DOB: February 14, 1946  MR#: [id number]  AVW#:[id number]  Patient Care Team: McLean-Scocuzza, Nino Glow, MD as PCP - General (Internal Medicine) Lloyd Huger, MD as Consulting Physician (Hematology and Oncology)   CHIEF COMPLAINT: Iron deficiency anemia.  INTERVAL HISTORY: Patient returns to clinic today for repeat laboratory work and further evaluation.  She feels significantly improved after receiving Feraheme several months ago.  She continues to have significant left hip pain and is undergoing hip replacement surgery at the end of October.  She otherwise feels well.  She has no neurologic complaints. She denies any recent fevers or illnesses. She has a good appetite and denies weight loss.  She denies any chest pain, shortness of breath, cough, or hemoptysis.  She denies any nausea, vomiting, constipation, or diarrhea. She has no melena or hematochezia. She has no urinary complaints.  Patient offers no further specific complaints today.  REVIEW OF SYSTEMS:   Review of Systems  Constitutional: Negative.  Negative for fever, malaise/fatigue and weight loss.  Eyes: Negative.   Respiratory: Negative.  Negative for cough and shortness of breath.   Cardiovascular: Negative.  Negative for chest pain and leg swelling.  Gastrointestinal: Negative.  Negative for abdominal pain, blood in stool and melena.  Genitourinary: Negative.  Negative for hematuria.  Musculoskeletal: Positive for joint pain. Negative for back pain.  Skin: Negative.  Negative for rash.  Neurological: Negative.  Negative for dizziness, sensory change, focal weakness and weakness.  Psychiatric/Behavioral: Negative.  The patient is not nervous/anxious.     As per HPI. Otherwise, a complete review of systems is negative.  ONCOLOGY HISTORY: Oncology History   No history exists.    PAST MEDICAL HISTORY: Past Medical History:   Diagnosis Date  . Adenomatous colon polyp   . Arthritis   . Childhood asthma    AS A CHILD  . Chronic heartburn    On omeprazole  . Complication of anesthesia    HARD TO WAKE UP  . Dyspnea    low hgb. and iron  chronic problem  . External hemorrhoids   . Frequency of urination   . Frequent loose stools   . GERD (gastroesophageal reflux disease)   . Heme positive stool   . History of hiatal hernia   . Hyperlipidemia   . Hypothyroidism   . Iron deficiency anemia   . Melanoma ([place])    left thigh s/p LN removal left groin  . Metastatic melanoma ([place])    Followed by Dr Oliva Bustard, previous chemo, no recurrence, 2008?  . Multinodular goiter   . SCC (squamous cell carcinoma)    skin    PAST SURGICAL HISTORY: Past Surgical History:  Procedure Laterality Date  . ABDOMINAL HYSTERECTOMY  1984  . BACK SURGERY  03/2016   LUMBAR  . CARPAL TUNNEL RELEASE Right 09/07/2016   Procedure: CARPAL TUNNEL RELEASE;  Surgeon: Thornton Park, MD;  Location: [facility] ORS;  Service: Orthopedics;  Laterality: Right;  . CHOLECYSTECTOMY  1990  . COLONOSCOPY  08/2006   Four sessile polyps found and removed in sigmoid colon at splenic flexure and ascending colon. 7 mm in size. Another removed from transverse colon 4 mm. Path Report - showed tubular adenoma and hyperplastic polyp. Advised to repeat in 3.5 years (02/2010)  . COLONOSCOPY  3.2.2011   8 mm polyp in sigmoid colon, removed, 4 mm polyp in descending colon, removed. Internal hemorrhoids  . ESOPHAGOGASTRODUODENOSCOPY  3.2.2011   Large hiatia hernia, esophagus normal, mulitple small sessile polyps w/no stigmata of recent bleeding found. Mildy erythermatous mucosa w/no bleeding found in gatric antrum. Normal duodenum. 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 PATH - no active inflammation, antral mucosa w/mild foveolar hyperplasia  . FRACTURE SURGERY    . Lymph Node Removal  12/2005   Left inguinal lymph node dissection  . MELANOMA EXCISION  Left 1993   Left thigh  . ORIF ANKLE FRACTURE Right    Dr. Mack Guise  . TOTAL HIP ARTHROPLASTY Right 11/18/2016   Procedure: TOTAL HIP ARTHROPLASTY;  Surgeon: Thornton Park, MD;  Location: [facility] ORS;  Service: Orthopedics;  Laterality: Right;    FAMILY HISTORY Family History  Problem Relation Age of Onset  . Aneurysm Mother   . Heart disease Mother   . Colon polyps Father   . Diabetes Father   . Dementia Father   . Stroke Father        TIA  . Leukemia Maternal Grandfather   . Skin cancer Paternal Grandfather   . Diabetes Other   . Colon polyps Other   . Colon cancer Neg Hx   . Breast cancer Neg Hx     GYNECOLOGIC HISTORY:  No LMP recorded. Patient has had a hysterectomy.     ADVANCED DIRECTIVES:    HEALTH MAINTENANCE: Social History   Tobacco Use  . Smoking status: Former Smoker    Packs/day: 0.50    Years: 8.00    Pack years: 4.00    Types: Cigarettes    Quit date: 07/12/1978    Years since quitting: 41.7  . Smokeless tobacco: Never Used  Vaping Use  . Vaping Use: Never used  Substance Use Topics  . Alcohol use: No    Alcohol/week: 0.0 standard drinks  . Drug use: No    Allergies  Allergen Reactions  . Sulfa Antibiotics Rash    Current Outpatient Medications  Medication Sig Dispense Refill  . acetaminophen (TYLENOL 8 HOUR ARTHRITIS PAIN) 650 MG CR tablet Take 1,300 mg by mouth daily.     . Calcium Carbonate-Vitamin D 600-400 MG-UNIT tablet Take by mouth.    . celecoxib (CELEBREX) 200 MG capsule     . citalopram (CELEXA) 20 MG tablet Take 1 tablet (20 mg total) by mouth daily. 90 tablet 3  . Coenzyme Q10 50 MG CAPS Take by mouth.    . Ferrous Sulfate Dried (SLOW RELEASE IRON) 45 MG TBCR Take 45 mg by mouth daily with lunch.    . Iron-Vitamins (GERITOL) LIQD Take by mouth.    . levothyroxine (SYNTHROID) 125 MCG tablet Take 1 tablet (125 mcg total) by mouth daily before breakfast. Skip sundays 90 tablet 3  . loperamide (IMODIUM) 2 MG capsule Take 2 mg  by mouth every morning.    . Multiple Vitamins-Minerals (EYE VITAMINS & MINERALS) TABS Take 1 tablet by mouth daily.     . NON FORMULARY Apply 1 application topically 2 (two) times daily as needed (muscle pain). Horse Liniment    . pravastatin (PRAVACHOL) 40 MG tablet Take 0.5 tablets (20 mg total) by mouth daily. 45 tablet 3  . Propylene Glycol (SYSTANE BALANCE OP) Place 1 drop into both eyes daily as needed (dry eyes).    . Sodium Sulfate-Mag Sulfate-KCl (SUTAB) [phone] MG TABS Take 1 kit by mouth as directed. 24 tablet 0   No current facility-administered medications for this visit.   Facility-Administered Medications Ordered in Other Visits  Medication Dose Route  Frequency Provider Last Rate Last Admin  . 0.9 %  sodium chloride infusion   Intravenous Continuous Lloyd Huger, MD 20 mL/hr at 04/16/20 1211 New Bag at 04/16/20 1211    OBJECTIVE: BP 127/70 (BP Location: Left Arm, Patient Position: Sitting, Cuff Size: Large)   Pulse 62   Temp 98.7 F (37.1 C) (Tympanic)   Resp 20   Ht 5' 2"  (1.575 m)   Wt 206 lb 1.6 oz (93.5 kg)   SpO2 97%   BMI 37.70 kg/m    Body mass index is 37.7 kg/m.    ECOG FS:0 - Asymptomatic  General: Well-developed, well-nourished, no acute distress. Eyes: Pink conjunctiva, anicteric sclera. HEENT: Normocephalic, moist mucous membranes. Lungs: No audible wheezing or coughing. Heart: Regular rate and rhythm. Abdomen: Soft, nontender, no obvious distention. Musculoskeletal: No edema, cyanosis, or clubbing. Neuro: Alert, answering all questions appropriately. Cranial nerves grossly intact. Skin: No rashes or petechiae noted. Psych: Normal affect.   LAB RESULTS:  CBC    Component Value Date/Time   WBC 8.6 04/16/2020 1100   RBC 4.00 04/16/2020 1100   HGB 11.3 (L) 04/16/2020 1100   HGB 9.8 (L) 02/21/2020 0847   HCT 35.8 (L) 04/16/2020 1100   HCT 31.7 (L) 02/21/2020 0847   PLT 119 (L) 04/16/2020 1100   PLT 154 02/21/2020 0847   MCV 89.5  04/16/2020 1100   MCV 91 02/21/2020 0847   MCV 74 (L) 12/12/2013 1015   MCH 28.3 04/16/2020 1100   MCHC 31.6 04/16/2020 1100   RDW 14.7 04/16/2020 1100   RDW 13.4 02/21/2020 0847   RDW 33.6 (H) 12/12/2013 1015   LYMPHSABS 1.0 04/16/2020 1100   LYMPHSABS 0.8 02/21/2020 0847   LYMPHSABS 1.2 12/12/2013 1015   MONOABS 0.5 04/16/2020 1100   MONOABS 0.4 12/12/2013 1015   EOSABS 0.1 04/16/2020 1100   EOSABS 0.1 02/21/2020 0847   EOSABS 0.1 12/12/2013 1015   BASOSABS 0.0 04/16/2020 1100   BASOSABS 0.0 02/21/2020 0847   BASOSABS 0.1 12/12/2013 1015   Lab Results  Component Value Date   IRON 27 (L) 04/16/2020   TIBC 301 04/16/2020   IRONPCTSAT 9 (L) 04/16/2020   Lab Results  Component Value Date   FERRITIN 87 04/16/2020      STUDIES: No results found.  ASSESSMENT & PLAN:    1. Iron deficiency anemia: Although patient's hemoglobin and iron stores have significantly improved, they remain decreased.  Patient is also undergoing hip replacement surgery on May 04, 2020.  Proceed with 1 additional unit of IV Feraheme today.  Return to clinic in 3 months with repeat laboratory work, further evaluation, and consideration of additional IV iron.  Of note, patient also has a colonoscopy scheduled for later this month on May 02, 2020.  Patient typically gets her laboratory work done at [facility].   2. History of melanoma: Melanoma of left thigh status post resection in 1993, exact stage is not known, metastatic to inguinal lymph node. No evidence of recurrent disease. 3.  Left hip pain: Hip replacement surgery scheduled for May 04, 2020.   I spent a total of 30 minutes reviewing chart data, face-to-face evaluation with the patient, counseling and coordination of care as detailed above.    Patient expressed understanding and was in agreement with this plan. She also understands that She can call clinic at any time with any questions, concerns, or complaints.    Lloyd Huger,  MD   04/16/2020 2:11 PM

## 2020-04-15 ENCOUNTER — Other Ambulatory Visit: Payer: Self-pay | Admitting: *Deleted

## 2020-04-15 ENCOUNTER — Encounter: Payer: Self-pay | Admitting: Oncology

## 2020-04-15 DIAGNOSIS — D508 Other iron deficiency anemias: Secondary | ICD-10-CM

## 2020-04-15 NOTE — Progress Notes (Signed)
Patient called at pre assessment. Denies concerns or pain at this time.

## 2020-04-16 ENCOUNTER — Other Ambulatory Visit: Payer: Self-pay

## 2020-04-16 ENCOUNTER — Inpatient Hospital Stay: Payer: Medicare Other

## 2020-04-16 ENCOUNTER — Inpatient Hospital Stay: Payer: Medicare Other | Attending: Oncology | Admitting: Oncology

## 2020-04-16 ENCOUNTER — Encounter: Payer: Self-pay | Admitting: Oncology

## 2020-04-16 VITALS — BP 122/67 | HR 53 | Temp 98.0°F | Resp 18

## 2020-04-16 VITALS — BP 127/70 | HR 62 | Temp 98.7°F | Resp 20 | Ht 62.0 in | Wt 206.1 lb

## 2020-04-16 DIAGNOSIS — D508 Other iron deficiency anemias: Secondary | ICD-10-CM

## 2020-04-16 DIAGNOSIS — D509 Iron deficiency anemia, unspecified: Secondary | ICD-10-CM | POA: Diagnosis not present

## 2020-04-16 LAB — CBC WITH DIFFERENTIAL/PLATELET
Abs Immature Granulocytes: 0.04 10*3/uL (ref 0.00–0.07)
Basophils Absolute: 0 10*3/uL (ref 0.0–0.1)
Basophils Relative: 0 %
Eosinophils Absolute: 0.1 10*3/uL (ref 0.0–0.5)
Eosinophils Relative: 1 %
HCT: 35.8 % — ABNORMAL LOW (ref 36.0–46.0)
Hemoglobin: 11.3 g/dL — ABNORMAL LOW (ref 12.0–15.0)
Immature Granulocytes: 1 %
Lymphocytes Relative: 11 %
Lymphs Abs: 1 10*3/uL (ref 0.7–4.0)
MCH: 28.3 pg (ref 26.0–34.0)
MCHC: 31.6 g/dL (ref 30.0–36.0)
MCV: 89.5 fL (ref 80.0–100.0)
Monocytes Absolute: 0.5 10*3/uL (ref 0.1–1.0)
Monocytes Relative: 5 %
Neutro Abs: 7 10*3/uL (ref 1.7–7.7)
Neutrophils Relative %: 82 %
Platelets: 119 10*3/uL — ABNORMAL LOW (ref 150–400)
RBC: 4 MIL/uL (ref 3.87–5.11)
RDW: 14.7 % (ref 11.5–15.5)
WBC: 8.6 10*3/uL (ref 4.0–10.5)
nRBC: 0 % (ref 0.0–0.2)

## 2020-04-16 LAB — IRON AND TIBC
Iron: 27 ug/dL — ABNORMAL LOW (ref 28–170)
Saturation Ratios: 9 % — ABNORMAL LOW (ref 10.4–31.8)
TIBC: 301 ug/dL (ref 250–450)
UIBC: 274 ug/dL

## 2020-04-16 LAB — FERRITIN: Ferritin: 87 ng/mL (ref 11–307)

## 2020-04-16 MED ORDER — SODIUM CHLORIDE 0.9 % IV SOLN
510.0000 mg | Freq: Once | INTRAVENOUS | Status: AC
  Administered 2020-04-16: 510 mg via INTRAVENOUS
  Filled 2020-04-16: qty 17

## 2020-04-16 MED ORDER — SODIUM CHLORIDE 0.9 % IV SOLN
INTRAVENOUS | Status: DC
  Filled 2020-04-16: qty 250

## 2020-04-16 NOTE — Progress Notes (Signed)
No s/s of distress or reaction noted, pt and VS stable at discharge.

## 2020-04-23 IMAGING — MG DIGITAL SCREENING BILATERAL MAMMOGRAM WITH TOMO AND CAD
8 series · 8 of 24 positions shown · non-contrast
Comparison: Previous exam(s).

CLINICAL DATA: Screening.

EXAM:
DIGITAL SCREENING BILATERAL MAMMOGRAM WITH TOMO AND CAD

[L MLO synth-2D]
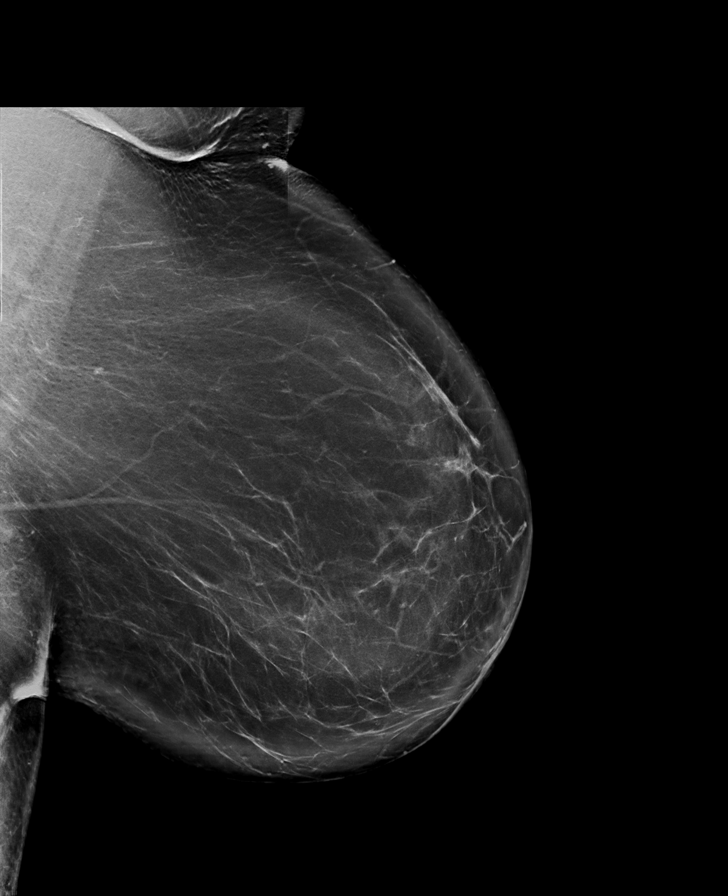

[R CC synth-2D]
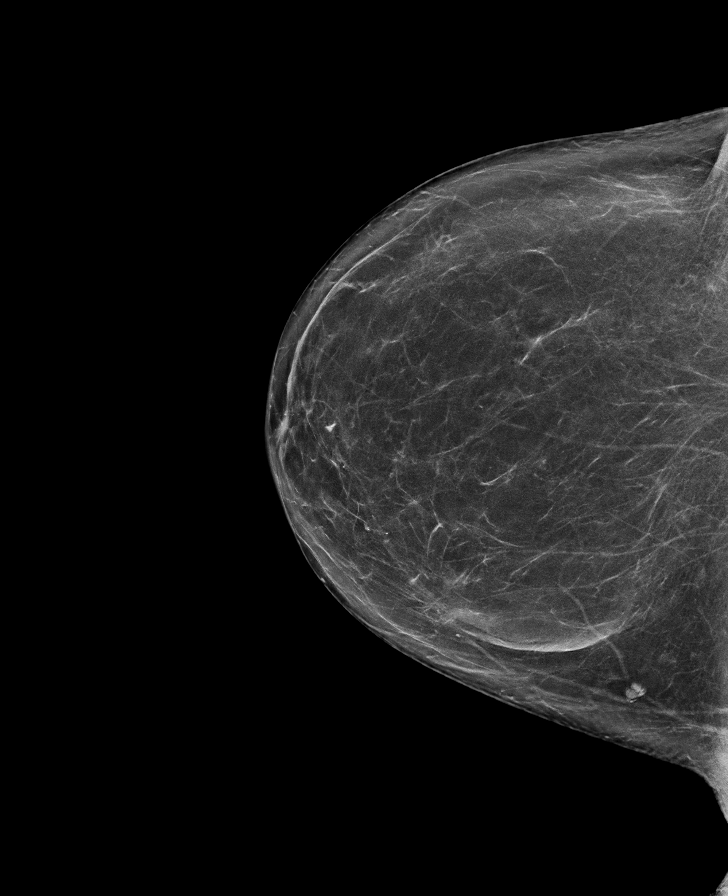

[L CC synth-2D]
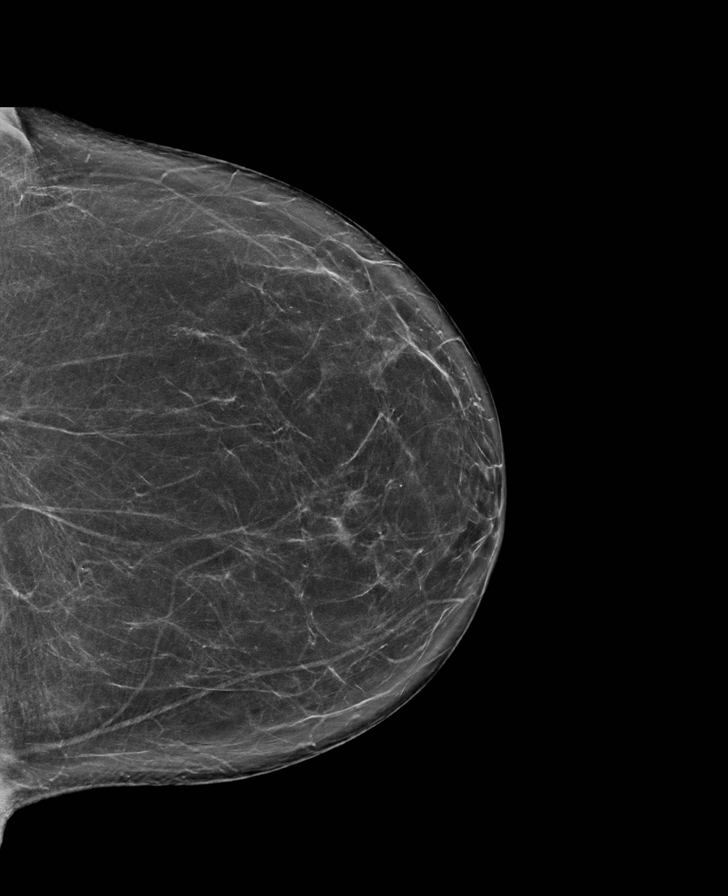

[R MLO synth-2D]
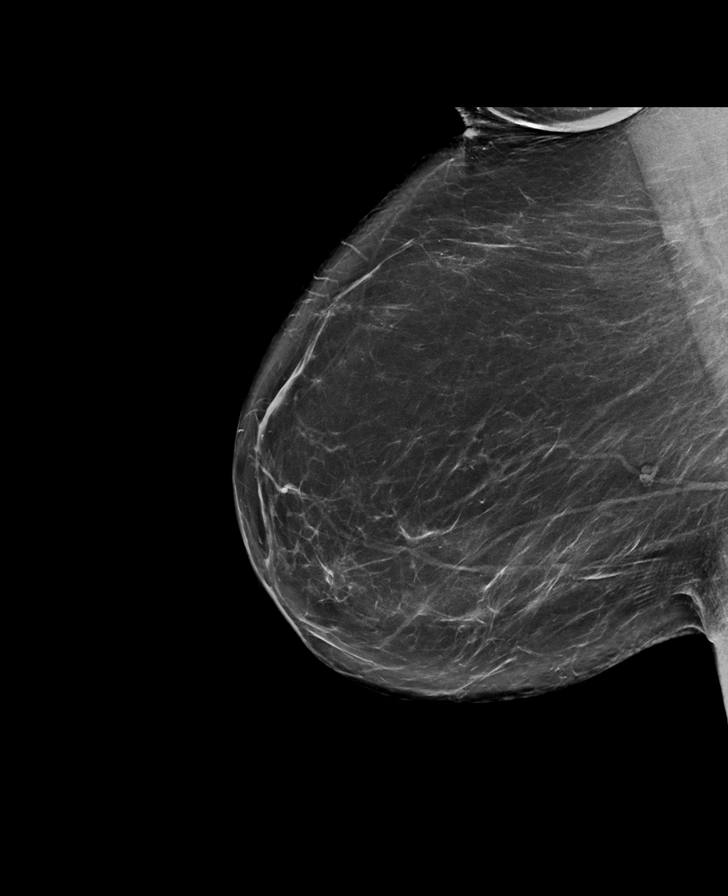

[R CC tomo · tomo slice 40/79.0]
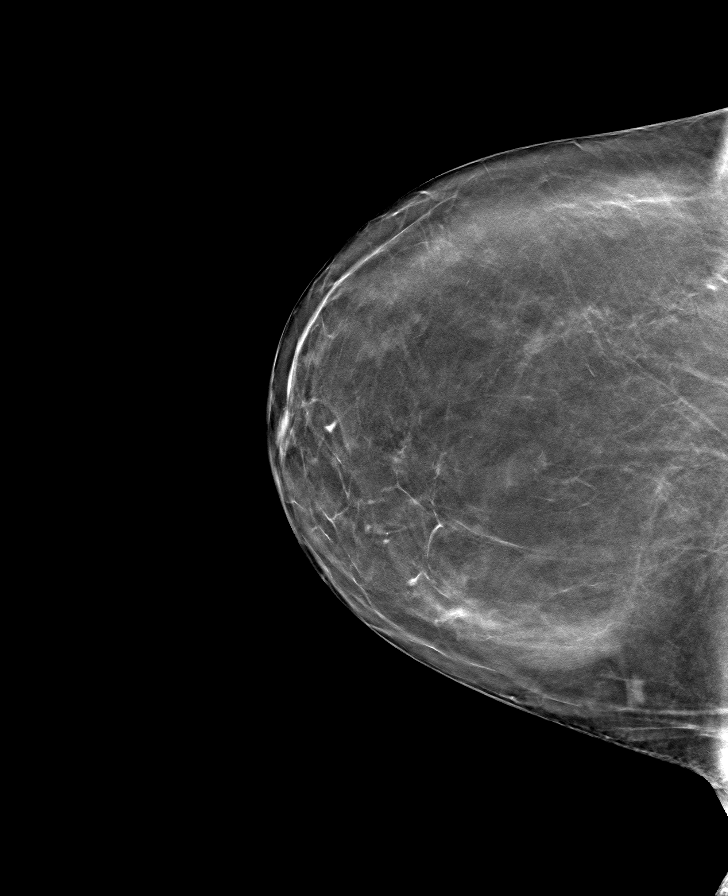

[L MLO tomo · tomo slice 50/99.0]
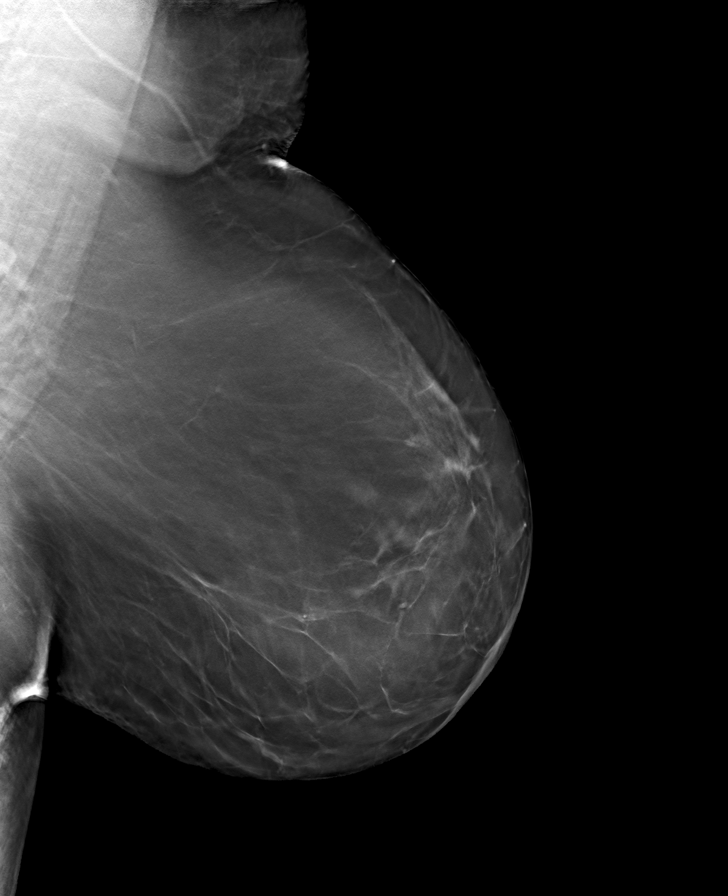

[L CC tomo · tomo slice 39/77.0]
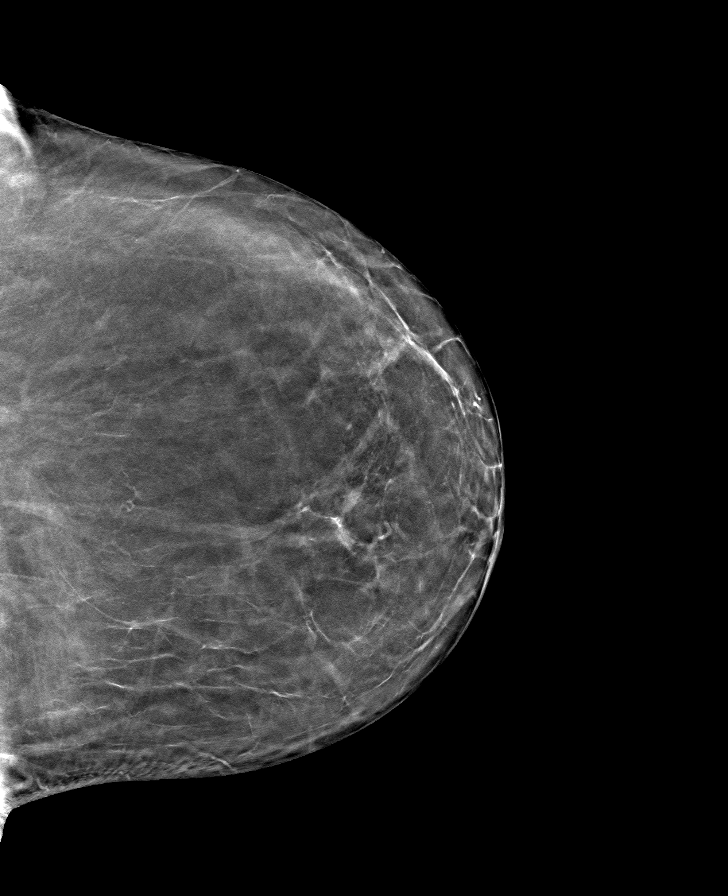

[R MLO tomo · tomo slice 45/90.0]
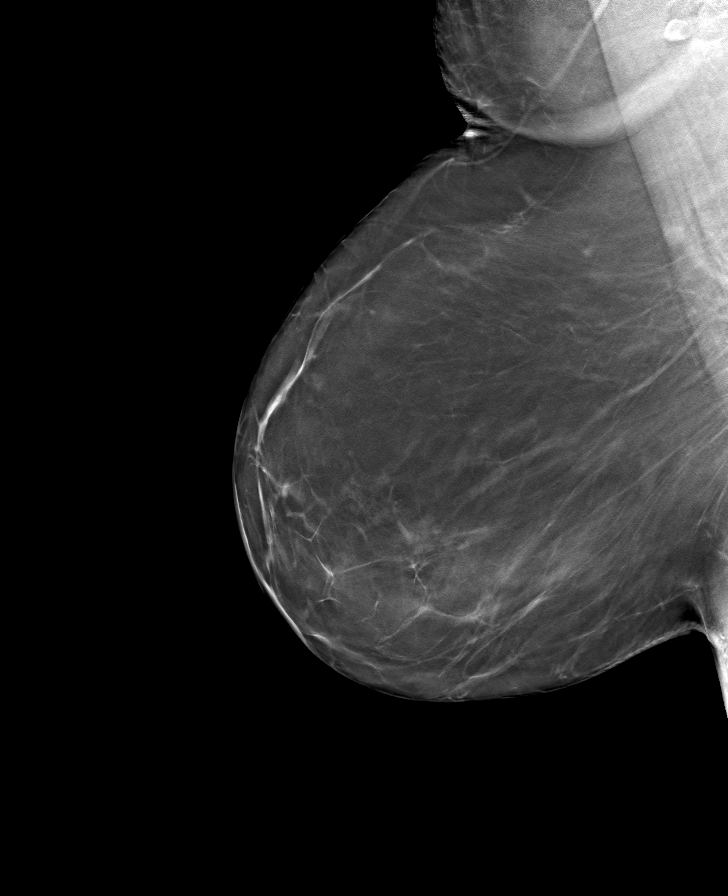

[8 of 24 positions shown; findings below may reference images not displayed]

ACR Breast Density Category b: There are scattered areas of
fibroglandular density.
FINDINGS: There are no findings suspicious for malignancy. Images were
processed with CAD.
IMPRESSION: No mammographic evidence of malignancy. A result letter of this
screening mammogram will be mailed directly to the patient.

RECOMMENDATION:
Screening mammogram in one year. (Code:[id number])

BI-RADS CATEGORY  1: Negative.

## 2020-04-29 DIAGNOSIS — Z23 Encounter for immunization: Secondary | ICD-10-CM | POA: Diagnosis not present

## 2020-04-30 ENCOUNTER — Ambulatory Visit (INDEPENDENT_AMBULATORY_CARE_PROVIDER_SITE_OTHER): Payer: Medicare Other

## 2020-04-30 VITALS — Ht 62.0 in | Wt 206.0 lb

## 2020-04-30 DIAGNOSIS — Z0000 Encounter for general adult medical examination without abnormal findings: Secondary | ICD-10-CM

## 2020-04-30 NOTE — Patient Instructions (Addendum)
Janet Rice , Thank you for taking time to come for your Medicare Wellness Visit. I appreciate your ongoing commitment to your health goals. Please review the following plan we discussed and let me know if I can assist you in the future.   These are the goals we discussed: Goals    . Low carb diet    . Maintain Healthy Lifestyle     Exercise as tolerated       This is a list of the screening recommended for you and due dates:  Health Maintenance  Topic Date Due  . Colon Cancer Screening  10/01/2017  . Mammogram  01/04/2021  . Tetanus Vaccine  03/24/2022  . Flu Shot  Completed  . DEXA scan (bone density measurement)  Completed  . COVID-19 Vaccine  Completed  .  Hepatitis C: One time screening is recommended by Center for Disease Control  (CDC) for  adults born from [birth year] through 1965.   Completed  . Pneumonia vaccines  Completed    Immunizations Immunization History  Administered Date(s) Administered  . Fluad Quad(high Dose 65+) 04/26/2019  . Influenza Split 08/17/2011, 05/12/2014  . Influenza, High Dose Seasonal PF 08/31/2016, 07/22/2017, 06/13/2018  . Influenza-Unspecified 05/16/2013, 04/11/2014, 04/27/2019  . PFIZER SARS-COV-2 Vaccination 08/03/2019, 08/25/2019  . Pneumococcal Conjugate-13 01/16/2014  . Pneumococcal Polysaccharide-23 03/24/2012, 08/22/2018  . Tdap 03/24/2012  . Zoster 08/18/2012   Keep all routine maintenance appointments.   Follow up 08/29/20  Advanced directives: End of life planning; Advance aging; Advanced directives discussed.  Copy of current HCPOA/Living Will requested.    Conditions/risks identified: none new.  Follow up in one year for your annual wellness visit.   Preventive Care 86 Years and Older, Female Preventive care refers to lifestyle choices and visits with your health care provider that can promote health and wellness. What does preventive care include?  A yearly physical exam. This is also called an annual well check.  Dental  exams once or twice a year.  Routine eye exams. Ask your health care provider how often you should have your eyes checked.  Personal lifestyle choices, including:  Daily care of your teeth and gums.  Regular physical activity.  Eating a healthy diet.  Avoiding tobacco and drug use.  Limiting alcohol use.  Practicing safe sex.  Taking low-dose aspirin every day.  Taking vitamin and mineral supplements as recommended by your health care provider. What happens during an annual well check? The services and screenings done by your health care provider during your annual well check will depend on your age, overall health, lifestyle risk factors, and family history of disease. Counseling  Your health care provider may ask you questions about your:  Alcohol use.  Tobacco use.  Drug use.  Emotional well-being.  Home and relationship well-being.  Sexual activity.  Eating habits.  History of falls.  Memory and ability to understand (cognition).  Work and work Statistician.  Reproductive health. Screening  You may have the following tests or measurements:  Height, weight, and BMI.  Blood pressure.  Lipid and cholesterol levels. These may be checked every 5 years, or more frequently if you are over 14 years old.  Skin check.  Lung cancer screening. You may have this screening every year starting at age 32 if you have a 30-pack-year history of smoking and currently smoke or have quit within the past 15 years.  Fecal occult blood test (FOBT) of the stool. You may have this test every year starting at age  50.  Flexible sigmoidoscopy or colonoscopy. You may have a sigmoidoscopy every 5 years or a colonoscopy every 10 years starting at age 73.  Hepatitis C blood test.  Hepatitis B blood test.  Sexually transmitted disease (STD) testing.  Diabetes screening. This is done by checking your blood sugar (glucose) after you have not eaten for a while (fasting). You may  have this done every 1-3 years.  Bone density scan. This is done to screen for osteoporosis. You may have this done starting at age 53.  Mammogram. This may be done every 1-2 years. Talk to your health care provider about how often you should have regular mammograms. Talk with your health care provider about your test results, treatment options, and if necessary, the need for more tests. Vaccines  Your health care provider may recommend certain vaccines, such as:  Influenza vaccine. This is recommended every year.  Tetanus, diphtheria, and acellular pertussis (Tdap, Td) vaccine. You may need a Td booster every 10 years.  Zoster vaccine. You may need this after age 40.  Pneumococcal 13-valent conjugate (PCV13) vaccine. One dose is recommended after age 89.  Pneumococcal polysaccharide (PPSV23) vaccine. One dose is recommended after age 30. Talk to your health care provider about which screenings and vaccines you need and how often you need them. This information is not intended to replace advice given to you by your health care provider. Make sure you discuss any questions you have with your health care provider. Document Released: 07/25/2015 Document Revised: 03/17/2016 Document Reviewed: 04/29/2015 Elsevier Interactive Patient Education  2017 [place] Prevention in the Home Falls can cause injuries. They can happen to people of all ages. There are many things you can do to make your home safe and to help prevent falls. What can I do on the outside of my home?  Regularly fix the edges of walkways and driveways and fix any cracks.  Remove anything that might make you trip as you walk through a door, such as a raised step or threshold.  Trim any bushes or trees on the path to your home.  Use bright outdoor lighting.  Clear any walking paths of anything that might make someone trip, such as rocks or tools.  Regularly check to see if handrails are loose or broken. Make  sure that both sides of any steps have handrails.  Any raised decks and porches should have guardrails on the edges.  Have any leaves, snow, or ice cleared regularly.  Use sand or salt on walking paths during winter.  Clean up any spills in your garage right away. This includes oil or grease spills. What can I do in the bathroom?  Use night lights.  Install grab bars by the toilet and in the tub and shower. Do not use towel bars as grab bars.  Use non-skid mats or decals in the tub or shower.  If you need to sit down in the shower, use a plastic, non-slip stool.  Keep the floor dry. Clean up any water that spills on the floor as soon as it happens.  Remove soap buildup in the tub or shower regularly.  Attach bath mats securely with double-sided non-slip rug tape.  Do not have throw rugs and other things on the floor that can make you trip. What can I do in the bedroom?  Use night lights.  Make sure that you have a light by your bed that is easy to reach.  Do not use any sheets  or blankets that are too big for your bed. They should not hang down onto the floor.  Have a firm chair that has side arms. You can use this for support while you get dressed.  Do not have throw rugs and other things on the floor that can make you trip. What can I do in the kitchen?  Clean up any spills right away.  Avoid walking on wet floors.  Keep items that you use a lot in easy-to-reach places.  If you need to reach something above you, use a strong step stool that has a grab bar.  Keep electrical cords out of the way.  Do not use floor polish or wax that makes floors slippery. If you must use wax, use non-skid floor wax.  Do not have throw rugs and other things on the floor that can make you trip. What can I do with my stairs?  Do not leave any items on the stairs.  Make sure that there are handrails on both sides of the stairs and use them. Fix handrails that are broken or loose.  Make sure that handrails are as long as the stairways.  Check any carpeting to make sure that it is firmly attached to the stairs. Fix any carpet that is loose or worn.  Avoid having throw rugs at the top or bottom of the stairs. If you do have throw rugs, attach them to the floor with carpet tape.  Make sure that you have a light switch at the top of the stairs and the bottom of the stairs. If you do not have them, ask someone to add them for you. What else can I do to help prevent falls?  Wear shoes that:  Do not have high heels.  Have rubber bottoms.  Are comfortable and fit you well.  Are closed at the toe. Do not wear sandals.  If you use a stepladder:  Make sure that it is fully opened. Do not climb a closed stepladder.  Make sure that both sides of the stepladder are locked into place.  Ask someone to hold it for you, if possible.  Clearly mark and make sure that you can see:  Any grab bars or handrails.  First and last steps.  Where the edge of each step is.  Use tools that help you move around (mobility aids) if they are needed. These include:  Canes.  Walkers.  Scooters.  Crutches.  Turn on the lights when you go into a dark area. Replace any light bulbs as soon as they burn out.  Set up your furniture so you have a clear path. Avoid moving your furniture around.  If any of your floors are uneven, fix them.  If there are any pets around you, be aware of where they are.  Review your medicines with your doctor. Some medicines can make you feel dizzy. This can increase your chance of falling. Ask your doctor what other things that you can do to help prevent falls. This information is not intended to replace advice given to you by your health care provider. Make sure you discuss any questions you have with your health care provider. Document Released: 04/24/2009 Document Revised: 12/04/2015 Document Reviewed: 08/02/2014 Elsevier Interactive Patient  Education  2017 [employer].

## 2020-04-30 NOTE — Progress Notes (Signed)
Subjective:   Janet Rice is a 74 y.o. female who presents for Medicare Annual (Subsequent) preventive examination.  Review of Systems    No ROS.  Medicare Wellness Virtual Visit.   Cardiac Risk Factors include: advanced age (>19mn, >>40women)     Objective:    Today's Vitals   04/30/20 1131  Weight: 206 lb (93.4 kg)  Height: 5' 2"  (1.575 m)   Body mass index is 37.68 kg/m.  Advanced Directives 04/30/2020 04/15/2020 03/11/2020 05/11/2019 01/05/2019 12/30/2018 09/19/2018  Does Patient Have a Medical Advance Directive? Yes Yes Yes Yes Yes Yes Yes  Type of AParamedicof AWykoffLiving will H[place]leyLiving will HAdamstownLiving will HKalaheoLiving will HHaydenLiving will HCentervilleLiving will -  Does patient want to make changes to medical advance directive? No - Patient declined No - Patient declined No - Patient declined No - Patient declined No - Patient declined No - Patient declined -  Copy of HJune Lakein Chart? No - copy requested No - copy requested No - copy requested No - copy requested No - copy requested No - copy requested -    Current Medications (verified) Outpatient Encounter Medications as of 04/30/2020  Medication Sig  . acetaminophen (TYLENOL 8 HOUR ARTHRITIS PAIN) 650 MG CR tablet Take 1,300 mg by mouth daily.   . Calcium Carbonate-Vitamin D 600-400 MG-UNIT tablet Take by mouth.  . celecoxib (CELEBREX) 200 MG capsule   . citalopram (CELEXA) 20 MG tablet Take 1 tablet (20 mg total) by mouth daily.  . Coenzyme Q10 50 MG CAPS Take by mouth.  . Ferrous Sulfate Dried (SLOW RELEASE IRON) 45 MG TBCR Take 45 mg by mouth daily with lunch.  . Iron-Vitamins (GERITOL) LIQD Take by mouth.  . levothyroxine (SYNTHROID) 125 MCG tablet Take 1 tablet (125 mcg total) by mouth daily before breakfast. Skip sundays  . loperamide  (IMODIUM) 2 MG capsule Take 2 mg by mouth every morning.  . Multiple Vitamins-Minerals (EYE VITAMINS & MINERALS) TABS Take 1 tablet by mouth daily.   . NON FORMULARY Apply 1 application topically 2 (two) times daily as needed (muscle pain). Horse Liniment  . pravastatin (PRAVACHOL) 40 MG tablet Take 0.5 tablets (20 mg total) by mouth daily.  .Marland KitchenPropylene Glycol (SYSTANE BALANCE OP) Place 1 drop into both eyes daily as needed (dry eyes).  . Sodium Sulfate-Mag Sulfate-KCl (SUTAB) 13511566955MG TABS Take 1 kit by mouth as directed.   No facility-administered encounter medications on file as of 04/30/2020.    Allergies (verified) Sulfa antibiotics   History: Past Medical History:  Diagnosis Date  . Adenomatous colon polyp   . Arthritis   . Childhood asthma    AS A CHILD  . Chronic heartburn    On omeprazole  . Complication of anesthesia    HARD TO WAKE UP  . Dyspnea    low hgb. and iron  chronic problem  . External hemorrhoids   . Frequency of urination   . Frequent loose stools   . GERD (gastroesophageal reflux disease)   . Heme positive stool   . History of hiatal hernia   . Hyperlipidemia   . Hypothyroidism   . Iron deficiency anemia   . Melanoma ([place]    left thigh s/p LN removal left groin  . Metastatic melanoma ([place]    Followed by Dr COliva Bustard previous chemo, no recurrence, 2008?  .Marland Kitchen  Multinodular goiter   . SCC (squamous cell carcinoma)    skin   Past Surgical History:  Procedure Laterality Date  . ABDOMINAL HYSTERECTOMY  1984  . BACK SURGERY  03/2016   LUMBAR  . CARPAL TUNNEL RELEASE Right 09/07/2016   Procedure: CARPAL TUNNEL RELEASE;  Surgeon: Thornton Park, MD;  Location: [facility] ORS;  Service: Orthopedics;  Laterality: Right;  . CHOLECYSTECTOMY  1990  . COLONOSCOPY  08/2006   Four sessile polyps found and removed in sigmoid colon at splenic flexure and ascending colon. 7 mm in size. Another removed from transverse colon 4 mm. Path Report - showed tubular adenoma  and hyperplastic polyp. Advised to repeat in 3.5 years (02/2010)  . COLONOSCOPY  3.2.2011   8 mm polyp in sigmoid colon, removed, 4 mm polyp in descending colon, removed. Internal hemorrhoids  . ESOPHAGOGASTRODUODENOSCOPY  3.2.2011   Large hiatia hernia, esophagus normal, mulitple small sessile polyps w/no stigmata of recent bleeding found. Mildy erythermatous mucosa w/no bleeding found in gatric antrum. Normal duodenum. 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 PATH - no active inflammation, antral mucosa w/mild foveolar hyperplasia  . FRACTURE SURGERY    . Lymph Node Removal  12/2005   Left inguinal lymph node dissection  . MELANOMA EXCISION Left 1993   Left thigh  . ORIF ANKLE FRACTURE Right    Dr. Mack Guise  . TOTAL HIP ARTHROPLASTY Right 11/18/2016   Procedure: TOTAL HIP ARTHROPLASTY;  Surgeon: Thornton Park, MD;  Location: [facility] ORS;  Service: Orthopedics;  Laterality: Right;   Family History  Problem Relation Age of Onset  . Aneurysm Mother   . Heart disease Mother   . Colon polyps Father   . Diabetes Father   . Dementia Father   . Stroke Father        TIA  . Leukemia Maternal Grandfather   . Skin cancer Paternal Grandfather   . Diabetes Other   . Colon polyps Other   . Colon cancer Neg Hx   . Breast cancer Neg Hx    Social History   Socioeconomic History  . Marital status: Married    Spouse name: Not on file  . Number of children: 2  . Years of education: Not on file  . Highest education level: Not on file  Occupational History  . Occupation: HR Consultant    Employer: [employer]  Tobacco Use  . Smoking status: Former Smoker    Packs/day: 0.50    Years: 8.00    Pack years: 4.00    Types: Cigarettes    Quit date: 07/12/1978    Years since quitting: 41.8  . Smokeless tobacco: Never Used  Vaping Use  . Vaping Use: Never used  Substance and Sexual Activity  . Alcohol use: No    Alcohol/week: 0.0 standard drinks  . Drug use: No  . Sexual activity: Not  Currently  Other Topics Concern  . Not on file  Social History Narrative   Part time [employer] as of 03/10/20 will be retiring   Married    Social Determinants of Radio broadcast assistant Strain: Low Risk   . Difficulty of Paying Living Expenses: Not hard at all  Food Insecurity: No Food Insecurity  . Worried About Charity fundraiser in the Last Year: Never true  . Ran Out of Food in the Last Year: Never true  Transportation Needs: No Transportation Needs  . Lack of Transportation (Medical): No  . Lack of Transportation (Non-Medical): No  Physical  Activity:   . Days of Exercise per Week: Not on file  . Minutes of Exercise per Session: Not on file  Stress: No Stress Concern Present  . Feeling of Stress : Not at all  Social Connections: Unknown  . Frequency of Communication with Friends and Family: Not on file  . Frequency of Social Gatherings with Friends and Family: Not on file  . Attends Religious Services: Not on file  . Active Member of Clubs or Organizations: Not on file  . Attends Archivist Meetings: Not on file  . Marital Status: Married    Tobacco Counseling Counseling given: Not Answered   Clinical Intake:  Pre-visit preparation completed: Yes        Diabetes: No  How often do you need to have someone help you when you read instructions, pamphlets, or other written materials from your doctor or pharmacy?: 1 - Never   Interpreter Needed?: No      Activities of Daily Living In your present state of health, do you have any difficulty performing the following activities: 04/30/2020  Hearing? N  Vision? N  Difficulty concentrating or making decisions? N  Walking or climbing stairs? Y  Comment Chronic hip pain.  Dressing or bathing? N  Doing errands, shopping? N  Preparing Food and eating ? N  Using the Toilet? N  In the past six months, have you accidently leaked urine? N  Do you have problems with loss of bowel control? N  Managing  your Medications? N  Managing your Finances? N  Housekeeping or managing your Housekeeping? N  Some recent data might be hidden    Patient Care Team: McLean-Scocuzza, Nino Glow, MD as PCP - General (Internal Medicine) Lloyd Huger, MD as Consulting Physician (Hematology and Oncology)  Indicate any recent Medical Services you may have received from other than [facility] providers in the past year (date may be approximate).     Assessment:   This is a routine wellness examination for [place].  I connected with Ramina today by telephone and verified that I am speaking with the correct person using two identifiers. Location patient: home Location provider: work Persons participating in the virtual visit: patient, Marine scientist.    I discussed the limitations, risks, security and privacy concerns of performing an evaluation and management service by telephone and the availability of in person appointments. The patient expressed understanding and verbally consented to this telephonic visit.    Interactive audio and video telecommunications were attempted between this provider and patient, however failed, due to patient having technical difficulties OR patient did not have access to video capability.  We continued and completed visit with audio only.  Some vital signs may be absent or patient reported.   Hearing/Vision screen  Hearing Screening   '125Hz'$  $Remo'250Hz'uEWmK$'500Hz'$'1000Hz'$'2000Hz'$'3000Hz'$'4000Hz'$'6000Hz'$'8000Hz'$   Right ear:           Left ear:           Comments: Followed by [facility]  Difficulty hearing soft tones  She does not wear hearing aids  Vision Screening Comments: Wears corrective lenses  Cataract extraction, bilateral  Visual acuity not assessed per patient preference since they have regular follow up with the ophthalmologist  Dietary issues and exercise activities discussed: Current Exercise Habits: Home exercise routine, Intensity: Mild  Goals    . Low carb diet    .  Maintain Healthy Lifestyle     Exercise as tolerated  Depression Screen PHQ 2/9 Scores 04/30/2020 02/27/2020 12/13/2018 08/22/2018 12/30/2017 12/11/2015 10/15/2015  PHQ - 2 Score 0 0 0 0 0 0 0  PHQ- 9 Score - 0 - - - - -    Fall Risk Fall Risk  04/30/2020 02/27/2020 12/13/2018 08/22/2018 06/13/2018  Falls in the past year? 0 0 0 0 0  Number falls in past yr: 0 0 - - -  Injury with Fall? - 0 - - -  Risk for fall due to : - Impaired balance/gait - - -  Follow up Falls evaluation completed Falls evaluation completed - - -   Handrails in use when climbing? Yes Home free of loose throw rugs in walkways, pet beds, electrical cords, etc? Yes  Adequate lighting in your home to reduce risk of falls? Yes   ASSISTIVE DEVICES UTILIZED TO PREVENT FALLS: Use of a cane, walker or w/c? Yes   TIMED UP AND GO: Was the test performed? No . Virtual visit.   Cognitive Function: Patient is alert and oriented x3.  Denies difficulty making decisions, memory loss, focusing.  Enjoys working part time on the computer and reading.  MMSE - Mini Mental State Exam 12/30/2017 10/15/2015  Orientation to time 5 5  Orientation to Place 5 5  Registration 3 3  Attention/ Calculation 5 5  Recall 3 3  Language- name 2 objects 2 2  Language- repeat 1 1  Language- follow 3 step command 3 3  Language- read & follow direction 1 1  Write a sentence 1 1  Copy design 1 1  Total score 30 30       Immunizations Immunization History  Administered Date(s) Administered  . Fluad Quad(high Dose 65+) 04/26/2019, 04/29/2020  . Influenza Split 08/17/2011, 05/12/2014  . Influenza, High Dose Seasonal PF 08/31/2016, 07/22/2017, 06/13/2018  . Influenza-Unspecified 05/16/2013, 04/11/2014, 04/27/2019  . PFIZER SARS-COV-2 Vaccination 08/03/2019, 08/25/2019  . Pneumococcal Conjugate-13 01/16/2014  . Pneumococcal Polysaccharide-23 03/24/2012, 08/22/2018  . Tdap 03/24/2012  . Zoster 08/18/2012   Health Maintenance Health  Maintenance  Topic Date Due  . COLONOSCOPY  10/01/2017  . MAMMOGRAM  01/04/2021  . TETANUS/TDAP  03/24/2022  . INFLUENZA VACCINE  Completed  . DEXA SCAN  Completed  . COVID-19 Vaccine  Completed  . Hepatitis C Screening  Completed  . PNA vac Low Risk Adult  Completed   Dental Screening: Recommended annual dental exams for proper oral hygiene.   Colonoscopy- scheduled.   Community Resource Referral / Chronic Care Management: CRR required this visit?  No   CCM required this visit?  No      Plan:   Keep all routine maintenance appointments.   Follow up 08/29/20  I have personally reviewed and noted the following in the patient's chart:   . Medical and social history . Use of alcohol, tobacco or illicit drugs  . Current medications and supplements . Functional ability and status . Nutritional status . Physical activity . Advanced directives . List of other physicians . Hospitalizations, surgeries, and ER visits in previous 12 months . Vitals . Screenings to include cognitive, depression, and falls . Referrals and appointments  In addition, I have reviewed and discussed with patient certain preventive protocols, quality metrics, and best practice recommendations. A written personalized care plan for preventive services as well as general preventive health recommendations were provided to patient via mychart.     Varney Biles, LPN   16/04/[id number]

## 2020-05-02 ENCOUNTER — Other Ambulatory Visit: Payer: Self-pay

## 2020-05-02 ENCOUNTER — Encounter: Payer: Self-pay | Admitting: Internal Medicine

## 2020-05-02 ENCOUNTER — Ambulatory Visit (AMBULATORY_SURGERY_CENTER): Payer: Medicare Other | Admitting: Internal Medicine

## 2020-05-02 VITALS — BP 133/76 | HR 61 | Temp 97.8°F | Resp 10 | Ht 62.0 in | Wt 201.0 lb

## 2020-05-02 DIAGNOSIS — D500 Iron deficiency anemia secondary to blood loss (chronic): Secondary | ICD-10-CM | POA: Diagnosis not present

## 2020-05-02 DIAGNOSIS — K529 Noninfective gastroenteritis and colitis, unspecified: Secondary | ICD-10-CM

## 2020-05-02 DIAGNOSIS — K2970 Gastritis, unspecified, without bleeding: Secondary | ICD-10-CM

## 2020-05-02 DIAGNOSIS — Z86010 Personal history of colonic polyps: Secondary | ICD-10-CM

## 2020-05-02 DIAGNOSIS — K449 Diaphragmatic hernia without obstruction or gangrene: Secondary | ICD-10-CM | POA: Diagnosis not present

## 2020-05-02 DIAGNOSIS — D649 Anemia, unspecified: Secondary | ICD-10-CM

## 2020-05-02 DIAGNOSIS — K259 Gastric ulcer, unspecified as acute or chronic, without hemorrhage or perforation: Secondary | ICD-10-CM | POA: Diagnosis not present

## 2020-05-02 MED ORDER — SODIUM CHLORIDE 0.9 % IV SOLN: 500.0000 mL | Freq: Once | INTRAVENOUS | Status: DC

## 2020-05-02 NOTE — Patient Instructions (Signed)
Discharge instructions given. Handouts on Diverticulosis,Gastritis, and Hiatal Hernia. Resume previous medications. YOU HAD AN ENDOSCOPIC PROCEDURE TODAY AT [facility]:   Refer to the procedure report that was given to you for any specific questions about what was found during the examination.  If the procedure report does not answer your questions, please call your gastroenterologist to clarify.  If you requested that your care partner not be given the details of your procedure findings, then the procedure report has been included in a sealed envelope for you to review at your convenience later.  YOU SHOULD EXPECT: Some feelings of bloating in the abdomen. Passage of more gas than usual.  Walking can help get rid of the air that was put into your GI tract during the procedure and reduce the bloating. If you had a lower endoscopy (such as a colonoscopy or flexible sigmoidoscopy) you may notice spotting of blood in your stool or on the toilet paper. If you underwent a bowel prep for your procedure, you may not have a normal bowel movement for a few days.  Please Note:  You might notice some irritation and congestion in your nose or some drainage.  This is from the oxygen used during your procedure.  There is no need for concern and it should clear up in a day or so.  SYMPTOMS TO REPORT IMMEDIATELY:   Following lower endoscopy (colonoscopy or flexible sigmoidoscopy):  Excessive amounts of blood in the stool  Significant tenderness or worsening of abdominal pains  Swelling of the abdomen that is new, acute  Fever of 100F or higher   Following upper endoscopy (EGD)  Vomiting of blood or coffee ground material  New chest pain or pain under the shoulder blades  Painful or persistently difficult swallowing  New shortness of breath  Fever of 100F or higher  Black, tarry-looking stools  For urgent or emergent issues, a gastroenterologist can be reached at any hour by calling  [phone]. Do not use MyChart messaging for urgent concerns.    DIET:  We do recommend a small meal at first, but then you may proceed to your regular diet.  Drink plenty of fluids but you should avoid alcoholic beverages for 24 hours.  ACTIVITY:  You should plan to take it easy for the rest of today and you should NOT DRIVE or use heavy machinery until tomorrow (because of the sedation medicines used during the test).    FOLLOW UP: Our staff will call the number listed on your records 48-72 hours following your procedure to check on you and address any questions or concerns that you may have regarding the information given to you following your procedure. If we do not reach you, we will leave a message.  We will attempt to reach you two times.  During this call, we will ask if you have developed any symptoms of COVID 19. If you develop any symptoms (ie: fever, flu-like symptoms, shortness of breath, cough etc.) before then, please call [phone].  If you test positive for Covid 19 in the 2 weeks post procedure, please call and report this information to [place].    If any biopsies were taken you will be contacted by phone or by letter within the next 1-3 weeks.  Please call us at [phone] if you have not heard about the biopsies in 3 weeks.    SIGNATURES/CONFIDENTIALITY: You and/or your care partner have signed paperwork which will be entered into your electronic medical record.  These signatures attest to the fact that that the information above on your After Visit Summary has been reviewed and is understood.  Full responsibility of the confidentiality of this discharge information lies with you and/or your care-partner.

## 2020-05-02 NOTE — Progress Notes (Signed)
Called to room to assist during endoscopic procedure.  Patient ID and intended procedure confirmed with present staff. Received instructions for my participation in the procedure from the performing physician.  

## 2020-05-02 NOTE — Op Note (Signed)
[facility] Patient Name: Janet Rice Procedure Date: 05/02/2020 2:27 PM MRN: [id number] Endoscopist: Jerene Bears , MD Age: 74 Referring MD:  Date of Birth: [birth year] Gender: Female Account #: [id number] Procedure:                Upper GI endoscopy Indications:              Iron deficiency anemia with history of large hiatal                            hernia, chronic diarrhea Medicines:                Monitored Anesthesia Care Procedure:                Pre-Anesthesia Assessment:                           - Prior to the procedure, a History and Physical                            was performed, and patient medications and                            allergies were reviewed. The patient's tolerance of                            previous anesthesia was also reviewed. The risks                            and benefits of the procedure and the sedation                            options and risks were discussed with the patient.                            All questions were answered, and informed consent                            was obtained. Prior Anticoagulants: The patient has                            taken no previous anticoagulant or antiplatelet                            agents. ASA Grade Assessment: II - A patient with                            mild systemic disease. After reviewing the risks                            and benefits, the patient was deemed in                            satisfactory condition to undergo the procedure.  After obtaining informed consent, the endoscope was                            passed under direct vision. Throughout the                            procedure, the patient's blood pressure, pulse, and                            oxygen saturations were monitored continuously. The                            Endoscope was introduced through the mouth, and                            advanced to the second part  of duodenum. The upper                            GI endoscopy was accomplished without difficulty.                            The patient tolerated the procedure well. Scope In: Scope Out: Findings:                 In the proximal esophagus there was a small inlet                            patch without nodularity or visible abnormality.                           The exam of the esophagus was otherwise normal.                           An 8 cm hiatal hernia was found. The proximal                            extent of the gastric folds (end of tubular                            esophagus) was 32 cm from the incisors. The hiatal                            narrowing was 40 cm from the incisors. The Z-line                            was 32 cm from the incisors. Cameron's erosions                            were present in the gastric fundus/body at the                            diaphragmatic hiatus.  Mild inflammation characterized by congestion                            (edema) and erythema was found in the gastric body                            and in the gastric antrum. Biopsies were taken with                            a cold forceps for histology and Helicobacter                            pylori testing.                           The examined duodenum was normal. Complications:            No immediate complications. Estimated Blood Loss:     Estimated blood loss was minimal. Impression:               - 8 cm hiatal hernia with Cameron's erosions.                            Likely source of iron deficiency anemia.                           - Gastritis. Biopsied.                           - Normal examined duodenum. Recommendation:           - Patient has a contact number available for                            emergencies. The signs and symptoms of potential                            delayed complications were discussed with the                             patient. Return to normal activities tomorrow.                            Written discharge instructions were provided to the                            patient.                           - Resume previous diet.                           - Continue present medications including daily iron                            supplementation                           -  Await pathology results. Jerene Bears, MD 05/02/2020 2:59:35 PM This report has been signed electronically.

## 2020-05-02 NOTE — Op Note (Addendum)
[place] Patient Name: Janet Rice Procedure Date: 05/02/2020 2:26 PM MRN: [id number] Endoscopist: Jerene Bears , MD Age: 74 Referring MD:  Date of Birth: 1945/11/19 Gender: Female Account #: [id number] Procedure:                Colonoscopy Indications:              High risk colon cancer surveillance: Personal                            history of colonic polyps (adenomas and sessile                            serrated polyps), Last colonoscopy: 2016 Medicines:                Monitored Anesthesia Care Procedure:                Pre-Anesthesia Assessment:                           - Prior to the procedure, a History and Physical                            was performed, and patient medications and                            allergies were reviewed. The patient's tolerance of                            previous anesthesia was also reviewed. The risks                            and benefits of the procedure and the sedation                            options and risks were discussed with the patient.                            All questions were answered, and informed consent                            was obtained. Prior Anticoagulants: The patient has                            taken no previous anticoagulant or antiplatelet                            agents. ASA Grade Assessment: II - A patient with                            mild systemic disease. After reviewing the risks                            and benefits, the patient was deemed in  satisfactory condition to undergo the procedure.                           After obtaining informed consent, the colonoscope                            was passed under direct vision. Throughout the                            procedure, the patient's blood pressure, pulse, and                            oxygen saturations were monitored continuously. The                            Colonoscope was  introduced through the anus and                            advanced to the cecum, identified by appendiceal                            orifice and ileocecal valve. The colonoscopy was                            performed without difficulty. The patient tolerated                            the procedure well. The quality of the bowel                            preparation was good. The ileocecal valve,                            appendiceal orifice, and rectum were photographed. Scope In: 2:42:15 PM Scope Out: 2:53:44 PM Scope Withdrawal Time: 0 hours 8 minutes 58 seconds  Total Procedure Duration: 0 hours 11 minutes 29 seconds  Findings:                 The digital rectal exam was normal.                           A tattoo was seen in the sigmoid colon. There was                            no evidence of residual polyp tissue.                           Multiple small-mouthed diverticula were found in                            the sigmoid colon.                           The exam was otherwise without abnormality.  The retroflexed view of the distal rectum and anal                            verge was normal and showed no anal or rectal                            abnormalities.                           Biopsies for histology were taken with a cold                            forceps from the right colon and left colon for                            evaluation of microscopic colitis. Complications:            No immediate complications. Estimated Blood Loss:     Estimated blood loss: none. Impression:               - A tattoo was seen in the sigmoid colon. There was                            no evidence of residual polyp tissue.                           - Diverticulosis in the sigmoid colon.                           - The examination was otherwise normal.                           - The distal rectum and anal verge are normal on                             retroflexion view.                           - Biopsies were taken with a cold forceps from the                            right colon and left colon for evaluation of                            microscopic colitis. Recommendation:           - Patient has a contact number available for                            emergencies. The signs and symptoms of potential                            delayed complications were discussed with the  patient. Return to normal activities tomorrow.                            Written discharge instructions were provided to the                            patient.                           - Resume previous diet.                           - Continue present medications.                           - No repeat colonoscopy due to age at next                            surveillance interval and the absence of colonic                            polyps on today's exam. Jerene Bears, MD 05/02/2020 3:03:36 PM This report has been signed electronically.

## 2020-05-02 NOTE — Progress Notes (Signed)
Report to PACU, RN, vss, BBS= Clear.  

## 2020-05-06 ENCOUNTER — Telehealth: Payer: Self-pay | Admitting: Internal Medicine

## 2020-05-06 ENCOUNTER — Telehealth: Payer: Self-pay

## 2020-05-06 DIAGNOSIS — Z01812 Encounter for preprocedural laboratory examination: Secondary | ICD-10-CM | POA: Diagnosis not present

## 2020-05-06 DIAGNOSIS — D7289 Other specified disorders of white blood cells: Secondary | ICD-10-CM | POA: Diagnosis not present

## 2020-05-06 DIAGNOSIS — R718 Other abnormality of red blood cells: Secondary | ICD-10-CM | POA: Diagnosis not present

## 2020-05-06 NOTE — Telephone Encounter (Signed)
  Follow up Call-  Call back number 05/02/2020  Post procedure Call Back phone  # [phone]  Permission to leave phone message Yes  Some recent data might be hidden     Patient questions:  Do you have a fever, pain , or abdominal swelling? No. Pain Score  0 *  Have you tolerated food without any problems? Yes.    Have you been able to return to your normal activities? Yes.    Do you have any questions about your discharge instructions: Diet   No. Medications  No. Follow up visit  No.  Do you have questions or concerns about your Care? No.  Actions: * If pain score is 4 or above: No action needed, pain <4.  1. Have you developed a fever since your procedure? no  2.   Have you had an respiratory symptoms (SOB or cough) since your procedure? no  3.   Have you tested positive for COVID 19 since your procedure no  4.   Have you had any family members/close contacts diagnosed with the COVID 19 since your procedure?  no   If yes to any of these questions please route to Joylene John, RN and Joella Prince, RN

## 2020-05-06 NOTE — Telephone Encounter (Signed)
Spoke with Mendel Ryder and gave her Dr. Vena Rua cell phone number to call regarding clearance as he is in the [facility] today.

## 2020-05-06 NOTE — Telephone Encounter (Signed)
Mendel Ryder from Ortho is requesting a call back from a nurse to discuss a procedure the pt has scheduled for left hip replacement.  [phone]

## 2020-05-06 NOTE — Telephone Encounter (Signed)
I called [facility], Dr. Harlow Mares and Mendel Ryder were not available I did speak with Melissa one of their CMAs It appears Dr. Harlow Mares did not need clearance but rather wanted her to reconnect with me/GI given her IDA She has had recent EGD/colon and it is likely her large HH is the causing of low iron and intermittent anemia. That said, her Hgb is as good as it has been in a while at 11.3 two weeks ago I do not see a barrier for hip replacement tomorrow and I communicated that by phone to [facility] as above

## 2020-05-06 NOTE — Telephone Encounter (Signed)
Janet Rice, just wanted to make you aware that we received clearance from Ortho office and it was sent to Dr. Hilarie Fredrickson. [name] Comment from Ortho office called inquiring if clearance could be ready today as pt is scheduled for surgery tomorrow. I told her that I could not confirm that because Dr. Hilarie Fredrickson was doing procedures all day today. She stated that she was going to call pt to r/s her surgery, just wanted to make [place] aware in case pt calls [place].

## 2020-05-07 DIAGNOSIS — I10 Essential (primary) hypertension: Secondary | ICD-10-CM | POA: Diagnosis not present

## 2020-05-07 DIAGNOSIS — F32A Depression, unspecified: Secondary | ICD-10-CM | POA: Diagnosis not present

## 2020-05-07 DIAGNOSIS — E669 Obesity, unspecified: Secondary | ICD-10-CM | POA: Diagnosis not present

## 2020-05-07 DIAGNOSIS — D649 Anemia, unspecified: Secondary | ICD-10-CM | POA: Diagnosis not present

## 2020-05-07 DIAGNOSIS — H548 Legal blindness, as defined in USA: Secondary | ICD-10-CM | POA: Diagnosis not present

## 2020-05-07 DIAGNOSIS — Z6837 Body mass index (BMI) 37.0-37.9, adult: Secondary | ICD-10-CM | POA: Diagnosis not present

## 2020-05-07 DIAGNOSIS — M1612 Unilateral primary osteoarthritis, left hip: Secondary | ICD-10-CM | POA: Diagnosis not present

## 2020-05-07 DIAGNOSIS — K219 Gastro-esophageal reflux disease without esophagitis: Secondary | ICD-10-CM | POA: Diagnosis not present

## 2020-05-07 DIAGNOSIS — Z79890 Hormone replacement therapy (postmenopausal): Secondary | ICD-10-CM | POA: Diagnosis not present

## 2020-05-07 DIAGNOSIS — Z882 Allergy status to sulfonamides status: Secondary | ICD-10-CM | POA: Diagnosis not present

## 2020-05-07 DIAGNOSIS — E7800 Pure hypercholesterolemia, unspecified: Secondary | ICD-10-CM | POA: Diagnosis not present

## 2020-05-07 DIAGNOSIS — Z87891 Personal history of nicotine dependence: Secondary | ICD-10-CM | POA: Diagnosis not present

## 2020-05-07 DIAGNOSIS — M810 Age-related osteoporosis without current pathological fracture: Secondary | ICD-10-CM | POA: Diagnosis not present

## 2020-05-07 DIAGNOSIS — Z791 Long term (current) use of non-steroidal anti-inflammatories (NSAID): Secondary | ICD-10-CM | POA: Diagnosis not present

## 2020-05-07 DIAGNOSIS — E785 Hyperlipidemia, unspecified: Secondary | ICD-10-CM | POA: Diagnosis not present

## 2020-05-07 DIAGNOSIS — Z96642 Presence of left artificial hip joint: Secondary | ICD-10-CM | POA: Diagnosis not present

## 2020-05-07 DIAGNOSIS — E039 Hypothyroidism, unspecified: Secondary | ICD-10-CM | POA: Diagnosis not present

## 2020-05-08 DIAGNOSIS — M1612 Unilateral primary osteoarthritis, left hip: Secondary | ICD-10-CM | POA: Diagnosis not present

## 2020-05-08 DIAGNOSIS — K219 Gastro-esophageal reflux disease without esophagitis: Secondary | ICD-10-CM | POA: Diagnosis not present

## 2020-05-08 DIAGNOSIS — D649 Anemia, unspecified: Secondary | ICD-10-CM | POA: Diagnosis not present

## 2020-05-08 DIAGNOSIS — M810 Age-related osteoporosis without current pathological fracture: Secondary | ICD-10-CM | POA: Diagnosis not present

## 2020-05-08 DIAGNOSIS — E785 Hyperlipidemia, unspecified: Secondary | ICD-10-CM | POA: Diagnosis not present

## 2020-05-08 DIAGNOSIS — E7800 Pure hypercholesterolemia, unspecified: Secondary | ICD-10-CM | POA: Diagnosis not present

## 2020-05-12 DIAGNOSIS — M25652 Stiffness of left hip, not elsewhere classified: Secondary | ICD-10-CM | POA: Diagnosis not present

## 2020-05-12 DIAGNOSIS — Z96642 Presence of left artificial hip joint: Secondary | ICD-10-CM | POA: Diagnosis not present

## 2020-05-13 ENCOUNTER — Encounter: Payer: Self-pay | Admitting: Internal Medicine

## 2020-06-09 ENCOUNTER — Ambulatory Visit
Admission: RE | Admit: 2020-06-09 | Discharge: 2020-06-09 | Disposition: A | Discharge status: Home / Self Care | Payer: Medicare Other | Source: Ambulatory Visit | Attending: Internal Medicine | Admitting: Internal Medicine

## 2020-06-09 ENCOUNTER — Other Ambulatory Visit: Payer: Self-pay

## 2020-06-09 DIAGNOSIS — Z1231 Encounter for screening mammogram for malignant neoplasm of breast: Secondary | ICD-10-CM | POA: Diagnosis not present

## 2020-07-10 ENCOUNTER — Other Ambulatory Visit: Payer: Self-pay

## 2020-07-10 DIAGNOSIS — E785 Hyperlipidemia, unspecified: Secondary | ICD-10-CM

## 2020-07-10 DIAGNOSIS — F419 Anxiety disorder, unspecified: Secondary | ICD-10-CM

## 2020-07-10 DIAGNOSIS — F32A Depression, unspecified: Secondary | ICD-10-CM

## 2020-07-10 MED ORDER — CITALOPRAM HYDROBROMIDE 20 MG PO TABS: 20.0000 mg | ORAL_TABLET | Freq: Every day | ORAL | 3 refills | Status: DC

## 2020-07-10 MED ORDER — PRAVASTATIN SODIUM 40 MG PO TABS: 20.0000 mg | ORAL_TABLET | Freq: Every day | ORAL | 3 refills | Status: DC

## 2020-07-18 ENCOUNTER — Other Ambulatory Visit: Payer: Self-pay | Admitting: *Deleted

## 2020-07-18 DIAGNOSIS — D509 Iron deficiency anemia, unspecified: Secondary | ICD-10-CM

## 2020-07-21 ENCOUNTER — Encounter: Payer: Self-pay | Admitting: Oncology

## 2020-07-21 ENCOUNTER — Inpatient Hospital Stay (HOSPITAL_BASED_OUTPATIENT_CLINIC_OR_DEPARTMENT_OTHER): Payer: Medicare Other | Admitting: Oncology

## 2020-07-21 ENCOUNTER — Inpatient Hospital Stay: Payer: Medicare Other

## 2020-07-21 ENCOUNTER — Inpatient Hospital Stay: Payer: Medicare Other | Attending: Oncology

## 2020-07-21 VITALS — Temp 98.3°F | Wt 203.0 lb

## 2020-07-21 VITALS — BP 112/65 | HR 77 | Temp 96.6°F | Resp 18

## 2020-07-21 DIAGNOSIS — D509 Iron deficiency anemia, unspecified: Secondary | ICD-10-CM

## 2020-07-21 DIAGNOSIS — D508 Other iron deficiency anemias: Secondary | ICD-10-CM

## 2020-07-21 LAB — CBC WITH DIFFERENTIAL/PLATELET
Abs Immature Granulocytes: 0.02 10*3/uL (ref 0.00–0.07)
Basophils Absolute: 0 10*3/uL (ref 0.0–0.1)
Basophils Relative: 1 %
Eosinophils Absolute: 0.2 10*3/uL (ref 0.0–0.5)
Eosinophils Relative: 3 %
HCT: 28.6 % — ABNORMAL LOW (ref 36.0–46.0)
Hemoglobin: 8.3 g/dL — ABNORMAL LOW (ref 12.0–15.0)
Immature Granulocytes: 0 %
Lymphocytes Relative: 15 %
Lymphs Abs: 1 10*3/uL (ref 0.7–4.0)
MCH: 26.7 pg (ref 26.0–34.0)
MCHC: 29 g/dL — ABNORMAL LOW (ref 30.0–36.0)
MCV: 92 fL (ref 80.0–100.0)
Monocytes Absolute: 0.4 10*3/uL (ref 0.1–1.0)
Monocytes Relative: 5 %
Neutro Abs: 5.2 10*3/uL (ref 1.7–7.7)
Neutrophils Relative %: 76 %
Platelets: 142 10*3/uL — ABNORMAL LOW (ref 150–400)
RBC: 3.11 MIL/uL — ABNORMAL LOW (ref 3.87–5.11)
RDW: 16.1 % — ABNORMAL HIGH (ref 11.5–15.5)
WBC: 6.7 10*3/uL (ref 4.0–10.5)
nRBC: 0 % (ref 0.0–0.2)

## 2020-07-21 LAB — IRON AND TIBC
Iron: 21 ug/dL — ABNORMAL LOW (ref 28–170)
Saturation Ratios: 6 % — ABNORMAL LOW (ref 10.4–31.8)
TIBC: 354 ug/dL (ref 250–450)
UIBC: 333 ug/dL

## 2020-07-21 LAB — FERRITIN: Ferritin: 36 ng/mL (ref 11–307)

## 2020-07-21 MED ORDER — SODIUM CHLORIDE 0.9 % IV SOLN
510.0000 mg | Freq: Once | INTRAVENOUS | Status: AC
  Administered 2020-07-21: 510 mg via INTRAVENOUS
  Filled 2020-07-21: qty 510

## 2020-07-21 MED ORDER — SODIUM CHLORIDE 0.9 % IV SOLN
Freq: Once | INTRAVENOUS | Status: AC
  Filled 2020-07-21: qty 250

## 2020-07-21 NOTE — Progress Notes (Signed)
[name]  Telephone:([phone]) [phone]  Fax:(336) [phone]     Janet Rice DOB: 27-Dec-1945  MR#: [id number]  [id number]  Patient Care Team: McLean-Scocuzza, Nino Glow, MD as PCP - General (Internal Medicine) Lloyd Huger, MD as Consulting Physician (Hematology and Oncology)   CHIEF COMPLAINT: Iron deficiency anemia.  INTERVAL HISTORY: Patient returns to clinic today for repeat laboratory work and further evaluation.  She has been receiving IV iron every couple months since 2017.  She was last in clinic on 04/16/2020 and received 1 dose of IV Feraheme.  She admits to always feeling better post infusion.  She admits to some fatigue today and believes her levels are low.  She recently had her left hip replaced and has recovered well. She has no neurologic complaints. She denies any recent fevers or illnesses. She has a good appetite and denies weight loss.  She denies any chest pain, shortness of breath, cough, or hemoptysis.  She denies any nausea, vomiting, constipation, or diarrhea. She has no melena or hematochezia. She has no urinary complaints.  Patient offers no further specific complaints today.  REVIEW OF SYSTEMS:   Review of Systems  Constitutional: Positive for malaise/fatigue. Negative for fever and weight loss.  Eyes: Negative.   Respiratory: Negative.  Negative for cough and shortness of breath.   Cardiovascular: Negative.  Negative for chest pain and leg swelling.  Gastrointestinal: Negative.  Negative for abdominal pain, blood in stool and melena.  Genitourinary: Negative.  Negative for hematuria.  Musculoskeletal: Positive for joint pain. Negative for back pain.  Skin: Negative.  Negative for rash.  Neurological: Negative.  Negative for dizziness, sensory change, focal weakness and weakness.  Psychiatric/Behavioral: Negative.  The patient is not nervous/anxious.     As per HPI. Otherwise, a complete review of systems is negative.  ONCOLOGY  HISTORY: Oncology History   No history exists.    PAST MEDICAL HISTORY: Past Medical History:  Diagnosis Date  . Adenomatous colon polyp   . Arthritis   . Blood transfusion without reported diagnosis   . Cataract   . Childhood asthma    AS A CHILD  . Chronic heartburn    On omeprazole  . Complication of anesthesia    HARD TO WAKE UP  . Dyspnea    low hgb. and iron  chronic problem  . External hemorrhoids   . Frequency of urination   . Frequent loose stools   . GERD (gastroesophageal reflux disease)   . Heme positive stool   . History of hiatal hernia   . Hyperlipidemia   . Hypothyroidism   . Iron deficiency anemia   . Melanoma ([place])    left thigh s/p LN removal left groin  . Metastatic melanoma ([place])    Followed by Dr Oliva Bustard, previous chemo, no recurrence, 2008?  . Multinodular goiter   . Osteoporosis   . SCC (squamous cell carcinoma)    skin    PAST SURGICAL HISTORY: Past Surgical History:  Procedure Laterality Date  . ABDOMINAL HYSTERECTOMY  1984  . BACK SURGERY  03/2016   LUMBAR  . CARPAL TUNNEL RELEASE Right 09/07/2016   Procedure: CARPAL TUNNEL RELEASE;  Surgeon: Thornton Park, MD;  Location: [facility] ORS;  Service: Orthopedics;  Laterality: Right;  . CHOLECYSTECTOMY  1990  . COLONOSCOPY  08/2006   Four sessile polyps found and removed in sigmoid colon at splenic flexure and ascending colon. 7 mm in size. Another removed from transverse colon 4 mm. Path Report -  showed tubular adenoma and hyperplastic polyp. Advised to repeat in 3.5 years (02/2010)  . COLONOSCOPY  3.2.2011   8 mm polyp in sigmoid colon, removed, 4 mm polyp in descending colon, removed. Internal hemorrhoids  . ESOPHAGOGASTRODUODENOSCOPY  3.2.2011   Large hiatia hernia, esophagus normal, mulitple small sessile polyps w/no stigmata of recent bleeding found. Mildy erythermatous mucosa w/no bleeding found in gatric antrum. Normal duodenum. 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 PATH - no  active inflammation, antral mucosa w/mild foveolar hyperplasia  . FRACTURE SURGERY    . Lymph Node Removal  12/2005   Left inguinal lymph node dissection  . MELANOMA EXCISION Left 1993   Left thigh  . ORIF ANKLE FRACTURE Right    Dr. Mack Guise  . TOTAL HIP ARTHROPLASTY Right 11/18/2016   Procedure: TOTAL HIP ARTHROPLASTY;  Surgeon: Thornton Park, MD;  Location: [facility] ORS;  Service: Orthopedics;  Laterality: Right;    FAMILY HISTORY Family History  Problem Relation Age of Onset  . Aneurysm Mother   . Heart disease Mother   . Colon polyps Father   . Diabetes Father   . Dementia Father   . Stroke Father        TIA  . Leukemia Maternal Grandfather   . Skin cancer Paternal Grandfather   . Diabetes Other   . Colon polyps Other   . Colon cancer Neg Hx   . Breast cancer Neg Hx   . Stomach cancer Neg Hx   . Rectal cancer Neg Hx   . Esophageal cancer Neg Hx     GYNECOLOGIC HISTORY:  No LMP recorded. Patient has had a hysterectomy.     ADVANCED DIRECTIVES:    HEALTH MAINTENANCE: Social History   Tobacco Use  . Smoking status: Former Smoker    Packs/day: 0.50    Years: 8.00    Pack years: 4.00    Types: Cigarettes    Quit date: 07/12/1978    Years since quitting: 42.0  . Smokeless tobacco: Never Used  Vaping Use  . Vaping Use: Never used  Substance Use Topics  . Alcohol use: No    Alcohol/week: 0.0 standard drinks  . Drug use: No    Allergies  Allergen Reactions  . Sulfa Antibiotics Rash    Current Outpatient Medications  Medication Sig Dispense Refill  . acetaminophen (TYLENOL) 650 MG CR tablet Take 1,300 mg by mouth daily.    . Calcium Carbonate-Vitamin D 600-400 MG-UNIT tablet Take by mouth.    . celecoxib (CELEBREX) 200 MG capsule     . citalopram (CELEXA) 20 MG tablet Take 1 tablet (20 mg total) by mouth daily. 90 tablet 3  . Ferrous Sulfate Dried 45 MG TBCR Take 45 mg by mouth daily with lunch.    . Iron-Vitamins (GERITOL) LIQD Take by mouth.    .  levothyroxine (SYNTHROID) 125 MCG tablet Take 1 tablet (125 mcg total) by mouth daily before breakfast. Skip sundays 90 tablet 3  . loperamide (IMODIUM) 2 MG capsule Take 2 mg by mouth every morning.    . pravastatin (PRAVACHOL) 40 MG tablet Take 0.5 tablets (20 mg total) by mouth daily. 45 tablet 3  . Propylene Glycol (SYSTANE BALANCE OP) Place 1 drop into both eyes daily as needed (dry eyes).    . Coenzyme Q10 50 MG CAPS Take by mouth. (Patient not taking: No sig reported)    . Multiple Vitamins-Minerals (EYE VITAMINS & MINERALS) TABS Take 1 tablet by mouth daily.  (Patient not taking:  No sig reported)    . NON FORMULARY Apply 1 application topically 2 (two) times daily as needed (muscle pain). Horse Liniment (Patient not taking: No sig reported)     No current facility-administered medications for this visit.    OBJECTIVE: Temp 98.3 F (36.8 C) (Tympanic)   Wt 203 lb (92.1 kg)   BMI 37.13 kg/m    Body mass index is 37.13 kg/m.    ECOG FS:0 - Asymptomatic  Physical Exam Constitutional:      General: Vital signs are normal.     Appearance: Normal appearance.  HENT:     Head: Normocephalic and atraumatic.  Eyes:     Pupils: Pupils are equal, round, and reactive to light.  Cardiovascular:     Rate and Rhythm: Normal rate and regular rhythm.     Heart sounds: Normal heart sounds. No murmur heard.   Pulmonary:     Effort: Pulmonary effort is normal.     Breath sounds: Normal breath sounds. No wheezing.  Abdominal:     General: Bowel sounds are normal. There is no distension.     Palpations: Abdomen is soft.     Tenderness: There is no abdominal tenderness.  Musculoskeletal:        General: No edema. Normal range of motion.     Cervical back: Normal range of motion.  Skin:    General: Skin is warm and dry.     Findings: No rash.  Neurological:     Mental Status: She is alert and oriented to person, place, and time.  Psychiatric:        Judgment: Judgment normal.       LAB RESULTS:  CBC    Component Value Date/Time   WBC 6.7 07/21/2020 1401   RBC 3.11 (L) 07/21/2020 1401   HGB 8.3 (L) 07/21/2020 1401   HGB 9.8 (L) 02/21/2020 0847   HCT 28.6 (L) 07/21/2020 1401   HCT 31.7 (L) 02/21/2020 0847   PLT 142 (L) 07/21/2020 1401   PLT 154 02/21/2020 0847   MCV 92.0 07/21/2020 1401   MCV 91 02/21/2020 0847   MCV 74 (L) 12/12/2013 1015   MCH 26.7 07/21/2020 1401   MCHC 29.0 (L) 07/21/2020 1401   RDW 16.1 (H) 07/21/2020 1401   RDW 13.4 02/21/2020 0847   RDW 33.6 (H) 12/12/2013 1015   LYMPHSABS 1.0 07/21/2020 1401   LYMPHSABS 0.8 02/21/2020 0847   LYMPHSABS 1.2 12/12/2013 1015   MONOABS 0.4 07/21/2020 1401   MONOABS 0.4 12/12/2013 1015   EOSABS 0.2 07/21/2020 1401   EOSABS 0.1 02/21/2020 0847   EOSABS 0.1 12/12/2013 1015   BASOSABS 0.0 07/21/2020 1401   BASOSABS 0.0 02/21/2020 0847   BASOSABS 0.1 12/12/2013 1015   Lab Results  Component Value Date   IRON 21 (L) 07/21/2020   TIBC 354 07/21/2020   IRONPCTSAT 6 (L) 07/21/2020   Lab Results  Component Value Date   FERRITIN 36 07/21/2020      STUDIES: No results found.  ASSESSMENT & PLAN:    1. Iron deficiency anemia:  -Unclear etiology. -She has been receiving IV Feraheme intermittently since 2017. -Colonoscopy/EGD from 05/02/2020 did not reveal any etiology for her iron deficiency. -Proceed with IV Feraheme today and return in 1 week for a second dose. -She will come back to clinic in 3 months with repeat labs and additional IV iron at that time.  Greater than 50% was spent in counseling and coordination of care with this patient including but  not limited to discussion of the relevant topics above (See A&P) including, but not limited to diagnosis and management of acute and chronic medical conditions.   Patient expressed understanding and was in agreement with this plan. She also understands that She can call clinic at any time with any questions, concerns, or complaints.     Jacquelin Hawking, NP   07/21/2020 3:57 PM

## 2020-07-21 NOTE — Progress Notes (Signed)
Pt received IV fereheme in clinic today. Tolerated well. VSS @ d/c.

## 2020-07-29 ENCOUNTER — Inpatient Hospital Stay: Payer: Medicare Other

## 2020-07-29 VITALS — BP 106/64 | HR 96 | Temp 98.1°F | Resp 18

## 2020-07-29 DIAGNOSIS — D508 Other iron deficiency anemias: Secondary | ICD-10-CM

## 2020-07-29 DIAGNOSIS — D509 Iron deficiency anemia, unspecified: Secondary | ICD-10-CM | POA: Diagnosis not present

## 2020-07-29 MED ORDER — SODIUM CHLORIDE 0.9 % IV SOLN
INTRAVENOUS | Status: DC
  Filled 2020-07-29: qty 250

## 2020-07-29 MED ORDER — SODIUM CHLORIDE 0.9 % IV SOLN
510.0000 mg | Freq: Once | INTRAVENOUS | Status: AC
  Administered 2020-07-29: 510 mg via INTRAVENOUS
  Filled 2020-07-29: qty 17

## 2020-07-29 NOTE — Progress Notes (Signed)
Stable at discharge 

## 2020-08-27 DIAGNOSIS — Z08 Encounter for follow-up examination after completed treatment for malignant neoplasm: Secondary | ICD-10-CM | POA: Diagnosis not present

## 2020-08-27 DIAGNOSIS — Z85828 Personal history of other malignant neoplasm of skin: Secondary | ICD-10-CM | POA: Diagnosis not present

## 2020-08-27 DIAGNOSIS — C44311 Basal cell carcinoma of skin of nose: Secondary | ICD-10-CM | POA: Diagnosis not present

## 2020-08-27 DIAGNOSIS — Z85820 Personal history of malignant melanoma of skin: Secondary | ICD-10-CM | POA: Diagnosis not present

## 2020-08-27 DIAGNOSIS — Z872 Personal history of diseases of the skin and subcutaneous tissue: Secondary | ICD-10-CM | POA: Diagnosis not present

## 2020-08-27 DIAGNOSIS — D485 Neoplasm of uncertain behavior of skin: Secondary | ICD-10-CM | POA: Diagnosis not present

## 2020-08-29 ENCOUNTER — Ambulatory Visit: Payer: Medicare Other | Admitting: Internal Medicine

## 2020-09-02 ENCOUNTER — Ambulatory Visit (INDEPENDENT_AMBULATORY_CARE_PROVIDER_SITE_OTHER): Payer: Medicare Other | Admitting: Internal Medicine

## 2020-09-02 ENCOUNTER — Other Ambulatory Visit: Payer: Self-pay | Admitting: Internal Medicine

## 2020-09-02 ENCOUNTER — Encounter: Payer: Self-pay | Admitting: Internal Medicine

## 2020-09-02 ENCOUNTER — Other Ambulatory Visit: Payer: Self-pay

## 2020-09-02 VITALS — BP 110/60 | HR 60 | Temp 98.6°F | Ht 62.0 in | Wt 202.2 lb

## 2020-09-02 DIAGNOSIS — E538 Deficiency of other specified B group vitamins: Secondary | ICD-10-CM

## 2020-09-02 DIAGNOSIS — C44310 Basal cell carcinoma of skin of unspecified parts of face: Secondary | ICD-10-CM

## 2020-09-02 DIAGNOSIS — E559 Vitamin D deficiency, unspecified: Secondary | ICD-10-CM

## 2020-09-02 DIAGNOSIS — Z1231 Encounter for screening mammogram for malignant neoplasm of breast: Secondary | ICD-10-CM | POA: Diagnosis not present

## 2020-09-02 DIAGNOSIS — D649 Anemia, unspecified: Secondary | ICD-10-CM

## 2020-09-02 DIAGNOSIS — R748 Abnormal levels of other serum enzymes: Secondary | ICD-10-CM | POA: Diagnosis not present

## 2020-09-02 DIAGNOSIS — D509 Iron deficiency anemia, unspecified: Secondary | ICD-10-CM

## 2020-09-02 DIAGNOSIS — M810 Age-related osteoporosis without current pathological fracture: Secondary | ICD-10-CM | POA: Diagnosis not present

## 2020-09-02 DIAGNOSIS — E039 Hypothyroidism, unspecified: Secondary | ICD-10-CM

## 2020-09-02 DIAGNOSIS — Z1389 Encounter for screening for other disorder: Secondary | ICD-10-CM

## 2020-09-02 DIAGNOSIS — M545 Low back pain, unspecified: Secondary | ICD-10-CM

## 2020-09-02 DIAGNOSIS — G8929 Other chronic pain: Secondary | ICD-10-CM

## 2020-09-02 DIAGNOSIS — E785 Hyperlipidemia, unspecified: Secondary | ICD-10-CM | POA: Diagnosis not present

## 2020-09-02 DIAGNOSIS — D696 Thrombocytopenia, unspecified: Secondary | ICD-10-CM

## 2020-09-02 LAB — COMPREHENSIVE METABOLIC PANEL
ALT: 38 U/L — ABNORMAL HIGH (ref 0–35)
AST: 34 U/L (ref 0–37)
Albumin: 3.9 g/dL (ref 3.5–5.2)
Alkaline Phosphatase: 126 U/L — ABNORMAL HIGH (ref 39–117)
BUN: 15 mg/dL (ref 6–23)
CO2: 32 mEq/L (ref 19–32)
Calcium: 9.5 mg/dL (ref 8.4–10.5)
Chloride: 101 mEq/L (ref 96–112)
Creatinine, Ser: 1.05 mg/dL (ref 0.40–1.20)
GFR: 52.38 mL/min — ABNORMAL LOW (ref >60.00)
Glucose, Bld: 86 mg/dL (ref 70–99)
Potassium: 4.8 mEq/L (ref 3.5–5.1)
Sodium: 140 mEq/L (ref 135–145)
Total Bilirubin: 0.3 mg/dL (ref 0.2–1.2)
Total Protein: 6.5 g/dL (ref 6.0–8.3)

## 2020-09-02 LAB — CBC WITH DIFFERENTIAL/PLATELET
Basophils Absolute: 0 10*3/uL (ref 0.0–0.1)
Basophils Relative: 0.6 % (ref 0.0–3.0)
Eosinophils Absolute: 0.1 10*3/uL (ref 0.0–0.7)
Eosinophils Relative: 2.4 % (ref 0.0–5.0)
HCT: 28.1 % — ABNORMAL LOW (ref 36.0–46.0)
Hemoglobin: 9.1 g/dL — ABNORMAL LOW (ref 12.0–15.0)
Lymphocytes Relative: 17.4 % (ref 12.0–46.0)
Lymphs Abs: 0.9 10*3/uL (ref 0.7–4.0)
MCHC: 32.3 g/dL (ref 30.0–36.0)
MCV: 86.5 fl (ref 78.0–100.0)
Monocytes Absolute: 0.4 10*3/uL (ref 0.1–1.0)
Monocytes Relative: 6.7 % (ref 3.0–12.0)
Neutro Abs: 3.9 10*3/uL (ref 1.4–7.7)
Neutrophils Relative %: 72.9 % (ref 43.0–77.0)
Platelets: 147 10*3/uL — ABNORMAL LOW (ref 150.0–400.0)
RBC: 3.25 Mil/uL — ABNORMAL LOW (ref 3.87–5.11)
RDW: 16.4 % — ABNORMAL HIGH (ref 11.5–15.5)
WBC: 5.4 10*3/uL (ref 4.0–10.5)

## 2020-09-02 LAB — LIPID PANEL
Cholesterol: 176 mg/dL (ref 0–200)
HDL: 52.1 mg/dL (ref >39.00)
NonHDL: 123.88
Total CHOL/HDL Ratio: 3
Triglycerides: 209 mg/dL — ABNORMAL HIGH (ref 0.0–149.0)
VLDL: 41.8 mg/dL — ABNORMAL HIGH (ref 0.0–40.0)

## 2020-09-02 LAB — VITAMIN D 25 HYDROXY (VIT D DEFICIENCY, FRACTURES): VITD: 49.79 ng/mL (ref 30.00–100.00)

## 2020-09-02 LAB — LDL CHOLESTEROL, DIRECT: Direct LDL: 91 mg/dL

## 2020-09-02 LAB — TSH: TSH: 4.18 u[IU]/mL (ref 0.35–4.50)

## 2020-09-02 LAB — VITAMIN B12: Vitamin B-12: 594 pg/mL (ref 211–911)

## 2020-09-02 LAB — FOLATE: Folate: >23.6 ng/mL (ref >5.9)

## 2020-09-02 MED ORDER — LEVOTHYROXINE SODIUM 125 MCG PO TABS: 125.0000 ug | ORAL_TABLET | Freq: Every day | ORAL | 3 refills | Status: DC

## 2020-09-02 MED ORDER — LEVOTHYROXINE SODIUM 125 MCG PO TABS
125.0000 ug | ORAL_TABLET | Freq: Every day | ORAL | 3 refills | Status: DC
Start: 2020-09-02 — End: 2020-09-03

## 2020-09-02 NOTE — Progress Notes (Addendum)
Chief Complaint  Patient presents with  . Follow-up   F/u  1. Iron def anemia and low platelets f/u h/o and feraheme had 07/21/20 hbg was 8.3  Results for Janet, Rice (MRN [id number]) as of 09/02/2020 12:42 Had EGD/colonoscopy 04/2020 no etiology  11/16/11 capsule endoscopy Dr. Hilarie Fredrickson negative  She does have FH of iron def   07/21/2020 14:01 WBC: 6.7 RBC: 3.11 (L) Hemoglobin: 8.3 (L) HCT: 28.6 (L) MCV: 92.0 MCH: 26.7 MCHC: 29.0 (L) RDW: 16.1 (H) Platelets: 142 (L) nRBC: 0.0 Results for Janet, Rice (MRN [id number]) as of 09/02/2020 12:42  07/21/2020 14:01 Iron: 21 (L) UIBC: 333 TIBC: 354 Saturation Ratios: 6 (L) Ferritin: 36  2. Chronic back pain due to DDD/scoliosis on celebrex, tylenol tried otc pain patches but chronic and 6/10 today   3. Hypothyroidism on levo 125 mcg qd    Review of Systems  Constitutional: Negative for malaise/fatigue and weight loss.  HENT: Negative for hearing loss.   Eyes: Negative for blurred vision.  Respiratory: Negative for shortness of breath.   Cardiovascular: Negative for chest pain.  Gastrointestinal: Negative for abdominal pain.  Musculoskeletal: Positive for back pain.  Skin: Negative for rash.  Neurological: Negative for dizziness.  Psychiatric/Behavioral: Negative for memory loss.   Past Medical History:  Diagnosis Date  . Adenomatous colon polyp   . Arthritis   . Blood transfusion without reported diagnosis   . Cataract   . Childhood asthma    AS A CHILD  . Chronic heartburn    On omeprazole  . Complication of anesthesia    HARD TO WAKE UP  . Dyspnea    low hgb. and iron  chronic problem  . External hemorrhoids   . Frequency of urination   . Frequent loose stools   . GERD (gastroesophageal reflux disease)   . Heme positive stool   . History of hiatal hernia   . Hyperlipidemia   . Hypothyroidism   . Iron deficiency anemia   . Melanoma ([place])    left thigh s/p LN removal left groin  . Metastatic  melanoma ([place])    Followed by Dr Oliva Bustard, previous chemo, no recurrence, 2008?  . Multinodular goiter   . Osteoporosis   . SCC (squamous cell carcinoma)    skin   Past Surgical History:  Procedure Laterality Date  . ABDOMINAL HYSTERECTOMY  1984  . BACK SURGERY  03/2016   LUMBAR  . CARPAL TUNNEL RELEASE Right 09/07/2016   Procedure: CARPAL TUNNEL RELEASE;  Surgeon: Thornton Park, MD;  Location: [facility] ORS;  Service: Orthopedics;  Laterality: Right;  . CHOLECYSTECTOMY  1990  . COLONOSCOPY  08/2006   Four sessile polyps found and removed in sigmoid colon at splenic flexure and ascending colon. 7 mm in size. Another removed from transverse colon 4 mm. Path Report - showed tubular adenoma and hyperplastic polyp. Advised to repeat in 3.5 years (02/2010)  . COLONOSCOPY  3.2.2011   8 mm polyp in sigmoid colon, removed, 4 mm polyp in descending colon, removed. Internal hemorrhoids  . ESOPHAGOGASTRODUODENOSCOPY  3.2.2011   Large hiatia hernia, esophagus normal, mulitple small sessile polyps w/no stigmata of recent bleeding found. Mildy erythermatous mucosa w/no bleeding found in gatric antrum. Normal duodenum. 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 PATH - no active inflammation, antral mucosa w/mild foveolar hyperplasia  . FRACTURE SURGERY    . Lymph Node Removal  12/2005   Left inguinal lymph node dissection  . MELANOMA EXCISION Left 1993  Left thigh  . ORIF ANKLE FRACTURE Right    Dr. Mack Guise  . TOTAL HIP ARTHROPLASTY Right 11/18/2016   Procedure: TOTAL HIP ARTHROPLASTY;  Surgeon: Thornton Park, MD;  Location: [facility] ORS;  Service: Orthopedics;  Laterality: Right;  . TOTAL HIP ARTHROPLASTY     left   Family History  Problem Relation Age of Onset  . Aneurysm Mother   . Heart disease Mother   . Colon polyps Father   . Diabetes Father   . Dementia Father   . Stroke Father        TIA  . Macular degeneration Father   . Leukemia Maternal Grandfather   . Skin cancer Paternal  Grandfather   . Diabetes Other   . Colon polyps Other   . Hypothyroidism Daughter   . Hypothyroidism Son   . Colon cancer Neg Hx   . Breast cancer Neg Hx   . Stomach cancer Neg Hx   . Rectal cancer Neg Hx   . Esophageal cancer Neg Hx    Social History   Socioeconomic History  . Marital status: Married    Spouse name: Not on file  . Number of children: 2  . Years of education: Not on file  . Highest education level: Not on file  Occupational History  . Occupation: HR Consultant    Employer: [employer]  Tobacco Use  . Smoking status: Former Smoker    Packs/day: 0.50    Years: 8.00    Pack years: 4.00    Types: Cigarettes    Quit date: 07/12/1978    Years since quitting: 42.1  . Smokeless tobacco: Never Used  Vaping Use  . Vaping Use: Never used  Substance and Sexual Activity  . Alcohol use: No    Alcohol/week: 0.0 standard drinks  . Drug use: No  . Sexual activity: Not Currently  Other Topics Concern  . Not on file  Social History Narrative   Part time [employer] as of 03/10/20 retired 08/29/20    Married    1 daughter    1 son    Social Determinants of Radio broadcast assistant Strain: Low Risk   . Difficulty of Paying Living Expenses: Not hard at all  Food Insecurity: No Food Insecurity  . Worried About Charity fundraiser in the Last Year: Never true  . Ran Out of Food in the Last Year: Never true  Transportation Needs: No Transportation Needs  . Lack of Transportation (Medical): No  . Lack of Transportation (Non-Medical): No  Physical Activity: Not on file  Stress: No Stress Concern Present  . Feeling of Stress : Not at all  Social Connections: Unknown  . Frequency of Communication with Friends and Family: Not on file  . Frequency of Social Gatherings with Friends and Family: Not on file  . Attends Religious Services: Not on file  . Active Member of Clubs or Organizations: Not on file  . Attends Archivist Meetings: Not on file  . Marital Status:  Married  Human resources officer Violence: Not At Risk  . Fear of Current or Ex-Partner: No  . Emotionally Abused: No  . Physically Abused: No  . Sexually Abused: No   Current Meds  Medication Sig  . acetaminophen (TYLENOL) 650 MG CR tablet Take 1,300 mg by mouth daily.  . B Complex Vitamins (B COMPLEX PO) Take by mouth.  . Calcium Carbonate-Vitamin D 600-400 MG-UNIT tablet Take by mouth.  . celecoxib (CELEBREX) 200 MG capsule   .  citalopram (CELEXA) 20 MG tablet Take 1 tablet (20 mg total) by mouth daily.  . Ferrous Sulfate Dried 45 MG TBCR Take 45 mg by mouth daily with lunch.  . levothyroxine (SYNTHROID) 125 MCG tablet Take 1 tablet (125 mcg total) by mouth daily before breakfast. Skip sundays  . loperamide (IMODIUM) 2 MG capsule Take 2 mg by mouth every morning.  . Multiple Vitamins-Minerals (EYE VITAMINS & MINERALS) TABS Take 1 tablet by mouth daily.  . NON FORMULARY Apply 1 application topically 2 (two) times daily as needed (muscle pain). Horse Liniment  . pravastatin (PRAVACHOL) 40 MG tablet Take 0.5 tablets (20 mg total) by mouth daily.  Marland Kitchen Propylene Glycol (SYSTANE BALANCE OP) Place 1 drop into both eyes daily as needed (dry eyes).   Allergies  Allergen Reactions  . Sulfa Antibiotics Rash   Recent Results (from the past 2160 hour(s))  Iron and TIBC     Status: Abnormal   Collection Time: 07/21/20  2:01 PM  Result Value Ref Range   Iron 21 (L) 28 - 170 ug/dL   TIBC 354 250 - 450 ug/dL   Saturation Ratios 6 (L) 10.4 - 31.8 %   UIBC 333 ug/dL    Comment: Performed at [facility], [address] [place], [place] [address]  Ferritin     Status: None   Collection Time: 07/21/20  2:01 PM  Result Value Ref Range   Ferritin 36 11 - 307 ng/mL    Comment: Performed at [facility], [address] [place], [place] [address]  CBC with Differential/Platelet     Status: Abnormal   Collection Time: 07/21/20  2:01 PM  Result Value Ref Range   WBC 6.7 4.0 - 10.5 K/uL    RBC 3.11 (L) 3.87 - 5.11 MIL/uL   Hemoglobin 8.3 (L) 12.0 - 15.0 g/dL   HCT 28.6 (L) 36.0 - 46.0 %   MCV 92.0 80.0 - 100.0 fL   MCH 26.7 26.0 - 34.0 pg   MCHC 29.0 (L) 30.0 - 36.0 g/dL   RDW 16.1 (H) 11.5 - 15.5 %   Platelets 142 (L) 150 - 400 K/uL   nRBC 0.0 0.0 - 0.2 %   Neutrophils Relative % 76 %   Neutro Abs 5.2 1.7 - 7.7 K/uL   Lymphocytes Relative 15 %   Lymphs Abs 1.0 0.7 - 4.0 K/uL   Monocytes Relative 5 %   Monocytes Absolute 0.4 0.1 - 1.0 K/uL   Eosinophils Relative 3 %   Eosinophils Absolute 0.2 0.0 - 0.5 K/uL   Basophils Relative 1 %   Basophils Absolute 0.0 0.0 - 0.1 K/uL   Immature Granulocytes 0 %   Abs Immature Granulocytes 0.02 0.00 - 0.07 K/uL    Comment: Performed at [facility], [address] [place], [place] [address]   Objective  Body mass index is 36.98 kg/m. Wt Readings from Last 3 Encounters:  09/02/20 202 lb 3.2 oz (91.7 kg)  07/21/20 203 lb (92.1 kg)  05/02/20 201 lb (91.2 kg)   Temp Readings from Last 3 Encounters:  09/02/20 98.6 F (37 C) (Oral)  07/29/20 98.1 F (36.7 C) (Tympanic)  07/21/20 (!) 96.6 F (35.9 C) (Tympanic)   BP Readings from Last 3 Encounters:  09/02/20 110/60  07/29/20 106/64  07/21/20 112/65   Pulse Readings from Last 3 Encounters:  09/02/20 60  07/29/20 96  07/21/20 77    Physical Exam Vitals and nursing note reviewed.  Constitutional:  Appearance: Normal appearance. She is well-developed and well-groomed. She is obese.  HENT:     Head: Normocephalic and atraumatic.  Eyes:     Conjunctiva/sclera: Conjunctivae normal.     Pupils: Pupils are equal, round, and reactive to light.  Cardiovascular:     Rate and Rhythm: Normal rate and regular rhythm.     Heart sounds: Normal heart sounds. No murmur heard.   Pulmonary:     Effort: Pulmonary effort is normal.     Breath sounds: Normal breath sounds.  Abdominal:     Tenderness: There is no abdominal tenderness.  Skin:    General: Skin is  warm and moist.  Neurological:     General: No focal deficit present.     Mental Status: She is alert and oriented to person, place, and time. Mental status is at baseline.     Gait: Gait normal.  Psychiatric:        Attention and Perception: Attention and perception normal.        Mood and Affect: Mood and affect normal.        Speech: Speech normal.        Behavior: Behavior normal. Behavior is cooperative.        Thought Content: Thought content normal.        Cognition and Memory: Cognition and memory normal.        Judgment: Judgment normal.     Assessment  Plan  Hypothyroidism, unspecified type - Plan: TSH Levo 125 mcg qd   Hyperlipidemia, unspecified hyperlipidemia type - Plan: Lipid panel, Comprehensive metabolic panel Not fasting today but doing labs on pravachol 40 mg qhs   Iron deficiency anemia, unspecified iron deficiency anemia type - Plan: CBC with Differential/Platelet, Iron, TIBC and Ferritin Panel Had fereheme 07/21/20  B12 deficiency - Plan: Vitamin B12 Check SPEP/UPEP r/o MM Folate  Continue to f/u h/o   Vitamin D deficiency - Plan: Vitamin D (25 hydroxy)  Chronic back pain  Consider tramadol in the future disc with pt today   Elevated alkaline phos and GGT per GI disc Dr.Pyrtle   Janet Rice, I reviewed her chart and lab work. Elevated alkaline phosphatase with mild elevation in GGT. This is certainly fluctuated but been present for at least 5 years. Liver was normal by ultrasound in 2020 there was mild dilation of the common bile duct likely secondary to cholecystectomy state. I would recommend that you order a fractionated alkaline phosphatase, check a 5-nucleotidase, and an antimitochondrial antibody. Depending on these results we may consider MRCP.   Regarding her iron deficiency after having her upper endoscopy and colonoscopy late last year her low iron portion of her anemia is very likely related to her large hiatal hernia and recurrent Cameron's  erosions. She will very likely need periodic IV iron unless her hiatal hernia were to be fixed  Thanks JMP  Dr. Hilarie Fredrickson     HM Had flu shotutd  pna 23utd  prevnar utd  Tdap had 03/24/12 shingrix given Rx today also check with [facility] covid 3/3 pfizer   01/05/2019 dexa + osteopeorosis never took any medssch 01/05/19 +osteoporosis consider prolia given info today 02/27/20 and 09/02/20, dexa will do with next mammo 05/2021   Hep C negative in the past per pt husband has hep C; 10/15/15 negative   Colonoscopy had 10/02/14 5 polyps Egd/colonoscopy had 04/2020  mammo neg 12/17/18pt to call to schedulesch 6/26/20negative  06/09/20 negative  -call to schedule repeat  S/p hysterctomy age 77 y.o  for cysts no h/o abnormal pap per pt   Dr. Grayland Ormond f/u Q3 months h/o iron def anemia chronically  Derm appt Dr. Kellie Moor seen summer 2021 f/u in 6 months h/o MM Left nose bx 2022 ROI sent today   Ortho Dr. Raliegh Ip b/l hip replacement   Former smoker 40 years ago quit smoked <1 ppd total6-7 years  [facility] 01/10/19 records left hip OA advanced left knee OA Dr. Raliegh Ip given injection and will f/u PRN Dr. Raliegh Ip as of 02/27/20 disc. ? TKR left vs total hip left and s/p right total hip   [place] [facility] saw Dr. Arnoldo Morale 03/06/19 deg scoliosis, spondylolisthesis, spinal stenosis with neurogenic claudication doing well 9 months s/p extension of fusion Xrays look good f/u in 6 months  [place] [facility] 09/07/19 >1 year s/p extension of lumbar fusion doing well some back pain with exertion neurogenic claudication resolved on celebrex 200 mg qd and tylenol good relief  Left nose bx 08/2020 Dr. Kellie Moor   Eye exam due daughter checks  S/p cataract surgery b/l    Provider: Dr. Olivia Rice [facility]

## 2020-09-02 NOTE — Patient Instructions (Addendum)
Consider tramadol for chronic back pain in the future  Lidocaine or salonpas pain patches    Iron Deficiency Anemia, Adult Iron deficiency anemia is when you do not have enough red blood cells or hemoglobin in your blood. This happens because you have too little iron in your body. Hemoglobin carries oxygen to parts of the body. Anemia can cause your body to not get enough oxygen. What are the causes?  Not eating enough foods that have iron in them.  The body not being able to take in iron well.  Needing more iron due to pregnancy or heavy menstrual periods, for females.  Cancer.  Bleeding in the bowels.  Many blood draws. What increases the risk?  Being pregnant.  Being a teenage girl going through a growth spurt. What are the signs or symptoms?  Pale skin, lips, and nails.  Weakness, dizziness, and getting tired easily.  Headache.  Feeling like you cannot breathe well when moving (shortness of breath).  Cold hands and feet.  Fast heartbeat or a heartbeat that is not regular.  Feeling grouchy (irritable) or breathing fast. These are more common in very bad anemia. Mild anemia may not cause any symptoms. How is this treated? This condition is treated by finding out why you do not have enough iron and then getting more iron. It may include:  Adding foods to your diet that have a lot of iron.  Taking iron pills (supplements). If you are pregnant or breastfeeding, you may need to take extra iron. Your diet often does not provide the amount of iron that you need.  Getting more vitamin C in your diet. Vitamin C helps your body take in iron. You may need to take iron pills with a glass of orange juice or vitamin C pills.  Medicines to make heavy menstrual periods lighter.  Surgery. You may need blood tests to see if treatment is working. If the treatment does not seem to be working, you may need more tests. Follow these instructions at home: Medicines  Take  over-the-counter and prescription medicines only as told by your doctor. This includes iron pills and vitamins. ? Take iron pills when your stomach is empty. If you cannot handle this, take them with food. ? Do not drink milk or take antacids at the same time as your iron pills. ? Iron pills may turn your poop (stool)black.  If you cannot handle taking iron pills by mouth, ask your doctor about getting iron through: ? An IV tube. ? A shot (injection) into a muscle. Eating and drinking  Talk with your doctor before changing the foods you eat. He or she may tell you to eat foods that have a lot of iron, such as: ? Liver. ? Low-fat (lean) beef. ? Breads and cereals that have iron added to them. ? Eggs. ? Dried fruit. ? Dark green, leafy vegetables.  Eat fresh fruits and vegetables that are high in vitamin C. They help your body use iron. Foods with a lot of vitamin C include: ? Oranges. ? Peppers. ? Tomatoes. ? Mangoes.  Drink enough fluid to keep your pee (urine) pale yellow.   Managing constipation If you are taking iron pills, they may cause trouble pooping (constipation). To prevent or treat trouble pooping, you may need to:  Take over-the-counter or prescription medicines.  Eat foods that are high in fiber. These include beans, whole grains, and fresh fruits and vegetables.  Limit foods that are high in fat and sugar. These  include fried or sweet foods. General instructions  Return to your normal activities as told by your doctor. Ask your doctor what activities are safe for you.  Keep yourself clean, and keep things clean around you.  Keep all follow-up visits as told by your doctor. This is important. Contact a doctor if:  You feel like you may vomit (nauseous), or you vomit.  You feel weak.  You are sweating for no reason.  You have trouble pooping, such as: ? Pooping less than 3 times a week. ? Straining to poop. ? Having poop that is hard, dry, or larger than  normal. ? Feeling full or bloated. ? Pain in the lower belly. ? Not feeling better after pooping. Get help right away if:  You pass out (faint).  You have chest pain.  You have trouble breathing that: ? Is very bad. ? Gets worse with physical activity.  You have a fast heartbeat, or a heartbeat that does not feel regular.  You get light-headed when getting up from sitting or lying down. These symptoms may be an emergency. Do not wait to see if the symptoms will go away. Get medical help right away. Call your local emergency services (911 in the [place] Do not drive yourself to the hospital. Summary  Iron deficiency anemia is when you have too little iron in your body.  This condition is treated by finding out why you do not have enough iron in your body and then getting more iron.  Take over-the-counter and prescription medicines only as told by your doctor.  Eat fresh fruits and vegetables that are high in vitamin C.  Get help right away if you cannot breathe well. This information is not intended to replace advice given to you by your health care provider. Make sure you discuss any questions you have with your health care provider. Document Revised: 03/06/2019 Document Reviewed: 03/06/2019 Elsevier Patient Education  2021 [place].   Denosumab injection What is this medicine? DENOSUMAB (den oh sue mab) slows bone breakdown. Prolia is used to treat osteoporosis in women after menopause and in men, and in people who are taking corticosteroids for 6 months or more. Delton See is used to treat a high calcium level due to cancer and to prevent bone fractures and other bone problems caused by multiple myeloma or cancer bone metastases. Delton See is also used to treat giant cell tumor of the bone. This medicine may be used for other purposes; ask your health care provider or pharmacist if you have questions. COMMON BRAND NAME(S): Prolia, XGEVA What should I tell my health care provider  before I take this medicine? They need to know if you have any of these conditions:  dental disease  having surgery or tooth extraction  infection  kidney disease  low levels of calcium or Vitamin D in the blood  malnutrition  on hemodialysis  skin conditions or sensitivity  thyroid or parathyroid disease  an unusual reaction to denosumab, other medicines, foods, dyes, or preservatives  pregnant or trying to get pregnant  breast-feeding How should I use this medicine? This medicine is for injection under the skin. It is given by a health care professional in a hospital or clinic setting. A special MedGuide will be given to you before each treatment. Be sure to read this information carefully each time. For Prolia, talk to your pediatrician regarding the use of this medicine in children. Special care may be needed. For Delton See, talk to your pediatrician regarding the  use of this medicine in children. While this drug may be prescribed for children as young as 13 years for selected conditions, precautions do apply. Overdosage: If you think you have taken too much of this medicine contact a poison control center or emergency room at once. NOTE: This medicine is only for you. Do not share this medicine with others. What if I miss a dose? It is important not to miss your dose. Call your doctor or health care professional if you are unable to keep an appointment. What may interact with this medicine? Do not take this medicine with any of the following medications:  other medicines containing denosumab This medicine may also interact with the following medications:  medicines that lower your chance of fighting infection  steroid medicines like prednisone or cortisone This list may not describe all possible interactions. Give your health care provider a list of all the medicines, herbs, non-prescription drugs, or dietary supplements you use. Also tell them if you smoke, drink alcohol,  or use illegal drugs. Some items may interact with your medicine. What should I watch for while using this medicine? Visit your doctor or health care professional for regular checks on your progress. Your doctor or health care professional may order blood tests and other tests to see how you are doing. Call your doctor or health care professional for advice if you get a fever, chills or sore throat, or other symptoms of a cold or flu. Do not treat yourself. This drug may decrease your body's ability to fight infection. Try to avoid being around people who are sick. You should make sure you get enough calcium and vitamin D while you are taking this medicine, unless your doctor tells you not to. Discuss the foods you eat and the vitamins you take with your health care professional. See your dentist regularly. Brush and floss your teeth as directed. Before you have any dental work done, tell your dentist you are receiving this medicine. Do not become pregnant while taking this medicine or for 5 months after stopping it. Talk with your doctor or health care professional about your birth control options while taking this medicine. Women should inform their doctor if they wish to become pregnant or think they might be pregnant. There is a potential for serious side effects to an unborn child. Talk to your health care professional or pharmacist for more information. What side effects may I notice from receiving this medicine? Side effects that you should report to your doctor or health care professional as soon as possible:  allergic reactions like skin rash, itching or hives, swelling of the face, lips, or tongue  bone pain  breathing problems  dizziness  jaw pain, especially after dental work  redness, blistering, peeling of the skin  signs and symptoms of infection like fever or chills; cough; sore throat; pain or trouble passing urine  signs of low calcium like fast heartbeat, muscle cramps or  muscle pain; pain, tingling, numbness in the hands or feet; seizures  unusual bleeding or bruising  unusually weak or tired Side effects that usually do not require medical attention (report to your doctor or health care professional if they continue or are bothersome):  constipation  diarrhea  headache  joint pain  loss of appetite  muscle pain  runny nose  tiredness  upset stomach This list may not describe all possible side effects. Call your doctor for medical advice about side effects. You may report side effects to FDA at  1-800-FDA-1088. Where should I keep my medicine? This medicine is only given in a clinic, doctor's office, or other health care setting and will not be stored at home. NOTE: This sheet is a summary. It may not cover all possible information. If you have questions about this medicine, talk to your doctor, pharmacist, or health care provider.  2021 Elsevier/Gold Standard (2017-11-04 16:10:44)  Osteoporosis  Osteoporosis happens when the bones become thin and less dense than normal. Osteoporosis makes bones more brittle and fragile and more likely to break (fracture). Over time, osteoporosis can cause your bones to become so weak that they fracture after a minor fall. Bones in the hip, wrist, and spine are most likely to fracture due to osteoporosis. What are the causes? The exact cause of this condition is not known. What increases the risk? You are more likely to develop this condition if you:  Have family members with this condition.  Have poor nutrition.  Use the following: ? Steroid medicines, such as prednisone. ? Anti-seizure medicines. ? Nicotine or tobacco, such as cigarettes, e-cigarettes, and chewing tobacco.  Are female.  Are age 44 or older.  Are not physically active (are sedentary).  Are of European or Asian descent.  Have a small body frame. What are the signs or symptoms? A fracture might be the first sign of  osteoporosis, especially if the fracture results from a fall or injury that usually would not cause a bone to break. Other signs and symptoms include:  Pain in the neck or low back.  Stooped posture.  Loss of height. How is this diagnosed? This condition may be diagnosed based on:  Your medical history.  A physical exam.  A bone mineral density test, also called a DXA or DEXA test (dual-energy X-ray absorptiometry test). This test uses X-rays to measure the amount of minerals in your bones. How is this treated? This condition may be treated by:  Making lifestyle changes, such as: ? Including foods with more calcium and vitamin D in your diet. ? Doing weight-bearing and muscle-strengthening exercises. ? Stopping tobacco use. ? Limiting alcohol intake.  Taking medicine to slow the process of bone loss or to increase bone density.  Taking daily supplements of calcium and vitamin D.  Taking hormone replacement medicines, such as estrogen for women and testosterone for men.  Monitoring your levels of calcium and vitamin D. The goal of treatment is to strengthen your bones and lower your risk for a fracture. Follow these instructions at home: Eating and drinking Include calcium and vitamin D in your diet. Calcium is important for bone health, and vitamin D helps your body absorb calcium. Good sources of calcium and vitamin D include:  Certain fatty fish, such as salmon and tuna.  Products that have calcium and vitamin D added to them (are fortified), such as fortified cereals.  Egg yolks.  Cheese.  Liver.   Activity Do exercises as told by your health care provider. Ask your health care provider what exercises and activities are safe for you. You should do:  Exercises that make you work against gravity (weight-bearing exercises), such as tai chi, yoga, or walking.  Exercises to strengthen muscles, such as lifting weights. Lifestyle  Do not drink alcohol if: ? Your  health care provider tells you not to drink. ? You are pregnant, may be pregnant, or are planning to become pregnant.  If you drink alcohol: ? Limit how much you use to:  0-1 drink a day for women.  0-2 drinks a day for men.  Know how much alcohol is in your drink. In the [place] one drink equals one 12 oz bottle of beer (355 mL), one 5 oz glass of wine (148 mL), or one 1 oz glass of hard liquor (44 mL).  Do not use any products that contain nicotine or tobacco, such as cigarettes, e-cigarettes, and chewing tobacco. If you need help quitting, ask your health care provider. Preventing falls  Use devices to help you move around (mobility aids) as needed, such as canes, walkers, scooters, or crutches.  Keep rooms well-lit and clutter-free.  Remove tripping hazards from walkways, including cords and throw rugs.  Install grab bars in bathrooms and safety rails on stairs.  Use rubber mats in the bathroom and other areas that are often wet or slippery.  Wear closed-toe shoes that fit well and support your feet. Wear shoes that have rubber soles or low heels.  Review your medicines with your health care provider. Some medicines can cause dizziness or changes in blood pressure, which can increase your risk of falling. General instructions  Take over-the-counter and prescription medicines only as told by your health care provider.  Keep all follow-up visits. This is important. Contact a health care provider if:  You have never been screened for osteoporosis and you are: ? A woman who is age 82 or older. ? A man who is age 9 or older. Get help right away if:  You fall or injure yourself. Summary  Osteoporosis is thinning and loss of density in your bones. This makes bones more brittle and fragile and more likely to break (fracture),even with minor falls.  The goal of treatment is to strengthen your bones and lower your risk for a fracture.  Include calcium and vitamin D in your  diet. Calcium is important for bone health, and vitamin D helps your body absorb calcium.  Talk with your health care provider about screening for osteoporosis if you are a woman who is age 72 or older, or a man who is age 78 or older. This information is not intended to replace advice given to you by your health care provider. Make sure you discuss any questions you have with your health care provider. Document Revised: 12/13/2019 Document Reviewed: 12/13/2019 Elsevier Patient Education  [place].

## 2020-09-03 ENCOUNTER — Other Ambulatory Visit: Payer: Medicare Other

## 2020-09-03 ENCOUNTER — Encounter: Payer: Self-pay | Admitting: Internal Medicine

## 2020-09-03 DIAGNOSIS — D649 Anemia, unspecified: Secondary | ICD-10-CM

## 2020-09-03 DIAGNOSIS — D696 Thrombocytopenia, unspecified: Secondary | ICD-10-CM

## 2020-09-03 MED ORDER — LEVOTHYROXINE SODIUM 125 MCG PO TABS: 125.0000 ug | ORAL_TABLET | Freq: Every day | ORAL | 3 refills | Status: DC

## 2020-09-04 ENCOUNTER — Encounter: Payer: Self-pay | Admitting: Internal Medicine

## 2020-09-04 ENCOUNTER — Other Ambulatory Visit: Payer: Self-pay

## 2020-09-04 ENCOUNTER — Other Ambulatory Visit: Payer: Medicare Other

## 2020-09-04 DIAGNOSIS — D649 Anemia, unspecified: Secondary | ICD-10-CM | POA: Diagnosis not present

## 2020-09-04 DIAGNOSIS — C4491 Basal cell carcinoma of skin, unspecified: Secondary | ICD-10-CM | POA: Insufficient documentation

## 2020-09-04 DIAGNOSIS — Z1389 Encounter for screening for other disorder: Secondary | ICD-10-CM | POA: Diagnosis not present

## 2020-09-04 LAB — PROTEIN ELECTROPHORESIS, SERUM
A/G Ratio: 1.3 (ref 0.7–1.7)
Albumin ELP: 3.6 g/dL (ref 2.9–4.4)
Alpha 1: 0.3 g/dL (ref 0.0–0.4)
Alpha 2: 0.7 g/dL (ref 0.4–1.0)
Beta: 0.9 g/dL (ref 0.7–1.3)
Gamma Globulin: 1 g/dL (ref 0.4–1.8)
Globulin, Total: 2.8 g/dL (ref 2.2–3.9)
Total Protein: 6.4 g/dL (ref 6.0–8.5)

## 2020-09-04 LAB — IRON,TIBC AND FERRITIN PANEL
Ferritin: 61 ng/mL (ref 15–150)
Iron Saturation: 7 % — CL (ref 15–55)
Iron: 20 ug/dL — ABNORMAL LOW (ref 27–139)
Total Iron Binding Capacity: 278 ug/dL (ref 250–450)
UIBC: 258 ug/dL (ref 118–369)

## 2020-09-04 LAB — ALKALINE PHOSPHATASE, ISOENZYMES

## 2020-09-05 ENCOUNTER — Telehealth: Payer: Self-pay

## 2020-09-05 LAB — PATHOLOGIST SMEAR REVIEW
Basophils Absolute: 0 10*3/uL (ref 0.0–0.2)
Basos: 1 %
EOS (ABSOLUTE): 0.1 10*3/uL (ref 0.0–0.4)
Eos: 2 %
Hematocrit: 29.5 % — ABNORMAL LOW (ref 34.0–46.6)
Hemoglobin: 9.1 g/dL — ABNORMAL LOW (ref 11.1–15.9)
Immature Grans (Abs): 0 10*3/uL (ref 0.0–0.1)
Immature Granulocytes: 1 %
Lymphocytes Absolute: 1 10*3/uL (ref 0.7–3.1)
Lymphs: 18 %
MCH: 27.7 pg (ref 26.6–33.0)
MCHC: 30.8 g/dL — ABNORMAL LOW (ref 31.5–35.7)
MCV: 90 fL (ref 79–97)
Monocytes Absolute: 0.4 10*3/uL (ref 0.1–0.9)
Monocytes: 7 %
Neutrophils Absolute: 4 10*3/uL (ref 1.4–7.0)
Neutrophils: 71 %
Platelets: 153 10*3/uL (ref 150–450)
RBC: 3.28 x10E6/uL — ABNORMAL LOW (ref 3.77–5.28)
RDW: 14.5 % (ref 11.7–15.4)
WBC: 5.5 10*3/uL (ref 3.4–10.8)

## 2020-09-05 NOTE — Telephone Encounter (Signed)
Faxed dermatopathology report back to [phone] with note from Dr [facility] about treament plan

## 2020-09-06 LAB — ALKALINE PHOSPHATASE, ISOENZYMES
Alkaline Phosphatase: 147 IU/L — ABNORMAL HIGH (ref 44–121)
LIVER FRACTION: 75 % (ref 18–85)

## 2020-09-08 ENCOUNTER — Telehealth: Payer: Self-pay

## 2020-09-08 NOTE — Telephone Encounter (Signed)
Pt called about lab results. °

## 2020-09-09 LAB — URINALYSIS, ROUTINE W REFLEX MICROSCOPIC
Bilirubin, UA: NEGATIVE
Glucose, UA: NEGATIVE
Ketones, UA: NEGATIVE
Leukocytes,UA: NEGATIVE
Nitrite, UA: NEGATIVE
Protein,UA: NEGATIVE
RBC, UA: NEGATIVE
Specific Gravity, UA: 1.008 (ref 1.005–1.030)
Urobilinogen, Ur: 0.2 mg/dL (ref 0.2–1.0)
pH, UA: 5 (ref 5.0–7.5)

## 2020-09-09 LAB — PROTEIN ELECTROPHORESIS, URINE REFLEX
Albumin ELP, Urine: 0 %
Alpha-1-Globulin, U: 0 %
Alpha-2-Globulin, U: 0 %
Beta Globulin, U: 0 %
Gamma Globulin, U: 0 %
Protein, Ur: <4 mg/dL

## 2020-09-09 NOTE — Telephone Encounter (Signed)
Patient informed and verbalized understanding

## 2020-09-10 ENCOUNTER — Telehealth: Payer: Self-pay | Admitting: Internal Medicine

## 2020-09-10 ENCOUNTER — Encounter: Payer: Self-pay | Admitting: Internal Medicine

## 2020-09-10 DIAGNOSIS — Z8719 Personal history of other diseases of the digestive system: Secondary | ICD-10-CM | POA: Insufficient documentation

## 2020-09-10 NOTE — Telephone Encounter (Signed)
Per GI Dr. Hilarie Fredrickson Elevated alkaline phosphatase with mild elevation in GGT. This is certainly fluctuated but been present for at least 5 years.  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 Hilarie Fredrickson rec further labs  -is pt agreeable?  I would recommend that you order a fractionated alkaline phosphatase, check a 5-nucleotidase, and an antimitochondrial antibody.  Depending on these results we may consider MRCP.    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 She will very likely need periodic IV iron unless her hiatal hernia were to be fixed   Thanks  Dr. Hilarie Fredrickson

## 2020-09-11 NOTE — Telephone Encounter (Signed)
Patient informed and verbalized understanding.   Scheduled to come in 09/15/20 for further labs

## 2020-09-12 ENCOUNTER — Encounter: Payer: Self-pay | Admitting: Internal Medicine

## 2020-09-12 NOTE — Addendum Note (Signed)
Addended by: Orland Mustard on: 09/12/2020 07:02 PM   Modules accepted: Orders

## 2020-09-12 NOTE — Telephone Encounter (Signed)
Per GI Dr. Hilarie Fredrickson Elevated alkaline phosphatase with mild elevation in GGT. This is certainly fluctuated but been present for at least 5 years.  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 Hilarie Fredrickson rec further labs  -is pt agreeable?  I would recommend that you order a fractionated alkaline phosphatase, check a 5-nucleotidase, and an antimitochondrial antibody.  Depending on these results we may consider MRCP.    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 She will very likely need periodic IV iron unless her hiatal hernia were to be fixed   Thanks  Dr. Hilarie Fredrickson

## 2020-09-15 ENCOUNTER — Other Ambulatory Visit (INDEPENDENT_AMBULATORY_CARE_PROVIDER_SITE_OTHER): Payer: Medicare Other

## 2020-09-15 ENCOUNTER — Other Ambulatory Visit: Payer: Self-pay

## 2020-09-15 DIAGNOSIS — R748 Abnormal levels of other serum enzymes: Secondary | ICD-10-CM

## 2020-09-17 LAB — NUCLEOTIDASE, 5', BLOOD: 5-Nucleotidase: 8 IU/L (ref 0–10)

## 2020-09-17 LAB — MITOCHONDRIAL ANTIBODIES: Mitochondrial Ab: <20 Units (ref 0.0–20.0)

## 2020-09-25 LAB — ALKALINE PHOSPHATASE, ISOENZYMES
Alkaline Phosphatase: 147 IU/L — ABNORMAL HIGH (ref 44–121)
BONE FRACTION: 23 % (ref 14–68)
INTESTINAL FRAC.: 2 % (ref 0–18)
LIVER FRACTION: 75 % (ref 18–85)

## 2020-09-25 LAB — SPECIMEN STATUS REPORT

## 2020-09-25 LAB — GAMMA GT: GGT: 69 IU/L — ABNORMAL HIGH (ref 0–60)

## 2020-10-06 DIAGNOSIS — R748 Abnormal levels of other serum enzymes: Secondary | ICD-10-CM | POA: Insufficient documentation

## 2020-10-08 ENCOUNTER — Other Ambulatory Visit: Payer: Self-pay | Admitting: Internal Medicine

## 2020-10-08 DIAGNOSIS — E039 Hypothyroidism, unspecified: Secondary | ICD-10-CM

## 2020-10-18 NOTE — Progress Notes (Signed)
[place]  [phone]  Fax:([phone]     Janet Rice DOB: [birth year]  MR#: [id number]  WEX#:[id number]  Patient Care Team: McLean-Scocuzza, Nino Glow, MD as PCP - General (Internal Medicine) Lloyd Huger, MD as Consulting Physician (Hematology and Oncology)   CHIEF COMPLAINT: Iron deficiency anemia.  INTERVAL HISTORY: Patient returns to clinic today for repeat laboratory work, further evaluation, and continuation of IV Feraheme if necessary.  She has noticed increased weakness and fatigue of the past several weeks, but otherwise feels well.  She has no neurologic complaints. She denies any recent fevers or illnesses. She has a good appetite and denies weight loss.  She denies any chest pain, shortness of breath, cough, or hemoptysis.  She denies any nausea, vomiting, constipation, or diarrhea. She has no melena or hematochezia. She has no urinary complaints.  Patient offers no further specific complaints today.  REVIEW OF SYSTEMS:   Review of Systems  Constitutional: Positive for malaise/fatigue. Negative for fever and weight loss.  Eyes: Negative.   Respiratory: Negative.  Negative for cough and shortness of breath.   Cardiovascular: Negative.  Negative for chest pain and leg swelling.  Gastrointestinal: Negative.  Negative for abdominal pain, blood in stool and melena.  Genitourinary: Negative.  Negative for hematuria.  Musculoskeletal: Negative.  Negative for back pain and joint pain.  Skin: Negative.  Negative for rash.  Neurological: Positive for weakness. Negative for dizziness, sensory change and focal weakness.  Psychiatric/Behavioral: Negative.  The patient is not nervous/anxious.     As per HPI. Otherwise, a complete review of systems is negative.  ONCOLOGY HISTORY: Oncology History   No history exists.    PAST MEDICAL HISTORY: Past Medical History:  Diagnosis Date  . Adenomatous colon polyp   . Arthritis   . Basal cell  carcinoma    left supratip of nose nodular edges involved 08/27/20 Dr. Kellie Moor, left postauricular sulcus nodular bcc 01/02/20 exc 01/23/20 neg margins  . Blood transfusion without reported diagnosis   . Cataract   . Childhood asthma    AS A CHILD  . Chronic heartburn    On omeprazole  . Complication of anesthesia    HARD TO WAKE UP  . Dyspnea    low hgb. and iron  chronic problem  . External hemorrhoids   . Frequency of urination   . Frequent loose stools   . GERD (gastroesophageal reflux disease)   . Heme positive stool   . History of hiatal hernia    large  . Hyperlipidemia   . Hypothyroidism   . Iron deficiency anemia   . Melanoma ([place])    left thigh s/p LN removal left groin  . Metastatic melanoma ([place])    Followed by Dr Oliva Bustard, previous chemo, no recurrence, 2008?  . Multinodular goiter   . Osteoporosis   . SCC (squamous cell carcinoma)    skin    PAST SURGICAL HISTORY: Past Surgical History:  Procedure Laterality Date  . ABDOMINAL HYSTERECTOMY  1984  . BACK SURGERY  03/2016   LUMBAR  . CARPAL TUNNEL RELEASE Right 09/07/2016   Procedure: CARPAL TUNNEL RELEASE;  Surgeon: Thornton Park, MD;  Location: [facility] ORS;  Service: Orthopedics;  Laterality: Right;  . CHOLECYSTECTOMY  1990  . COLONOSCOPY  08/2006   Four sessile polyps found and removed in sigmoid colon at splenic flexure and ascending colon. 7 mm in size. Another removed from transverse colon 4 mm. Path Report - showed tubular adenoma and hyperplastic  polyp. Advised to repeat in 3.5 years (02/2010)  . COLONOSCOPY  3.2.2011   8 mm polyp in sigmoid colon, removed, 4 mm polyp in descending colon, removed. Internal hemorrhoids  . ESOPHAGOGASTRODUODENOSCOPY  3.2.2011   Large hiatia hernia, esophagus normal, mulitple small sessile polyps w/no stigmata of recent bleeding found. Mildy erythermatous mucosa w/no bleeding found in gatric antrum. Normal duodenum. 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 PATH - no  active inflammation, antral mucosa w/mild foveolar hyperplasia  . FRACTURE SURGERY    . Lymph Node Removal  12/2005   Left inguinal lymph node dissection  . MELANOMA EXCISION Left 1993   Left thigh  . ORIF ANKLE FRACTURE Right    Dr. Mack Guise  . TOTAL HIP ARTHROPLASTY Right 11/18/2016   Procedure: TOTAL HIP ARTHROPLASTY;  Surgeon: Thornton Park, MD;  Location: [facility] ORS;  Service: Orthopedics;  Laterality: Right;  . TOTAL HIP ARTHROPLASTY     left    FAMILY HISTORY Family History  Problem Relation Age of Onset  . Aneurysm Mother   . Heart disease Mother   . Colon polyps Father   . Diabetes Father   . Dementia Father   . Stroke Father        TIA  . Macular degeneration Father   . Leukemia Maternal Grandfather   . Skin cancer Paternal Grandfather   . Diabetes Other   . Colon polyps Other   . Hypothyroidism Daughter   . Hypothyroidism Son   . Colon cancer Neg Hx   . Breast cancer Neg Hx   . Stomach cancer Neg Hx   . Rectal cancer Neg Hx   . Esophageal cancer Neg Hx     GYNECOLOGIC HISTORY:  No LMP recorded. Patient has had a hysterectomy.     ADVANCED DIRECTIVES:    HEALTH MAINTENANCE: Social History   Tobacco Use  . Smoking status: Former Smoker    Packs/day: 0.50    Years: 8.00    Pack years: 4.00    Types: Cigarettes    Quit date: 07/12/1978    Years since quitting: 42.3  . Smokeless tobacco: Never Used  Vaping Use  . Vaping Use: Never used  Substance Use Topics  . Alcohol use: No    Alcohol/week: 0.0 standard drinks  . Drug use: No    Allergies  Allergen Reactions  . Sulfa Antibiotics Rash    Current Outpatient Medications  Medication Sig Dispense Refill  . acetaminophen (TYLENOL) 650 MG CR tablet Take 1,300 mg by mouth daily.    . B Complex Vitamins (B COMPLEX PO) Take by mouth.    . Calcium Carbonate-Vitamin D 600-400 MG-UNIT tablet Take by mouth.    . celecoxib (CELEBREX) 200 MG capsule     . citalopram (CELEXA) 20 MG tablet Take 1 tablet  (20 mg total) by mouth daily. 90 tablet 3  . Ferrous Sulfate Dried 45 MG TBCR Take 45 mg by mouth daily with lunch.    . levothyroxine (SYNTHROID) 125 MCG tablet TAKE 1 TABLET DAILY BEFORE BREAKFAST 90 tablet 3  . loperamide (IMODIUM) 2 MG capsule Take 2 mg by mouth every morning.    . Multiple Vitamins-Minerals (EYE VITAMINS & MINERALS) TABS Take 1 tablet by mouth daily.    . NON FORMULARY Apply 1 application topically 2 (two) times daily as needed (muscle pain). Horse Liniment    . pravastatin (PRAVACHOL) 40 MG tablet Take 0.5 tablets (20 mg total) by mouth daily. 45 tablet 3  . Propylene  Glycol (SYSTANE BALANCE OP) Place 1 drop into both eyes daily as needed (dry eyes).     No current facility-administered medications for this visit.    OBJECTIVE: BP 113/72   Pulse 79   Temp 98.7 F (37.1 C)   Resp 20   Wt 202 lb 4.8 oz (91.8 kg)   SpO2 100%   BMI 37.00 kg/m    Body mass index is 37 kg/m.    ECOG FS:0 - Asymptomatic  General: Well-developed, well-nourished, no acute distress. Eyes: Pink conjunctiva, anicteric sclera. HEENT: Normocephalic, moist mucous membranes. Lungs: No audible wheezing or coughing. Heart: Regular rate and rhythm. Abdomen: Soft, nontender, no obvious distention. Musculoskeletal: No edema, cyanosis, or clubbing. Neuro: Alert, answering all questions appropriately. Cranial nerves grossly intact. Skin: No rashes or petechiae noted. Psych: Normal affect.   LAB RESULTS:  CBC    Component Value Date/Time   WBC 6.0 10/21/2020 1247   RBC 3.24 (L) 10/21/2020 1247   HGB 8.5 (L) 10/21/2020 1247   HGB 9.1 (L) 09/03/2020 0951   HCT 29.0 (L) 10/21/2020 1247   HCT 29.5 (L) 09/03/2020 0951   PLT 150 10/21/2020 1247   PLT 153 09/03/2020 0951   MCV 89.5 10/21/2020 1247   MCV 90 09/03/2020 0951   MCV 74 (L) 12/12/2013 1015   MCH 26.2 10/21/2020 1247   MCHC 29.3 (L) 10/21/2020 1247   RDW 15.3 10/21/2020 1247   RDW 14.5 09/03/2020 0951   RDW 33.6 (H)  12/12/2013 1015   LYMPHSABS 0.9 10/21/2020 1247   LYMPHSABS 1.0 09/03/2020 0951   LYMPHSABS 1.2 12/12/2013 1015   MONOABS 0.5 10/21/2020 1247   MONOABS 0.4 12/12/2013 1015   EOSABS 0.1 10/21/2020 1247   EOSABS 0.1 09/03/2020 0951   EOSABS 0.1 12/12/2013 1015   BASOSABS 0.0 10/21/2020 1247   BASOSABS 0.0 09/03/2020 0951   BASOSABS 0.1 12/12/2013 1015   Lab Results  Component Value Date   IRON 16 (L) 10/21/2020   TIBC 375 10/21/2020   IRONPCTSAT 4 (L) 10/21/2020   Lab Results  Component Value Date   FERRITIN 21 10/21/2020      STUDIES: No results found.  ASSESSMENT & PLAN:    1. Iron deficiency anemia: Patient's hemoglobin and iron stores have trended down and she is symptomatic.  Colonoscopy and EGD on May 02, 2020 did not reveal any distinct pathology.  Proceed with 510 mg IV Feraheme today.  Return to clinic in 1 week for second infusion.  Return to clinic in 3 months with repeat laboratory work, further evaluation, and continuation of treatment if needed.   2. History of melanoma: Melanoma of left thigh status post resection in 1993, exact stage is not known, metastatic to inguinal lymph node. No evidence of recurrent disease. 3.  Left hip pain: Patient underwent hip replacement surgery on May 04, 2020.     I spent a total of 30 minutes reviewing chart data, face-to-face evaluation with the patient, counseling and coordination of care as detailed above.   Patient expressed understanding and was in agreement with this plan. She also understands that She can call clinic at any time with any questions, concerns, or complaints.    Lloyd Huger, MD   10/22/2020 6:42 AM

## 2020-10-21 ENCOUNTER — Inpatient Hospital Stay: Payer: Medicare Other

## 2020-10-21 ENCOUNTER — Inpatient Hospital Stay (HOSPITAL_BASED_OUTPATIENT_CLINIC_OR_DEPARTMENT_OTHER): Payer: Medicare Other | Admitting: Oncology

## 2020-10-21 ENCOUNTER — Encounter: Payer: Self-pay | Admitting: Oncology

## 2020-10-21 ENCOUNTER — Inpatient Hospital Stay: Payer: Medicare Other | Attending: Oncology

## 2020-10-21 VITALS — BP 113/72 | HR 79 | Temp 98.7°F | Resp 20 | Wt 202.3 lb

## 2020-10-21 VITALS — BP 119/57 | HR 80 | Temp 98.1°F | Resp 18

## 2020-10-21 DIAGNOSIS — D509 Iron deficiency anemia, unspecified: Secondary | ICD-10-CM | POA: Insufficient documentation

## 2020-10-21 DIAGNOSIS — D508 Other iron deficiency anemias: Secondary | ICD-10-CM

## 2020-10-21 DIAGNOSIS — Z85820 Personal history of malignant melanoma of skin: Secondary | ICD-10-CM | POA: Insufficient documentation

## 2020-10-21 LAB — IRON AND TIBC
Iron: 16 ug/dL — ABNORMAL LOW (ref 28–170)
Saturation Ratios: 4 % — ABNORMAL LOW (ref 10.4–31.8)
TIBC: 375 ug/dL (ref 250–450)
UIBC: 359 ug/dL

## 2020-10-21 LAB — CBC WITH DIFFERENTIAL/PLATELET
Abs Immature Granulocytes: 0.02 10*3/uL (ref 0.00–0.07)
Basophils Absolute: 0 10*3/uL (ref 0.0–0.1)
Basophils Relative: 1 %
Eosinophils Absolute: 0.1 10*3/uL (ref 0.0–0.5)
Eosinophils Relative: 2 %
HCT: 29 % — ABNORMAL LOW (ref 36.0–46.0)
Hemoglobin: 8.5 g/dL — ABNORMAL LOW (ref 12.0–15.0)
Immature Granulocytes: 0 %
Lymphocytes Relative: 16 %
Lymphs Abs: 0.9 10*3/uL (ref 0.7–4.0)
MCH: 26.2 pg (ref 26.0–34.0)
MCHC: 29.3 g/dL — ABNORMAL LOW (ref 30.0–36.0)
MCV: 89.5 fL (ref 80.0–100.0)
Monocytes Absolute: 0.5 10*3/uL (ref 0.1–1.0)
Monocytes Relative: 8 %
Neutro Abs: 4.5 10*3/uL (ref 1.7–7.7)
Neutrophils Relative %: 73 %
Platelets: 150 10*3/uL (ref 150–400)
RBC: 3.24 MIL/uL — ABNORMAL LOW (ref 3.87–5.11)
RDW: 15.3 % (ref 11.5–15.5)
WBC: 6 10*3/uL (ref 4.0–10.5)
nRBC: 0 % (ref 0.0–0.2)

## 2020-10-21 LAB — FERRITIN: Ferritin: 21 ng/mL (ref 11–307)

## 2020-10-21 MED ORDER — SODIUM CHLORIDE 0.9 % IV SOLN
Freq: Once | INTRAVENOUS | Status: AC
  Filled 2020-10-21: qty 250

## 2020-10-21 MED ORDER — SODIUM CHLORIDE 0.9 % IV SOLN
510.0000 mg | Freq: Once | INTRAVENOUS | Status: AC
  Administered 2020-10-21: 510 mg via INTRAVENOUS
  Filled 2020-10-21: qty 510

## 2020-10-22 DIAGNOSIS — Z23 Encounter for immunization: Secondary | ICD-10-CM | POA: Diagnosis not present

## 2020-10-29 ENCOUNTER — Other Ambulatory Visit: Payer: Self-pay

## 2020-10-29 ENCOUNTER — Inpatient Hospital Stay: Payer: Medicare Other

## 2020-10-29 VITALS — BP 105/66 | HR 78 | Temp 98.2°F | Resp 18

## 2020-10-29 DIAGNOSIS — D508 Other iron deficiency anemias: Secondary | ICD-10-CM

## 2020-10-29 DIAGNOSIS — Z85820 Personal history of malignant melanoma of skin: Secondary | ICD-10-CM | POA: Diagnosis not present

## 2020-10-29 DIAGNOSIS — D509 Iron deficiency anemia, unspecified: Secondary | ICD-10-CM | POA: Diagnosis not present

## 2020-10-29 MED ORDER — SODIUM CHLORIDE 0.9 % IV SOLN
Freq: Once | INTRAVENOUS | Status: AC
  Filled 2020-10-29: qty 250

## 2020-10-29 MED ORDER — SODIUM CHLORIDE 0.9 % IV SOLN
510.0000 mg | Freq: Once | INTRAVENOUS | Status: AC
  Administered 2020-10-29: 510 mg via INTRAVENOUS
  Filled 2020-10-29: qty 17

## 2020-11-03 DIAGNOSIS — C44311 Basal cell carcinoma of skin of nose: Secondary | ICD-10-CM | POA: Diagnosis not present

## 2020-11-03 DIAGNOSIS — L988 Other specified disorders of the skin and subcutaneous tissue: Secondary | ICD-10-CM | POA: Diagnosis not present

## 2020-11-03 DIAGNOSIS — L578 Other skin changes due to chronic exposure to nonionizing radiation: Secondary | ICD-10-CM | POA: Diagnosis not present

## 2020-11-03 DIAGNOSIS — L814 Other melanin hyperpigmentation: Secondary | ICD-10-CM | POA: Diagnosis not present

## 2021-01-14 DIAGNOSIS — Z20822 Contact with and (suspected) exposure to covid-19: Secondary | ICD-10-CM | POA: Diagnosis not present

## 2021-01-23 ENCOUNTER — Inpatient Hospital Stay: Payer: Medicare Other | Attending: Oncology

## 2021-01-23 ENCOUNTER — Other Ambulatory Visit: Payer: Self-pay

## 2021-01-23 DIAGNOSIS — D509 Iron deficiency anemia, unspecified: Secondary | ICD-10-CM | POA: Insufficient documentation

## 2021-01-23 LAB — IRON AND TIBC
Iron: 21 ug/dL — ABNORMAL LOW (ref 28–170)
Saturation Ratios: 6 % — ABNORMAL LOW (ref 10.4–31.8)
TIBC: 384 ug/dL (ref 250–450)
UIBC: 363 ug/dL

## 2021-01-23 LAB — CBC WITH DIFFERENTIAL/PLATELET
Abs Immature Granulocytes: 0.02 10*3/uL (ref 0.00–0.07)
Basophils Absolute: 0 10*3/uL (ref 0.0–0.1)
Basophils Relative: 0 %
Eosinophils Absolute: 0.2 10*3/uL (ref 0.0–0.5)
Eosinophils Relative: 3 %
HCT: 32.2 % — ABNORMAL LOW (ref 36.0–46.0)
Hemoglobin: 9.1 g/dL — ABNORMAL LOW (ref 12.0–15.0)
Immature Granulocytes: 0 %
Lymphocytes Relative: 19 %
Lymphs Abs: 1 10*3/uL (ref 0.7–4.0)
MCH: 26.1 pg (ref 26.0–34.0)
MCHC: 28.3 g/dL — ABNORMAL LOW (ref 30.0–36.0)
MCV: 92.5 fL (ref 80.0–100.0)
Monocytes Absolute: 0.4 10*3/uL (ref 0.1–1.0)
Monocytes Relative: 7 %
Neutro Abs: 3.8 10*3/uL (ref 1.7–7.7)
Neutrophils Relative %: 71 %
Platelets: 155 10*3/uL (ref 150–400)
RBC: 3.48 MIL/uL — ABNORMAL LOW (ref 3.87–5.11)
RDW: 14.6 % (ref 11.5–15.5)
WBC: 5.5 10*3/uL (ref 4.0–10.5)
nRBC: 0 % (ref 0.0–0.2)

## 2021-01-23 LAB — FERRITIN: Ferritin: 16 ng/mL (ref 11–307)

## 2021-01-26 ENCOUNTER — Inpatient Hospital Stay (HOSPITAL_BASED_OUTPATIENT_CLINIC_OR_DEPARTMENT_OTHER): Payer: Medicare Other | Admitting: Oncology

## 2021-01-26 ENCOUNTER — Encounter: Payer: Self-pay | Admitting: Oncology

## 2021-01-26 ENCOUNTER — Inpatient Hospital Stay: Payer: Medicare Other

## 2021-01-26 ENCOUNTER — Other Ambulatory Visit: Payer: Self-pay

## 2021-01-26 VITALS — BP 128/68 | HR 77 | Temp 97.6°F | Resp 19 | Wt 190.0 lb

## 2021-01-26 DIAGNOSIS — D508 Other iron deficiency anemias: Secondary | ICD-10-CM

## 2021-01-26 DIAGNOSIS — D509 Iron deficiency anemia, unspecified: Secondary | ICD-10-CM | POA: Diagnosis not present

## 2021-01-26 MED ORDER — SODIUM CHLORIDE 0.9 % IV SOLN
INTRAVENOUS | Status: DC | PRN
  Filled 2021-01-26: qty 250

## 2021-01-26 MED ORDER — SODIUM CHLORIDE 0.9 % IV SOLN
510.0000 mg | Freq: Once | INTRAVENOUS | Status: AC
  Administered 2021-01-26: 510 mg via INTRAVENOUS
  Filled 2021-01-26: qty 510

## 2021-01-26 NOTE — Progress Notes (Signed)
Patient here for oncology follow-up appointment, concerns of SOB with exertion    

## 2021-01-26 NOTE — Progress Notes (Signed)
[place]  Telephone:(336) [phone]  Fax:(336) [phone]     Janet Rice DOB: 1945/08/08  MR#: [id number]  MOQ#:[id number]  Patient Care Team: McLean-Scocuzza, Nino Glow, MD as PCP - General (Internal Medicine) Lloyd Huger, MD as Consulting Physician (Hematology and Oncology)   CHIEF COMPLAINT: Iron deficiency anemia.  INTERVAL HISTORY: Patient returns to clinic today for repeat laboratory work, further evaluation, and continuation of IV Feraheme if necessary.  She last received IV Feraheme approximately 3 months ago.  In the interim, she denies any bleeding.  Reports increased weakness and fatigue over the past several weeks.  Reports improvement of her symptoms for about 2 months post iron infusion.  Otherwise, she has been doing well.  REVIEW OF SYSTEMS:   Review of Systems  Constitutional:  Positive for malaise/fatigue. Negative for fever and weight loss.  Eyes: Negative.   Respiratory: Negative.  Negative for cough and shortness of breath.   Cardiovascular: Negative.  Negative for chest pain and leg swelling.  Gastrointestinal: Negative.  Negative for abdominal pain, blood in stool and melena.  Genitourinary: Negative.  Negative for hematuria.  Musculoskeletal: Negative.  Negative for back pain and joint pain.  Skin: Negative.  Negative for rash.  Neurological:  Positive for weakness. Negative for dizziness, sensory change and focal weakness.  Psychiatric/Behavioral: Negative.  The patient is not nervous/anxious.    As per HPI. Otherwise, a complete review of systems is negative.  ONCOLOGY HISTORY: Oncology History   No history exists.    PAST MEDICAL HISTORY: Past Medical History:  Diagnosis Date   Adenomatous colon polyp    Arthritis    Basal cell carcinoma    left supratip of nose nodular edges involved 08/27/20 Dr. Kellie Moor, left postauricular sulcus nodular bcc 01/02/20 exc 01/23/20 neg margins   Blood transfusion without reported  diagnosis    Cataract    Childhood asthma    AS A CHILD   Chronic heartburn    On omeprazole   Complication of anesthesia    HARD TO WAKE UP   Dyspnea    low hgb. and iron  chronic problem   External hemorrhoids    Frequency of urination    Frequent loose stools    GERD (gastroesophageal reflux disease)    Heme positive stool    History of hiatal hernia    large   Hyperlipidemia    Hypothyroidism    Iron deficiency anemia    Melanoma ([facility])    left thigh s/p LN removal left groin   Metastatic melanoma ([place])    Followed by Dr Oliva Bustard, previous chemo, no recurrence, 2008?   Multinodular goiter    Osteoporosis    SCC (squamous cell carcinoma)    skin    PAST SURGICAL HISTORY: Past Surgical History:  Procedure Laterality Date   ABDOMINAL HYSTERECTOMY  1984   BACK SURGERY  03/2016   LUMBAR   CARPAL TUNNEL RELEASE Right 09/07/2016   Procedure: CARPAL TUNNEL RELEASE;  Surgeon: Thornton Park, MD;  Location: [facility] ORS;  Service: Orthopedics;  Laterality: Right;   CHOLECYSTECTOMY  1990   COLONOSCOPY  08/2006   Four sessile polyps found and removed in sigmoid colon at splenic flexure and ascending colon. 7 mm in size. Another removed from transverse colon 4 mm. Path Report - showed tubular adenoma and hyperplastic polyp. Advised to repeat in 3.5 years (02/2010)   COLONOSCOPY  3.2.2011   8 mm polyp in sigmoid colon, removed, 4 mm polyp in descending colon, removed.  Internal hemorrhoids   ESOPHAGOGASTRODUODENOSCOPY  3.2.2011   Large hiatia hernia, esophagus normal, mulitple small sessile polyps w/no stigmata of recent bleeding found. Mildy erythermatous mucosa w/no bleeding found in gatric antrum. Normal duodenum. 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 PATH - no active inflammation, antral mucosa w/mild foveolar hyperplasia   FRACTURE SURGERY     Lymph Node Removal  12/2005   Left inguinal lymph node dissection   MELANOMA EXCISION Left 1993   Left thigh   ORIF ANKLE  FRACTURE Right    Dr. Mack Guise   TOTAL HIP ARTHROPLASTY Right 11/18/2016   Procedure: TOTAL HIP ARTHROPLASTY;  Surgeon: Thornton Park, MD;  Location: [facility] ORS;  Service: Orthopedics;  Laterality: Right;   TOTAL HIP ARTHROPLASTY     left    FAMILY HISTORY Family History  Problem Relation Age of Onset   Aneurysm Mother    Heart disease Mother    Colon polyps Father    Diabetes Father    Dementia Father    Stroke Father        TIA   Macular degeneration Father    Leukemia Maternal Grandfather    Skin cancer Paternal Grandfather    Diabetes Other    Colon polyps Other    Hypothyroidism Daughter    Hypothyroidism Son    Colon cancer Neg Hx    Breast cancer Neg Hx    Stomach cancer Neg Hx    Rectal cancer Neg Hx    Esophageal cancer Neg Hx     GYNECOLOGIC HISTORY:  No LMP recorded. Patient has had a hysterectomy.     ADVANCED DIRECTIVES:    HEALTH MAINTENANCE: Social History   Tobacco Use   Smoking status: Former    Packs/day: 0.50    Years: 8.00    Pack years: 4.00    Types: Cigarettes    Quit date: 07/12/1978    Years since quitting: 42.5   Smokeless tobacco: Never  Vaping Use   Vaping Use: Never used  Substance Use Topics   Alcohol use: No    Alcohol/week: 0.0 standard drinks   Drug use: No    Allergies  Allergen Reactions   Sulfa Antibiotics Rash    Current Outpatient Medications  Medication Sig Dispense Refill   acetaminophen (TYLENOL) 650 MG CR tablet Take 1,300 mg by mouth daily.     B Complex Vitamins (B COMPLEX PO) Take by mouth.     Calcium Carbonate-Vitamin D 600-400 MG-UNIT tablet Take by mouth.     celecoxib (CELEBREX) 200 MG capsule      citalopram (CELEXA) 20 MG tablet Take 1 tablet (20 mg total) by mouth daily. 90 tablet 3   Ferrous Sulfate Dried 45 MG TBCR Take 45 mg by mouth daily with lunch.     levothyroxine (SYNTHROID) 125 MCG tablet TAKE 1 TABLET DAILY BEFORE BREAKFAST 90 tablet 3   loperamide (IMODIUM) 2 MG capsule Take 2 mg by  mouth every morning.     Multiple Vitamins-Minerals (EYE VITAMINS & MINERALS) TABS Take 1 tablet by mouth daily.     NON FORMULARY Apply 1 application topically 2 (two) times daily as needed (muscle pain). Horse Liniment     pravastatin (PRAVACHOL) 40 MG tablet Take 0.5 tablets (20 mg total) by mouth daily. 45 tablet 3   Propylene Glycol (SYSTANE BALANCE OP) Place 1 drop into both eyes daily as needed (dry eyes).     No current facility-administered medications for this visit.    OBJECTIVE: BP  128/68 (Patient Position: Sitting)   Pulse 77   Temp 97.6 F (36.4 C) (Tympanic)   Resp 19   Wt 190 lb (86.2 kg)   SpO2 95%   BMI 34.75 kg/m    Body mass index is 34.75 kg/m.    ECOG FS:0 - Asymptomatic  Physical Exam Constitutional:      Appearance: Normal appearance.  HENT:     Head: Normocephalic and atraumatic.  Eyes:     Pupils: Pupils are equal, round, and reactive to light.  Cardiovascular:     Rate and Rhythm: Normal rate and regular rhythm.     Heart sounds: Normal heart sounds. No murmur heard. Pulmonary:     Effort: Pulmonary effort is normal.     Breath sounds: Normal breath sounds. No wheezing.  Abdominal:     General: Bowel sounds are normal. There is no distension.     Palpations: Abdomen is soft.     Tenderness: There is no abdominal tenderness.  Musculoskeletal:        General: Normal range of motion.     Cervical back: Normal range of motion.  Skin:    General: Skin is warm and dry.     Findings: No rash.  Neurological:     Mental Status: She is alert and oriented to person, place, and time.  Psychiatric:        Judgment: Judgment normal.      LAB RESULTS:  CBC    Component Value Date/Time   WBC 5.5 01/23/2021 1328   RBC 3.48 (L) 01/23/2021 1328   HGB 9.1 (L) 01/23/2021 1328   HGB 9.1 (L) 09/03/2020 0951   HCT 32.2 (L) 01/23/2021 1328   HCT 29.5 (L) 09/03/2020 0951   PLT 155 01/23/2021 1328   PLT 153 09/03/2020 0951   MCV 92.5 01/23/2021 1328    MCV 90 09/03/2020 0951   MCV 74 (L) 12/12/2013 1015   MCH 26.1 01/23/2021 1328   MCHC 28.3 (L) 01/23/2021 1328   RDW 14.6 01/23/2021 1328   RDW 14.5 09/03/2020 0951   RDW 33.6 (H) 12/12/2013 1015   LYMPHSABS 1.0 01/23/2021 1328   LYMPHSABS 1.0 09/03/2020 0951   LYMPHSABS 1.2 12/12/2013 1015   MONOABS 0.4 01/23/2021 1328   MONOABS 0.4 12/12/2013 1015   EOSABS 0.2 01/23/2021 1328   EOSABS 0.1 09/03/2020 0951   EOSABS 0.1 12/12/2013 1015   BASOSABS 0.0 01/23/2021 1328   BASOSABS 0.0 09/03/2020 0951   BASOSABS 0.1 12/12/2013 1015   Lab Results  Component Value Date   IRON 21 (L) 01/23/2021   TIBC 384 01/23/2021   IRONPCTSAT 6 (L) 01/23/2021   Lab Results  Component Value Date   FERRITIN 16 01/23/2021      STUDIES: No results found.  ASSESSMENT & PLAN:    1. Iron deficiency anemia:  Likely secondary to large hiatal hernia and recurrent Cameron's erosion. She is followed by Dr. Hilarie Fredrickson.  Colonoscopy/EGD was in October 2021 revealing above. She received 2 doses of IV Feraheme approximately every 3 months. She last received IV Feraheme on 10/21/2020 and 10/29/2020. Denies any obvious bleeding. Proceed with IV Feraheme today and return to clinic in 1 week for second infusion. Return to clinic in 10 weeks for repeat lab work (ferritin, CBC and iron panel), MD assessment and IV Feraheme.   2. History of melanoma:  Melanoma of left thigh status post resection in 1993, exact stage is not known, metastatic to inguinal lymph node.  No evidence of recurrent  disease.  3.  Left hip pain:  Patient underwent hip replacement surgery on May 04, 2020.    Disposition: IV feraheme today.  RTC in 1 week for second dose.  RTC in 10 weeks for labs, MD assessment and possible IV fereheme.   Greater than 50% was spent in counseling and coordination of care with this patient including but not limited to discussion of the relevant topics above (See A&P) including, but not limited to  diagnosis and management of acute and chronic medical conditions.   Patient expressed understanding and was in agreement with this plan. She also understands that She can call clinic at any time with any questions, concerns, or complaints.    Jacquelin Hawking, NP   01/26/2021 2:21 PM

## 2021-02-02 ENCOUNTER — Inpatient Hospital Stay: Payer: Medicare Other

## 2021-02-02 ENCOUNTER — Other Ambulatory Visit: Payer: Self-pay

## 2021-02-02 VITALS — BP 153/62 | HR 80 | Temp 96.2°F | Resp 18

## 2021-02-02 DIAGNOSIS — D509 Iron deficiency anemia, unspecified: Secondary | ICD-10-CM | POA: Diagnosis not present

## 2021-02-02 DIAGNOSIS — D508 Other iron deficiency anemias: Secondary | ICD-10-CM

## 2021-02-02 MED ORDER — SODIUM CHLORIDE 0.9 % IV SOLN
510.0000 mg | Freq: Once | INTRAVENOUS | Status: AC
  Administered 2021-02-02: 510 mg via INTRAVENOUS
  Filled 2021-02-02: qty 17

## 2021-02-02 NOTE — Patient Instructions (Signed)
[facility]  Discharge Instructions: Thank you for choosing [facility] to provide your oncology and hematology care.  If you have a lab appointment with the [facility], please go directly to the [facility] and check in at the registration area.  Wear comfortable clothing and clothing appropriate for easy access to any Portacath or PICC line.   We strive to give you quality time with your provider. You may need to reschedule your appointment if you arrive late (15 or more minutes).  Arriving late affects you and other patients whose appointments are after yours.  Also, if you miss three or more appointments without notifying the office, you may be dismissed from the clinic at the provider's discretion.      For prescription refill requests, have your pharmacy contact our office and allow 72 hours for refills to be completed.    Today you received the following chemotherapy and/or immunotherapy agents feraheme       To help prevent nausea and vomiting after your treatment, we encourage you to take your nausea medication as directed.  BELOW ARE SYMPTOMS THAT SHOULD BE REPORTED IMMEDIATELY: *FEVER GREATER THAN 100.4 F (38 C) OR HIGHER *CHILLS OR SWEATING *NAUSEA AND VOMITING THAT IS NOT CONTROLLED WITH YOUR NAUSEA MEDICATION *UNUSUAL SHORTNESS OF BREATH *UNUSUAL BRUISING OR BLEEDING *URINARY PROBLEMS (pain or burning when urinating, or frequent urination) *BOWEL PROBLEMS (unusual diarrhea, constipation, pain near the anus) TENDERNESS IN MOUTH AND THROAT WITH OR WITHOUT PRESENCE OF ULCERS (sore throat, sores in mouth, or a toothache) UNUSUAL RASH, SWELLING OR PAIN  UNUSUAL VAGINAL DISCHARGE OR ITCHING   Items with * indicate a potential emergency and should be followed up as soon as possible or go to the Emergency Department if any problems should occur.  Please show the CHEMOTHERAPY ALERT CARD or IMMUNOTHERAPY ALERT CARD at check-in  to the Emergency Department and triage nurse.  Should you have questions after your visit or need to cancel or reschedule your appointment, please contact [facility]  [phone] and follow the prompts.  Office hours are 8:00 a.m. to 4:30 p.m. Monday - Friday. Please note that voicemails left after 4:00 p.m. may not be returned until the following business day.  We are closed weekends and major holidays. You have access to a nurse at all times for urgent questions. Please call the main number to the clinic [phone] and follow the prompts.  For any non-urgent questions, you may also contact your provider using MyChart. We now offer e-Visits for anyone 92 and older to request care online for non-urgent symptoms. For details visit [email or web address].   Also download the MyChart app! Go to the app store, search "MyChart", open the app, select [facility], and log in with your MyChart username and password.  Due to Covid, a mask is required upon entering the hospital/clinic. If you do not have a mask, one will be given to you upon arrival. For doctor visits, patients may have 1 support person aged 55 or older with them. For treatment visits, patients cannot have anyone with them due to current Covid guidelines and our immunocompromised population.   Ferumoxytol injection What is this medication? FERUMOXYTOL is an iron complex. Iron is used to make healthy red blood cells, which carry oxygen and nutrients throughout the body. This medicine is used totreat iron deficiency anemia. This medicine may be used for other purposes; ask your health care provider orpharmacist if you have questions.  COMMON BRAND NAME(S): Feraheme What should I tell my care team before I take this medication? They need to know if you have any of these conditions: anemia not caused by low iron levels high levels of iron in the blood magnetic resonance imaging (MRI) test scheduled an  unusual or allergic reaction to iron, other medicines, foods, dyes, or preservatives pregnant or trying to get pregnant breast-feeding How should I use this medication? This medicine is for injection into a vein. It is given by a health careprofessional in a hospital or clinic setting. Talk to your pediatrician regarding the use of this medicine in children.Special care may be needed. Overdosage: If you think you have taken too much of this medicine contact apoison control center or emergency room at once. NOTE: This medicine is only for you. Do not share this medicine with others. What if I miss a dose? It is important not to miss your dose. Call your doctor or health careprofessional if you are unable to keep an appointment. What may interact with this medication? This medicine may interact with the following medications: other iron products This list may not describe all possible interactions. Give your health care provider a list of all the medicines, herbs, non-prescription drugs, or dietary supplements you use. Also tell them if you smoke, drink alcohol, or use illegaldrugs. Some items may interact with your medicine. What should I watch for while using this medication? Visit your doctor or healthcare professional regularly. Tell your doctor or healthcare professional if your symptoms do not start to get better or if theyget worse. You may need blood work done while you are taking this medicine. You may need to follow a special diet. Talk to your doctor. Foods that contain iron include: whole grains/cereals, dried fruits, beans, or peas, leafy greenvegetables, and organ meats (liver, kidney). What side effects may I notice from receiving this medication? Side effects that you should report to your doctor or health care professionalas soon as possible: allergic reactions like skin rash, itching or hives, swelling of the face, lips, or tongue breathing problems changes in blood  pressure feeling faint or lightheaded, falls fever or chills flushing, sweating, or hot feelings swelling of the ankles or feet Side effects that usually do not require medical attention (report to yourdoctor or health care professional if they continue or are bothersome): diarrhea headache nausea, vomiting stomach pain This list may not describe all possible side effects. Call your doctor for medical advice about side effects. You may report side effects to FDA at1-800-FDA-1088. Where should I keep my medication? This drug is given in a hospital or clinic and will not be stored at home. NOTE: This sheet is a summary. It may not cover all possible information. If you have questions about this medicine, talk to your doctor, pharmacist, orhealth care provider.  2022 Elsevier/Gold Standard (2016-08-16 20:21:10)

## 2021-02-04 DIAGNOSIS — Z961 Presence of intraocular lens: Secondary | ICD-10-CM | POA: Diagnosis not present

## 2021-02-04 DIAGNOSIS — H3581 Retinal edema: Secondary | ICD-10-CM | POA: Diagnosis not present

## 2021-02-04 DIAGNOSIS — H5213 Myopia, bilateral: Secondary | ICD-10-CM | POA: Diagnosis not present

## 2021-03-06 ENCOUNTER — Other Ambulatory Visit: Payer: Self-pay | Admitting: Neurosurgery

## 2021-03-06 DIAGNOSIS — G8929 Other chronic pain: Secondary | ICD-10-CM | POA: Diagnosis not present

## 2021-03-06 DIAGNOSIS — M48062 Spinal stenosis, lumbar region with neurogenic claudication: Secondary | ICD-10-CM | POA: Diagnosis not present

## 2021-03-06 DIAGNOSIS — Z6839 Body mass index (BMI) 39.0-39.9, adult: Secondary | ICD-10-CM | POA: Diagnosis not present

## 2021-03-06 DIAGNOSIS — R030 Elevated blood-pressure reading, without diagnosis of hypertension: Secondary | ICD-10-CM | POA: Diagnosis not present

## 2021-03-06 DIAGNOSIS — M545 Low back pain, unspecified: Secondary | ICD-10-CM | POA: Diagnosis not present

## 2021-03-07 ENCOUNTER — Encounter: Payer: Self-pay | Admitting: Oncology

## 2021-03-07 DIAGNOSIS — D509 Iron deficiency anemia, unspecified: Secondary | ICD-10-CM

## 2021-03-10 ENCOUNTER — Inpatient Hospital Stay: Payer: Medicare Other | Attending: Oncology

## 2021-03-10 DIAGNOSIS — D509 Iron deficiency anemia, unspecified: Secondary | ICD-10-CM | POA: Diagnosis not present

## 2021-03-10 LAB — CBC WITH DIFFERENTIAL/PLATELET
Abs Immature Granulocytes: 0.02 10*3/uL (ref 0.00–0.07)
Basophils Absolute: 0 10*3/uL (ref 0.0–0.1)
Basophils Relative: 1 %
Eosinophils Absolute: 0.1 10*3/uL (ref 0.0–0.5)
Eosinophils Relative: 2 %
HCT: 26.2 % — ABNORMAL LOW (ref 36.0–46.0)
Hemoglobin: 7.7 g/dL — ABNORMAL LOW (ref 12.0–15.0)
Immature Granulocytes: 0 %
Lymphocytes Relative: 13 %
Lymphs Abs: 0.7 10*3/uL (ref 0.7–4.0)
MCH: 28.2 pg (ref 26.0–34.0)
MCHC: 29.4 g/dL — ABNORMAL LOW (ref 30.0–36.0)
MCV: 96 fL (ref 80.0–100.0)
Monocytes Absolute: 0.4 10*3/uL (ref 0.1–1.0)
Monocytes Relative: 7 %
Neutro Abs: 4.5 10*3/uL (ref 1.7–7.7)
Neutrophils Relative %: 77 %
Platelets: 151 10*3/uL (ref 150–400)
RBC: 2.73 MIL/uL — ABNORMAL LOW (ref 3.87–5.11)
RDW: 17 % — ABNORMAL HIGH (ref 11.5–15.5)
WBC: 5.8 10*3/uL (ref 4.0–10.5)
nRBC: 0 % (ref 0.0–0.2)

## 2021-03-10 LAB — IRON AND TIBC
Iron: 20 ug/dL — ABNORMAL LOW (ref 28–170)
Saturation Ratios: 6 % — ABNORMAL LOW (ref 10.4–31.8)
TIBC: 363 ug/dL (ref 250–450)
UIBC: 343 ug/dL

## 2021-03-10 LAB — SAMPLE TO BLOOD BANK

## 2021-03-10 LAB — FERRITIN: Ferritin: 23 ng/mL (ref 11–307)

## 2021-03-12 ENCOUNTER — Encounter: Payer: Self-pay | Admitting: Oncology

## 2021-03-13 ENCOUNTER — Inpatient Hospital Stay: Payer: Medicare Other | Attending: Oncology

## 2021-03-13 ENCOUNTER — Other Ambulatory Visit: Payer: Self-pay

## 2021-03-13 VITALS — BP 137/71 | HR 78 | Temp 96.9°F | Resp 18

## 2021-03-13 DIAGNOSIS — D509 Iron deficiency anemia, unspecified: Secondary | ICD-10-CM | POA: Insufficient documentation

## 2021-03-13 DIAGNOSIS — D508 Other iron deficiency anemias: Secondary | ICD-10-CM

## 2021-03-13 MED ORDER — SODIUM CHLORIDE 0.9 % IV SOLN
510.0000 mg | Freq: Once | INTRAVENOUS | Status: AC
  Administered 2021-03-13: 510 mg via INTRAVENOUS
  Filled 2021-03-13: qty 17

## 2021-03-13 MED ORDER — SODIUM CHLORIDE 0.9 % IV SOLN
INTRAVENOUS | Status: AC | PRN
  Administered 2021-03-13: 250 mL via INTRAVENOUS
  Filled 2021-03-13: qty 250

## 2021-03-13 NOTE — Patient Instructions (Signed)

## 2021-03-20 ENCOUNTER — Other Ambulatory Visit: Payer: Self-pay

## 2021-03-20 ENCOUNTER — Inpatient Hospital Stay: Payer: Medicare Other

## 2021-03-20 VITALS — BP 98/65 | HR 78 | Temp 97.2°F | Resp 18

## 2021-03-20 DIAGNOSIS — D508 Other iron deficiency anemias: Secondary | ICD-10-CM

## 2021-03-20 DIAGNOSIS — D509 Iron deficiency anemia, unspecified: Secondary | ICD-10-CM | POA: Diagnosis not present

## 2021-03-20 MED ORDER — SODIUM CHLORIDE 0.9 % IV SOLN
510.0000 mg | Freq: Once | INTRAVENOUS | Status: AC
  Administered 2021-03-20: 510 mg via INTRAVENOUS
  Filled 2021-03-20: qty 17

## 2021-03-20 MED ORDER — SODIUM CHLORIDE 0.9 % IV SOLN
INTRAVENOUS | Status: DC | PRN
  Filled 2021-03-20: qty 250

## 2021-03-30 ENCOUNTER — Encounter: Payer: Self-pay | Admitting: Oncology

## 2021-04-03 DIAGNOSIS — R300 Dysuria: Secondary | ICD-10-CM | POA: Diagnosis not present

## 2021-04-13 DIAGNOSIS — Z96642 Presence of left artificial hip joint: Secondary | ICD-10-CM | POA: Diagnosis not present

## 2021-04-21 ENCOUNTER — Other Ambulatory Visit: Payer: Medicare Other

## 2021-04-21 ENCOUNTER — Ambulatory Visit: Payer: Medicare Other

## 2021-04-21 ENCOUNTER — Ambulatory Visit: Payer: Medicare Other | Admitting: Oncology

## 2021-04-23 DIAGNOSIS — Z23 Encounter for immunization: Secondary | ICD-10-CM | POA: Diagnosis not present

## 2021-04-27 ENCOUNTER — Ambulatory Visit: Payer: Medicare Other

## 2021-05-01 ENCOUNTER — Ambulatory Visit: Payer: Medicare Other

## 2021-05-05 ENCOUNTER — Ambulatory Visit: Payer: Medicare Other | Admitting: Internal Medicine

## 2021-05-12 ENCOUNTER — Ambulatory Visit: Payer: Medicare Other | Admitting: Oncology

## 2021-05-12 ENCOUNTER — Ambulatory Visit: Payer: Medicare Other

## 2021-05-12 ENCOUNTER — Other Ambulatory Visit: Payer: Medicare Other

## 2021-05-14 NOTE — Progress Notes (Signed)
[facility]  [phone]  [phone]     Janet Rice DOB: 1945/09/10  MR#: [id number]  VOP#:[id number]  Patient Care Team: McLean-Scocuzza, Nino Glow, MD as PCP - General (Internal Medicine) Lloyd Huger, MD as Consulting Physician (Hematology and Oncology)   CHIEF COMPLAINT: Iron deficiency anemia.  INTERVAL HISTORY: Patient returns to clinic today for repeat laboratory work, further evaluation, and consideration of additional IV Feraheme.  She continues to have chronic weakness and fatigue, but noted significant improvement after receiving IV iron recently.  She otherwise feels well.  She has no neurologic complaints. She denies any recent fevers or illnesses. She has a good appetite and denies weight loss.  She denies any chest pain, shortness of breath, cough, or hemoptysis.  She denies any nausea, vomiting, constipation, or diarrhea. She has no melena or hematochezia. She has no urinary complaints.  Patient offers no further specific complaints today.  REVIEW OF SYSTEMS:   Review of Systems  Constitutional:  Positive for malaise/fatigue. Negative for fever and weight loss.  Eyes: Negative.   Respiratory: Negative.  Negative for cough and shortness of breath.   Cardiovascular: Negative.  Negative for chest pain and leg swelling.  Gastrointestinal: Negative.  Negative for abdominal pain, blood in stool and melena.  Genitourinary: Negative.  Negative for hematuria.  Musculoskeletal: Negative.  Negative for back pain and joint pain.  Skin: Negative.  Negative for rash.  Neurological:  Positive for weakness. Negative for dizziness, sensory change and focal weakness.  Psychiatric/Behavioral: Negative.  The patient is not nervous/anxious.    As per HPI. Otherwise, a complete review of systems is negative.  ONCOLOGY HISTORY: Oncology History   No history exists.    PAST MEDICAL HISTORY: Past Medical History:  Diagnosis Date    Adenomatous colon polyp    Arthritis    Basal cell carcinoma    left supratip of nose nodular edges involved 08/27/20 Dr. Kellie Moor, left postauricular sulcus nodular bcc 01/02/20 exc 01/23/20 neg margins   Blood transfusion without reported diagnosis    Cataract    Childhood asthma    AS A CHILD   Chronic heartburn    On omeprazole   Complication of anesthesia    HARD TO WAKE UP   Dyspnea    low hgb. and iron  chronic problem   External hemorrhoids    Frequency of urination    Frequent loose stools    GERD (gastroesophageal reflux disease)    Heme positive stool    History of hiatal hernia    large   Hyperlipidemia    Hypothyroidism    Iron deficiency anemia    Melanoma (HCC)    left thigh s/p LN removal left groin   Metastatic melanoma ([place])    Followed by Dr Oliva Bustard, previous chemo, no recurrence, 2008?   Multinodular goiter    Osteoporosis    SCC (squamous cell carcinoma)    skin    PAST SURGICAL HISTORY: Past Surgical History:  Procedure Laterality Date   ABDOMINAL HYSTERECTOMY  1984   BACK SURGERY  03/2016   LUMBAR   CARPAL TUNNEL RELEASE Right 09/07/2016   Procedure: CARPAL TUNNEL RELEASE;  Surgeon: Thornton Park, MD;  Location: [facility] ORS;  Service: Orthopedics;  Laterality: Right;   CHOLECYSTECTOMY  1990   COLONOSCOPY  08/2006   Four sessile polyps found and removed in sigmoid colon at splenic flexure and ascending colon. 7 mm in size. Another removed from transverse colon 4 mm. Path Report -  showed tubular adenoma and hyperplastic polyp. Advised to repeat in 3.5 years (02/2010)   COLONOSCOPY  3.2.2011   8 mm polyp in sigmoid colon, removed, 4 mm polyp in descending colon, removed. Internal hemorrhoids   ESOPHAGOGASTRODUODENOSCOPY  3.2.2011   Large hiatia hernia, esophagus normal, mulitple small sessile polyps w/no stigmata of recent bleeding found. Mildy erythermatous mucosa w/no bleeding found in gatric antrum. Normal duodenum. Bx done of gastric mucoal  abnormalitiy and duodeunum. PATH - no active inflammation, antral mucosa w/mild foveolar hyperplasia   FRACTURE SURGERY     Lymph Node Removal  12/2005   Left inguinal lymph node dissection   MELANOMA EXCISION Left 1993   Left thigh   ORIF ANKLE FRACTURE Right    Dr. Mack Guise   TOTAL HIP ARTHROPLASTY Right 11/18/2016   Procedure: TOTAL HIP ARTHROPLASTY;  Surgeon: Thornton Park, MD;  Location: [facility] ORS;  Service: Orthopedics;  Laterality: Right;   TOTAL HIP ARTHROPLASTY     left    FAMILY HISTORY Family History  Problem Relation Age of Onset   Aneurysm Mother    Heart disease Mother    Colon polyps Father    Diabetes Father    Dementia Father    Stroke Father        TIA   Macular degeneration Father    Leukemia Maternal Grandfather    Skin cancer Paternal Grandfather    Diabetes Other    Colon polyps Other    Hypothyroidism Daughter    Hypothyroidism Son    Colon cancer Neg Hx    Breast cancer Neg Hx    Stomach cancer Neg Hx    Rectal cancer Neg Hx    Esophageal cancer Neg Hx     GYNECOLOGIC HISTORY:  No LMP recorded. Patient has had a hysterectomy.     ADVANCED DIRECTIVES:    HEALTH MAINTENANCE: Social History   Tobacco Use   Smoking status: Former    Packs/day: 0.50    Years: 8.00    Pack years: 4.00    Types: Cigarettes    Quit date: 07/12/1978    Years since quitting: 42.8   Smokeless tobacco: Never  Vaping Use   Vaping Use: Never used  Substance Use Topics   Alcohol use: No    Alcohol/week: 0.0 standard drinks   Drug use: No    Allergies  Allergen Reactions   Sulfa Antibiotics Rash    Current Outpatient Medications  Medication Sig Dispense Refill   acetaminophen (TYLENOL) 650 MG CR tablet Take 1,300 mg by mouth daily.     B Complex Vitamins (B COMPLEX PO) Take by mouth.     Calcium Carbonate-Vitamin D 600-400 MG-UNIT tablet Take by mouth.     celecoxib (CELEBREX) 200 MG capsule      citalopram (CELEXA) 20 MG tablet Take 1 tablet (20 mg  total) by mouth daily. 90 tablet 3   Ferrous Sulfate Dried 45 MG TBCR Take 45 mg by mouth daily with lunch.     levothyroxine (SYNTHROID) 125 MCG tablet TAKE 1 TABLET DAILY BEFORE BREAKFAST 90 tablet 3   loperamide (IMODIUM) 2 MG capsule Take 2 mg by mouth every morning.     Multiple Vitamins-Minerals (EYE VITAMINS & MINERALS) TABS Take 1 tablet by mouth daily.     pravastatin (PRAVACHOL) 40 MG tablet Take 0.5 tablets (20 mg total) by mouth daily. 45 tablet 3   Propylene Glycol (SYSTANE BALANCE OP) Place 1 drop into both eyes daily as needed (dry eyes).  NON FORMULARY Apply 1 application topically 2 (two) times daily as needed (muscle pain). Horse Liniment     No current facility-administered medications for this visit.   Facility-Administered Medications Ordered in Other Visits  Medication Dose Route Frequency Provider Last Rate Last Admin   0.9 %  sodium chloride infusion   Intravenous PRN Lloyd Huger, MD   Stopped at 03/13/21 0940    OBJECTIVE: BP 117/73   Pulse 73   Temp 98.2 F (36.8 C)   Resp 16   Wt 205 lb 12.8 oz (93.4 kg)   SpO2 99%   BMI 37.64 kg/m    Body mass index is 37.64 kg/m.    ECOG FS:0 - Asymptomatic  General: Well-developed, well-nourished, no acute distress. Eyes: Pink conjunctiva, anicteric sclera. HEENT: Normocephalic, moist mucous membranes. Lungs: No audible wheezing or coughing. Heart: Regular rate and rhythm. Abdomen: Soft, nontender, no obvious distention. Musculoskeletal: No edema, cyanosis, or clubbing. Neuro: Alert, answering all questions appropriately. Cranial nerves grossly intact. Skin: No rashes or petechiae noted. Psych: Normal affect.  LAB RESULTS:  CBC    Component Value Date/Time   WBC 6.1 05/19/2021 1256   RBC 3.70 (L) 05/19/2021 1256   HGB 10.3 (L) 05/19/2021 1256   HGB 9.1 (L) 09/03/2020 0951   HCT 34.0 (L) 05/19/2021 1256   HCT 29.5 (L) 09/03/2020 0951   PLT 127 (L) 05/19/2021 1256   PLT 153 09/03/2020 0951    MCV 91.9 05/19/2021 1256   MCV 90 09/03/2020 0951   MCV 74 (L) 12/12/2013 1015   MCH 27.8 05/19/2021 1256   MCHC 30.3 05/19/2021 1256   RDW 14.3 05/19/2021 1256   RDW 14.5 09/03/2020 0951   RDW 33.6 (H) 12/12/2013 1015   LYMPHSABS 0.9 05/19/2021 1256   LYMPHSABS 1.0 09/03/2020 0951   LYMPHSABS 1.2 12/12/2013 1015   MONOABS 0.5 05/19/2021 1256   MONOABS 0.4 12/12/2013 1015   EOSABS 0.1 05/19/2021 1256   EOSABS 0.1 09/03/2020 0951   EOSABS 0.1 12/12/2013 1015   BASOSABS 0.0 05/19/2021 1256   BASOSABS 0.0 09/03/2020 0951   BASOSABS 0.1 12/12/2013 1015   Lab Results  Component Value Date   IRON 27 (L) 05/19/2021   TIBC 358 05/19/2021   IRONPCTSAT 8 (L) 05/19/2021   Lab Results  Component Value Date   FERRITIN 30 05/19/2021      STUDIES: No results found.  ASSESSMENT & PLAN:    1. Iron deficiency anemia: Patient's hemoglobin has improved to 10.3, but her iron stores remain significantly reduced. Colonoscopy and EGD on May 02, 2020 did not reveal any distinct pathology.  Proceed with an additional 510 mg IV Feraheme today.  Return to clinic in 3 months with repeat laboratory work, further evaluation, and continuation of treatment if needed. 2. History of melanoma: Melanoma of left thigh status post resection in 1993, exact stage is not known, metastatic to inguinal lymph node. No evidence of recurrent disease. 3.  Left hip pain: Resolved.  Patient underwent hip replacement surgery on May 04, 2020.    I spent a total of 30 minutes reviewing chart data, face-to-face evaluation with the patient, counseling and coordination of care as detailed above.  Patient expressed understanding and was in agreement with this plan. She also understands that She can call clinic at any time with any questions, concerns, or complaints.    Lloyd Huger, MD   05/19/2021 6:15 PM

## 2021-05-19 ENCOUNTER — Inpatient Hospital Stay: Payer: Medicare Other

## 2021-05-19 ENCOUNTER — Other Ambulatory Visit: Payer: Self-pay

## 2021-05-19 ENCOUNTER — Inpatient Hospital Stay: Payer: Medicare Other | Attending: Oncology | Admitting: Oncology

## 2021-05-19 ENCOUNTER — Encounter: Payer: Self-pay | Admitting: Oncology

## 2021-05-19 VITALS — BP 117/73 | HR 73 | Temp 98.2°F | Resp 16 | Wt 205.8 lb

## 2021-05-19 VITALS — BP 111/58 | HR 71 | Resp 16

## 2021-05-19 DIAGNOSIS — D509 Iron deficiency anemia, unspecified: Secondary | ICD-10-CM

## 2021-05-19 DIAGNOSIS — D508 Other iron deficiency anemias: Secondary | ICD-10-CM

## 2021-05-19 LAB — CBC WITH DIFFERENTIAL/PLATELET
Abs Immature Granulocytes: 0.02 10*3/uL (ref 0.00–0.07)
Basophils Absolute: 0 10*3/uL (ref 0.0–0.1)
Basophils Relative: 0 %
Eosinophils Absolute: 0.1 10*3/uL (ref 0.0–0.5)
Eosinophils Relative: 1 %
HCT: 34 % — ABNORMAL LOW (ref 36.0–46.0)
Hemoglobin: 10.3 g/dL — ABNORMAL LOW (ref 12.0–15.0)
Immature Granulocytes: 0 %
Lymphocytes Relative: 15 %
Lymphs Abs: 0.9 10*3/uL (ref 0.7–4.0)
MCH: 27.8 pg (ref 26.0–34.0)
MCHC: 30.3 g/dL (ref 30.0–36.0)
MCV: 91.9 fL (ref 80.0–100.0)
Monocytes Absolute: 0.5 10*3/uL (ref 0.1–1.0)
Monocytes Relative: 9 %
Neutro Abs: 4.5 10*3/uL (ref 1.7–7.7)
Neutrophils Relative %: 75 %
Platelets: 127 10*3/uL — ABNORMAL LOW (ref 150–400)
RBC: 3.7 MIL/uL — ABNORMAL LOW (ref 3.87–5.11)
RDW: 14.3 % (ref 11.5–15.5)
WBC: 6.1 10*3/uL (ref 4.0–10.5)
nRBC: 0 % (ref 0.0–0.2)

## 2021-05-19 LAB — IRON AND TIBC
Iron: 27 ug/dL — ABNORMAL LOW (ref 28–170)
Saturation Ratios: 8 % — ABNORMAL LOW (ref 10.4–31.8)
TIBC: 358 ug/dL (ref 250–450)
UIBC: 331 ug/dL

## 2021-05-19 LAB — FERRITIN: Ferritin: 30 ng/mL (ref 11–307)

## 2021-05-19 MED ORDER — SODIUM CHLORIDE 0.9 % IV SOLN
Freq: Once | INTRAVENOUS | Status: AC
Start: 2021-05-19 — End: 2021-05-19
  Filled 2021-05-19: qty 250

## 2021-05-19 MED ORDER — SODIUM CHLORIDE 0.9 % IV SOLN
510.0000 mg | Freq: Once | INTRAVENOUS | Status: AC
  Administered 2021-05-19: 510 mg via INTRAVENOUS
  Filled 2021-05-19: qty 510

## 2021-05-19 NOTE — Patient Instructions (Signed)

## 2021-05-19 NOTE — Progress Notes (Signed)
Confirmed with Dr. Grayland Ormond that patient is to receive Feraheme today as she has for past infusions.

## 2021-05-19 NOTE — Progress Notes (Signed)
Pt has no concerns/complaints at this time. 

## 2021-05-20 ENCOUNTER — Ambulatory Visit: Payer: Medicare Other | Admitting: Gastroenterology

## 2021-05-21 ENCOUNTER — Ambulatory Visit (INDEPENDENT_AMBULATORY_CARE_PROVIDER_SITE_OTHER): Payer: Medicare Other | Admitting: Nurse Practitioner

## 2021-05-21 ENCOUNTER — Other Ambulatory Visit: Payer: Self-pay

## 2021-05-21 ENCOUNTER — Encounter: Payer: Self-pay | Admitting: Nurse Practitioner

## 2021-05-21 VITALS — BP 109/56 | HR 69 | Temp 98.1°F | Wt 205.0 lb

## 2021-05-21 DIAGNOSIS — D509 Iron deficiency anemia, unspecified: Secondary | ICD-10-CM

## 2021-05-21 DIAGNOSIS — F32A Depression, unspecified: Secondary | ICD-10-CM

## 2021-05-21 DIAGNOSIS — E785 Hyperlipidemia, unspecified: Secondary | ICD-10-CM

## 2021-05-21 DIAGNOSIS — E039 Hypothyroidism, unspecified: Secondary | ICD-10-CM

## 2021-05-21 DIAGNOSIS — Z7689 Persons encountering health services in other specified circumstances: Secondary | ICD-10-CM | POA: Diagnosis not present

## 2021-05-21 DIAGNOSIS — F419 Anxiety disorder, unspecified: Secondary | ICD-10-CM

## 2021-05-21 MED ORDER — PRAVASTATIN SODIUM 40 MG PO TABS: 20.0000 mg | ORAL_TABLET | Freq: Every day | ORAL | 3 refills | Status: DC

## 2021-05-21 MED ORDER — CITALOPRAM HYDROBROMIDE 20 MG PO TABS: 20.0000 mg | ORAL_TABLET | Freq: Every day | ORAL | 3 refills | Status: DC

## 2021-05-21 NOTE — Assessment & Plan Note (Signed)
Chronic.  Controlled.  Continue with current medication regimen on Celexa 20mg .  Labs ordered today. Refills sent today.  Patient feels like she can come off of the medication.  We decided we would address this at our next visit and work to wean patient off the Celexa.  Return to clinic in 6 months for reevaluation.  Call sooner if concerns arise.

## 2021-05-21 NOTE — Progress Notes (Signed)
BP (!) 109/56   Pulse 69   Temp 98.1 F (36.7 C) (Oral)   Wt 205 lb (93 kg)   SpO2 95%   BMI 37.49 kg/m    Subjective:    Patient ID: Janet Rice, female    DOB: 1945/08/31, 75 y.o.   MRN: [id number]  HPI: Janet Rice is a 75 y.o. female  Chief Complaint  Patient presents with   Establish Care    Patient is here to establish care. Patient states she was not happy with her last practice and is here to establish care with a new provider.    Patient presents to clinic to establish care with new PCP.  Patient reports a history of Iron Def anemia- unknown etiology, hypothyroidism, high cholesterol, depression- after her mom died, had a history of diarrhea- take Immodium just about every day. She has tried to find the cause of the diarrhea but she hasn't been able to.   Patient denies a history of: Hypertension, Diabetes, Anxiety, Neurological problems, and Abdominal problems.    Denies HA, CP, SOB, dizziness, palpitations, visual changes, and lower extremity swelling.  Active Ambulatory Problems    Diagnosis Date Noted   History of colonic polyps 06/08/2011   Iron deficiency anemia 08/13/2011   Hyperlipidemia 08/13/2011   Hypothyroidism 02/10/2012   Osteoporosis 08/15/2012   Spinal stenosis of lumbar region 11/13/2013   Carpal tunnel syndrome 11/13/2013   Obesity (BMI 30-39.9) 07/18/2014   DDD (degenerative disc disease), lumbar 12/04/2013   Spondylolisthesis of lumbar region 04/08/2016   Preoperative clearance 08/31/2016   Localized osteoarthrosis of left hip 08/31/2016   Status post total hip replacement, right 11/18/2016   Myalgia 07/25/2017   Polyarthralgia 07/25/2017   Spinal stenosis of lumbar region with neurogenic claudication 07/10/2018   Anxiety and depression 08/23/2018   Gastroesophageal reflux disease 08/23/2018   Normocytic anemia 12/30/2018   Weakness of limb 08/10/2019   Abnormal gait 08/10/2019   Ankle stiff 08/10/2019   Closed  trimalleolar fracture 08/10/2019   Localized osteoarthritis of left knee 11/03/2017   Localized primary osteoarthritis of hands, bilateral 11/03/2017   Malignant melanoma of left thigh ([place]) 07/13/1991   Diarrhea 02/27/2020   Chronic diarrhea 03/20/2020   Chronic low back pain 09/02/2020   Basal cell carcinoma    History of hiatal hernia    Elevated alkaline phosphatase level 10/06/2020   Cervical spondylosis with myelopathy 01/27/2016   Resolved Ambulatory Problems    Diagnosis Date Noted   Chest tightness 09/30/2010   SOB (shortness of breath) 09/30/2010   Hyperlipemia 09/30/2010   Iron deficiency 06/08/2011   Obesity 08/13/2011   General medical exam 08/13/2011   Viral URI with cough 08/25/2011   Dental abscess 09/10/2011   Middle ear effusion, right 11/10/2011   Urinary tract infection 02/10/2012   Candidiasis of breast 05/16/2013   UTI (urinary tract infection) 12/17/2013   UTI (urinary tract infection) 05/14/2015   Past Medical History:  Diagnosis Date   Adenomatous colon polyp    Anemia    Arthritis    Blood transfusion without reported diagnosis    Cataract    Childhood asthma    Chronic heartburn    Complication of anesthesia    Dyspnea    External hemorrhoids    Frequency of urination    Frequent loose stools    GERD (gastroesophageal reflux disease)    Heme positive stool    Melanoma ([place])    Metastatic melanoma ([place])    Multinodular  goiter    SCC (squamous cell carcinoma)    Past Surgical History:  Procedure Laterality Date   ABDOMINAL HYSTERECTOMY  1984   BACK SURGERY  03/2016   LUMBAR   CARPAL TUNNEL RELEASE Right 09/07/2016   Procedure: CARPAL TUNNEL RELEASE;  Surgeon: Thornton Park, MD;  Location: [facility] ORS;  Service: Orthopedics;  Laterality: Right;   CHOLECYSTECTOMY  1990   COLONOSCOPY  08/2006   Four sessile polyps found and removed in sigmoid colon at splenic flexure and ascending colon. 7 mm in size. Another removed from transverse colon 4  mm. Path Report - showed tubular adenoma and hyperplastic polyp. Advised to repeat in 3.5 years (02/2010)   COLONOSCOPY  3.2.2011   8 mm polyp in sigmoid colon, removed, 4 mm polyp in descending colon, removed. Internal hemorrhoids   ESOPHAGOGASTRODUODENOSCOPY  3.2.2011   Large hiatia hernia, esophagus normal, mulitple small sessile polyps w/no stigmata of recent bleeding found. Mildy erythermatous mucosa w/no bleeding found in gatric antrum. Normal duodenum. 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 PATH - no active inflammation, antral mucosa w/mild foveolar hyperplasia   FRACTURE SURGERY     Lymph Node Removal  12/2005   Left inguinal lymph node dissection   MELANOMA EXCISION Left 1993   Left thigh   ORIF ANKLE FRACTURE Right    Dr. Mack Guise   TOTAL HIP ARTHROPLASTY Right 11/18/2016   Procedure: TOTAL HIP ARTHROPLASTY;  Surgeon: Thornton Park, MD;  Location: [facility] ORS;  Service: Orthopedics;  Laterality: Right;   TOTAL HIP ARTHROPLASTY     left   Family History  Problem Relation Age of Onset   Aneurysm Mother    Heart disease Mother    Colon polyps Father    Diabetes Father    Dementia Father    Stroke Father        TIA   Macular degeneration Father    Hypothyroidism Daughter    Hypothyroidism Son    Leukemia Maternal Grandfather    Skin cancer Paternal Grandfather    Diabetes Other    Colon polyps Other    Colon cancer Neg Hx    Breast cancer Neg Hx    Stomach cancer Neg Hx    Rectal cancer Neg Hx    Esophageal cancer Neg Hx      Review of Systems  Eyes:  Negative for visual disturbance.  Respiratory:  Negative for cough, chest tightness and shortness of breath.   Cardiovascular:  Negative for chest pain, palpitations and leg swelling.  Neurological:  Negative for dizziness and headaches.   Per HPI unless specifically indicated above     Objective:    BP (!) 109/56   Pulse 69   Temp 98.1 F (36.7 C) (Oral)   Wt 205 lb (93 kg)   SpO2 95%   BMI  37.49 kg/m   Wt Readings from Last 3 Encounters:  05/21/21 205 lb (93 kg)  05/19/21 205 lb 12.8 oz (93.4 kg)  01/26/21 190 lb (86.2 kg)    Physical Exam Vitals and nursing note reviewed.  Constitutional:      General: She is not in acute distress.    Appearance: Normal appearance. She is normal weight. She is not ill-appearing, toxic-appearing or diaphoretic.  HENT:     Head: Normocephalic.     Right Ear: External ear normal.     Left Ear: External ear normal.     Nose: Nose normal.     Mouth/Throat:     Mouth: Mucous  membranes are moist.     Pharynx: Oropharynx is clear.  Eyes:     General:        Right eye: No discharge.        Left eye: No discharge.     Extraocular Movements: Extraocular movements intact.     Conjunctiva/sclera: Conjunctivae normal.     Pupils: Pupils are equal, round, and reactive to light.  Cardiovascular:     Rate and Rhythm: Normal rate and regular rhythm.     Heart sounds: No murmur heard. Pulmonary:     Effort: Pulmonary effort is normal. No respiratory distress.     Breath sounds: Normal breath sounds. No wheezing or rales.  Musculoskeletal:     Cervical back: Normal range of motion and neck supple.  Skin:    General: Skin is warm and dry.     Capillary Refill: Capillary refill takes less than 2 seconds.  Neurological:     General: No focal deficit present.     Mental Status: She is alert and oriented to person, place, and time. Mental status is at baseline.  Psychiatric:        Mood and Affect: Mood normal.        Behavior: Behavior normal.        Thought Content: Thought content normal.        Judgment: Judgment normal.    Results for orders placed or performed in visit on 05/19/21  Iron and TIBC  Result Value Ref Range   Iron 27 (L) 28 - 170 ug/dL   TIBC 358 250 - 450 ug/dL   Saturation Ratios 8 (L) 10.4 - 31.8 %   UIBC 331 ug/dL  Ferritin  Result Value Ref Range   Ferritin 30 11 - 307 ng/mL  CBC with Differential/Platelet   Result Value Ref Range   WBC 6.1 4.0 - 10.5 K/uL   RBC 3.70 (L) 3.87 - 5.11 MIL/uL   Hemoglobin 10.3 (L) 12.0 - 15.0 g/dL   HCT 34.0 (L) 36.0 - 46.0 %   MCV 91.9 80.0 - 100.0 fL   MCH 27.8 26.0 - 34.0 pg   MCHC 30.3 30.0 - 36.0 g/dL   RDW 14.3 11.5 - 15.5 %   Platelets 127 (L) 150 - 400 K/uL   nRBC 0.0 0.0 - 0.2 %   Neutrophils Relative % 75 %   Neutro Abs 4.5 1.7 - 7.7 K/uL   Lymphocytes Relative 15 %   Lymphs Abs 0.9 0.7 - 4.0 K/uL   Monocytes Relative 9 %   Monocytes Absolute 0.5 0.1 - 1.0 K/uL   Eosinophils Relative 1 %   Eosinophils Absolute 0.1 0.0 - 0.5 K/uL   Basophils Relative 0 %   Basophils Absolute 0.0 0.0 - 0.1 K/uL   Immature Granulocytes 0 %   Abs Immature Granulocytes 0.02 0.00 - 0.07 K/uL      Assessment & Plan:   Problem List Items Addressed This Visit       Endocrine   Hypothyroidism - Primary    Chronic.  Controlled.  Continue with current medication regimen on levothyroxine 162mg daily.  Labs ordered today.  Return to clinic in 6 months for reevaluation.  Call sooner if concerns arise.        Relevant Orders   TSH   T4, free   Comp Met (CMET)     Other   Iron deficiency anemia    Chronic.  Followed by Dr. FGrayland Ormond  Seen every 3 months.  Takes  Iron daily and also receives iron infusions every 2-3 months.       Hyperlipidemia    Chronic.  Controlled.  Continue with current medication regimen on pravastatin 33m.  Refill sent today. Labs ordered today.  Return to clinic in 6 months for reevaluation.  Call sooner if concerns arise.        Relevant Medications   pravastatin (PRAVACHOL) 40 MG tablet   Other Relevant Orders   Comp Met (CMET)   Anxiety and depression    Chronic.  Controlled.  Continue with current medication regimen on Celexa 267m  Labs ordered today. Refills sent today.  Patient feels like she can come off of the medication.  We decided we would address this at our next visit and work to wean patient off the Celexa.  Return  to clinic in 6 months for reevaluation.  Call sooner if concerns arise.        Relevant Medications   citalopram (CELEXA) 20 MG tablet   Other Relevant Orders   Comp Met (CMET)   Other Visit Diagnoses     Encounter to establish care            Follow up plan: Return in about 6 months (around 11/18/2021) for HTN, HLD, DM2 FU.   A total of 40 minutes were spent on this encounter today.  When total time is documented, this includes both the face-to-face and non-face-to-face time personally spent before, during and after the visit on the date of the encounter reviewing patient's chart, discussing plan of care with patient, and ordering labs.

## 2021-05-21 NOTE — Assessment & Plan Note (Signed)
Chronic.  Controlled.  Continue with current medication regimen on levothyroxine 125mcg daily.  Labs ordered today.  Return to clinic in 6 months for reevaluation.  Call sooner if concerns arise.   

## 2021-05-21 NOTE — Assessment & Plan Note (Signed)
Chronic.  Followed by Dr. Grayland Ormond.  Seen every 3 months.  Takes Iron daily and also receives iron infusions every 2-3 months.

## 2021-05-21 NOTE — Assessment & Plan Note (Signed)
Chronic.  Controlled.  Continue with current medication regimen on pravastatin 20mg .  Refill sent today. Labs ordered today.  Return to clinic in 6 months for reevaluation.  Call sooner if concerns arise.

## 2021-05-22 LAB — COMPREHENSIVE METABOLIC PANEL
ALT: 25 IU/L (ref 0–32)
AST: 26 IU/L (ref 0–40)
Albumin/Globulin Ratio: 1.6 (ref 1.2–2.2)
Albumin: 4.4 g/dL (ref 3.7–4.7)
Alkaline Phosphatase: 133 IU/L — ABNORMAL HIGH (ref 44–121)
BUN/Creatinine Ratio: 12 (ref 12–28)
BUN: 14 mg/dL (ref 8–27)
Bilirubin Total: <0.2 mg/dL (ref 0.0–1.2)
CO2: 28 mmol/L (ref 20–29)
Calcium: 9.6 mg/dL (ref 8.7–10.3)
Chloride: 99 mmol/L (ref 96–106)
Creatinine, Ser: 1.15 mg/dL — ABNORMAL HIGH (ref 0.57–1.00)
Globulin, Total: 2.7 g/dL (ref 1.5–4.5)
Glucose: 91 mg/dL (ref 70–99)
Potassium: 5 mmol/L (ref 3.5–5.2)
Sodium: 138 mmol/L (ref 134–144)
Total Protein: 7.1 g/dL (ref 6.0–8.5)
eGFR: 50 mL/min/{1.73_m2} — ABNORMAL LOW (ref >59)

## 2021-05-22 LAB — T4, FREE: Free T4: 1.28 ng/dL (ref 0.82–1.77)

## 2021-05-22 LAB — TSH: TSH: 2.28 u[IU]/mL (ref 0.450–4.500)

## 2021-05-22 NOTE — Addendum Note (Signed)
Addended by: Jon Billings on: 05/22/2021 01:52 PM   Modules accepted: Orders

## 2021-05-22 NOTE — Progress Notes (Signed)
Please let patient know that her thyroid labs are within normal range.  She should continue her current dose of levothyroxine.  However, her kidney function has declined.  I recommend she stop the celebrex due to it causing harm to the kidneys.  I would like her to come back for a lab visit in month to make sure it returns to normal.  Please make her a lab appointment.

## 2021-05-25 DIAGNOSIS — M7061 Trochanteric bursitis, right hip: Secondary | ICD-10-CM | POA: Diagnosis not present

## 2021-05-28 DIAGNOSIS — Z20828 Contact with and (suspected) exposure to other viral communicable diseases: Secondary | ICD-10-CM | POA: Diagnosis not present

## 2021-06-02 ENCOUNTER — Telehealth (INDEPENDENT_AMBULATORY_CARE_PROVIDER_SITE_OTHER): Payer: Medicare Other | Admitting: Nurse Practitioner

## 2021-06-02 ENCOUNTER — Encounter: Payer: Self-pay | Admitting: Nurse Practitioner

## 2021-06-02 DIAGNOSIS — J069 Acute upper respiratory infection, unspecified: Secondary | ICD-10-CM | POA: Diagnosis not present

## 2021-06-02 MED ORDER — PREDNISONE 10 MG PO TABS: ORAL_TABLET | ORAL | 0 refills | Status: DC

## 2021-06-02 NOTE — Progress Notes (Signed)
Acute Office Visit  Subjective:    Patient ID: Janet Rice, female    DOB: 1945/12/31, 75 y.o.   MRN: [id number]  Chief Complaint  Patient presents with   Cough    Patient states that 5 days ago she started a cough and a sore throat. When cough ad sore throat started she had a lot of nasal and chest congestion.     HPI Patient is in today for cough and congestion for 5 days.   UPPER RESPIRATORY TRACT INFECTION  Worst symptom: sore throat Fever: yes Cough: yes Shortness of breath: yes Wheezing: no Chest pain: no Chest tightness: no Chest congestion: yes Nasal congestion: yes Runny nose: yes Post nasal drip: yes Sneezing: no Sore throat: yes Swollen glands: no Sinus pressure: no Headache: yes Face pain: no Toothache: no Ear pain: no  Ear pressure: no  Eyes red/itching:no Eye drainage/crusting: no  Vomiting: no Rash: no Fatigue: yes Sick contacts: yes - husband  Strep contacts: no  Context: better Recurrent sinusitis: no Relief with OTC cold/cough medications: yes  Treatments attempted: tylenol, throat lozenge   Past Medical History:  Diagnosis Date   Adenomatous colon polyp    Anemia    Arthritis    Basal cell carcinoma    left supratip of nose nodular edges involved 08/27/20 Dr. Kellie Moor, left postauricular sulcus nodular bcc 01/02/20 exc 01/23/20 neg margins   Blood transfusion without reported diagnosis    Cataract    Childhood asthma    AS A CHILD   Chronic heartburn    On omeprazole   Complication of anesthesia    HARD TO WAKE UP   Dyspnea    low hgb. and iron  chronic problem   External hemorrhoids    Frequency of urination    Frequent loose stools    GERD (gastroesophageal reflux disease)    Heme positive stool    History of hiatal hernia    large   Hyperlipidemia    Hypothyroidism    Iron deficiency anemia    Melanoma ([facility])    left thigh s/p LN removal left groin   Metastatic melanoma ([place])    Followed by Dr Oliva Bustard, previous  chemo, no recurrence, 2008?   Multinodular goiter    Osteoporosis    SCC (squamous cell carcinoma)    skin    Past Surgical History:  Procedure Laterality Date   ABDOMINAL HYSTERECTOMY  1984   BACK SURGERY  03/2016   LUMBAR   CARPAL TUNNEL RELEASE Right 09/07/2016   Procedure: CARPAL TUNNEL RELEASE;  Surgeon: Thornton Park, MD;  Location: [facility] OR[facility];  Service: Orthopedics;  Laterality: Right;   CHOLECYSTECTOMY  1990   COLONOSCOPY  08/2006   Four sessile polyps found and removed in sigmoid colon at splenic flexure and ascending colon. 7 mm in size. Another removed from transverse colon 4 mm. Path Report - showed tubular adenoma and hyperplastic polyp. Advised to repeat in 3.5 years (02/2010)   COLONOSCOPY  3.2.2011   8 mm polyp in sigmoid colon, removed, 4 mm polyp in descending colon, removed. Internal hemorrhoids   ESOPHAGOGASTRODUODENOSCOPY  3.2.2011   Large hiatia hernia, esophagus normal, mulitple small sessile polyps w/no stigmata of recent bleeding found. Mildy erythermatous mucosa w/no bleeding found in gatric antrum. Normal duodenum. 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 PATH - no active inflammation, antral mucosa w/mild foveolar hyperplasia   FRACTURE SURGERY     Lymph Node Removal  12/2005   Left inguinal lymph node  dissection   MELANOMA EXCISION Left 1993   Left thigh   ORIF ANKLE FRACTURE Right    Dr. Mack Guise   TOTAL HIP ARTHROPLASTY Right 11/18/2016   Procedure: TOTAL HIP ARTHROPLASTY;  Surgeon: Thornton Park, MD;  Location: [facility] OR[facility];  Service: Orthopedics;  Laterality: Right;   TOTAL HIP ARTHROPLASTY     left    Family History  Problem Relation Age of Onset   Aneurysm Mother    Heart disease Mother    Colon polyps Father    Diabetes Father    Dementia Father    Stroke Father        TIA   Macular degeneration Father    Hypothyroidism Daughter    Hypothyroidism Son    Leukemia Maternal Grandfather    Skin cancer Paternal Grandfather     Diabetes Other    Colon polyps Other    Colon cancer Neg Hx    Breast cancer Neg Hx    Stomach cancer Neg Hx    Rectal cancer Neg Hx    Esophageal cancer Neg Hx     Social History   Socioeconomic History   Marital status: Married    Spouse name: Not on file   Number of children: 2   Years of education: Not on file   Highest education level: Not on file  Occupational History   Occupation: HR Consultant    Employer: [employer]  Tobacco Use   Smoking status: Former    Packs/day: 0.50    Years: 8.00    Pack years: 4.00    Types: Cigarettes    Quit date: 07/12/1978    Years since quitting: 42.9   Smokeless tobacco: Never  Vaping Use   Vaping Use: Never used  Substance and Sexual Activity   Alcohol use: No    Alcohol/week: 0.0 standard drinks   Drug use: No   Sexual activity: Not Currently  Other Topics Concern   Not on file  Social History Narrative   Part time [employer] as of 03/10/20 retired 08/29/20    Married    1 daughter    1 son    Social Determinants of Radio broadcast assistant Strain: Not on file  Food Insecurity: Not on file  Transportation Needs: Not on file  Physical Activity: Not on file  Stress: Not on file  Social Connections: Not on file  Intimate Partner Violence: Not on file    Outpatient Medications Prior to Visit  Medication Sig Dispense Refill   acetaminophen (TYLENOL) 650 MG CR tablet Take 1,300 mg by mouth daily.     B Complex Vitamins (B COMPLEX PO) Take by mouth.     Calcium Carbonate-Vitamin D 600-400 MG-UNIT tablet Take by mouth.     celecoxib (CELEBREX) 200 MG capsule      citalopram (CELEXA) 20 MG tablet Take 1 tablet (20 mg total) by mouth daily. 90 tablet 3   Ferrous Sulfate Dried 45 MG TBCR Take 45 mg by mouth daily with lunch.     levothyroxine (SYNTHROID) 125 MCG tablet TAKE 1 TABLET DAILY BEFORE BREAKFAST 90 tablet 3   loperamide (IMODIUM) 2 MG capsule Take 2 mg by mouth every morning.     Multiple Vitamins-Minerals (EYE  VITAMINS & MINERALS) TABS Take 1 tablet by mouth daily.     NON FORMULARY Apply 1 application topically 2 (two) times daily as needed (muscle pain). Horse Liniment     pravastatin (PRAVACHOL) 40 MG tablet Take 0.5 tablets (20 mg total)  by mouth daily. 45 tablet 3   Facility-Administered Medications Prior to Visit  Medication Dose Route Frequency Provider Last Rate Last Admin   0.9 %  sodium chloride infusion   Intravenous PRN Lloyd Huger, MD   Stopped at 03/13/21 0940    Allergies  Allergen Reactions   Sulfa Antibiotics Rash    Review of Systems  Constitutional:  Positive for fatigue and fever.  HENT:  Positive for congestion, postnasal drip, rhinorrhea, sneezing and sore throat. Negative for ear pain and sinus pressure.   Eyes: Negative.   Respiratory:  Positive for cough and shortness of breath (with coughing).   Cardiovascular: Negative.   Gastrointestinal: Negative.   Endocrine: Negative.   Genitourinary: Negative.   Musculoskeletal:  Positive for myalgias.  Skin: Negative.   Neurological: Negative.   Psychiatric/Behavioral: Negative.        Objective:    Physical Exam Vitals and nursing note reviewed.  Constitutional:      General: She is not in acute distress.    Appearance: Normal appearance.  HENT:     Head: Normocephalic.  Eyes:     Conjunctiva/sclera: Conjunctivae normal.  Pulmonary:     Effort: Pulmonary effort is normal.     Comments: Able to talk in complete sentences Neurological:     Mental Status: She is alert and oriented to person, place, and time.  Psychiatric:        Mood and Affect: Mood normal.        Behavior: Behavior normal.        Thought Content: Thought content normal.        Judgment: Judgment normal.    There were no vitals taken for this visit. Wt Readings from Last 3 Encounters:  05/21/21 205 lb (93 kg)  05/19/21 205 lb 12.8 oz (93.4 kg)  01/26/21 190 lb (86.2 kg)    Health Maintenance Due  Topic Date Due   Zoster  Vaccines- Shingrix (1 of 2) Never done    There are no preventive care reminders to display for this patient.   Lab Results  Component Value Date   TSH 2.280 05/21/2021   Lab Results  Component Value Date   WBC 6.1 05/19/2021   HGB 10.3 (L) 05/19/2021   HCT 34.0 (L) 05/19/2021   MCV 91.9 05/19/2021   PLT 127 (L) 05/19/2021   Lab Results  Component Value Date   NA 138 05/21/2021   K 5.0 05/21/2021   CO2 28 05/21/2021   GLUCOSE 91 05/21/2021   BUN 14 05/21/2021   CREATININE 1.15 (H) 05/21/2021   BILITOT <0.2 05/21/2021   ALKPHOS 133 (H) 05/21/2021   AST 26 05/21/2021   ALT 25 05/21/2021   PROT 7.1 05/21/2021   ALBUMIN 4.4 05/21/2021   CALCIUM 9.6 05/21/2021   ANIONGAP 7 12/31/2018   EGFR 50 (L) 05/21/2021   GFR 52.38 (L) 09/02/2020   Lab Results  Component Value Date   CHOL 176 09/02/2020   Lab Results  Component Value Date   HDL 52.10 09/02/2020   Lab Results  Component Value Date   LDLCALC 103 (H) 02/21/2020   Lab Results  Component Value Date   TRIG 209.0 (H) 09/02/2020   Lab Results  Component Value Date   CHOLHDL 3 09/02/2020   Lab Results  Component Value Date   HGBA1C 4.7 (L) 02/21/2020       Assessment & Plan:   Problem List Items Addressed This Visit   None Visit Diagnoses  Upper respiratory tract infection, unspecified type    -  Primary   Outside treatment window for covid-19/flu, no testing today. Will treat with prednisone taper, fluids, rest. F/U if symptoms not improving        Meds ordered this encounter  Medications   predniSONE (DELTASONE) 10 MG tablet    Sig: Take 6 tablets today, then 5 tablets tomorrow, then decrease by 1 tablet every day until gone    Dispense:  21 tablet    Refill:  0    This visit was completed via MyChart due to the restrictions of the COVID-19 pandemic. All issues as above were discussed and addressed. Physical exam was done as above through visual confirmation on MyChart. If it was felt  that the patient should be evaluated in the office, they were directed there. The patient verbally consented to this visit. Location of the patient: home Location of the provider: work Those involved with this call:  Provider: Vance Peper, NP CMA: Frazier Butt, [place] Desk/Registration: Myrlene Broker  Time spent on call:  10 minutes with patient face to face via video conference. More than 50% of this time was spent in counseling and coordination of care. 10 minutes total spent in review of patient's record and preparation of their chart.   Charyl Dancer, NP

## 2021-06-24 ENCOUNTER — Other Ambulatory Visit: Payer: Medicare Other

## 2021-06-24 ENCOUNTER — Other Ambulatory Visit: Payer: Self-pay

## 2021-06-24 DIAGNOSIS — E785 Hyperlipidemia, unspecified: Secondary | ICD-10-CM

## 2021-06-24 DIAGNOSIS — D509 Iron deficiency anemia, unspecified: Secondary | ICD-10-CM | POA: Diagnosis not present

## 2021-06-24 DIAGNOSIS — F419 Anxiety disorder, unspecified: Secondary | ICD-10-CM

## 2021-06-24 DIAGNOSIS — F32A Depression, unspecified: Secondary | ICD-10-CM | POA: Diagnosis not present

## 2021-06-25 LAB — COMPREHENSIVE METABOLIC PANEL
ALT: 18 IU/L (ref 0–32)
AST: 18 IU/L (ref 0–40)
Albumin/Globulin Ratio: 1.7 (ref 1.2–2.2)
Albumin: 4.3 g/dL (ref 3.7–4.7)
Alkaline Phosphatase: 98 IU/L (ref 44–121)
BUN/Creatinine Ratio: 13 (ref 12–28)
BUN: 16 mg/dL (ref 8–27)
Bilirubin Total: <0.2 mg/dL (ref 0.0–1.2)
CO2: 28 mmol/L (ref 20–29)
Calcium: 9.4 mg/dL (ref 8.7–10.3)
Chloride: 99 mmol/L (ref 96–106)
Creatinine, Ser: 1.19 mg/dL — ABNORMAL HIGH (ref 0.57–1.00)
Globulin, Total: 2.5 g/dL (ref 1.5–4.5)
Glucose: 163 mg/dL — ABNORMAL HIGH (ref 70–99)
Potassium: 4.9 mmol/L (ref 3.5–5.2)
Sodium: 140 mmol/L (ref 134–144)
Total Protein: 6.8 g/dL (ref 6.0–8.5)
eGFR: 48 mL/min/{1.73_m2} — ABNORMAL LOW (ref >59)

## 2021-06-25 NOTE — Progress Notes (Signed)
Please let patient know that her kidney function is still declined. She should continue to not take the celebrex. I also recommend she avoid motrin, ibuprofen and aleve.  We will continue to check this at future visits.

## 2021-07-08 ENCOUNTER — Encounter: Payer: Self-pay | Admitting: Oncology

## 2021-07-10 ENCOUNTER — Other Ambulatory Visit: Payer: Self-pay

## 2021-07-10 ENCOUNTER — Inpatient Hospital Stay: Payer: Medicare Other | Attending: Oncology

## 2021-07-10 ENCOUNTER — Encounter: Payer: Self-pay | Admitting: Oncology

## 2021-07-10 DIAGNOSIS — D509 Iron deficiency anemia, unspecified: Secondary | ICD-10-CM | POA: Insufficient documentation

## 2021-07-10 LAB — CBC WITH DIFFERENTIAL/PLATELET
Abs Immature Granulocytes: 0.02 10*3/uL (ref 0.00–0.07)
Basophils Absolute: 0 10*3/uL (ref 0.0–0.1)
Basophils Relative: 0 %
Eosinophils Absolute: 0.1 10*3/uL (ref 0.0–0.5)
Eosinophils Relative: 2 %
HCT: 26.7 % — ABNORMAL LOW (ref 36.0–46.0)
Hemoglobin: 7.9 g/dL — ABNORMAL LOW (ref 12.0–15.0)
Immature Granulocytes: 0 %
Lymphocytes Relative: 14 %
Lymphs Abs: 0.8 10*3/uL (ref 0.7–4.0)
MCH: 28.3 pg (ref 26.0–34.0)
MCHC: 29.6 g/dL — ABNORMAL LOW (ref 30.0–36.0)
MCV: 95.7 fL (ref 80.0–100.0)
Monocytes Absolute: 0.4 10*3/uL (ref 0.1–1.0)
Monocytes Relative: 7 %
Neutro Abs: 4.6 10*3/uL (ref 1.7–7.7)
Neutrophils Relative %: 77 %
Platelets: 145 10*3/uL — ABNORMAL LOW (ref 150–400)
RBC: 2.79 MIL/uL — ABNORMAL LOW (ref 3.87–5.11)
RDW: 17.2 % — ABNORMAL HIGH (ref 11.5–15.5)
WBC: 6 10*3/uL (ref 4.0–10.5)
nRBC: 0 % (ref 0.0–0.2)

## 2021-07-10 LAB — IRON AND TIBC
Iron: 18 ug/dL — ABNORMAL LOW (ref 28–170)
Saturation Ratios: 5 % — ABNORMAL LOW (ref 10.4–31.8)
TIBC: 332 ug/dL (ref 250–450)
UIBC: 314 ug/dL

## 2021-07-10 LAB — FERRITIN: Ferritin: 19 ng/mL (ref 11–307)

## 2021-07-13 ENCOUNTER — Other Ambulatory Visit: Payer: Self-pay | Admitting: Internal Medicine

## 2021-07-13 DIAGNOSIS — F419 Anxiety disorder, unspecified: Secondary | ICD-10-CM

## 2021-07-13 DIAGNOSIS — F32A Depression, unspecified: Secondary | ICD-10-CM

## 2021-07-15 ENCOUNTER — Inpatient Hospital Stay: Payer: Medicare Other | Attending: Oncology | Admitting: Oncology

## 2021-07-15 ENCOUNTER — Inpatient Hospital Stay: Payer: Medicare Other

## 2021-07-15 ENCOUNTER — Other Ambulatory Visit: Payer: Self-pay

## 2021-07-15 VITALS — BP 122/65 | HR 75 | Temp 97.1°F | Resp 16 | Wt 205.3 lb

## 2021-07-15 DIAGNOSIS — Z85820 Personal history of malignant melanoma of skin: Secondary | ICD-10-CM | POA: Diagnosis not present

## 2021-07-15 DIAGNOSIS — D509 Iron deficiency anemia, unspecified: Secondary | ICD-10-CM | POA: Diagnosis not present

## 2021-07-15 DIAGNOSIS — D508 Other iron deficiency anemias: Secondary | ICD-10-CM

## 2021-07-15 MED ORDER — SODIUM CHLORIDE 0.9 % IV SOLN
510.0000 mg | Freq: Once | INTRAVENOUS | Status: AC
  Administered 2021-07-15: 510 mg via INTRAVENOUS
  Filled 2021-07-15: qty 17

## 2021-07-15 MED ORDER — SODIUM CHLORIDE 0.9 % IV SOLN
INTRAVENOUS | Status: DC
  Filled 2021-07-15: qty 250

## 2021-07-15 NOTE — Progress Notes (Signed)
Pt c/o of dyspnea on exertion and occasional sharp pain in left side x1 week. Denies any cardiac symptoms. Recent Negative Covid test. Pt states "it feels more like my lungs than my heart."

## 2021-07-16 ENCOUNTER — Encounter: Payer: Self-pay | Admitting: Oncology

## 2021-07-16 NOTE — Progress Notes (Signed)
[place]  [phone]  Fax:([phone]) [place] DOB: [birth year]  MR#: [id number]  QVZ#:[id number]  Patient Care Team: Jon Billings, NP as PCP - General (Nurse Practitioner) Lloyd Huger, MD as Consulting Physician (Hematology and Oncology)   CHIEF COMPLAINT: Iron deficiency anemia.  INTERVAL HISTORY: Patient returns to clinic today as an add-on after experienced increased weakness and fatigue and found to have a declining hemoglobin and iron stores.  She otherwise feels well.   She has no neurologic complaints. She denies any recent fevers or illnesses. She has a good appetite and denies weight loss.  She denies any chest pain, shortness of breath, cough, or hemoptysis.  She denies any nausea, vomiting, constipation, or diarrhea. She has no melena or hematochezia. She has no urinary complaints.  Patient offers no further specific complaints today.  REVIEW OF SYSTEMS:   Review of Systems  Constitutional:  Positive for malaise/fatigue. Negative for fever and weight loss.  Eyes: Negative.   Respiratory: Negative.  Negative for cough and shortness of breath.   Cardiovascular: Negative.  Negative for chest pain and leg swelling.  Gastrointestinal: Negative.  Negative for abdominal pain, blood in stool and melena.  Genitourinary: Negative.  Negative for hematuria.  Musculoskeletal: Negative.  Negative for back pain and joint pain.  Skin: Negative.  Negative for rash.  Neurological:  Positive for weakness. Negative for dizziness, sensory change and focal weakness.  Psychiatric/Behavioral: Negative.  The patient is not nervous/anxious.    As per HPI. Otherwise, a complete review of systems is negative.  ONCOLOGY HISTORY: Oncology History   No history exists.    PAST MEDICAL HISTORY: Past Medical History:  Diagnosis Date   Adenomatous colon polyp    Anemia    Arthritis    Basal cell carcinoma    left supratip of nose nodular  edges involved 08/27/20 Dr. Kellie Moor, left postauricular sulcus nodular bcc 01/02/20 exc 01/23/20 neg margins   Blood transfusion without reported diagnosis    Cataract    Childhood asthma    AS A CHILD   Chronic heartburn    On omeprazole   Complication of anesthesia    HARD TO WAKE UP   Dyspnea    low hgb. and iron  chronic problem   External hemorrhoids    Frequency of urination    Frequent loose stools    GERD (gastroesophageal reflux disease)    Heme positive stool    History of hiatal hernia    large   Hyperlipidemia    Hypothyroidism    Iron deficiency anemia    Melanoma (HCC)    left thigh s/p LN removal left groin   Metastatic melanoma ([place])    Followed by Dr Oliva Bustard, previous chemo, no recurrence, 2008?   Multinodular goiter    Osteoporosis    SCC (squamous cell carcinoma)    skin    PAST SURGICAL HISTORY: Past Surgical History:  Procedure Laterality Date   ABDOMINAL HYSTERECTOMY  1984   BACK SURGERY  03/2016   LUMBAR   CARPAL TUNNEL RELEASE Right 09/07/2016   Procedure: CARPAL TUNNEL RELEASE;  Surgeon: Thornton Park, MD;  Location: [facility] ORS;  Service: Orthopedics;  Laterality: Right;   CHOLECYSTECTOMY  1990   COLONOSCOPY  08/2006   Four sessile polyps found and removed in sigmoid colon at splenic flexure and ascending colon. 7 mm in size. Another removed from transverse colon 4 mm. Path Report - showed tubular adenoma and hyperplastic polyp.  Advised to repeat in 3.5 years (02/2010)   COLONOSCOPY  3.2.2011   8 mm polyp in sigmoid colon, removed, 4 mm polyp in descending colon, removed. Internal hemorrhoids   ESOPHAGOGASTRODUODENOSCOPY  3.2.2011   Large hiatia hernia, esophagus normal, mulitple small sessile polyps w/no stigmata of recent bleeding found. Mildy erythermatous mucosa w/no bleeding found in gatric antrum. Normal duodenum. 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 PATH - no active inflammation, antral mucosa w/mild foveolar hyperplasia    FRACTURE SURGERY     Lymph Node Removal  12/2005   Left inguinal lymph node dissection   MELANOMA EXCISION Left 1993   Left thigh   ORIF ANKLE FRACTURE Right    Dr. Mack Guise   TOTAL HIP ARTHROPLASTY Right 11/18/2016   Procedure: TOTAL HIP ARTHROPLASTY;  Surgeon: Thornton Park, MD;  Location: [facility] ORS;  Service: Orthopedics;  Laterality: Right;   TOTAL HIP ARTHROPLASTY     left    FAMILY HISTORY Family History  Problem Relation Age of Onset   Aneurysm Mother    Heart disease Mother    Colon polyps Father    Diabetes Father    Dementia Father    Stroke Father        TIA   Macular degeneration Father    Hypothyroidism Daughter    Hypothyroidism Son    Leukemia Maternal Grandfather    Skin cancer Paternal Grandfather    Diabetes Other    Colon polyps Other    Colon cancer Neg Hx    Breast cancer Neg Hx    Stomach cancer Neg Hx    Rectal cancer Neg Hx    Esophageal cancer Neg Hx     GYNECOLOGIC HISTORY:  No LMP recorded. Patient has had a hysterectomy.     ADVANCED DIRECTIVES:    HEALTH MAINTENANCE: Social History   Tobacco Use   Smoking status: Former    Packs/day: 0.50    Years: 8.00    Pack years: 4.00    Types: Cigarettes    Quit date: 07/12/1978    Years since quitting: 43.0   Smokeless tobacco: Never  Vaping Use   Vaping Use: Never used  Substance Use Topics   Alcohol use: No    Alcohol/week: 0.0 standard drinks   Drug use: No    Allergies  Allergen Reactions   Sulfa Antibiotics Rash    Current Outpatient Medications  Medication Sig Dispense Refill   acetaminophen (TYLENOL) 650 MG CR tablet Take 1,300 mg by mouth daily.     B Complex Vitamins (B COMPLEX PO) Take by mouth.     Calcium Carbonate-Vitamin D 600-400 MG-UNIT tablet Take by mouth.     celecoxib (CELEBREX) 200 MG capsule      citalopram (CELEXA) 20 MG tablet Take 1 tablet (20 mg total) by mouth daily. 90 tablet 3   levothyroxine (SYNTHROID) 125 MCG tablet TAKE 1 TABLET DAILY BEFORE  BREAKFAST 90 tablet 3   loperamide (IMODIUM) 2 MG capsule Take 2 mg by mouth every morning.     Multiple Vitamins-Minerals (EYE VITAMINS & MINERALS) TABS Take 1 tablet by mouth daily.     NON FORMULARY Apply 1 application topically 2 (two) times daily as needed (muscle pain). Horse Liniment     pravastatin (PRAVACHOL) 40 MG tablet Take 0.5 tablets (20 mg total) by mouth daily. 45 tablet 3   Ferrous Sulfate Dried 45 MG TBCR Take 45 mg by mouth daily with lunch. (Patient not taking: Reported on 07/15/2021)  No current facility-administered medications for this visit.   Facility-Administered Medications Ordered in Other Visits  Medication Dose Route Frequency Provider Last Rate Last Admin   0.9 %  sodium chloride infusion   Intravenous PRN Lloyd Huger, MD   Stopped at 03/13/21 0940    OBJECTIVE: BP 122/65    Pulse 75    Temp (!) 97.1 F (36.2 C)    Resp 16    Wt 205 lb 4.8 oz (93.1 kg)    SpO2 97%    BMI 37.55 kg/m    Body mass index is 37.55 kg/m.    ECOG FS:0 - Asymptomatic  General: Well-developed, well-nourished, no acute distress. Eyes: Pink conjunctiva, anicteric sclera. HEENT: Normocephalic, moist mucous membranes. Lungs: No audible wheezing or coughing. Heart: Regular rate and rhythm. Abdomen: Soft, nontender, no obvious distention. Musculoskeletal: No edema, cyanosis, or clubbing. Neuro: Alert, answering all questions appropriately. Cranial nerves grossly intact. Skin: No rashes or petechiae noted. Psych: Normal affect.  LAB RESULTS:  CBC    Component Value Date/Time   WBC 6.0 07/10/2021 1101   RBC 2.79 (L) 07/10/2021 1101   HGB 7.9 (L) 07/10/2021 1101   HGB 9.1 (L) 09/03/2020 0951   HCT 26.7 (L) 07/10/2021 1101   HCT 29.5 (L) 09/03/2020 0951   PLT 145 (L) 07/10/2021 1101   PLT 153 09/03/2020 0951   MCV 95.7 07/10/2021 1101   MCV 90 09/03/2020 0951   MCV 74 (L) 12/12/2013 1015   MCH 28.3 07/10/2021 1101   MCHC 29.6 (L) 07/10/2021 1101   RDW 17.2 (H)  07/10/2021 1101   RDW 14.5 09/03/2020 0951   RDW 33.6 (H) 12/12/2013 1015   LYMPHSABS 0.8 07/10/2021 1101   LYMPHSABS 1.0 09/03/2020 0951   LYMPHSABS 1.2 12/12/2013 1015   MONOABS 0.4 07/10/2021 1101   MONOABS 0.4 12/12/2013 1015   EOSABS 0.1 07/10/2021 1101   EOSABS 0.1 09/03/2020 0951   EOSABS 0.1 12/12/2013 1015   BASOSABS 0.0 07/10/2021 1101   BASOSABS 0.0 09/03/2020 0951   BASOSABS 0.1 12/12/2013 1015   Lab Results  Component Value Date   IRON 18 (L) 07/10/2021   TIBC 332 07/10/2021   IRONPCTSAT 5 (L) 07/10/2021   Lab Results  Component Value Date   FERRITIN 19 07/10/2021      STUDIES: No results found.  ASSESSMENT & PLAN:    1. Iron deficiency anemia: Patient's hemoglobin has decreased to 7.9 as well as declining iron stores.  She is also symptomatic.  Colonoscopy and EGD on May 02, 2020 did not reveal any distinct pathology.  Proceed with 510 mg IV Feraheme today.  Return to clinic in 1 week for second infusion.  Patient will then return to clinic in 3 months with repeat laboratory work for evaluation, and continuation of treatment if needed.   2. History of melanoma: Melanoma of left thigh status post resection in 1993, exact stage is not known, metastatic to inguinal lymph node. No evidence of recurrent disease. 3.  Left hip pain: Resolved.  Patient underwent hip replacement surgery on May 04, 2020.    I spent a total of 30 minutes reviewing chart data, face-to-face evaluation with the patient, counseling and coordination of care as detailed above.   Patient expressed understanding and was in agreement with this plan. She also understands that She can call clinic at any time with any questions, concerns, or complaints.    Lloyd Huger, MD   07/16/2021 7:21 AM

## 2021-07-21 ENCOUNTER — Telehealth: Payer: Self-pay | Admitting: Nurse Practitioner

## 2021-07-21 NOTE — Telephone Encounter (Signed)
Copied from [place] [id number]. Topic: Medicare AWV >> Jul 21, 2021 11:05 AM Lavonia Drafts wrote: Reason for CRM:  Left message for patient to call back and schedule the Medicare Annual Wellness Visit (AWV) virtually or by telephone.  Last AWV 04/30/20  Please schedule at anytime with [facility]-Nurse Health Advisor.  45 minute appointment  Any questions, please call me at [phone]

## 2021-07-22 ENCOUNTER — Other Ambulatory Visit: Payer: Self-pay

## 2021-07-22 ENCOUNTER — Inpatient Hospital Stay: Payer: Medicare Other

## 2021-07-22 VITALS — BP 129/57 | HR 74 | Temp 97.7°F | Resp 20

## 2021-07-22 DIAGNOSIS — Z85820 Personal history of malignant melanoma of skin: Secondary | ICD-10-CM | POA: Diagnosis not present

## 2021-07-22 DIAGNOSIS — D509 Iron deficiency anemia, unspecified: Secondary | ICD-10-CM | POA: Diagnosis not present

## 2021-07-22 DIAGNOSIS — D508 Other iron deficiency anemias: Secondary | ICD-10-CM

## 2021-07-22 MED ORDER — SODIUM CHLORIDE 0.9 % IV SOLN
510.0000 mg | Freq: Once | INTRAVENOUS | Status: AC
  Administered 2021-07-22: 510 mg via INTRAVENOUS
  Filled 2021-07-22: qty 17

## 2021-07-22 MED ORDER — SODIUM CHLORIDE 0.9 % IV SOLN
INTRAVENOUS | Status: DC
  Filled 2021-07-22: qty 250

## 2021-08-17 ENCOUNTER — Other Ambulatory Visit: Payer: Medicare Other

## 2021-08-18 ENCOUNTER — Ambulatory Visit: Payer: Medicare Other | Admitting: Nurse Practitioner

## 2021-08-18 ENCOUNTER — Ambulatory Visit: Payer: Medicare Other

## 2021-08-27 DIAGNOSIS — Z85820 Personal history of malignant melanoma of skin: Secondary | ICD-10-CM | POA: Diagnosis not present

## 2021-08-27 DIAGNOSIS — L820 Inflamed seborrheic keratosis: Secondary | ICD-10-CM | POA: Diagnosis not present

## 2021-08-27 DIAGNOSIS — Z85828 Personal history of other malignant neoplasm of skin: Secondary | ICD-10-CM | POA: Diagnosis not present

## 2021-08-27 DIAGNOSIS — Z872 Personal history of diseases of the skin and subcutaneous tissue: Secondary | ICD-10-CM | POA: Diagnosis not present

## 2021-08-27 DIAGNOSIS — L821 Other seborrheic keratosis: Secondary | ICD-10-CM | POA: Diagnosis not present

## 2021-09-04 ENCOUNTER — Telehealth: Payer: Self-pay | Admitting: Nurse Practitioner

## 2021-09-04 NOTE — Telephone Encounter (Signed)
Copied from [place] [id number]. Topic: Medicare AWV >> Sep 04, 2021 10:56 AM Lavonia Drafts wrote: Reason for CRM:  Left message for patient to call back and schedule the Medicare Annual Wellness Visit (AWV) virtually or by telephone.  Last AWV 04/30/20  Please schedule at anytime with [facility]-Nurse Health Advisor.  45 minute appointment  Any questions, please call me at [phone]

## 2021-09-07 DIAGNOSIS — Z20822 Contact with and (suspected) exposure to covid-19: Secondary | ICD-10-CM | POA: Diagnosis not present

## 2021-09-09 ENCOUNTER — Ambulatory Visit (INDEPENDENT_AMBULATORY_CARE_PROVIDER_SITE_OTHER): Payer: Medicare Other | Admitting: *Deleted

## 2021-09-09 DIAGNOSIS — Z780 Asymptomatic menopausal state: Secondary | ICD-10-CM | POA: Diagnosis not present

## 2021-09-09 DIAGNOSIS — Z0000 Encounter for general adult medical examination without abnormal findings: Secondary | ICD-10-CM

## 2021-09-09 NOTE — Patient Instructions (Signed)
Janet Rice , Thank you for taking time to come for your Medicare Wellness Visit. I appreciate your ongoing commitment to your health goals. Please review the following plan we discussed and let me know if I can assist you in the future.   Screening recommendations/referrals: Colonoscopy: no longer required  Mammogram: Education provided  Bone Density: ordered Recommended yearly ophthalmology/optometry visit for glaucoma screening and checkup Recommended yearly dental visit for hygiene and checkup  Vaccinations: Influenza vaccine: up to date Pneumococcal vaccine: up to date Tdap vaccine: up to date Shingles vaccine: Education provided      Next appointment: 11-18-2021 @ 1:00  [facility] 65 Years and Older, Female Preventive care refers to lifestyle choices and visits with your health care provider that can promote health and wellness. What does preventive care include? A yearly physical exam. This is also called an annual well check. Dental exams once or twice a year. Routine eye exams. Ask your health care provider how often you should have your eyes checked. Personal lifestyle choices, including: Daily care of your teeth and gums. Regular physical activity. Eating a healthy diet. Avoiding tobacco and drug use. Limiting alcohol use. Practicing safe sex. Taking low-dose aspirin every day. Taking vitamin and mineral supplements as recommended by your health care provider. What happens during an annual well check? The services and screenings done by your health care provider during your annual well check will depend on your age, overall health, lifestyle risk factors, and family history of disease. Counseling  Your health care provider may ask you questions about your: Alcohol use. Tobacco use. Drug use. Emotional well-being. Home and relationship well-being. Sexual activity. Eating habits. History of falls. Memory and ability to understand  (cognition). Work and work Statistician. Reproductive health. Screening  You may have the following tests or measurements: Height, weight, and BMI. Blood pressure. Lipid and cholesterol levels. These may be checked every 5 years, or more frequently if you are over 55 years old. Skin check. Lung cancer screening. You may have this screening every year starting at age 38 if you have a 30-pack-year history of smoking and currently smoke or have quit within the past 15 years. Fecal occult blood test (FOBT) of the stool. You may have this test every year starting at age 81. Flexible sigmoidoscopy or colonoscopy. You may have a sigmoidoscopy every 5 years or a colonoscopy every 10 years starting at age 83. Hepatitis C blood test. Hepatitis B blood test. Sexually transmitted disease (STD) testing. Diabetes screening. This is done by checking your blood sugar (glucose) after you have not eaten for a while (fasting). You may have this done every 1-3 years. Bone density scan. This is done to screen for osteoporosis. You may have this done starting at age 38. Mammogram. This may be done every 1-2 years. Talk to your health care provider about how often you should have regular mammograms. Talk with your health care provider about your test results, treatment options, and if necessary, the need for more tests. Vaccines  Your health care provider may recommend certain vaccines, such as: Influenza vaccine. This is recommended every year. Tetanus, diphtheria, and acellular pertussis (Tdap, Td) vaccine. You may need a Td booster every 10 years. Zoster vaccine. You may need this after age 40. Pneumococcal 13-valent conjugate (PCV13) vaccine. One dose is recommended after age 47. Pneumococcal polysaccharide (PPSV23) vaccine. One dose is recommended after age 43. Talk to your health care provider about which screenings and vaccines you need  and how often you need them. This information is not intended to  replace advice given to you by your health care provider. Make sure you discuss any questions you have with your health care provider. Document Released: 07/25/2015 Document Revised: 03/17/2016 Document Reviewed: 04/29/2015 Elsevier Interactive Patient Education  2017 [place] Prevention in the Home Falls can cause injuries. They can happen to people of all ages. There are many things you can do to make your home safe and to help prevent falls. What can I do on the outside of my home? Regularly fix the edges of walkways and driveways and fix any cracks. Remove anything that might make you trip as you walk through a door, such as a raised step or threshold. Trim any bushes or trees on the path to your home. Use bright outdoor lighting. Clear any walking paths of anything that might make someone trip, such as rocks or tools. Regularly check to see if handrails are loose or broken. Make sure that both sides of any steps have handrails. Any raised decks and porches should have guardrails on the edges. Have any leaves, snow, or ice cleared regularly. Use sand or salt on walking paths during winter. Clean up any spills in your garage right away. This includes oil or grease spills. What can I do in the bathroom? Use night lights. Install grab bars by the toilet and in the tub and shower. Do not use towel bars as grab bars. Use non-skid mats or decals in the tub or shower. If you need to sit down in the shower, use a plastic, non-slip stool. Keep the floor dry. Clean up any water that spills on the floor as soon as it happens. Remove soap buildup in the tub or shower regularly. Attach bath mats securely with double-sided non-slip rug tape. Do not have throw rugs and other things on the floor that can make you trip. What can I do in the bedroom? Use night lights. Make sure that you have a light by your bed that is easy to reach. Do not use any sheets or blankets that are too big for  your bed. They should not hang down onto the floor. Have a firm chair that has side arms. You can use this for support while you get dressed. Do not have throw rugs and other things on the floor that can make you trip. What can I do in the kitchen? Clean up any spills right away. Avoid walking on wet floors. Keep items that you use a lot in easy-to-reach places. If you need to reach something above you, use a strong step stool that has a grab bar. Keep electrical cords out of the way. Do not use floor polish or wax that makes floors slippery. If you must use wax, use non-skid floor wax. Do not have throw rugs and other things on the floor that can make you trip. What can I do with my stairs? Do not leave any items on the stairs. Make sure that there are handrails on both sides of the stairs and use them. Fix handrails that are broken or loose. Make sure that handrails are as long as the stairways. Check any carpeting to make sure that it is firmly attached to the stairs. Fix any carpet that is loose or worn. Avoid having throw rugs at the top or bottom of the stairs. If you do have throw rugs, attach them to the floor with carpet tape. Make sure that you  have a light switch at the top of the stairs and the bottom of the stairs. If you do not have them, ask someone to add them for you. What else can I do to help prevent falls? Wear shoes that: Do not have high heels. Have rubber bottoms. Are comfortable and fit you well. Are closed at the toe. Do not wear sandals. If you use a stepladder: Make sure that it is fully opened. Do not climb a closed stepladder. Make sure that both sides of the stepladder are locked into place. Ask someone to hold it for you, if possible. Clearly mark and make sure that you can see: Any grab bars or handrails. First and last steps. Where the edge of each step is. Use tools that help you move around (mobility aids) if they are needed. These  include: Canes. Walkers. Scooters. Crutches. Turn on the lights when you go into a dark area. Replace any light bulbs as soon as they burn out. Set up your furniture so you have a clear path. Avoid moving your furniture around. If any of your floors are uneven, fix them. If there are any pets around you, be aware of where they are. Review your medicines with your doctor. Some medicines can make you feel dizzy. This can increase your chance of falling. Ask your doctor what other things that you can do to help prevent falls. This information is not intended to replace advice given to you by your health care provider. Make sure you discuss any questions you have with your health care provider. Document Released: 04/24/2009 Document Revised: 12/04/2015 Document Reviewed: 08/02/2014 Elsevier Interactive Patient Education  2017 [employer].

## 2021-09-09 NOTE — Progress Notes (Signed)
Subjective:   Janet Rice is a 76 y.o. female who presents for Medicare Annual (Subsequent) preventive examination.  I connected with  Janet Rice on 09/09/21 by a telephone enabled telemedicine application and verified that I am speaking with the correct person using two identifiers.   I discussed the limitations of evaluation and management by telemedicine. The patient expressed understanding and agreed to proceed.  Patient location: home  Provider location: Tele-Health not in office    Review of Systems     Cardiac Risk Factors include: advanced age (>71men, >71 women);obesity (BMI >30kg/m2)     Objective:    Today's Vitals   09/09/21 0954  PainSc: 6    There is no height or weight on file to calculate BMI.  Advanced Directives 09/09/2021 07/15/2021 05/19/2021 02/02/2021 10/21/2020 04/30/2020 04/15/2020  Does Patient Have a Medical Advance Directive? Yes Yes Yes - No Yes Yes  Type of Paramedic of [place];Living will [place];Living will - [place];Living will [place];Living will [place];Living will  Does patient want to make changes to medical advance directive? - No - Patient declined No - Patient declined No - Patient declined Yes (MAU/Ambulatory/Procedural Areas - Information given) No - Patient declined No - Patient declined  Copy of [place] in Chart? No - copy requested - - - - No - copy requested No - copy requested  Would patient like information on creating a medical advance directive? - - - - Yes (MAU/Ambulatory/Procedural Areas - Information given) - -    Current Medications (verified) Outpatient Encounter Medications as of 09/09/2021  Medication Sig   acetaminophen (TYLENOL) 650 MG CR tablet Take 1,300 mg by mouth daily.   B Complex Vitamins (B COMPLEX PO) Take by mouth.   Calcium Carbonate-Vitamin  D 600-400 MG-UNIT tablet Take by mouth.   citalopram (CELEXA) 20 MG tablet Take 1 tablet (20 mg total) by mouth daily.   levothyroxine (SYNTHROID) 125 MCG tablet TAKE 1 TABLET DAILY BEFORE BREAKFAST   Multiple Vitamins-Minerals (EYE VITAMINS & MINERALS) TABS Take 1 tablet by mouth daily.   NON FORMULARY Apply 1 application topically 2 (two) times daily as needed (muscle pain). Horse Liniment   pravastatin (PRAVACHOL) 40 MG tablet Take 0.5 tablets (20 mg total) by mouth daily.   celecoxib (CELEBREX) 200 MG capsule    Ferrous Sulfate Dried 45 MG TBCR Take 45 mg by mouth daily with lunch. (Patient not taking: Reported on 07/15/2021)   loperamide (IMODIUM) 2 MG capsule Take 2 mg by mouth every morning.   Facility-Administered Encounter Medications as of 09/09/2021  Medication   0.9 %  sodium chloride infusion    Allergies (verified) Sulfa antibiotics   History: Past Medical History:  Diagnosis Date   Adenomatous colon polyp    Anemia    Arthritis    Basal cell carcinoma    left supratip of nose nodular edges involved 08/27/20 Dr. Kellie Moor, left postauricular sulcus nodular bcc 01/02/20 exc 01/23/20 neg margins   Blood transfusion without reported diagnosis    Cataract    Childhood asthma    AS A CHILD   Chronic heartburn    On omeprazole   Complication of anesthesia    HARD TO WAKE UP   Dyspnea    low hgb. and iron  chronic problem   External hemorrhoids    Frequency of urination    Frequent loose  stools    GERD (gastroesophageal reflux disease)    Heme positive stool    History of hiatal hernia    large   Hyperlipidemia    Hypothyroidism    Iron deficiency anemia    Melanoma (HCC)    left thigh s/p LN removal left groin   Metastatic melanoma ([place])    Followed by Dr Oliva Bustard, previous chemo, no recurrence, 2008?   Multinodular goiter    Osteoporosis    SCC (squamous cell carcinoma)    skin   Past Surgical History:  Procedure Laterality Date   ABDOMINAL HYSTERECTOMY  1984    BACK SURGERY  03/2016   LUMBAR   CARPAL TUNNEL RELEASE Right 09/07/2016   Procedure: CARPAL TUNNEL RELEASE;  Surgeon: Thornton Park, MD;  Location: [facility] ORS;  Service: Orthopedics;  Laterality: Right;   CHOLECYSTECTOMY  1990   COLONOSCOPY  08/2006   Four sessile polyps found and removed in sigmoid colon at splenic flexure and ascending colon. 7 mm in size. Another removed from transverse colon 4 mm. Path Report - showed tubular adenoma and hyperplastic polyp. Advised to repeat in 3.5 years (02/2010)   COLONOSCOPY  3.2.2011   8 mm polyp in sigmoid colon, removed, 4 mm polyp in descending colon, removed. Internal hemorrhoids   ESOPHAGOGASTRODUODENOSCOPY  3.2.2011   Large hiatia hernia, esophagus normal, mulitple small sessile polyps w/no stigmata of recent bleeding found. Mildy erythermatous mucosa w/no bleeding found in gatric antrum. Normal duodenum. 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 PATH - no active inflammation, antral mucosa w/mild foveolar hyperplasia   FRACTURE SURGERY     Lymph Node Removal  12/2005   Left inguinal lymph node dissection   MELANOMA EXCISION Left 1993   Left thigh   ORIF ANKLE FRACTURE Right    Dr. Mack Guise   TOTAL HIP ARTHROPLASTY Right 11/18/2016   Procedure: TOTAL HIP ARTHROPLASTY;  Surgeon: Thornton Park, MD;  Location: [facility] ORS;  Service: Orthopedics;  Laterality: Right;   TOTAL HIP ARTHROPLASTY     left   Family History  Problem Relation Age of Onset   Aneurysm Mother    Heart disease Mother    Colon polyps Father    Diabetes Father    Dementia Father    Stroke Father        TIA   Macular degeneration Father    Hypothyroidism Daughter    Hypothyroidism Son    Leukemia Maternal Grandfather    Skin cancer Paternal Grandfather    Diabetes Other    Colon polyps Other    Colon cancer Neg Hx    Breast cancer Neg Hx    Stomach cancer Neg Hx    Rectal cancer Neg Hx    Esophageal cancer Neg Hx    Social History   Socioeconomic  History   Marital status: Married    Spouse name: Not on file   Number of children: 2   Years of education: Not on file   Highest education level: Not on file  Occupational History   Occupation: HR Consultant    Employer: [employer]  Tobacco Use   Smoking status: Former    Packs/day: 0.50    Years: 8.00    Pack years: 4.00    Types: Cigarettes    Quit date: 07/12/1978    Years since quitting: 43.1   Smokeless tobacco: Never  Vaping Use   Vaping Use: Never used  Substance and Sexual Activity   Alcohol use: No    Alcohol/week:  0.0 standard drinks   Drug use: No   Sexual activity: Not Currently  Other Topics Concern   Not on file  Social History Narrative   Part time [employer] as of 03/10/20 retired 08/29/20    Married    1 daughter    1 son    Social Determinants of Radio broadcast assistant Strain: Low Risk    Difficulty of Paying Living Expenses: Not hard at all  Food Insecurity: No Food Insecurity   Worried About Charity fundraiser in the Last Year: Never true   Arboriculturist in the Last Year: Never true  Transportation Needs: No Transportation Needs   Lack of Transportation (Medical): No   Lack of Transportation (Non-Medical): No  Physical Activity: Insufficiently Active   Days of Exercise per Week: 3 days   Minutes of Exercise per Session: 30 min  Stress: No Stress Concern Present   Feeling of Stress : Not at all  Social Connections: Moderately Integrated   Frequency of Communication with Friends and Family: More than three times a week   Frequency of Social Gatherings with Friends and Family: Once a week   Attends Religious Services: Never   Marine scientist or Organizations: Yes   Attends Music therapist: 1 to 4 times per year   Marital Status: Married    Tobacco Counseling Counseling given: Not Answered   Clinical Intake:  Pre-visit preparation completed: Yes  Pain : 0-10 Pain Score: 6  Pain Type: Chronic pain Pain Descriptors  / Indicators: Constant, Burning, Aching Pain Onset: More than a month ago Pain Frequency: Constant Pain Relieving Factors: tylenol  Pain Relieving Factors: tylenol  Nutritional Risks: None Diabetes: No  How often do you need to have someone help you when you read instructions, pamphlets, or other written materials from your doctor or pharmacy?: 1 - Never  Diabetic?  no  Interpreter Needed?: No  Information entered by :: Leroy Kennedy LPN   Activities of Daily Living In your present state of health, do you have any difficulty performing the following activities: 09/09/2021  Hearing? Y  Vision? N  Difficulty concentrating or making decisions? N  Walking or climbing stairs? N  Dressing or bathing? N  Doing errands, shopping? N  Preparing Food and eating ? N  Using the Toilet? N  In the past six months, have you accidently leaked urine? N  Do you have problems with loss of bowel control? N  Managing your Medications? N  Managing your Finances? N  Housekeeping or managing your Housekeeping? N  Some recent data might be hidden    Patient Care Team: Jon Billings, NP as PCP - General (Nurse Practitioner) Lloyd Huger, MD as Consulting Physician (Hematology and Oncology)  Indicate any recent Medical Services you may have received from other than [facility] providers in the past year (date may be approximate).     Assessment:   This is a routine wellness examination for [place].  Hearing/Vision screen Hearing Screening - Comments:: Has had hearing done some hearing loss No hearing aids Vision Screening - Comments:: Up to date [place]  Dietary issues and exercise activities discussed: Current Exercise Habits: Home exercise routine, Type of exercise: walking, Time (Minutes): 20, Frequency (Times/Week): 3, Weekly Exercise (Minutes/Week): 60, Intensity: Mild   Goals Addressed             This Visit's Progress    Increase physical activity  Depression  Screen PHQ 2/9 Scores 09/09/2021 05/21/2021 09/02/2020 04/30/2020 02/27/2020 12/13/2018 08/22/2018  PHQ - 2 Score 0 0 0 0 0 0 0  PHQ- 9 Score - 1 0 - 0 - -    Fall Risk Fall Risk  09/09/2021 05/21/2021 09/02/2020 04/30/2020 02/27/2020  Falls in the past year? 0 0 0 0 0  Number falls in past yr: 0 0 0 0 0  Injury with Fall? 0 0 0 - 0  Risk for fall due to : - No Fall Risks Impaired balance/gait - Impaired balance/gait  Follow up Education provided;Falls prevention discussed;Falls evaluation completed Falls evaluation completed Falls evaluation completed Falls evaluation completed Falls evaluation completed    FALL RISK PREVENTION PERTAINING TO THE HOME:  Any stairs in or around the home? No  If so, are there any without handrails? No  Home free of loose throw rugs in walkways, pet beds, electrical cords, etc? Yes  Adequate lighting in your home to reduce risk of falls? Yes   ASSISTIVE DEVICES UTILIZED TO PREVENT FALLS:  Life alert? No  Use of a cane, walker or w/c? Yes  Grab bars in the bathroom? Yes  Shower chair or bench in shower? Yes  Elevated toilet seat or a handicapped toilet? Yes   TIMED UP AND GO:  Was the test performed? No .    Cognitive Function: MMSE - Mini Mental State Exam 12/30/2017 10/15/2015  Orientation to time 5 5  Orientation to Place 5 5  Registration 3 3  Attention/ Calculation 5 5  Recall 3 3  Language- name 2 objects 2 2  Language- repeat 1 1  Language- follow 3 step command 3 3  Language- read & follow direction 1 1  Write a sentence 1 1  Copy design 1 1  Total score 30 30     6CIT Screen 09/09/2021  What Year? 0 points  What month? 0 points  What time? 0 points  Count back from 20 0 points  Months in reverse 0 points  Repeat phrase 0 points  Total Score 0    Immunizations Immunization History  Administered Date(s) Administered   Fluad Quad(high Dose 65+) 04/26/2019, 04/29/2020   Influenza Split 08/17/2011, 05/12/2014   Influenza, High Dose  Seasonal PF 08/31/2016, 07/22/2017, 06/13/2018, 04/23/2021   Influenza-Unspecified 05/16/2013, 04/11/2014, 04/27/2019   PFIZER Comirnaty(Gray Top)Covid-19 Tri-Sucrose Vaccine 10/22/2020   PFIZER(Purple Top)SARS-COV-2 Vaccination 08/03/2019, 08/25/2019, 04/17/2020   Pfizer Covid-19 Vaccine Bivalent Booster 70yrs & up 04/23/2021   Pneumococcal Conjugate-13 01/16/2014   Pneumococcal Polysaccharide-23 03/24/2012, 08/22/2018   Tdap 03/24/2012   Zoster, Live 08/18/2012    TDAP status: Up to date  Flu Vaccine status: Up to date  Pneumococcal vaccine status: Up to date  Covid-19 vaccine status: Information provided on how to obtain vaccines.   Qualifies for Shingles Vaccine? Yes   Zostavax completed Yes   Shingrix Completed?: No.    Education has been provided regarding the importance of this vaccine. Patient has been advised to call insurance company to determine out of pocket expense if they have not yet received this vaccine. Advised may also receive vaccine at local pharmacy or Health Dept. Verbalized acceptance and understanding.  Screening Tests Health Maintenance  Topic Date Due   Zoster Vaccines- Shingrix (1 of 2) Never done   TETANUS/TDAP  03/24/2022   COLONOSCOPY (Pts 45-56yrs Insurance coverage will need to be confirmed)  05/03/2023   Pneumonia Vaccine 32+ Years old  Completed   INFLUENZA VACCINE  Completed  DEXA SCAN  Completed   COVID-19 Vaccine  Completed   Hepatitis C Screening  Completed   HPV VACCINES  Aged Out    Health Maintenance  Health Maintenance Due  Topic Date Due   Zoster Vaccines- Shingrix (1 of 2) Never done    Colorectal cancer screening: Type of screening: Colonoscopy. Completed  . Repeat every 0 years  Mammogram status: Completed  . Repeat every year  Bone Density status: Ordered  . Pt provided with contact info and advised to call to schedule appt.  Lung Cancer Screening: (Low Dose CT Chest recommended if Age 77-80 years, 30 pack-year  currently smoking OR have quit w/in 15years.) does not qualify.   Lung Cancer Screening Referral:   Additional Screening:  Hepatitis C Screening: does not qualify; Completed 2017  Vision Screening: Recommended annual ophthalmology exams for early detection of glaucoma and other disorders of the eye. Is the patient up to date with their annual eye exam?  Yes  Who is the provider or what is the name of the office in which the patient attends annual eye exams? Staff If pt is not established with a provider, would they like to be referred to a provider to establish care? No .   Dental Screening: Recommended annual dental exams for proper oral hygiene  Community Resource Referral / Chronic Care Management: CRR required this visit?  No   CCM required this visit?  No      Plan:     I have personally reviewed and noted the following in the patients chart:   Medical and social history Use of alcohol, tobacco or illicit drugs  Current medications and supplements including opioid prescriptions.  Functional ability and status Nutritional status Physical activity Advanced directives List of other physicians Hospitalizations, surgeries, and ER visits in previous 12 months Vitals Screenings to include cognitive, depression, and falls Referrals and appointments  In addition, I have reviewed and discussed with patient certain preventive protocols, quality metrics, and best practice recommendations. A written personalized care plan for preventive services as well as general preventive health recommendations were provided to patient.     Leroy Kennedy, LPN   0/07/7791   Nurse Notes:

## 2021-09-13 ENCOUNTER — Other Ambulatory Visit: Payer: Self-pay | Admitting: Internal Medicine

## 2021-09-13 DIAGNOSIS — E039 Hypothyroidism, unspecified: Secondary | ICD-10-CM

## 2021-10-07 ENCOUNTER — Other Ambulatory Visit: Payer: Self-pay | Admitting: *Deleted

## 2021-10-07 DIAGNOSIS — D509 Iron deficiency anemia, unspecified: Secondary | ICD-10-CM

## 2021-10-10 DIAGNOSIS — Z20822 Contact with and (suspected) exposure to covid-19: Secondary | ICD-10-CM | POA: Diagnosis not present

## 2021-10-13 ENCOUNTER — Inpatient Hospital Stay: Payer: Medicare Other | Attending: Nurse Practitioner

## 2021-10-13 DIAGNOSIS — R5383 Other fatigue: Secondary | ICD-10-CM | POA: Insufficient documentation

## 2021-10-13 DIAGNOSIS — D509 Iron deficiency anemia, unspecified: Secondary | ICD-10-CM | POA: Insufficient documentation

## 2021-10-13 DIAGNOSIS — E039 Hypothyroidism, unspecified: Secondary | ICD-10-CM | POA: Insufficient documentation

## 2021-10-13 DIAGNOSIS — Z85820 Personal history of malignant melanoma of skin: Secondary | ICD-10-CM | POA: Diagnosis not present

## 2021-10-13 LAB — CBC WITH DIFFERENTIAL/PLATELET
Abs Immature Granulocytes: 0.01 10*3/uL (ref 0.00–0.07)
Basophils Absolute: 0 10*3/uL (ref 0.0–0.1)
Basophils Relative: 1 %
Eosinophils Absolute: 0.2 10*3/uL (ref 0.0–0.5)
Eosinophils Relative: 2 %
HCT: 36.9 % (ref 36.0–46.0)
Hemoglobin: 11.3 g/dL — ABNORMAL LOW (ref 12.0–15.0)
Immature Granulocytes: 0 %
Lymphocytes Relative: 19 %
Lymphs Abs: 1.3 10*3/uL (ref 0.7–4.0)
MCH: 27.7 pg (ref 26.0–34.0)
MCHC: 30.6 g/dL (ref 30.0–36.0)
MCV: 90.4 fL (ref 80.0–100.0)
Monocytes Absolute: 0.5 10*3/uL (ref 0.1–1.0)
Monocytes Relative: 7 %
Neutro Abs: 4.7 10*3/uL (ref 1.7–7.7)
Neutrophils Relative %: 71 %
Platelets: 162 10*3/uL (ref 150–400)
RBC: 4.08 MIL/uL (ref 3.87–5.11)
RDW: 14.2 % (ref 11.5–15.5)
WBC: 6.6 10*3/uL (ref 4.0–10.5)
nRBC: 0 % (ref 0.0–0.2)

## 2021-10-13 LAB — IRON AND TIBC
Iron: 24 ug/dL — ABNORMAL LOW (ref 28–170)
Saturation Ratios: 7 % — ABNORMAL LOW (ref 10.4–31.8)
TIBC: 344 ug/dL (ref 250–450)
UIBC: 320 ug/dL

## 2021-10-13 LAB — FERRITIN: Ferritin: 41 ng/mL (ref 11–307)

## 2021-10-15 ENCOUNTER — Other Ambulatory Visit: Payer: Self-pay

## 2021-10-15 ENCOUNTER — Encounter: Payer: Self-pay | Admitting: Nurse Practitioner

## 2021-10-15 ENCOUNTER — Inpatient Hospital Stay: Payer: Medicare Other

## 2021-10-15 ENCOUNTER — Inpatient Hospital Stay (HOSPITAL_BASED_OUTPATIENT_CLINIC_OR_DEPARTMENT_OTHER): Payer: Medicare Other | Admitting: Nurse Practitioner

## 2021-10-15 VITALS — BP 127/68 | HR 63 | Temp 97.8°F | Resp 18

## 2021-10-15 VITALS — BP 117/70 | HR 62 | Temp 97.7°F | Ht 62.5 in | Wt 207.2 lb

## 2021-10-15 DIAGNOSIS — Z85820 Personal history of malignant melanoma of skin: Secondary | ICD-10-CM | POA: Diagnosis not present

## 2021-10-15 DIAGNOSIS — D509 Iron deficiency anemia, unspecified: Secondary | ICD-10-CM

## 2021-10-15 DIAGNOSIS — D508 Other iron deficiency anemias: Secondary | ICD-10-CM

## 2021-10-15 DIAGNOSIS — E039 Hypothyroidism, unspecified: Secondary | ICD-10-CM | POA: Diagnosis not present

## 2021-10-15 DIAGNOSIS — R5383 Other fatigue: Secondary | ICD-10-CM | POA: Diagnosis not present

## 2021-10-15 MED ORDER — SODIUM CHLORIDE 0.9 % IV SOLN
510.0000 mg | Freq: Once | INTRAVENOUS | Status: AC
  Administered 2021-10-15: 510 mg via INTRAVENOUS
  Filled 2021-10-15: qty 510

## 2021-10-15 NOTE — Progress Notes (Signed)
?[place]  ?[phone]  [phone]  ? ? ? ?Janet Rice DOB: 12/05/1945  MR#: [id number]  MPN#:[id number] ? ?Patient Care Team: ?Jon Billings, NP as PCP - General (Nurse Practitioner) ?Lloyd Huger, MD as Consulting Physician (Hematology and Oncology) ? ? ?CHIEF COMPLAINT: Iron deficiency anemia. ? ?INTERVAL HISTORY: Patient returns to clinic for consideartion of Iv iron and discussion of lab results. She felt energy improved after feraheme x 2 but past couple of weeks has dipped again. Reports fatigue. She has no neurologic complaints. She denies any recent fevers or illnesses. She has a good appetite and denies weight loss.  She denies any chest pain, shortness of breath, cough, or hemoptysis.  She denies any nausea, vomiting, constipation, or diarrhea. She has no melena or hematochezia. She has no urinary complaints.  Patient offers no further specific complaints today. ? ?REVIEW OF SYSTEMS:   ?Review of Systems  ?Constitutional:  Positive for malaise/fatigue. Negative for fever and weight loss.  ?Eyes: Negative.   ?Respiratory: Negative.  Negative for cough and shortness of breath.   ?Cardiovascular: Negative.  Negative for chest pain and leg swelling.  ?Gastrointestinal: Negative.  Negative for abdominal pain, blood in stool and melena.  ?Genitourinary: Negative.  Negative for hematuria.  ?Musculoskeletal: Negative.  Negative for back pain and joint pain.  ?Skin: Negative.  Negative for rash.  ?Neurological:  Negative for dizziness, sensory change, focal weakness and weakness.  ?Psychiatric/Behavioral: Negative.  The patient is not nervous/anxious.   ?As per HPI. Otherwise, a complete review of systems is negative. ? ?ONCOLOGY HISTORY: ?Oncology History  ? No history exists.  ? ? ?PAST MEDICAL HISTORY: ?Past Medical History:  ?Diagnosis Date  ? Adenomatous colon polyp   ? Anemia   ? Arthritis   ? Basal cell carcinoma   ? left supratip of nose nodular edges  involved 08/27/20 Dr. Kellie Moor, left postauricular sulcus nodular bcc 01/02/20 exc 01/23/20 neg margins  ? Blood transfusion without reported diagnosis   ? Cataract   ? Childhood asthma   ? AS A CHILD  ? Chronic heartburn   ? On omeprazole  ? Complication of anesthesia   ? HARD TO WAKE UP  ? Dyspnea   ? low hgb. and iron  chronic problem  ? External hemorrhoids   ? Frequency of urination   ? Frequent loose stools   ? GERD (gastroesophageal reflux disease)   ? Heme positive stool   ? History of hiatal hernia   ? large  ? Hyperlipidemia   ? Hypothyroidism   ? Iron deficiency anemia   ? Melanoma ([place])   ? left thigh s/p LN removal left groin  ? Metastatic melanoma ([place])   ? Followed by Dr Oliva Bustard, previous chemo, no recurrence, 2008?  ? Multinodular goiter   ? Osteoporosis   ? SCC (squamous cell carcinoma)   ? skin  ? ? ?PAST SURGICAL HISTORY: ?Past Surgical History:  ?Procedure Laterality Date  ? ABDOMINAL HYSTERECTOMY  1984  ? BACK SURGERY  03/2016  ? LUMBAR  ? CARPAL TUNNEL RELEASE Right 09/07/2016  ? Procedure: CARPAL TUNNEL RELEASE;  Surgeon: Thornton Park, MD;  Location: [facility] ORS;  Service: Orthopedics;  Laterality: Right;  ? CHOLECYSTECTOMY  1990  ? COLONOSCOPY  08/2006  ? Four sessile polyps found and removed in sigmoid colon at splenic flexure and ascending colon. 7 mm in size. Another removed from transverse colon 4 mm. Path Report - showed tubular adenoma and hyperplastic polyp. Advised to  repeat in 3.5 years (02/2010)  ? COLONOSCOPY  3.2.2011  ? 8 mm polyp in sigmoid colon, removed, 4 mm polyp in descending colon, removed. Internal hemorrhoids  ? ESOPHAGOGASTRODUODENOSCOPY  3.2.2011  ? Large hiatia hernia, esophagus normal, mulitple small sessile polyps w/no stigmata of recent bleeding found. Mildy erythermatous mucosa w/no bleeding found in gatric antrum. Normal duodenum. 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 PATH - no active inflammation, antral mucosa w/mild foveolar hyperplasia  ? FRACTURE  SURGERY    ? Lymph Node Removal  12/2005  ? Left inguinal lymph node dissection  ? MELANOMA EXCISION Left 1993  ? Left thigh  ? ORIF ANKLE FRACTURE Right   ? Dr. Mack Guise  ? TOTAL HIP ARTHROPLASTY Right 11/18/2016  ? Procedure: TOTAL HIP ARTHROPLASTY;  Surgeon: Thornton Park, MD;  Location: [facility] ORS;  Service: Orthopedics;  Laterality: Right;  ? TOTAL HIP ARTHROPLASTY    ? left  ? ? ?FAMILY HISTORY ?Family History  ?Problem Relation Age of Onset  ? Aneurysm Mother   ? Heart disease Mother   ? Colon polyps Father   ? Diabetes Father   ? Dementia Father   ? Stroke Father   ?     TIA  ? Macular degeneration Father   ? Hypothyroidism Daughter   ? Hypothyroidism Son   ? Leukemia Maternal Grandfather   ? Skin cancer Paternal Grandfather   ? Diabetes Other   ? Colon polyps Other   ? Colon cancer Neg Hx   ? Breast cancer Neg Hx   ? Stomach cancer Neg Hx   ? Rectal cancer Neg Hx   ? Esophageal cancer Neg Hx   ? ? ?GYNECOLOGIC HISTORY:  No LMP recorded. Patient has had a hysterectomy.  ? ?  ?ADVANCED DIRECTIVES:  ? ? ?HEALTH MAINTENANCE: ?Social History  ? ?Tobacco Use  ? Smoking status: Former  ?  Packs/day: 0.50  ?  Years: 8.00  ?  Pack years: 4.00  ?  Types: Cigarettes  ?  Quit date: 07/12/1978  ?  Years since quitting: 43.2  ? Smokeless tobacco: Never  ?Vaping Use  ? Vaping Use: Never used  ?Substance Use Topics  ? Alcohol use: No  ?  Alcohol/week: 0.0 standard drinks  ? Drug use: No  ? ? ?Allergies  ?Allergen Reactions  ? Sulfa Antibiotics Rash  ? ? ?Current Outpatient Medications  ?Medication Sig Dispense Refill  ? acetaminophen (TYLENOL) 650 MG CR tablet Take 1,300 mg by mouth daily.    ? B Complex Vitamins (B COMPLEX PO) Take by mouth.    ? Calcium Carbonate-Vitamin D 600-400 MG-UNIT tablet Take by mouth.    ? citalopram (CELEXA) 20 MG tablet Take 1 tablet (20 mg total) by mouth daily. 90 tablet 3  ? levothyroxine (SYNTHROID) 125 MCG tablet TAKE 1 TABLET DAILY BEFORE BREAKFAST AND 1/2 TABLET ONSUNDAY (THIS IS CHANGE)  84 tablet 3  ? loperamide (IMODIUM) 2 MG capsule Take 2 mg by mouth every morning.    ? Multiple Vitamins-Minerals (EYE VITAMINS & MINERALS) TABS Take 1 tablet by mouth daily.    ? pravastatin (PRAVACHOL) 40 MG tablet Take 0.5 tablets (20 mg total) by mouth daily. 45 tablet 3  ? celecoxib (CELEBREX) 200 MG capsule     ? Ferrous Sulfate Dried 45 MG TBCR Take 45 mg by mouth daily with lunch. (Patient not taking: Reported on 07/15/2021)    ? NON FORMULARY Apply 1 application topically 2 (two) times daily as needed (muscle  pain). Horse Liniment    ? ?No current facility-administered medications for this visit.  ? ?Facility-Administered Medications Ordered in Other Visits  ?Medication Dose Route Frequency Provider Last Rate Last Admin  ? 0.9 %  sodium chloride infusion   Intravenous PRN Lloyd Huger, MD   Stopped at 03/13/21 0940  ? ? ?OBJECTIVE: ?BP 117/70 (BP Location: Left Arm, Patient Position: Sitting, Cuff Size: Large)   Pulse 62   Temp 97.7 ?F (36.5 ?C) (Tympanic)   Ht 5' 2.5" (1.588 m)   Wt 207 lb 3.2 oz (94 kg)   SpO2 97%   BMI 37.29 kg/m?    Body mass index is 37.29 kg/m?Marland Kitchen    ECOG FS:1 - Symptomatic but completely ambulatory ? ?General: Well-developed, well-nourished, no acute distress. ?Eyes: Pink conjunctiva, anicteric sclera. ?Lungs: Clear to auscultation bilaterally.  No audible wheezing or coughing ?Heart: Regular rate and rhythm.  ?Abdomen: Soft, nontender, nondistended.  ?Musculoskeletal: No edema, cyanosis, or clubbing. ?Neuro: Alert, answering all questions appropriately. Cranial nerves grossly intact. ?Skin: No rashes or petechiae noted. ?Psych: Normal affect. ? ? ?LAB RESULTS: ? ?CBC ?   ?Component Value Date/Time  ? WBC 6.6 10/13/2021 1414  ? RBC 4.08 10/13/2021 1414  ? HGB 11.3 (L) 10/13/2021 1414  ? HGB 9.1 (L) 09/03/2020 0951  ? HCT 36.9 10/13/2021 1414  ? HCT 29.5 (L) 09/03/2020 0951  ? PLT 162 10/13/2021 1414  ? PLT 153 09/03/2020 0951  ? MCV 90.4 10/13/2021 1414  ? MCV 90 09/03/2020  0951  ? MCV 74 (L) 12/12/2013 1015  ? MCH 27.7 10/13/2021 1414  ? MCHC 30.6 10/13/2021 1414  ? RDW 14.2 10/13/2021 1414  ? RDW 14.5 09/03/2020 0951  ? RDW 33.6 (H) 12/12/2013 1015  ? LYMPHSABS 1.3 04/04/202

## 2021-10-15 NOTE — Patient Instructions (Signed)
[facility]  Discharge Instructions: ?Thank you for choosing [facility] to provide your oncology and hematology care.  ?If you have a lab appointment with the Cancer Center, please go directly to the Cancer Center and check in at the registration area. ? ?Wear comfortable clothing and clothing appropriate for easy access to any Portacath or PICC line.  ? ?We strive to give you quality time with your provider. You may need to reschedule your appointment if you arrive late (15 or more minutes).  Arriving late affects you and other patients whose appointments are after yours.  Also, if you miss three or more appointments without notifying the office, you may be dismissed from the clinic at the provider?s discretion.    ?  ?For prescription refill requests, have your pharmacy contact our office and allow 72 hours for refills to be completed.   ? ?Today you received the following chemotherapy and/or immunotherapy agents FERAHEME    ?  ?To help prevent nausea and vomiting after your treatment, we encourage you to take your nausea medication as directed. ? ?BELOW ARE SYMPTOMS THAT SHOULD BE REPORTED IMMEDIATELY: ?*FEVER GREATER THAN 100.4 F (38 ?C) OR HIGHER ?*CHILLS OR SWEATING ?*NAUSEA AND VOMITING THAT IS NOT CONTROLLED WITH YOUR NAUSEA MEDICATION ?*UNUSUAL SHORTNESS OF BREATH ?*UNUSUAL BRUISING OR BLEEDING ?*URINARY PROBLEMS (pain or burning when urinating, or frequent urination) ?*BOWEL PROBLEMS (unusual diarrhea, constipation, pain near the anus) ?TENDERNESS IN MOUTH AND THROAT WITH OR WITHOUT PRESENCE OF ULCERS (sore throat, sores in mouth, or a toothache) ?UNUSUAL RASH, SWELLING OR PAIN  ?UNUSUAL VAGINAL DISCHARGE OR ITCHING  ? ?Items with * indicate a potential emergency and should be followed up as soon as possible or go to the Emergency Department if any problems should occur. ? ?Please show the CHEMOTHERAPY ALERT CARD or IMMUNOTHERAPY ALERT CARD at check-in to  the Emergency Department and triage nurse. ? ?Should you have questions after your visit or need to cancel or reschedule your appointment, please contact [facility]  [phone] and follow the prompts.  Office hours are 8:00 a.m. to 4:30 p.m. Monday - Friday. Please note that voicemails left after 4:00 p.m. may not be returned until the following business day.  We are closed weekends and major holidays. You have access to a nurse at all times for urgent questions. Please call the main number to the clinic [phone] and follow the prompts. ? ?For any non-urgent questions, you may also contact your provider using MyChart. We now offer e-Visits for anyone 18 and older to request care online for non-urgent symptoms. For details visit [email or web address]. ?  ?Also download the MyChart app! Go to the app store, search "MyChart", open the app, select [facility], and log in with your MyChart username and password. ? ?Due to Covid, a mask is required upon entering the hospital/clinic. If you do not have a mask, one will be given to you upon arrival. For doctor visits, patients may have 1 support person aged 18 or older with them. For treatment visits, patients cannot have anyone with them due to current Covid guidelines and our immunocompromised population.  ? ? ?Ferumoxytol Injection ?What is this medication? ?FERUMOXYTOL (FER ue MOX i tol) treats low levels of iron in your body (iron deficiency anemia). Iron is a mineral that plays an important role in making red blood cells, which carry oxygen from your lungs to the rest of your body. ?This medicine may be used   for other purposes; ask your health care provider or pharmacist if you have questions. ?COMMON BRAND NAME(S): Feraheme ?What should I tell my care team before I take this medication? ?They need to know if you have any of these conditions: ?Anemia not caused by low iron levels ?High levels of iron in the blood ?Magnetic  resonance imaging (MRI) test scheduled ?An unusual or allergic reaction to iron, other medications, foods, dyes, or preservatives ?Pregnant or trying to get pregnant ?Breast-feeding ?How should I use this medication? ?This medication is for injection into a vein. It is given in a hospital or clinic setting. ?Talk to your care team the use of this medication in children. Special care may be needed. ?Overdosage: If you think you have taken too much of this medicine contact a poison control center or emergency room at once. ?NOTE: This medicine is only for you. Do not share this medicine with others. ?What if I miss a dose? ?It is important not to miss your dose. Call your care team if you are unable to keep an appointment. ?What may interact with this medication? ?Other iron products ?This list may not describe all possible interactions. Give your health care provider a list of all the medicines, herbs, non-prescription drugs, or dietary supplements you use. Also tell them if you smoke, drink alcohol, or use illegal drugs. Some items may interact with your medicine. ?What should I watch for while using this medication? ?Visit your care team regularly. Tell your care team if your symptoms do not start to get better or if they get worse. You may need blood work done while you are taking this medication. ?You may need to follow a special diet. Talk to your care team. Foods that contain iron include: whole grains/cereals, dried fruits, beans, or peas, leafy green vegetables, and organ meats (liver, kidney). ?What side effects may I notice from receiving this medication? ?Side effects that you should report to your care team as soon as possible: ?Allergic reactions--skin rash, itching, hives, swelling of the face, lips, tongue, or throat ?Low blood pressure--dizziness, feeling faint or lightheaded, blurry vision ?Shortness of breath ?Side effects that usually do not require medical attention (report to your care team if  they continue or are bothersome): ?Flushing ?Headache ?Joint pain ?Muscle pain ?Nausea ?Pain, redness, or irritation at injection site ?This list may not describe all possible side effects. Call your doctor for medical advice about side effects. You may report side effects to FDA at 1-800-FDA-1088. ?Where should I keep my medication? ?This medication is given in a hospital or clinic and will not be stored at home. ?NOTE: This sheet is a summary. It may not cover all possible information. If you have questions about this medicine, talk to your doctor, pharmacist, or health care provider. ?? 2022 Elsevier/Gold Standard (2020-11-21 00:00:00) ? ?

## 2021-10-16 DIAGNOSIS — Z20822 Contact with and (suspected) exposure to covid-19: Secondary | ICD-10-CM | POA: Diagnosis not present

## 2021-10-26 DIAGNOSIS — Z20822 Contact with and (suspected) exposure to covid-19: Secondary | ICD-10-CM | POA: Diagnosis not present

## 2021-11-02 DIAGNOSIS — M1711 Unilateral primary osteoarthritis, right knee: Secondary | ICD-10-CM | POA: Diagnosis not present

## 2021-11-12 ENCOUNTER — Encounter: Payer: Self-pay | Admitting: Nurse Practitioner

## 2021-11-16 DIAGNOSIS — Z20822 Contact with and (suspected) exposure to covid-19: Secondary | ICD-10-CM | POA: Diagnosis not present

## 2021-11-18 ENCOUNTER — Ambulatory Visit (INDEPENDENT_AMBULATORY_CARE_PROVIDER_SITE_OTHER): Payer: Medicare Other | Admitting: Nurse Practitioner

## 2021-11-18 ENCOUNTER — Encounter: Payer: Self-pay | Admitting: Nurse Practitioner

## 2021-11-18 VITALS — BP 135/67 | HR 76 | Temp 98.4°F | Wt 206.6 lb

## 2021-11-18 DIAGNOSIS — D509 Iron deficiency anemia, unspecified: Secondary | ICD-10-CM

## 2021-11-18 DIAGNOSIS — F419 Anxiety disorder, unspecified: Secondary | ICD-10-CM | POA: Diagnosis not present

## 2021-11-18 DIAGNOSIS — E785 Hyperlipidemia, unspecified: Secondary | ICD-10-CM

## 2021-11-18 DIAGNOSIS — E039 Hypothyroidism, unspecified: Secondary | ICD-10-CM | POA: Diagnosis not present

## 2021-11-18 DIAGNOSIS — F32A Depression, unspecified: Secondary | ICD-10-CM

## 2021-11-18 NOTE — Assessment & Plan Note (Signed)
Chronic.  Controlled.  Continue with current medication regimen on levothyroxine 125mcg daily.  Labs ordered today.  Return to clinic in 6 months for reevaluation.  Call sooner if concerns arise.   

## 2021-11-18 NOTE — Assessment & Plan Note (Signed)
Chronic.  Controlled.  Continue with current medication regimen on pravastatin '20mg'$ .  Labs ordered today.  Return to clinic in 6 months for reevaluation.  Call sooner if concerns arise.  ? ?

## 2021-11-18 NOTE — Progress Notes (Signed)
? ?BP 135/67   Pulse 76   Temp 98.4 ?F (36.9 ?C) (Oral)   Wt 206 lb 9.6 oz (93.7 kg)   SpO2 90%   BMI 37.19 kg/m?   ? ?Subjective:  ? ? Patient ID: Janet Rice, female    DOB: 1945/10/01, 76 y.o.   MRN: [id number] ? ?HPI: ?Janet Rice is a 76 y.o. female ? ?Chief Complaint  ?Patient presents with  ? Hypertension  ? Hyperlipidemia  ? Diabetes  ? ?HYPOTHYROIDISM ?Thyroid control status:controlled ?Satisfied with current treatment? yes ?Medication side effects: no ?Medication compliance: excellent compliance ?Etiology of hypothyroidism:  ?Recent dose adjustment:no ?Fatigue: no ?Cold intolerance: no ?Heat intolerance: no ?Weight gain: no ?Weight loss: no ?Constipation: no ?Diarrhea/loose stools: no ?Palpitations: no ?Lower extremity edema: no ?Anxiety/depressed mood: no ? ? ?HYPERLIPIDEMIA ?Hyperlipidemia status: excellent compliance ?Satisfied with current treatment?  no ?Side effects:  no ?Medication compliance: excellent compliance ?Past cholesterol meds: pravastatin (pravachol) ?Supplements: none ?Aspirin:  no ?The 10-year ASCVD risk score (Arnett DK, et al., 2019) is: 17.4% ?  Values used to calculate the score: ?    Age: 72 years ?    Sex: Female ?    Is Non-Hispanic African American: No ?    Diabetic: No ?    Tobacco smoker: No ?    Systolic Blood Pressure: 027 mmHg ?    Is BP treated: No ?    HDL Cholesterol: 52.1 mg/dL ?    Total Cholesterol: 176 mg/dL ?Chest pain:  no ?Coronary artery disease:  no ?Family history CAD:  no ?Family history early CAD:  no ? ?ANEMIA ?Followed by Hematology.  Receives PRN iron infusions. ? ? ?MOOD ?Feels like her mood is good.  Celexa is working well for her.  ? ?Relevant past medical, surgical, family and social history reviewed and updated as indicated. Interim medical history since our last visit reviewed. ?Allergies and medications reviewed and updated. ? ?Review of Systems  ?Constitutional:  Negative for fatigue and unexpected weight change.  ?Eyes:  Negative  for visual disturbance.  ?Respiratory:  Negative for cough, chest tightness and shortness of breath.   ?Cardiovascular:  Negative for chest pain, palpitations and leg swelling.  ?Gastrointestinal:  Negative for constipation and diarrhea.  ?Endocrine: Negative for cold intolerance and heat intolerance.  ?Neurological:  Negative for dizziness and headaches.  ?Psychiatric/Behavioral:  Negative for dysphoric mood. The patient is not nervous/anxious.   ? ?Per HPI unless specifically indicated above ? ?   ?Objective:  ?  ?BP 135/67   Pulse 76   Temp 98.4 ?F (36.9 ?C) (Oral)   Wt 206 lb 9.6 oz (93.7 kg)   SpO2 90%   BMI 37.19 kg/m?   ?Wt Readings from Last 3 Encounters:  ?11/18/21 206 lb 9.6 oz (93.7 kg)  ?10/15/21 207 lb 3.2 oz (94 kg)  ?07/15/21 205 lb 4.8 oz (93.1 kg)  ?  ?Physical Exam ?Vitals and nursing note reviewed.  ?Constitutional:   ?   General: She is not in acute distress. ?   Appearance: Normal appearance. She is obese. She is not ill-appearing, toxic-appearing or diaphoretic.  ?HENT:  ?   Head: Normocephalic.  ?   Right Ear: External ear normal.  ?   Left Ear: External ear normal.  ?   Nose: Nose normal.  ?   Mouth/Throat:  ?   Mouth: Mucous membranes are moist.  ?   Pharynx: Oropharynx is clear.  ?Eyes:  ?   General:     ?  Right eye: No discharge.     ?   Left eye: No discharge.  ?   Extraocular Movements: Extraocular movements intact.  ?   Conjunctiva/sclera: Conjunctivae normal.  ?   Pupils: Pupils are equal, round, and reactive to light.  ?Cardiovascular:  ?   Rate and Rhythm: Normal rate and regular rhythm.  ?   Heart sounds: No murmur heard. ?Pulmonary:  ?   Effort: Pulmonary effort is normal. No respiratory distress.  ?   Breath sounds: Normal breath sounds. No wheezing or rales.  ?Musculoskeletal:  ?   Cervical back: Normal range of motion and neck supple.  ?Skin: ?   General: Skin is warm and dry.  ?   Capillary Refill: Capillary refill takes less than 2 seconds.  ?Neurological:  ?   General:  No focal deficit present.  ?   Mental Status: She is alert and oriented to person, place, and time. Mental status is at baseline.  ?Psychiatric:     ?   Mood and Affect: Mood normal.     ?   Behavior: Behavior normal.     ?   Thought Content: Thought content normal.     ?   Judgment: Judgment normal.  ? ? ?Results for orders placed or performed in visit on 10/13/21  ?CBC with Differential  ?Result Value Ref Range  ? WBC 6.6 4.0 - 10.5 K/uL  ? RBC 4.08 3.87 - 5.11 MIL/uL  ? Hemoglobin 11.3 (L) 12.0 - 15.0 g/dL  ? HCT 36.9 36.0 - 46.0 %  ? MCV 90.4 80.0 - 100.0 fL  ? MCH 27.7 26.0 - 34.0 pg  ? MCHC 30.6 30.0 - 36.0 g/dL  ? RDW 14.2 11.5 - 15.5 %  ? Platelets 162 150 - 400 K/uL  ? nRBC 0.0 0.0 - 0.2 %  ? Neutrophils Relative % 71 %  ? Neutro Abs 4.7 1.7 - 7.7 K/uL  ? Lymphocytes Relative 19 %  ? Lymphs Abs 1.3 0.7 - 4.0 K/uL  ? Monocytes Relative 7 %  ? Monocytes Absolute 0.5 0.1 - 1.0 K/uL  ? Eosinophils Relative 2 %  ? Eosinophils Absolute 0.2 0.0 - 0.5 K/uL  ? Basophils Relative 1 %  ? Basophils Absolute 0.0 0.0 - 0.1 K/uL  ? Immature Granulocytes 0 %  ? Abs Immature Granulocytes 0.01 0.00 - 0.07 K/uL  ?Iron and TIBC([facility])  ?Result Value Ref Range  ? Iron 24 (L) 28 - 170 ug/dL  ? TIBC 344 250 - 450 ug/dL  ? Saturation Ratios 7 (L) 10.4 - 31.8 %  ? UIBC 320 ug/dL  ?Ferritin  ?Result Value Ref Range  ? Ferritin 41 11 - 307 ng/mL  ? ?   ?Assessment & Plan:  ? ?Problem List Items Addressed This Visit   ? ?  ? Endocrine  ? Hypothyroidism - Primary  ?  Chronic.  Controlled.  Continue with current medication regimen on levothyroxine 113mg daily.  Labs ordered today.  Return to clinic in 6 months for reevaluation.  Call sooner if concerns arise.  ? ? ?  ?  ? Relevant Orders  ? TSH  ? T4, free  ?  ? Other  ? Iron deficiency anemia  ?  Chronic. Controlled with PRN iron infusions.  Follow by Hematology. Continue to follow per their recommendations. ? ?  ?  ? Hyperlipidemia  ?  Chronic.  Controlled.  Continue with  current medication regimen on pravastatin 260m  Labs ordered today.  Return  to clinic in 6 months for reevaluation.  Call sooner if concerns arise.  ? ? ?  ?  ? Relevant Orders  ? Lipid Profile  ? Anxiety and depression  ?  Chronic.  Controlled.  Continue with current medication regimen on Celexa 65m.  Labs ordered today. Doing well on current dose of medication.  Return to clinic in 6 months for reevaluation.  Call sooner if concerns arise.  ? ? ?  ?  ? Relevant Orders  ? Comp Met (CMET)  ?  ? ?Follow up plan: ?Return in about 6 months (around 05/21/2022) for HTN, HLD, DM2 FU. ? ? ? ? ? ?

## 2021-11-18 NOTE — Assessment & Plan Note (Signed)
Chronic.  Controlled.  Continue with current medication regimen on Celexa '20mg'$ .  Labs ordered today. Doing well on current dose of medication.  Return to clinic in 6 months for reevaluation.  Call sooner if concerns arise.  ? ?

## 2021-11-18 NOTE — Assessment & Plan Note (Signed)
Chronic. Controlled with PRN iron infusions.  Follow by Hematology. Continue to follow per their recommendations. ?

## 2021-11-19 DIAGNOSIS — Z20822 Contact with and (suspected) exposure to covid-19: Secondary | ICD-10-CM | POA: Diagnosis not present

## 2021-11-19 LAB — COMPREHENSIVE METABOLIC PANEL
ALT: 33 IU/L — ABNORMAL HIGH (ref 0–32)
AST: 37 IU/L (ref 0–40)
Albumin/Globulin Ratio: 1.5 (ref 1.2–2.2)
Albumin: 4.4 g/dL (ref 3.7–4.7)
Alkaline Phosphatase: 121 IU/L (ref 44–121)
BUN/Creatinine Ratio: 12 (ref 12–28)
BUN: 13 mg/dL (ref 8–27)
Bilirubin Total: 0.3 mg/dL (ref 0.0–1.2)
CO2: 25 mmol/L (ref 20–29)
Calcium: 10.1 mg/dL (ref 8.7–10.3)
Chloride: 97 mmol/L (ref 96–106)
Creatinine, Ser: 1.12 mg/dL — ABNORMAL HIGH (ref 0.57–1.00)
Globulin, Total: 2.9 g/dL (ref 1.5–4.5)
Glucose: 90 mg/dL (ref 70–99)
Potassium: 4.9 mmol/L (ref 3.5–5.2)
Sodium: 138 mmol/L (ref 134–144)
Total Protein: 7.3 g/dL (ref 6.0–8.5)
eGFR: 51 mL/min/{1.73_m2} — ABNORMAL LOW (ref >59)

## 2021-11-19 LAB — T4, FREE: Free T4: 1.52 ng/dL (ref 0.82–1.77)

## 2021-11-19 LAB — TSH: TSH: 1.66 u[IU]/mL (ref 0.450–4.500)

## 2021-11-19 LAB — LIPID PANEL
Chol/HDL Ratio: 3.3 ratio (ref 0.0–4.4)
Cholesterol, Total: 213 mg/dL — ABNORMAL HIGH (ref 100–199)
HDL: 64 mg/dL (ref >39)
LDL Chol Calc (NIH): 115 mg/dL — ABNORMAL HIGH (ref 0–99)
Triglycerides: 197 mg/dL — ABNORMAL HIGH (ref 0–149)
VLDL Cholesterol Cal: 34 mg/dL (ref 5–40)

## 2021-11-19 NOTE — Progress Notes (Signed)
Hi Janet Rice.  Your lab work looks good.  Your kidney function does show some decline but we will continue to monitor it and make sure we keep it stable.  ? ?Your thyroid labs look good.  Continue with your current dose.   ? ?Your cholesterol is elevated.  Recommend following a low fat diet and exercising.

## 2022-01-18 ENCOUNTER — Inpatient Hospital Stay: Payer: Medicare Other | Attending: Nurse Practitioner

## 2022-01-18 DIAGNOSIS — D509 Iron deficiency anemia, unspecified: Secondary | ICD-10-CM | POA: Insufficient documentation

## 2022-01-18 DIAGNOSIS — Z85820 Personal history of malignant melanoma of skin: Secondary | ICD-10-CM | POA: Insufficient documentation

## 2022-01-18 DIAGNOSIS — D508 Other iron deficiency anemias: Secondary | ICD-10-CM

## 2022-01-18 LAB — CBC WITH DIFFERENTIAL/PLATELET
Abs Immature Granulocytes: 0.02 10*3/uL (ref 0.00–0.07)
Basophils Absolute: 0 10*3/uL (ref 0.0–0.1)
Basophils Relative: 1 %
Eosinophils Absolute: 0.1 10*3/uL (ref 0.0–0.5)
Eosinophils Relative: 3 %
HCT: 32.7 % — ABNORMAL LOW (ref 36.0–46.0)
Hemoglobin: 9.8 g/dL — ABNORMAL LOW (ref 12.0–15.0)
Immature Granulocytes: 0 %
Lymphocytes Relative: 15 %
Lymphs Abs: 0.9 10*3/uL (ref 0.7–4.0)
MCH: 27.5 pg (ref 26.0–34.0)
MCHC: 30 g/dL (ref 30.0–36.0)
MCV: 91.9 fL (ref 80.0–100.0)
Monocytes Absolute: 0.4 10*3/uL (ref 0.1–1.0)
Monocytes Relative: 7 %
Neutro Abs: 4.2 10*3/uL (ref 1.7–7.7)
Neutrophils Relative %: 74 %
Platelets: 166 10*3/uL (ref 150–400)
RBC: 3.56 MIL/uL — ABNORMAL LOW (ref 3.87–5.11)
RDW: 14.8 % (ref 11.5–15.5)
WBC: 5.7 10*3/uL (ref 4.0–10.5)
nRBC: 0 % (ref 0.0–0.2)

## 2022-01-18 LAB — COMPREHENSIVE METABOLIC PANEL
ALT: 27 U/L (ref 0–44)
AST: 29 U/L (ref 15–41)
Albumin: 3.9 g/dL (ref 3.5–5.0)
Alkaline Phosphatase: 82 U/L (ref 38–126)
Anion gap: 8 (ref 5–15)
BUN: 12 mg/dL (ref 8–23)
CO2: 30 mmol/L (ref 22–32)
Calcium: 9.1 mg/dL (ref 8.9–10.3)
Chloride: 95 mmol/L — ABNORMAL LOW (ref 98–111)
Creatinine, Ser: 1.02 mg/dL — ABNORMAL HIGH (ref 0.44–1.00)
GFR, Estimated: 57 mL/min — ABNORMAL LOW (ref >60)
Glucose, Bld: 91 mg/dL (ref 70–99)
Potassium: 4.5 mmol/L (ref 3.5–5.1)
Sodium: 133 mmol/L — ABNORMAL LOW (ref 135–145)
Total Bilirubin: 0.4 mg/dL (ref 0.3–1.2)
Total Protein: 6.9 g/dL (ref 6.5–8.1)

## 2022-01-18 LAB — VITAMIN B12: Vitamin B-12: 940 pg/mL — ABNORMAL HIGH (ref 180–914)

## 2022-01-18 LAB — IRON AND TIBC
Iron: 27 ug/dL — ABNORMAL LOW (ref 28–170)
Saturation Ratios: 8 % — ABNORMAL LOW (ref 10.4–31.8)
TIBC: 350 ug/dL (ref 250–450)
UIBC: 323 ug/dL

## 2022-01-18 LAB — FERRITIN: Ferritin: 23 ng/mL (ref 11–307)

## 2022-01-19 ENCOUNTER — Inpatient Hospital Stay: Payer: Medicare Other

## 2022-01-19 ENCOUNTER — Encounter: Payer: Self-pay | Admitting: Oncology

## 2022-01-19 ENCOUNTER — Inpatient Hospital Stay (HOSPITAL_BASED_OUTPATIENT_CLINIC_OR_DEPARTMENT_OTHER): Payer: Medicare Other | Admitting: Oncology

## 2022-01-19 VITALS — BP 121/65 | HR 64 | Resp 16

## 2022-01-19 VITALS — BP 134/73 | HR 80 | Temp 97.7°F | Resp 18 | Wt 206.4 lb

## 2022-01-19 DIAGNOSIS — D509 Iron deficiency anemia, unspecified: Secondary | ICD-10-CM | POA: Diagnosis not present

## 2022-01-19 DIAGNOSIS — Z85820 Personal history of malignant melanoma of skin: Secondary | ICD-10-CM | POA: Diagnosis not present

## 2022-01-19 DIAGNOSIS — D508 Other iron deficiency anemias: Secondary | ICD-10-CM

## 2022-01-19 MED ORDER — SODIUM CHLORIDE 0.9 % IV SOLN
Freq: Once | INTRAVENOUS | Status: AC
  Filled 2022-01-19: qty 250

## 2022-01-19 MED ORDER — SODIUM CHLORIDE 0.9 % IV SOLN
510.0000 mg | Freq: Once | INTRAVENOUS | Status: AC
  Administered 2022-01-19: 510 mg via INTRAVENOUS
  Filled 2022-01-19: qty 17

## 2022-01-19 NOTE — Progress Notes (Signed)
Patient declined post observation for [facility] treatment.

## 2022-01-19 NOTE — Progress Notes (Unsigned)
[facility]  Telephone:([phone]  Fax:(336) [phone]     Janet Rice DOB: 06/14/1946  MR#: [id number]  WCH#:[id number]  Patient Care Team: Jon Billings, NP as PCP - General (Nurse Practitioner) Lloyd Huger, MD as Consulting Physician (Hematology and Oncology)   CHIEF COMPLAINT: Iron deficiency anemia.  INTERVAL HISTORY: Patient returns to clinic today for repeat laboratory work, further evaluation, and continuation of Feraheme.  She continues to have chronic weakness and fatigue, but otherwise feels well.  She has no neurologic complaints. She denies any recent fevers or illnesses. She has a good appetite and denies weight loss.  She denies any chest pain, shortness of breath, cough, or hemoptysis.  She denies any nausea, vomiting, constipation, or diarrhea. She has no melena or hematochezia. She has no urinary complaints.  Patient offers no further specific complaints today.  REVIEW OF SYSTEMS:   Review of Systems  Constitutional:  Positive for malaise/fatigue. Negative for fever and weight loss.  Eyes: Negative.   Respiratory: Negative.  Negative for cough and shortness of breath.   Cardiovascular: Negative.  Negative for chest pain and leg swelling.  Gastrointestinal: Negative.  Negative for abdominal pain, blood in stool and melena.  Genitourinary: Negative.  Negative for hematuria.  Musculoskeletal: Negative.  Negative for back pain and joint pain.  Skin: Negative.  Negative for rash.  Neurological:  Positive for weakness. Negative for dizziness, sensory change and focal weakness.  Psychiatric/Behavioral: Negative.  The patient is not nervous/anxious.     As per HPI. Otherwise, a complete review of systems is negative.  ONCOLOGY HISTORY: Oncology History   No history exists.    PAST MEDICAL HISTORY: Past Medical History:  Diagnosis Date   Adenomatous colon polyp    Anemia    Arthritis    Basal cell carcinoma    left supratip of  nose nodular edges involved 08/27/20 Dr. Kellie Moor, left postauricular sulcus nodular bcc 01/02/20 exc 01/23/20 neg margins   Blood transfusion without reported diagnosis    Cataract    Childhood asthma    AS A CHILD   Chronic heartburn    On omeprazole   Complication of anesthesia    HARD TO WAKE UP   Dyspnea    low hgb. and iron  chronic problem   External hemorrhoids    Frequency of urination    Frequent loose stools    GERD (gastroesophageal reflux disease)    Heme positive stool    History of hiatal hernia    large   Hyperlipidemia    Hypothyroidism    Iron deficiency anemia    Melanoma (HCC)    left thigh s/p LN removal left groin   Metastatic melanoma ([place])    Followed by Dr Oliva Bustard, previous chemo, no recurrence, 2008?   Multinodular goiter    Osteoporosis    SCC (squamous cell carcinoma)    skin    PAST SURGICAL HISTORY: Past Surgical History:  Procedure Laterality Date   ABDOMINAL HYSTERECTOMY  1984   BACK SURGERY  03/2016   LUMBAR   CARPAL TUNNEL RELEASE Right 09/07/2016   Procedure: CARPAL TUNNEL RELEASE;  Surgeon: Thornton Park, MD;  Location: [facility] ORS;  Service: Orthopedics;  Laterality: Right;   CHOLECYSTECTOMY  1990   COLONOSCOPY  08/2006   Four sessile polyps found and removed in sigmoid colon at splenic flexure and ascending colon. 7 mm in size. Another removed from transverse colon 4 mm. Path Report - showed tubular adenoma and hyperplastic polyp. Advised  to repeat in 3.5 years (02/2010)   COLONOSCOPY  3.2.2011   8 mm polyp in sigmoid colon, removed, 4 mm polyp in descending colon, removed. Internal hemorrhoids   ESOPHAGOGASTRODUODENOSCOPY  3.2.2011   Large hiatia hernia, esophagus normal, mulitple small sessile polyps w/no stigmata of recent bleeding found. Mildy erythermatous mucosa w/no bleeding found in gatric antrum. Normal duodenum. 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 PATH - no active inflammation, antral mucosa w/mild foveolar  hyperplasia   FRACTURE SURGERY     Lymph Node Removal  12/2005   Left inguinal lymph node dissection   MELANOMA EXCISION Left 1993   Left thigh   ORIF ANKLE FRACTURE Right    Dr. Mack Guise   TOTAL HIP ARTHROPLASTY Right 11/18/2016   Procedure: TOTAL HIP ARTHROPLASTY;  Surgeon: Thornton Park, MD;  Location: [facility] ORS;  Service: Orthopedics;  Laterality: Right;   TOTAL HIP ARTHROPLASTY     left    FAMILY HISTORY Family History  Problem Relation Age of Onset   Aneurysm Mother    Heart disease Mother    Colon polyps Father    Diabetes Father    Dementia Father    Stroke Father        TIA   Macular degeneration Father    Hypothyroidism Daughter    Hypothyroidism Son    Leukemia Maternal Grandfather    Skin cancer Paternal Grandfather    Diabetes Other    Colon polyps Other    Colon cancer Neg Hx    Breast cancer Neg Hx    Stomach cancer Neg Hx    Rectal cancer Neg Hx    Esophageal cancer Neg Hx     GYNECOLOGIC HISTORY:  No LMP recorded. Patient has had a hysterectomy.     ADVANCED DIRECTIVES:    HEALTH MAINTENANCE: Social History   Tobacco Use   Smoking status: Former    Packs/day: 0.50    Years: 8.00    Total pack years: 4.00    Types: Cigarettes    Quit date: 07/12/1978    Years since quitting: 43.5   Smokeless tobacco: Never  Vaping Use   Vaping Use: Never used  Substance Use Topics   Alcohol use: No    Alcohol/week: 0.0 standard drinks of alcohol   Drug use: No    Allergies  Allergen Reactions   Sulfa Antibiotics Rash    Current Outpatient Medications  Medication Sig Dispense Refill   acetaminophen (TYLENOL) 650 MG CR tablet Take 1,300 mg by mouth daily.     B Complex Vitamins (B COMPLEX PO) Take by mouth.     Calcium Carbonate-Vitamin D 600-400 MG-UNIT tablet Take by mouth.     citalopram (CELEXA) 20 MG tablet Take 1 tablet (20 mg total) by mouth daily. 90 tablet 3   Ferrous Sulfate Dried 45 MG TBCR Take 45 mg by mouth daily with lunch.      levothyroxine (SYNTHROID) 125 MCG tablet TAKE 1 TABLET DAILY BEFORE BREAKFAST AND 1/2 TABLET ONSUNDAY (THIS IS CHANGE) 84 tablet 3   loperamide (IMODIUM) 2 MG capsule Take 2 mg by mouth every morning.     Multiple Vitamins-Minerals (EYE VITAMINS & MINERALS) TABS Take 1 tablet by mouth daily.     pravastatin (PRAVACHOL) 40 MG tablet Take 0.5 tablets (20 mg total) by mouth daily. 45 tablet 3   No current facility-administered medications for this visit.   Facility-Administered Medications Ordered in Other Visits  Medication Dose Route Frequency Provider Last Rate Last Admin  0.9 %  sodium chloride infusion   Intravenous PRN Lloyd Huger, MD   Stopped at 03/13/21 0940    OBJECTIVE: BP 134/73   Pulse 80   Temp 97.7 F (36.5 C) (Tympanic)   Resp 18   Wt 206 lb 6.4 oz (93.6 kg)   SpO2 97%   BMI 37.15 kg/m    Body mass index is 37.15 kg/m.    ECOG FS:0 - Asymptomatic  General: Well-developed, well-nourished, no acute distress. Eyes: Pink conjunctiva, anicteric sclera. HEENT: Normocephalic, moist mucous membranes. Lungs: No audible wheezing or coughing. Heart: Regular rate and rhythm. Abdomen: Soft, nontender, no obvious distention. Musculoskeletal: No edema, cyanosis, or clubbing. Neuro: Alert, answering all questions appropriately. Cranial nerves grossly intact. Skin: No rashes or petechiae noted. Psych: Normal affect.  LAB RESULTS:  CBC    Component Value Date/Time   WBC 5.7 01/18/2022 1326   RBC 3.56 (L) 01/18/2022 1326   HGB 9.8 (L) 01/18/2022 1326   HGB 9.1 (L) 09/03/2020 0951   HCT 32.7 (L) 01/18/2022 1326   HCT 29.5 (L) 09/03/2020 0951   PLT 166 01/18/2022 1326   PLT 153 09/03/2020 0951   MCV 91.9 01/18/2022 1326   MCV 90 09/03/2020 0951   MCV 74 (L) 12/12/2013 1015   MCH 27.5 01/18/2022 1326   MCHC 30.0 01/18/2022 1326   RDW 14.8 01/18/2022 1326   RDW 14.5 09/03/2020 0951   RDW 33.6 (H) 12/12/2013 1015   LYMPHSABS 0.9 01/18/2022 1326   LYMPHSABS 1.0  09/03/2020 0951   LYMPHSABS 1.2 12/12/2013 1015   MONOABS 0.4 01/18/2022 1326   MONOABS 0.4 12/12/2013 1015   EOSABS 0.1 01/18/2022 1326   EOSABS 0.1 09/03/2020 0951   EOSABS 0.1 12/12/2013 1015   BASOSABS 0.0 01/18/2022 1326   BASOSABS 0.0 09/03/2020 0951   BASOSABS 0.1 12/12/2013 1015   Lab Results  Component Value Date   IRON 27 (L) 01/18/2022   TIBC 350 01/18/2022   IRONPCTSAT 8 (L) 01/18/2022   Lab Results  Component Value Date   FERRITIN 23 01/18/2022      STUDIES: No results found.  ASSESSMENT & PLAN:    1. Iron deficiency anemia: Patient's hemoglobin and iron stores have declined and she is also symptomatic.  Colonoscopy and EGD on May 02, 2020 did not reveal any distinct pathology.  Proceed with 510 mg IV Feraheme today.  Return to clinic in 1 week for second infusion.  Patient will then return to clinic in 3 months with repeat laboratory work, further evaluation, and continuation of treatment if needed.     2. History of melanoma: Melanoma of left thigh status post resection in 1993, exact stage is not known, metastatic to inguinal lymph node. No evidence of recurrent disease. 3.  Left hip pain: Resolved.  Patient underwent hip replacement surgery on May 04, 2020.    I spent a total of 30 minutes reviewing chart data, face-to-face evaluation with the patient, counseling and coordination of care as detailed above.   Patient expressed understanding and was in agreement with this plan. She also understands that She can call clinic at any time with any questions, concerns, or complaints.    Lloyd Huger, MD   01/20/2022 8:21 AM

## 2022-01-19 NOTE — Progress Notes (Unsigned)
Patient denies any concerns today.  

## 2022-01-20 ENCOUNTER — Encounter: Payer: Self-pay | Admitting: Oncology

## 2022-01-26 ENCOUNTER — Inpatient Hospital Stay: Payer: Medicare Other

## 2022-01-26 VITALS — BP 121/62 | HR 81 | Temp 97.5°F | Resp 16

## 2022-01-26 DIAGNOSIS — D508 Other iron deficiency anemias: Secondary | ICD-10-CM

## 2022-01-26 DIAGNOSIS — Z85820 Personal history of malignant melanoma of skin: Secondary | ICD-10-CM | POA: Diagnosis not present

## 2022-01-26 DIAGNOSIS — D509 Iron deficiency anemia, unspecified: Secondary | ICD-10-CM | POA: Diagnosis not present

## 2022-01-26 MED ORDER — SODIUM CHLORIDE 0.9 % IV SOLN
510.0000 mg | Freq: Once | INTRAVENOUS | Status: AC
  Administered 2022-01-26: 510 mg via INTRAVENOUS
  Filled 2022-01-26: qty 17

## 2022-01-26 MED ORDER — SODIUM CHLORIDE 0.9 % IV SOLN
Freq: Once | INTRAVENOUS | Status: AC
  Filled 2022-01-26: qty 250

## 2022-04-21 ENCOUNTER — Inpatient Hospital Stay: Payer: Medicare Other | Attending: Nurse Practitioner

## 2022-04-21 DIAGNOSIS — D696 Thrombocytopenia, unspecified: Secondary | ICD-10-CM | POA: Insufficient documentation

## 2022-04-21 DIAGNOSIS — D509 Iron deficiency anemia, unspecified: Secondary | ICD-10-CM | POA: Diagnosis not present

## 2022-04-21 LAB — CBC WITH DIFFERENTIAL/PLATELET
Abs Immature Granulocytes: 0.02 10*3/uL (ref 0.00–0.07)
Basophils Absolute: 0 10*3/uL (ref 0.0–0.1)
Basophils Relative: 1 %
Eosinophils Absolute: 0.1 10*3/uL (ref 0.0–0.5)
Eosinophils Relative: 2 %
HCT: 37.5 % (ref 36.0–46.0)
Hemoglobin: 11.9 g/dL — ABNORMAL LOW (ref 12.0–15.0)
Immature Granulocytes: 0 %
Lymphocytes Relative: 16 %
Lymphs Abs: 1 10*3/uL (ref 0.7–4.0)
MCH: 28.5 pg (ref 26.0–34.0)
MCHC: 31.7 g/dL (ref 30.0–36.0)
MCV: 89.9 fL (ref 80.0–100.0)
Monocytes Absolute: 0.4 10*3/uL (ref 0.1–1.0)
Monocytes Relative: 6 %
Neutro Abs: 4.5 10*3/uL (ref 1.7–7.7)
Neutrophils Relative %: 75 %
Platelets: 146 10*3/uL — ABNORMAL LOW (ref 150–400)
RBC: 4.17 MIL/uL (ref 3.87–5.11)
RDW: 14.6 % (ref 11.5–15.5)
WBC: 6 10*3/uL (ref 4.0–10.5)
nRBC: 0 % (ref 0.0–0.2)

## 2022-04-21 LAB — IRON AND TIBC
Iron: 25 ug/dL — ABNORMAL LOW (ref 28–170)
Saturation Ratios: 8 % — ABNORMAL LOW (ref 10.4–31.8)
TIBC: 332 ug/dL (ref 250–450)
UIBC: 307 ug/dL

## 2022-04-21 LAB — FERRITIN: Ferritin: 24 ng/mL (ref 11–307)

## 2022-04-22 ENCOUNTER — Inpatient Hospital Stay (HOSPITAL_BASED_OUTPATIENT_CLINIC_OR_DEPARTMENT_OTHER): Payer: Medicare Other | Admitting: Nurse Practitioner

## 2022-04-22 ENCOUNTER — Inpatient Hospital Stay: Payer: Medicare Other

## 2022-04-22 ENCOUNTER — Ambulatory Visit: Payer: Medicare Other | Admitting: Oncology

## 2022-04-22 ENCOUNTER — Other Ambulatory Visit: Payer: Self-pay

## 2022-04-22 VITALS — BP 106/71 | HR 84 | Temp 98.1°F | Resp 16 | Wt 204.0 lb

## 2022-04-22 DIAGNOSIS — D509 Iron deficiency anemia, unspecified: Secondary | ICD-10-CM

## 2022-04-22 DIAGNOSIS — D696 Thrombocytopenia, unspecified: Secondary | ICD-10-CM | POA: Diagnosis not present

## 2022-04-22 DIAGNOSIS — D508 Other iron deficiency anemias: Secondary | ICD-10-CM

## 2022-04-22 MED ORDER — SODIUM CHLORIDE 0.9 % IV SOLN
510.0000 mg | Freq: Once | INTRAVENOUS | Status: AC
  Administered 2022-04-22: 510 mg via INTRAVENOUS
  Filled 2022-04-22: qty 510

## 2022-04-22 MED ORDER — SODIUM CHLORIDE 0.9 % IV SOLN
Freq: Once | INTRAVENOUS | Status: AC
  Filled 2022-04-22: qty 250

## 2022-04-22 NOTE — Progress Notes (Signed)
[place]  [phone]) [phone]  Fax:([phone]) [place] DOB: 03-17-1946  MR#: [id number]  JYN#:[id number]  Patient Care Team: Jon Billings, NP as PCP - General (Nurse Practitioner) Lloyd Huger, MD as Consulting Physician (Hematology and Oncology)  CHIEF COMPLAINT: Iron deficiency anemia  INTERVAL HISTORY: Patient returns to clinic today for repeat laboratory work, further evaluation, and continuation of Feraheme.  She continues to have chronic weakness and fatigue, but otherwise feels well.  She has no neurologic complaints. She denies any recent fevers or illnesses. She has a good appetite and denies weight loss.  She denies any chest pain, shortness of breath, cough, or hemoptysis.  She denies any nausea, vomiting, constipation, or diarrhea. She has no melena or hematochezia. She has no urinary complaints.  Patient offers no further specific complaints today.  REVIEW OF SYSTEMS:   Review of Systems  Constitutional:  Positive for malaise/fatigue. Negative for chills, diaphoresis, fever and weight loss.  HENT:  Negative for nosebleeds.   Respiratory:  Negative for cough, hemoptysis, sputum production, shortness of breath and wheezing.   Cardiovascular:  Negative for chest pain, palpitations and leg swelling.  Gastrointestinal:  Negative for abdominal pain, blood in stool, constipation, diarrhea, melena, nausea and vomiting.  Genitourinary:  Negative for hematuria.  Musculoskeletal:  Negative for falls, joint pain and myalgias.  Skin:  Negative for rash.  Neurological:  Negative for dizziness, loss of consciousness, weakness and headaches.  Endo/Heme/Allergies:  Does not bruise/bleed easily.  Psychiatric/Behavioral:  Negative for depression. The patient is not nervous/anxious and does not have insomnia.   All other systems reviewed and are negative. As per HPI. Otherwise, a complete review of systems is negative.  ONCOLOGY  HISTORY: Oncology History   No history exists.    PAST MEDICAL HISTORY: Past Medical History:  Diagnosis Date   Adenomatous colon polyp    Anemia    Arthritis    Basal cell carcinoma    left supratip of nose nodular edges involved 08/27/20 Dr. Kellie Moor, left postauricular sulcus nodular bcc 01/02/20 exc 01/23/20 neg margins   Blood transfusion without reported diagnosis    Cataract    Childhood asthma    AS A CHILD   Chronic heartburn    On omeprazole   Complication of anesthesia    HARD TO WAKE UP   Dyspnea    low hgb. and iron  chronic problem   External hemorrhoids    Frequency of urination    Frequent loose stools    GERD (gastroesophageal reflux disease)    Heme positive stool    History of hiatal hernia    large   Hyperlipidemia    Hypothyroidism    Iron deficiency anemia    Melanoma (HCC)    left thigh s/p LN removal left groin   Metastatic melanoma ([place])    Followed by Dr Oliva Bustard, previous chemo, no recurrence, 2008?   Multinodular goiter    Osteoporosis    SCC (squamous cell carcinoma)    skin    PAST SURGICAL HISTORY: Past Surgical History:  Procedure Laterality Date   ABDOMINAL HYSTERECTOMY  1984   BACK SURGERY  03/2016   LUMBAR   CARPAL TUNNEL RELEASE Right 09/07/2016   Procedure: CARPAL TUNNEL RELEASE;  Surgeon: Thornton Park, MD;  Location: [facility] ORS;  Service: Orthopedics;  Laterality: Right;   CHOLECYSTECTOMY  1990   COLONOSCOPY  08/2006   Four sessile polyps found and removed in sigmoid colon at splenic flexure and  ascending colon. 7 mm in size. Another removed from transverse colon 4 mm. Path Report - showed tubular adenoma and hyperplastic polyp. Advised to repeat in 3.5 years (02/2010)   COLONOSCOPY  3.2.2011   8 mm polyp in sigmoid colon, removed, 4 mm polyp in descending colon, removed. Internal hemorrhoids   ESOPHAGOGASTRODUODENOSCOPY  3.2.2011   Large hiatia hernia, esophagus normal, mulitple small sessile polyps w/no stigmata of recent  bleeding found. Mildy erythermatous mucosa w/no bleeding found in gatric antrum. Normal duodenum. 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 PATH - no active inflammation, antral mucosa w/mild foveolar hyperplasia   FRACTURE SURGERY     Lymph Node Removal  12/2005   Left inguinal lymph node dissection   MELANOMA EXCISION Left 1993   Left thigh   ORIF ANKLE FRACTURE Right    Dr. Mack Guise   TOTAL HIP ARTHROPLASTY Right 11/18/2016   Procedure: TOTAL HIP ARTHROPLASTY;  Surgeon: Thornton Park, MD;  Location: [facility] ORS;  Service: Orthopedics;  Laterality: Right;   TOTAL HIP ARTHROPLASTY     left    FAMILY HISTORY Family History  Problem Relation Age of Onset   Aneurysm Mother    Heart disease Mother    Colon polyps Father    Diabetes Father    Dementia Father    Stroke Father        TIA   Macular degeneration Father    Hypothyroidism Daughter    Hypothyroidism Son    Leukemia Maternal Grandfather    Skin cancer Paternal Grandfather    Diabetes Other    Colon polyps Other    Colon cancer Neg Hx    Breast cancer Neg Hx    Stomach cancer Neg Hx    Rectal cancer Neg Hx    Esophageal cancer Neg Hx     GYNECOLOGIC HISTORY:  No LMP recorded. Patient has had a hysterectomy.     ADVANCED DIRECTIVES:    HEALTH MAINTENANCE: Social History   Tobacco Use   Smoking status: Former    Packs/day: 0.50    Years: 8.00    Total pack years: 4.00    Types: Cigarettes    Quit date: 07/12/1978    Years since quitting: 43.8   Smokeless tobacco: Never  Vaping Use   Vaping Use: Never used  Substance Use Topics   Alcohol use: No    Alcohol/week: 0.0 standard drinks of alcohol   Drug use: No    Allergies  Allergen Reactions   Sulfa Antibiotics Rash    Current Outpatient Medications  Medication Sig Dispense Refill   acetaminophen (TYLENOL) 650 MG CR tablet Take 1,300 mg by mouth daily.     B Complex Vitamins (B COMPLEX PO) Take by mouth.     Calcium Carbonate-Vitamin D  600-400 MG-UNIT tablet Take by mouth.     citalopram (CELEXA) 20 MG tablet Take 1 tablet (20 mg total) by mouth daily. 90 tablet 3   Ferrous Sulfate Dried 45 MG TBCR Take 45 mg by mouth daily with lunch.     levothyroxine (SYNTHROID) 125 MCG tablet TAKE 1 TABLET DAILY BEFORE BREAKFAST AND 1/2 TABLET ONSUNDAY (THIS IS CHANGE) 84 tablet 3   loperamide (IMODIUM) 2 MG capsule Take 2 mg by mouth every morning.     Multiple Vitamins-Minerals (EYE VITAMINS & MINERALS) TABS Take 1 tablet by mouth daily.     pravastatin (PRAVACHOL) 40 MG tablet Take 0.5 tablets (20 mg total) by mouth daily. 45 tablet 3   No current  facility-administered medications for this visit.   Facility-Administered Medications Ordered in Other Visits  Medication Dose Route Frequency Provider Last Rate Last Admin   0.9 %  sodium chloride infusion   Intravenous PRN Lloyd Huger, MD   Stopped at 03/13/21 0940    OBJECTIVE: BP 106/71   Pulse 84   Temp 98.1 F (36.7 C) (Tympanic)   Resp 16   Wt 204 lb (92.5 kg)   SpO2 95%   BMI 36.72 kg/m    Body mass index is 36.72 kg/m.    ECOG FS:1 - Symptomatic but completely ambulatory  General: Well-developed, well-nourished, no acute distress. Eyes: Pink conjunctiva, anicteric sclera. Lungs: Clear to auscultation bilaterally.  No audible wheezing or coughing Heart: Regular rate and rhythm.  Abdomen: Soft, nontender, nondistended.  Musculoskeletal: No edema, cyanosis, or clubbing. Neuro: Alert, answering all questions appropriately. Cranial nerves grossly intact. Skin: No rashes or petechiae noted. Psych: Normal affect.   LAB RESULTS:  CBC    Latest Ref Rng & Units 04/21/2022   11:30 AM 01/18/2022    1:26 PM 10/13/2021    2:14 PM  CBC  WBC 4.0 - 10.5 K/uL 6.0  5.7  6.6   Hemoglobin 12.0 - 15.0 g/dL 11.9  9.8  11.3   Hematocrit 36.0 - 46.0 % 37.5  32.7  36.9   Platelets 150 - 400 K/uL 146  166  162    Lab Results  Component Value Date   IRON 25 (L) 04/21/2022    TIBC 332 04/21/2022   IRONPCTSAT 8 (L) 04/21/2022   Lab Results  Component Value Date   FERRITIN 24 04/21/2022      STUDIES: No results found.  ASSESSMENT & PLAN:    1. Iron deficiency anemia: Patient's hemoglobin and iron stores have declined and she is also symptomatic.  Colonoscopy and EGD on May 02, 2020 did not reveal any distinct pathology.  Proceed with 510 mg IV Feraheme today.    2. History of melanoma: Melanoma of left thigh status post resection in 1993, exact stage is not known, metastatic to inguinal lymph node. Clinically, asymptomatic. She will continue surveillance with Dermatology.   3.  Left hip pain: Resolved.  Patient underwent hip replacement surgery on May 04, 2020.    4. Thrombocytopenia- mild. Plt 146. Monitor.   Disposition:  Feraheme today Feraheme next week 3 mo- lab (cbc, ferritin, iron studies) Day later, see [facility]ja, +/- feraheme - la  I spent a total of 20 minutes reviewing chart data, face-to-face evaluation with the patient, counseling and coordination of care as detailed above.  Patient expressed understanding and was in agreement with this plan. She also understands that She can call clinic at any time with any questions, concerns, or complaints.   Verlon Au, NP   04/22/2022

## 2022-04-22 NOTE — Progress Notes (Signed)
Pt declined 30 minute post observation period

## 2022-04-26 ENCOUNTER — Inpatient Hospital Stay: Payer: Medicare Other

## 2022-04-26 VITALS — BP 113/76 | HR 73 | Temp 97.9°F | Resp 16

## 2022-04-26 DIAGNOSIS — D509 Iron deficiency anemia, unspecified: Secondary | ICD-10-CM | POA: Diagnosis not present

## 2022-04-26 DIAGNOSIS — D696 Thrombocytopenia, unspecified: Secondary | ICD-10-CM | POA: Diagnosis not present

## 2022-04-26 DIAGNOSIS — D508 Other iron deficiency anemias: Secondary | ICD-10-CM

## 2022-04-26 MED ORDER — SODIUM CHLORIDE 0.9 % IV SOLN
510.0000 mg | Freq: Once | INTRAVENOUS | Status: AC
  Administered 2022-04-26: 510 mg via INTRAVENOUS
  Filled 2022-04-26: qty 510

## 2022-04-26 MED ORDER — SODIUM CHLORIDE 0.9 % IV SOLN
Freq: Once | INTRAVENOUS | Status: AC
  Filled 2022-04-26: qty 250

## 2022-05-04 ENCOUNTER — Other Ambulatory Visit (HOSPITAL_COMMUNITY): Payer: Self-pay | Admitting: Neurosurgery

## 2022-05-04 ENCOUNTER — Other Ambulatory Visit: Payer: Self-pay | Admitting: Neurosurgery

## 2022-05-04 DIAGNOSIS — M4316 Spondylolisthesis, lumbar region: Secondary | ICD-10-CM

## 2022-05-13 ENCOUNTER — Ambulatory Visit
Admission: RE | Admit: 2022-05-13 | Discharge: 2022-05-13 | Disposition: A | Discharge status: Home / Self Care | Payer: Medicare Other | Source: Ambulatory Visit | Attending: Neurosurgery | Admitting: Neurosurgery

## 2022-05-13 DIAGNOSIS — M4316 Spondylolisthesis, lumbar region: Secondary | ICD-10-CM | POA: Insufficient documentation

## 2022-05-13 DIAGNOSIS — M545 Low back pain, unspecified: Secondary | ICD-10-CM | POA: Diagnosis not present

## 2022-05-13 MED ORDER — GADOBUTROL 1 MMOL/ML IV SOLN
10.0000 mL | Freq: Once | INTRAVENOUS | Status: AC | PRN
  Administered 2022-05-13: 10 mL via INTRAVENOUS

## 2022-05-24 ENCOUNTER — Ambulatory Visit (INDEPENDENT_AMBULATORY_CARE_PROVIDER_SITE_OTHER): Payer: Medicare Other | Admitting: Nurse Practitioner

## 2022-05-24 ENCOUNTER — Encounter: Payer: Self-pay | Admitting: Nurse Practitioner

## 2022-05-24 VITALS — BP 133/80 | HR 97 | Temp 98.8°F | Wt 204.4 lb

## 2022-05-24 DIAGNOSIS — F32A Depression, unspecified: Secondary | ICD-10-CM

## 2022-05-24 DIAGNOSIS — F419 Anxiety disorder, unspecified: Secondary | ICD-10-CM | POA: Diagnosis not present

## 2022-05-24 DIAGNOSIS — R7303 Prediabetes: Secondary | ICD-10-CM

## 2022-05-24 DIAGNOSIS — E039 Hypothyroidism, unspecified: Secondary | ICD-10-CM | POA: Diagnosis not present

## 2022-05-24 DIAGNOSIS — E785 Hyperlipidemia, unspecified: Secondary | ICD-10-CM

## 2022-05-24 DIAGNOSIS — D509 Iron deficiency anemia, unspecified: Secondary | ICD-10-CM | POA: Diagnosis not present

## 2022-05-24 DIAGNOSIS — Z23 Encounter for immunization: Secondary | ICD-10-CM

## 2022-05-24 DIAGNOSIS — E669 Obesity, unspecified: Secondary | ICD-10-CM | POA: Diagnosis not present

## 2022-05-24 MED ORDER — CITALOPRAM HYDROBROMIDE 20 MG PO TABS: 20.0000 mg | ORAL_TABLET | Freq: Every day | ORAL | 3 refills | Status: DC

## 2022-05-24 MED ORDER — PRAVASTATIN SODIUM 40 MG PO TABS: 20.0000 mg | ORAL_TABLET | Freq: Every day | ORAL | 3 refills | Status: DC

## 2022-05-24 NOTE — Assessment & Plan Note (Signed)
Chronic.  Controlled.  Continue with current medication regimen.  Continue to follow up with Hematology.  Labs ordered today.  Return to clinic in 6 months for reevaluation.  Call sooner if concerns arise.

## 2022-05-24 NOTE — Assessment & Plan Note (Signed)
BMI 36. Recommended eating smaller high protein, low fat meals more frequently and exercising 30 mins a day 5 times a week with a goal of 10-15lb weight loss in the next 3 months. Patient voiced their understanding and motivation to adhere to these recommendations.

## 2022-05-24 NOTE — Assessment & Plan Note (Signed)
Chronic.  Controlled.  Continue with current medication regimen on levothyroxine 166mg daily.  Labs ordered today.  Return to clinic in 6 months for reevaluation.  Call sooner if concerns arise.

## 2022-05-24 NOTE — Progress Notes (Signed)
BP 133/80   Pulse 97   Temp 98.8 F (37.1 C) (Oral)   Wt 204 lb 6.4 oz (92.7 kg)   SpO2 93%   BMI 36.79 kg/m    Subjective:    Patient ID: Janet Rice, female    DOB: [birth year], 76 y.o.   MRN: [id number]  HPI: Janet Rice is a 76 y.o. female  Chief Complaint  Patient presents with   Hypothyroidism    6 month follow up.    Anemia   Obesity        HYPOTHYROIDISM Thyroid control status:controlled Satisfied with current treatment? yes Medication side effects: no Medication compliance: excellent compliance Etiology of hypothyroidism:  Recent dose adjustment:no Fatigue: yes Cold intolerance: yes Heat intolerance: no Weight gain: no Weight loss: no Constipation: no Diarrhea/loose stools: no Palpitations: no Lower extremity edema: no Anxiety/depressed mood: no Patient states her feet stay cold    HYPERLIPIDEMIA Hyperlipidemia status: excellent compliance Satisfied with current treatment?  no Side effects:  muscle aches Medication compliance: excellent compliance Past cholesterol meds: pravastatin (pravachol) Supplements: none Aspirin:  no The 10-year ASCVD risk score (Arnett DK, et al., 2019) is: 19.3%   Values used to calculate the score:     Age: 52 years     Sex: Female     Is Non-Hispanic African American: No     Diabetic: No     Tobacco smoker: No     Systolic Blood Pressure: 817 mmHg     Is BP treated: No     HDL Cholesterol: 64 mg/dL     Total Cholesterol: 213 mg/dL Chest pain:  no Coronary artery disease:  no Family history CAD:  no Family history early CAD:  no  ANEMIA Followed by Hematology.  Receives PRN iron infusions.  Receiving infusions about every 3 months.    MOOD Feels like her mood is good.  Celexa is working well for her.  Denies concerns at visit today.   [place] Office Visit from 05/24/2022 in [place]  PHQ-9 Total Score 1        Relevant past medical, surgical, family and social  history reviewed and updated as indicated. Interim medical history since our last visit reviewed. Allergies and medications reviewed and updated.  Review of Systems  Constitutional:  Negative for fatigue and unexpected weight change.  Eyes:  Negative for visual disturbance.  Respiratory:  Negative for cough, chest tightness and shortness of breath.   Cardiovascular:  Negative for chest pain, palpitations and leg swelling.  Gastrointestinal:  Negative for constipation and diarrhea.  Endocrine: Negative for cold intolerance and heat intolerance.  Neurological:  Negative for dizziness and headaches.  Psychiatric/Behavioral:  Negative for dysphoric mood. The patient is not nervous/anxious.     Per HPI unless specifically indicated above     Objective:    BP 133/80   Pulse 97   Temp 98.8 F (37.1 C) (Oral)   Wt 204 lb 6.4 oz (92.7 kg)   SpO2 93%   BMI 36.79 kg/m   Wt Readings from Last 3 Encounters:  05/24/22 204 lb 6.4 oz (92.7 kg)  04/22/22 204 lb (92.5 kg)  01/19/22 206 lb 6.4 oz (93.6 kg)    Physical Exam Vitals and nursing note reviewed.  Constitutional:      General: She is not in acute distress.    Appearance: Normal appearance. She is obese. She is not ill-appearing, toxic-appearing or diaphoretic.  HENT:     Head: Normocephalic.  Right Ear: External ear normal.     Left Ear: External ear normal.     Nose: Nose normal.     Mouth/Throat:     Mouth: Mucous membranes are moist.     Pharynx: Oropharynx is clear.  Eyes:     General:        Right eye: No discharge.        Left eye: No discharge.     Extraocular Movements: Extraocular movements intact.     Conjunctiva/sclera: Conjunctivae normal.     Pupils: Pupils are equal, round, and reactive to light.  Cardiovascular:     Rate and Rhythm: Normal rate and regular rhythm.     Heart sounds: No murmur heard. Pulmonary:     Effort: Pulmonary effort is normal. No respiratory distress.     Breath sounds: Normal  breath sounds. No wheezing or rales.  Musculoskeletal:     Cervical back: Normal range of motion and neck supple.  Skin:    General: Skin is warm and dry.     Capillary Refill: Capillary refill takes less than 2 seconds.  Neurological:     General: No focal deficit present.     Mental Status: She is alert and oriented to person, place, and time. Mental status is at baseline.  Psychiatric:        Mood and Affect: Mood normal.        Behavior: Behavior normal.        Thought Content: Thought content normal.        Judgment: Judgment normal.     Results for orders placed or performed in visit on 04/21/22  Iron and TIBC([facility])  Result Value Ref Range   Iron 25 (L) 28 - 170 ug/dL   TIBC 332 250 - 450 ug/dL   Saturation Ratios 8 (L) 10.4 - 31.8 %   UIBC 307 ug/dL  Ferritin  Result Value Ref Range   Ferritin 24 11 - 307 ng/mL  CBC with Differential  Result Value Ref Range   WBC 6.0 4.0 - 10.5 K/uL   RBC 4.17 3.87 - 5.11 MIL/uL   Hemoglobin 11.9 (L) 12.0 - 15.0 g/dL   HCT 37.5 36.0 - 46.0 %   MCV 89.9 80.0 - 100.0 fL   MCH 28.5 26.0 - 34.0 pg   MCHC 31.7 30.0 - 36.0 g/dL   RDW 14.6 11.5 - 15.5 %   Platelets 146 (L) 150 - 400 K/uL   nRBC 0.0 0.0 - 0.2 %   Neutrophils Relative % 75 %   Neutro Abs 4.5 1.7 - 7.7 K/uL   Lymphocytes Relative 16 %   Lymphs Abs 1.0 0.7 - 4.0 K/uL   Monocytes Relative 6 %   Monocytes Absolute 0.4 0.1 - 1.0 K/uL   Eosinophils Relative 2 %   Eosinophils Absolute 0.1 0.0 - 0.5 K/uL   Basophils Relative 1 %   Basophils Absolute 0.0 0.0 - 0.1 K/uL   Immature Granulocytes 0 %   Abs Immature Granulocytes 0.02 0.00 - 0.07 K/uL      Assessment & Plan:   Problem List Items Addressed This Visit       Endocrine   Hypothyroidism - Primary    Chronic.  Controlled.  Continue with current medication regimen on levothyroxine 137mg daily.  Labs ordered today.  Return to clinic in 6 months for reevaluation.  Call sooner if concerns arise.         Relevant Orders   Comp Met (CMET)  TSH   T4, free     Other   Iron deficiency anemia    Chronic.  Controlled.  Continue with current medication regimen.  Continue to follow up with Hematology.  Labs ordered today.  Return to clinic in 6 months for reevaluation.  Call sooner if concerns arise.        Relevant Orders   Comp Met (CMET)   CBC w/Diff   Hyperlipidemia    Chronic.  Controlled.  Continue with current medication regimen on pravastatin 71m.  Having some myalgia.  Can decrease use to three times weekly.  Labs ordered today.  Return to clinic in 6 months for reevaluation.  Call sooner if concerns arise.        Relevant Medications   pravastatin (PRAVACHOL) 40 MG tablet   Other Relevant Orders   Comp Met (CMET)   Lipid Profile   Obesity (BMI 30-39.9)    BMI 36. Recommended eating smaller high protein, low fat meals more frequently and exercising 30 mins a day 5 times a week with a goal of 10-15lb weight loss in the next 3 months. Patient voiced their understanding and motivation to adhere to these recommendations.       Relevant Orders   Comp Met (CMET)   Anxiety and depression    Chronic.  Controlled.  Continue with current medication regimen on Celexa 253m  Labs ordered today. Doing well on current dose of medication.  Refill sent today.  Return to clinic in 6 months for reevaluation.  Call sooner if concerns arise.        Relevant Medications   citalopram (CELEXA) 20 MG tablet   Prediabetes    Chronic.  Controlled.  Continue with current medication regimen.  Labs ordered today.  Return to clinic in 6 months for reevaluation.  Call sooner if concerns arise.         Relevant Orders   HgB A1c   Other Visit Diagnoses     Need for influenza vaccination       Relevant Orders   Flu Vaccine QUAD High Dose(Fluad)        Follow up plan: Return in about 6 months (around 11/22/2022) for HTN, HLD, DM2 FU.

## 2022-05-24 NOTE — Assessment & Plan Note (Signed)
Chronic.  Controlled.  Continue with current medication regimen on Celexa '20mg'$ .  Labs ordered today. Doing well on current dose of medication.  Refill sent today.  Return to clinic in 6 months for reevaluation.  Call sooner if concerns arise.

## 2022-05-24 NOTE — Assessment & Plan Note (Signed)
Chronic.  Controlled.  Continue with current medication regimen on pravastatin '20mg'$ .  Having some myalgia.  Can decrease use to three times weekly.  Labs ordered today.  Return to clinic in 6 months for reevaluation.  Call sooner if concerns arise.

## 2022-05-24 NOTE — Assessment & Plan Note (Signed)
Chronic.  Controlled.  Continue with current medication regimen.  Labs ordered today.  Return to clinic in 6 months for reevaluation.  Call sooner if concerns arise.  ? ?

## 2022-05-25 LAB — CBC WITH DIFFERENTIAL/PLATELET
Basophils Absolute: 0 10*3/uL (ref 0.0–0.2)
Basos: 0 %
EOS (ABSOLUTE): 0.1 10*3/uL (ref 0.0–0.4)
Eos: 1 %
Hematocrit: 39.4 % (ref 34.0–46.6)
Hemoglobin: 13 g/dL (ref 11.1–15.9)
Immature Grans (Abs): 0 10*3/uL (ref 0.0–0.1)
Immature Granulocytes: 0 %
Lymphocytes Absolute: 1.1 10*3/uL (ref 0.7–3.1)
Lymphs: 12 %
MCH: 30 pg (ref 26.6–33.0)
MCHC: 33 g/dL (ref 31.5–35.7)
MCV: 91 fL (ref 79–97)
Monocytes Absolute: 0.5 10*3/uL (ref 0.1–0.9)
Monocytes: 6 %
Neutrophils Absolute: 7.4 10*3/uL — ABNORMAL HIGH (ref 1.4–7.0)
Neutrophils: 81 %
Platelets: 156 10*3/uL (ref 150–450)
RBC: 4.33 x10E6/uL (ref 3.77–5.28)
RDW: 15.7 % — ABNORMAL HIGH (ref 11.7–15.4)
WBC: 9.1 10*3/uL (ref 3.4–10.8)

## 2022-05-25 LAB — COMPREHENSIVE METABOLIC PANEL
ALT: 39 IU/L — ABNORMAL HIGH (ref 0–32)
AST: 36 IU/L (ref 0–40)
Albumin/Globulin Ratio: 1.5 (ref 1.2–2.2)
Albumin: 4.3 g/dL (ref 3.8–4.8)
Alkaline Phosphatase: 137 IU/L — ABNORMAL HIGH (ref 44–121)
BUN/Creatinine Ratio: 13 (ref 12–28)
BUN: 16 mg/dL (ref 8–27)
Bilirubin Total: 0.2 mg/dL (ref 0.0–1.2)
CO2: 25 mmol/L (ref 20–29)
Calcium: 10 mg/dL (ref 8.7–10.3)
Chloride: 99 mmol/L (ref 96–106)
Creatinine, Ser: 1.27 mg/dL — ABNORMAL HIGH (ref 0.57–1.00)
Globulin, Total: 2.9 g/dL (ref 1.5–4.5)
Glucose: 164 mg/dL — ABNORMAL HIGH (ref 70–99)
Potassium: 4.7 mmol/L (ref 3.5–5.2)
Sodium: 143 mmol/L (ref 134–144)
Total Protein: 7.2 g/dL (ref 6.0–8.5)
eGFR: 44 mL/min/{1.73_m2} — ABNORMAL LOW (ref >59)

## 2022-05-25 LAB — HEMOGLOBIN A1C
Est. average glucose Bld gHb Est-mCnc: 105 mg/dL
Hgb A1c MFr Bld: 5.3 % (ref 4.8–5.6)

## 2022-05-25 LAB — LIPID PANEL
Chol/HDL Ratio: 3.9 ratio (ref 0.0–4.4)
Cholesterol, Total: 221 mg/dL — ABNORMAL HIGH (ref 100–199)
HDL: 57 mg/dL (ref >39)
LDL Chol Calc (NIH): 115 mg/dL — ABNORMAL HIGH (ref 0–99)
Triglycerides: 285 mg/dL — ABNORMAL HIGH (ref 0–149)
VLDL Cholesterol Cal: 49 mg/dL — ABNORMAL HIGH (ref 5–40)

## 2022-05-25 LAB — T4, FREE: Free T4: 1.26 ng/dL (ref 0.82–1.77)

## 2022-05-25 LAB — TSH: TSH: 1.7 u[IU]/mL (ref 0.450–4.500)

## 2022-05-25 NOTE — Progress Notes (Signed)
Hi Janet Rice. It was nice to see you yesterday.  Your lab work looks good.  Your kidney function remains stable.  Make sure to drink plenty of water and avoid NSAIDS such as ibuprofen, motrin and aleve.  Your cholesterol is elevated.  I recommend continuing with the pravastatin every other day like we discussed. Your A1c is in the normal range so no concern with diabetes.  No other concerns at this time. Continue with your current medication regimen.  Follow up as discussed.  Please let me know if you have any questions.

## 2022-06-25 DIAGNOSIS — M5136 Other intervertebral disc degeneration, lumbar region: Secondary | ICD-10-CM | POA: Diagnosis not present

## 2022-06-25 DIAGNOSIS — M5416 Radiculopathy, lumbar region: Secondary | ICD-10-CM | POA: Diagnosis not present

## 2022-06-25 DIAGNOSIS — M47816 Spondylosis without myelopathy or radiculopathy, lumbar region: Secondary | ICD-10-CM | POA: Diagnosis not present

## 2022-07-09 ENCOUNTER — Encounter: Payer: Self-pay | Admitting: Oncology

## 2022-07-14 ENCOUNTER — Encounter: Payer: Self-pay | Admitting: Oncology

## 2022-07-21 ENCOUNTER — Inpatient Hospital Stay: Payer: Medicare Other | Attending: Nurse Practitioner

## 2022-07-21 DIAGNOSIS — R531 Weakness: Secondary | ICD-10-CM | POA: Diagnosis not present

## 2022-07-21 DIAGNOSIS — Z85828 Personal history of other malignant neoplasm of skin: Secondary | ICD-10-CM | POA: Diagnosis not present

## 2022-07-21 DIAGNOSIS — D509 Iron deficiency anemia, unspecified: Secondary | ICD-10-CM | POA: Diagnosis not present

## 2022-07-21 DIAGNOSIS — R5383 Other fatigue: Secondary | ICD-10-CM | POA: Insufficient documentation

## 2022-07-21 LAB — CBC
HCT: 38.4 % (ref 36.0–46.0)
Hemoglobin: 12.1 g/dL (ref 12.0–15.0)
MCH: 28.6 pg (ref 26.0–34.0)
MCHC: 31.5 g/dL (ref 30.0–36.0)
MCV: 90.8 fL (ref 80.0–100.0)
Platelets: 169 10*3/uL (ref 150–400)
RBC: 4.23 MIL/uL (ref 3.87–5.11)
RDW: 14.2 % (ref 11.5–15.5)
WBC: 6.1 10*3/uL (ref 4.0–10.5)
nRBC: 0 % (ref 0.0–0.2)

## 2022-07-21 LAB — IRON AND TIBC
Iron: 29 ug/dL (ref 28–170)
Saturation Ratios: 8 % — ABNORMAL LOW (ref 10.4–31.8)
TIBC: 360 ug/dL (ref 250–450)
UIBC: 331 ug/dL

## 2022-07-21 LAB — FERRITIN: Ferritin: 22 ng/mL (ref 11–307)

## 2022-07-22 ENCOUNTER — Encounter: Payer: Self-pay | Admitting: Oncology

## 2022-07-22 ENCOUNTER — Inpatient Hospital Stay: Payer: Medicare Other

## 2022-07-22 ENCOUNTER — Inpatient Hospital Stay (HOSPITAL_BASED_OUTPATIENT_CLINIC_OR_DEPARTMENT_OTHER): Payer: Medicare Other | Admitting: Oncology

## 2022-07-22 VITALS — BP 121/66 | HR 68 | Resp 18

## 2022-07-22 VITALS — BP 127/67 | HR 71 | Temp 98.1°F | Resp 18 | Ht 62.25 in | Wt 203.4 lb

## 2022-07-22 DIAGNOSIS — D509 Iron deficiency anemia, unspecified: Secondary | ICD-10-CM

## 2022-07-22 DIAGNOSIS — Z85828 Personal history of other malignant neoplasm of skin: Secondary | ICD-10-CM | POA: Diagnosis not present

## 2022-07-22 DIAGNOSIS — D508 Other iron deficiency anemias: Secondary | ICD-10-CM

## 2022-07-22 DIAGNOSIS — R531 Weakness: Secondary | ICD-10-CM | POA: Diagnosis not present

## 2022-07-22 DIAGNOSIS — R5383 Other fatigue: Secondary | ICD-10-CM | POA: Diagnosis not present

## 2022-07-22 MED ORDER — SODIUM CHLORIDE 0.9 % IV SOLN
510.0000 mg | Freq: Once | INTRAVENOUS | Status: AC
  Administered 2022-07-22: 510 mg via INTRAVENOUS
  Filled 2022-07-22: qty 510

## 2022-07-22 MED ORDER — SODIUM CHLORIDE 0.9 % IV SOLN
INTRAVENOUS | Status: DC
  Filled 2022-07-22: qty 250

## 2022-07-22 NOTE — Progress Notes (Signed)
[facility]  [phone]  [phone]) [phone]     Janet Rice DOB: 1945-10-24  MR#: [id number]  NGE#:[id number]  Patient Care Team: Jon Billings, NP as PCP - General (Nurse Practitioner) Lloyd Huger, MD as Consulting Physician (Hematology and Oncology)   CHIEF COMPLAINT: Iron deficiency anemia.  INTERVAL HISTORY: Patient returns to clinic today for repeat laboratory work, further evaluation, and consideration of additional Feraheme.  She has chronic weakness and fatigue, but admits this has improved.  She otherwise feels well.  She has no neurologic complaints. She denies any recent fevers or illnesses. She has a good appetite and denies weight loss.  She denies any chest pain, shortness of breath, cough, or hemoptysis.  She denies any nausea, vomiting, constipation, or diarrhea. She has no melena or hematochezia. She has no urinary complaints.  Patient offers no further specific complaints today.  REVIEW OF SYSTEMS:   Review of Systems  Constitutional:  Positive for malaise/fatigue. Negative for fever and weight loss.  Eyes: Negative.   Respiratory: Negative.  Negative for cough and shortness of breath.   Cardiovascular: Negative.  Negative for chest pain and leg swelling.  Gastrointestinal: Negative.  Negative for abdominal pain, blood in stool and melena.  Genitourinary: Negative.  Negative for hematuria.  Musculoskeletal: Negative.  Negative for back pain and joint pain.  Skin: Negative.  Negative for rash.  Neurological:  Positive for weakness. Negative for dizziness, sensory change and focal weakness.  Psychiatric/Behavioral: Negative.  The patient is not nervous/anxious.     As per HPI. Otherwise, a complete review of systems is negative.  ONCOLOGY HISTORY: Oncology History   No history exists.    PAST MEDICAL HISTORY: Past Medical History:  Diagnosis Date   Adenomatous colon polyp    Anemia    Arthritis    Basal cell  carcinoma    left supratip of nose nodular edges involved 08/27/20 Dr. Kellie Moor, left postauricular sulcus nodular bcc 01/02/20 exc 01/23/20 neg margins   Blood transfusion without reported diagnosis    Cataract    Childhood asthma    AS A CHILD   Chronic heartburn    On omeprazole   Complication of anesthesia    HARD TO WAKE UP   Dyspnea    low hgb. and iron  chronic problem   External hemorrhoids    Frequency of urination    Frequent loose stools    GERD (gastroesophageal reflux disease)    Heme positive stool    History of hiatal hernia    large   Hyperlipidemia    Hypothyroidism    Iron deficiency anemia    Melanoma (HCC)    left thigh s/p LN removal left groin   Metastatic melanoma ([place])    Followed by Dr Oliva Bustard, previous chemo, no recurrence, 2008?   Multinodular goiter    Osteoporosis    SCC (squamous cell carcinoma)    skin    PAST SURGICAL HISTORY: Past Surgical History:  Procedure Laterality Date   ABDOMINAL HYSTERECTOMY  1984   BACK SURGERY  03/2016   LUMBAR   CARPAL TUNNEL RELEASE Right 09/07/2016   Procedure: CARPAL TUNNEL RELEASE;  Surgeon: Thornton Park, MD;  Location: [facility] ORS;  Service: Orthopedics;  Laterality: Right;   CHOLECYSTECTOMY  1990   COLONOSCOPY  08/2006   Four sessile polyps found and removed in sigmoid colon at splenic flexure and ascending colon. 7 mm in size. Another removed from transverse colon 4 mm. Path Report - showed tubular  adenoma and hyperplastic polyp. Advised to repeat in 3.5 years (02/2010)   COLONOSCOPY  3.2.2011   8 mm polyp in sigmoid colon, removed, 4 mm polyp in descending colon, removed. Internal hemorrhoids   ESOPHAGOGASTRODUODENOSCOPY  3.2.2011   Large hiatia hernia, esophagus normal, mulitple small sessile polyps w/no stigmata of recent bleeding found. Mildy erythermatous mucosa w/no bleeding found in gatric antrum. Normal duodenum. 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 PATH - no active inflammation,  antral mucosa w/mild foveolar hyperplasia   FRACTURE SURGERY     Lymph Node Removal  12/2005   Left inguinal lymph node dissection   MELANOMA EXCISION Left 1993   Left thigh   ORIF ANKLE FRACTURE Right    Dr. Mack Guise   TOTAL HIP ARTHROPLASTY Right 11/18/2016   Procedure: TOTAL HIP ARTHROPLASTY;  Surgeon: Thornton Park, MD;  Location: [facility] OR[facility];  Service: Orthopedics;  Laterality: Right;   TOTAL HIP ARTHROPLASTY     left    FAMILY HISTORY Family History  Problem Relation Age of Onset   Aneurysm Mother    Heart disease Mother    Colon polyps Father    Diabetes Father    Dementia Father    Stroke Father        TIA   Macular degeneration Father    Hypothyroidism Daughter    Hypothyroidism Son    Leukemia Maternal Grandfather    Skin cancer Paternal Grandfather    Diabetes Other    Colon polyps Other    Colon cancer Neg Hx    Breast cancer Neg Hx    Stomach cancer Neg Hx    Rectal cancer Neg Hx    Esophageal cancer Neg Hx     GYNECOLOGIC HISTORY:  No LMP recorded. Patient has had a hysterectomy.     ADVANCED DIRECTIVES:    HEALTH MAINTENANCE: Social History   Tobacco Use   Smoking status: Former    Packs/day: 0.50    Years: 8.00    Total pack years: 4.00    Types: Cigarettes    Quit date: 07/12/1978    Years since quitting: 44.0   Smokeless tobacco: Never  Vaping Use   Vaping Use: Never used  Substance Use Topics   Alcohol use: No    Alcohol/week: 0.0 standard drinks of alcohol   Drug use: No    Allergies  Allergen Reactions   Sulfa Antibiotics Rash    Current Outpatient Medications  Medication Sig Dispense Refill   acetaminophen (TYLENOL) 650 MG CR tablet Take 1,300 mg by mouth daily.     B Complex Vitamins (B COMPLEX PO) Take by mouth.     Calcium Carbonate-Vitamin D 600-400 MG-UNIT tablet Take by mouth.     citalopram (CELEXA) 20 MG tablet Take 1 tablet (20 mg total) by mouth daily. 90 tablet 3   Ferrous Sulfate Dried 45 MG TBCR Take 45 mg by  mouth daily with lunch.     levothyroxine (SYNTHROID) 125 MCG tablet TAKE 1 TABLET DAILY BEFORE BREAKFAST AND 1/2 TABLET ONSUNDAY (THIS IS CHANGE) 84 tablet 3   loperamide (IMODIUM) 2 MG capsule Take 2 mg by mouth every morning.     Multiple Vitamins-Minerals (EYE VITAMINS & MINERALS) TABS Take 1 tablet by mouth daily.     pravastatin (PRAVACHOL) 40 MG tablet Take 0.5 tablets (20 mg total) by mouth daily. 45 tablet 3   No current facility-administered medications for this visit.   Facility-Administered Medications Ordered in Other Visits  Medication Dose Route Frequency  Provider Last Rate Last Admin   0.9 %  sodium chloride infusion   Intravenous PRN Lloyd Huger, MD   Stopped at 03/13/21 0940    OBJECTIVE: BP 127/67 (BP Location: Left Arm, Patient Position: Sitting, Cuff Size: Large)   Pulse 71   Temp 98.1 F (36.7 C) (Tympanic)   Resp 18   Ht 5' 2.25" (1.581 m)   Wt 203 lb 6.4 oz (92.3 kg)   SpO2 96%   BMI 36.90 kg/m    Body mass index is 36.9 kg/m.    ECOG FS:0 - Asymptomatic  General: Well-developed, well-nourished, no acute distress. Eyes: Pink conjunctiva, anicteric sclera. HEENT: Normocephalic, moist mucous membranes. Lungs: No audible wheezing or coughing. Heart: Regular rate and rhythm. Abdomen: Soft, nontender, no obvious distention. Musculoskeletal: No edema, cyanosis, or clubbing. Neuro: Alert, answering all questions appropriately. Cranial nerves grossly intact. Skin: No rashes or petechiae noted. Psych: Normal affect.  LAB RESULTS:  CBC    Component Value Date/Time   WBC 6.1 07/21/2022 1258   RBC 4.23 07/21/2022 1258   HGB 12.1 07/21/2022 1258   HGB 13.0 05/24/2022 1612   HCT 38.4 07/21/2022 1258   HCT 39.4 05/24/2022 1612   PLT 169 07/21/2022 1258   PLT 156 05/24/2022 1612   MCV 90.8 07/21/2022 1258   MCV 91 05/24/2022 1612   MCV 74 (L) 12/12/2013 1015   MCH 28.6 07/21/2022 1258   MCHC 31.5 07/21/2022 1258   RDW 14.2 07/21/2022 1258   RDW  15.7 (H) 05/24/2022 1612   RDW 33.6 (H) 12/12/2013 1015   LYMPHSABS 1.1 05/24/2022 1612   LYMPHSABS 1.2 12/12/2013 1015   MONOABS 0.4 04/21/2022 1130   MONOABS 0.4 12/12/2013 1015   EOSABS 0.1 05/24/2022 1612   EOSABS 0.1 12/12/2013 1015   BASOSABS 0.0 05/24/2022 1612   BASOSABS 0.1 12/12/2013 1015   Lab Results  Component Value Date   IRON 29 07/21/2022   TIBC 360 07/21/2022   IRONPCTSAT 8 (L) 07/21/2022   Lab Results  Component Value Date   FERRITIN 22 07/21/2022      STUDIES: No results found.  ASSESSMENT & PLAN:    1. Iron deficiency anemia: Patient's hemoglobin has significantly improved and is now 12.1.  Iron stores have also improved, but she continues to have a mildly decreased iron saturation at 8%.  Colonoscopy and EGD on May 02, 2020 did not reveal any distinct pathology.  Proceed with 1 additional dose of 510 mg IV Feraheme today.  Patient does not require second infusion.  Return to clinic in 3 months with repeat laboratory work, further evaluation, and continuation of treatment if needed.   2. History of melanoma: Melanoma of left thigh status post resection in 1993, exact stage is not known, metastatic to inguinal lymph node. No evidence of recurrent disease. 3.  Left hip pain: Patient does not complain of this today.  Patient underwent hip replacement surgery on May 04, 2020.    I spent a total of 30 minutes reviewing chart data, face-to-face evaluation with the patient, counseling and coordination of care as detailed above.    Patient expressed understanding and was in agreement with this plan. She also understands that She can call clinic at any time with any questions, concerns, or complaints.    Lloyd Huger, MD   07/23/2022 7:13 AM

## 2022-07-22 NOTE — Progress Notes (Signed)
Pt has been educated and understands. Pt refused to stay 30 mins after iron infusion. VSS. 

## 2022-07-23 ENCOUNTER — Encounter: Payer: Self-pay | Admitting: Oncology

## 2022-09-01 ENCOUNTER — Other Ambulatory Visit: Payer: Self-pay | Admitting: Family

## 2022-09-01 DIAGNOSIS — E039 Hypothyroidism, unspecified: Secondary | ICD-10-CM

## 2022-09-02 ENCOUNTER — Telehealth: Payer: Self-pay | Admitting: Nurse Practitioner

## 2022-09-02 DIAGNOSIS — L820 Inflamed seborrheic keratosis: Secondary | ICD-10-CM | POA: Diagnosis not present

## 2022-09-02 DIAGNOSIS — L298 Other pruritus: Secondary | ICD-10-CM | POA: Diagnosis not present

## 2022-09-02 DIAGNOSIS — Z85820 Personal history of malignant melanoma of skin: Secondary | ICD-10-CM | POA: Diagnosis not present

## 2022-09-02 DIAGNOSIS — L821 Other seborrheic keratosis: Secondary | ICD-10-CM | POA: Diagnosis not present

## 2022-09-02 DIAGNOSIS — Z08 Encounter for follow-up examination after completed treatment for malignant neoplasm: Secondary | ICD-10-CM | POA: Diagnosis not present

## 2022-09-02 DIAGNOSIS — D225 Melanocytic nevi of trunk: Secondary | ICD-10-CM | POA: Diagnosis not present

## 2022-09-02 DIAGNOSIS — L570 Actinic keratosis: Secondary | ICD-10-CM | POA: Diagnosis not present

## 2022-09-02 NOTE — Telephone Encounter (Signed)
[place] to schedule their annual wellness visit. Appointment made for 09/27/2022.  Sherol Dade; Care Guide Ambulatory [facility] Direct Dial: [phone]

## 2022-09-03 ENCOUNTER — Encounter: Payer: Self-pay | Admitting: Oncology

## 2022-09-09 ENCOUNTER — Encounter: Payer: Self-pay | Admitting: Oncology

## 2022-09-27 ENCOUNTER — Ambulatory Visit (INDEPENDENT_AMBULATORY_CARE_PROVIDER_SITE_OTHER): Payer: Medicare Other

## 2022-09-27 VITALS — Ht 62.25 in | Wt 203.0 lb

## 2022-09-27 DIAGNOSIS — Z0000 Encounter for general adult medical examination without abnormal findings: Secondary | ICD-10-CM | POA: Diagnosis not present

## 2022-09-27 NOTE — Progress Notes (Signed)
I connected with  [name] on 09/27/22 by a audio enabled telemedicine application and verified that I am speaking with the correct person using two identifiers.  Patient Location: Home  Provider Location: Office/Clinic  I discussed the limitations of evaluation and management by telemedicine. The patient expressed understanding and agreed to proceed.  Subjective:   FATMEH GAGO is a 77 y.o. female who presents for Medicare Annual (Subsequent) preventive examination.  Review of Systems     Cardiac Risk Factors include: advanced age (>25men, >48 women);obesity (BMI >30kg/m2)     Objective:    There were no vitals filed for this visit. There is no height or weight on file to calculate BMI.     09/27/2022   11:09 AM 07/22/2022    2:34 PM 10/15/2021    1:32 PM 09/09/2021    9:59 AM 07/15/2021   10:15 AM 05/19/2021    1:04 PM 02/02/2021    1:00 PM  Advanced Directives  Does Patient Have a Medical Advance Directive? No Yes Yes Yes Yes Yes   Type of Corporate treasurer of [place];Living will [place];Living will Healthcare Power of [place];Living will [place];Living will   Does patient want to make changes to medical advance directive?     No - Patient declined No - Patient declined No - Patient declined  Copy of [place] in Chart?    No - copy requested     Would patient like information on creating a medical advance directive? No - Patient declined          Current Medications (verified) Outpatient Encounter Medications as of 09/27/2022  Medication Sig   acetaminophen (TYLENOL) 650 MG CR tablet Take 1,300 mg by mouth daily.   B Complex Vitamins (B COMPLEX PO) Take by mouth.   Calcium Carbonate-Vitamin D 600-400 MG-UNIT tablet Take by mouth.   citalopram (CELEXA) 20 MG tablet Take 1 tablet (20 mg total) by mouth daily.   Ferrous Sulfate Dried 45 MG TBCR Take 45 mg  by mouth daily with lunch.   levothyroxine (SYNTHROID) 125 MCG tablet TAKE 1 TABLET DAILY BEFORE BREAKFAST AND 1/2 TABLET ONSUNDAY (THIS IS CHANGE)   loperamide (IMODIUM) 2 MG capsule Take 2 mg by mouth every morning.   Multiple Vitamins-Minerals (EYE VITAMINS & MINERALS) TABS Take 1 tablet by mouth daily.   pravastatin (PRAVACHOL) 40 MG tablet Take 0.5 tablets (20 mg total) by mouth daily.   Facility-Administered Encounter Medications as of 09/27/2022  Medication   0.9 %  sodium chloride infusion    Allergies (verified) Misc. sulfonamide containing compounds and Sulfa antibiotics   History: Past Medical History:  Diagnosis Date   Adenomatous colon polyp    Anemia    Arthritis    Basal cell carcinoma    left supratip of nose nodular edges involved 08/27/20 Dr. Kellie Moor, left postauricular sulcus nodular bcc 01/02/20 exc 01/23/20 neg margins   Blood transfusion without reported diagnosis    Cataract    Childhood asthma    AS A CHILD   Chronic heartburn    On omeprazole   Complication of anesthesia    HARD TO WAKE UP   Dyspnea    low hgb. and iron  chronic problem   External hemorrhoids    Frequency of urination    Frequent loose stools    GERD (gastroesophageal reflux disease)    Heme positive stool    History  of hiatal hernia    large   Hyperlipidemia    Hypothyroidism    Iron deficiency anemia    Melanoma ([place])    left thigh s/p LN removal left groin   Metastatic melanoma ([place])    Followed by Dr Oliva Bustard, previous chemo, no recurrence, 2008?   Multinodular goiter    Osteoporosis    SCC (squamous cell carcinoma)    skin   Past Surgical History:  Procedure Laterality Date   ABDOMINAL HYSTERECTOMY  1984   BACK SURGERY  03/2016   LUMBAR   CARPAL TUNNEL RELEASE Right 09/07/2016   Procedure: CARPAL TUNNEL RELEASE;  Surgeon: Thornton Park, MD;  Location: [facility] ORS;  Service: Orthopedics;  Laterality: Right;   CHOLECYSTECTOMY  1990   COLONOSCOPY  08/2006   Four sessile  polyps found and removed in sigmoid colon at splenic flexure and ascending colon. 7 mm in size. Another removed from transverse colon 4 mm. Path Report - showed tubular adenoma and hyperplastic polyp. Advised to repeat in 3.5 years (02/2010)   COLONOSCOPY  3.2.2011   8 mm polyp in sigmoid colon, removed, 4 mm polyp in descending colon, removed. Internal hemorrhoids   ESOPHAGOGASTRODUODENOSCOPY  3.2.2011   Large hiatia hernia, esophagus normal, mulitple small sessile polyps w/no stigmata of recent bleeding found. Mildy erythermatous mucosa w/no bleeding found in gatric antrum. Normal duodenum. 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 PATH - no active inflammation, antral mucosa w/mild foveolar hyperplasia   FRACTURE SURGERY     Lymph Node Removal  12/2005   Left inguinal lymph node dissection   MELANOMA EXCISION Left 1993   Left thigh   ORIF ANKLE FRACTURE Right    Dr. Mack Guise   TOTAL HIP ARTHROPLASTY Right 11/18/2016   Procedure: TOTAL HIP ARTHROPLASTY;  Surgeon: Thornton Park, MD;  Location: [facility] ORS;  Service: Orthopedics;  Laterality: Right;   TOTAL HIP ARTHROPLASTY     left   Family History  Problem Relation Age of Onset   Aneurysm Mother    Heart disease Mother    Colon polyps Father    Diabetes Father    Dementia Father    Stroke Father        TIA   Macular degeneration Father    Hypothyroidism Daughter    Hypothyroidism Son    Leukemia Maternal Grandfather    Skin cancer Paternal Grandfather    Diabetes Other    Colon polyps Other    Colon cancer Neg Hx    Breast cancer Neg Hx    Stomach cancer Neg Hx    Rectal cancer Neg Hx    Esophageal cancer Neg Hx    Social History   Socioeconomic History   Marital status: Married    Spouse name: Not on file   Number of children: 2   Years of education: Not on file   Highest education level: Not on file  Occupational History   Occupation: HR Consultant    Employer: [employer]  Tobacco Use   Smoking status:  Former    Packs/day: 0.50    Years: 8.00    Additional pack years: 0.00    Total pack years: 4.00    Types: Cigarettes    Quit date: 07/12/1978    Years since quitting: 44.2   Smokeless tobacco: Never  Vaping Use   Vaping Use: Never used  Substance and Sexual Activity   Alcohol use: No    Alcohol/week: 0.0 standard drinks of alcohol   Drug use: No  Sexual activity: Not Currently  Other Topics Concern   Not on file  Social History Narrative   Part time [employer] as of 03/10/20 retired 08/29/20    Married    1 daughter    1 son    Social Determinants of Health   Financial Resource Strain: Low Risk  (09/27/2022)   Overall Financial Resource Strain (CARDIA)    Difficulty of Paying Living Expenses: Not hard at all  Food Insecurity: No Food Insecurity (09/27/2022)   Hunger Vital Sign    Worried About Running Out of Food in the Last Year: Never true    [place] in the Last Year: Never true  Transportation Needs: No Transportation Needs (09/27/2022)   PRAPARE - Hydrologist (Medical): No    Lack of Transportation (Non-Medical): No  Physical Activity: Insufficiently Active (09/27/2022)   Exercise Vital Sign    Days of Exercise per Week: 3 days    Minutes of Exercise per Session: 30 min  Stress: No Stress Concern Present (09/27/2022)   [place]    Feeling of Stress : Not at all  Social Connections: Moderately Integrated (09/27/2022)   Social Connection and Isolation Panel [NHANES]    Frequency of Communication with Friends and Family: More than three times a week    Frequency of Social Gatherings with Friends and Family: Never    Attends Religious Services: Never    Marine scientist or Organizations: Yes    Attends Music therapist: More than 4 times per year    Marital Status: Married    Tobacco Counseling Counseling given: Not Answered   Clinical  Intake:  Pre-visit preparation completed: Yes  Pain : No/denies pain     Nutritional Risks: None Diabetes: No  How often do you need to have someone help you when you read instructions, pamphlets, or other written materials from your doctor or pharmacy?: 1 - Never  Diabetic?no  Interpreter Needed?: No  Information entered by :: Kirke Shaggy, LPN   Activities of Daily Living    09/27/2022   11:10 AM  In your present state of health, do you have any difficulty performing the following activities:  Hearing? 0  Vision? 0  Difficulty concentrating or making decisions? 0  Walking or climbing stairs? 0  Dressing or bathing? 0  Doing errands, shopping? 0  Preparing Food and eating ? N  Using the Toilet? N  In the past six months, have you accidently leaked urine? N  Do you have problems with loss of bowel control? N  Managing your Medications? N  Managing your Finances? N  Housekeeping or managing your Housekeeping? N    Patient Care Team: Jon Billings, NP as PCP - General (Nurse Practitioner) Lloyd Huger, MD as Consulting Physician (Hematology and Oncology)  Indicate any recent Medical Services you may have received from other than [facility] providers in the past year (date may be approximate).     Assessment:   This is a routine wellness examination for [place].  Hearing/Vision screen Hearing Screening - Comments:: No aids Vision Screening - Comments:: Wears for driving- Dr.Pilger in [place]  Dietary issues and exercise activities discussed: Current Exercise Habits: Home exercise routine, Type of exercise: stretching;walking, Time (Minutes): 30, Frequency (Times/Week): 3, Weekly Exercise (Minutes/Week): 90, Intensity: Mild   Goals Addressed             This Visit's Progress  DIET - EAT MORE FRUITS AND VEGETABLES         Depression Screen    09/27/2022   11:07 AM 05/24/2022    3:46 PM 11/18/2021    1:08 PM 09/09/2021   10:04 AM 05/21/2021     1:51 PM 09/02/2020   11:40 AM 04/30/2020   11:52 AM  PHQ 2/9 Scores  PHQ - 2 Score 0 0 0 0 0 0 0  PHQ- 9 Score 0 1 2  1  0     Fall Risk    09/27/2022   11:10 AM 05/24/2022    3:46 PM 11/18/2021    1:08 PM 09/09/2021    9:55 AM 05/21/2021    1:51 PM  [place] in the past year? 0 1 0 0 0  Number falls in past yr: 0 0 0 0 0  Injury with Fall? 0 0 0 0 0  Risk for fall due to : No Fall Risks History of fall(s) No Fall Risks  No Fall Risks  Follow up Falls prevention discussed;Falls evaluation completed Falls evaluation completed Falls evaluation completed Education provided;Falls prevention discussed;Falls evaluation completed Falls evaluation completed    FALL RISK PREVENTION PERTAINING TO THE HOME:  Any stairs in or around the home? Yes  If so, are there any without handrails? No  Home free of loose throw rugs in walkways, pet beds, electrical cords, etc? Yes  Adequate lighting in your home to reduce risk of falls? Yes   ASSISTIVE DEVICES UTILIZED TO PREVENT FALLS:  Life alert? No  Use of a cane, walker or w/c? Yes -cane  Grab bars in the bathroom? Yes  Shower chair or bench in shower? No  Elevated toilet seat or a handicapped toilet? Yes   Cognitive Function:    12/30/2017    1:36 PM 10/15/2015   10:16 AM  MMSE - Mini Mental State Exam  Orientation to time 5 5  Orientation to Place 5 5  Registration 3 3  Attention/ Calculation 5 5  Recall 3 3  Language- name 2 objects 2 2  Language- repeat 1 1  Language- follow 3 step command 3 3  Language- read & follow direction 1 1  Write a sentence 1 1  Copy design 1 1  Total score 30 30        09/27/2022   11:14 AM 09/09/2021    9:58 AM  6CIT Screen  What Year? 0 points 0 points  What month? 0 points 0 points  What time? 0 points 0 points  Count back from 20 0 points 0 points  Months in reverse 0 points 0 points  Repeat phrase 0 points 0 points  Total Score 0 points 0 points    Immunizations Immunization  History  Administered Date(s) Administered   Fluad Quad(high Dose 65+) 04/26/2019, 04/29/2020, 05/24/2022   Influenza Split 08/17/2011, 05/12/2014   Influenza, High Dose Seasonal PF 08/31/2016, 07/22/2017, 06/13/2018, 04/23/2021   Influenza-Unspecified 05/16/2013, 04/11/2014, 04/27/2019   PFIZER Comirnaty(Gray Top)Covid-19 Tri-Sucrose Vaccine 10/22/2020   PFIZER(Purple Top)SARS-COV-2 Vaccination 08/03/2019, 08/25/2019, 04/17/2020   Pfizer Covid-19 Vaccine Bivalent Booster 81yrs & up 04/23/2021   Pneumococcal Conjugate-13 01/16/2014   Pneumococcal Polysaccharide-23 03/24/2012, 08/22/2018   Tdap 03/24/2012   Zoster Recombinat (Shingrix) 01/28/2022   Zoster, Live 08/18/2012    TDAP status: Due, Education has been provided regarding the importance of this vaccine. Advised may receive this vaccine at local pharmacy or Health Dept. Aware to provide a copy of the vaccination  record if obtained from local pharmacy or Health Dept. Verbalized acceptance and understanding.  Flu Vaccine status: Up to date  Pneumococcal vaccine status: Up to date  Covid-19 vaccine status: Completed vaccines  Qualifies for Shingles Vaccine? Yes   Zostavax completed Yes   Shingrix Completed?: No.    Education has been provided regarding the importance of this vaccine. Patient has been advised to call insurance company to determine out of pocket expense if they have not yet received this vaccine. Advised may also receive vaccine at local pharmacy or Health Dept. Verbalized acceptance and understanding.  Screening Tests Health Maintenance  Topic Date Due   COVID-19 Vaccine (6 - 2023-24 season) 03/12/2022   DTaP/Tdap/Td (2 - Td or Tdap) 03/24/2022   Zoster Vaccines- Shingrix (2 of 2) 03/25/2022   COLONOSCOPY (Pts 45-50yrs Insurance coverage will need to be confirmed)  05/03/2023   Medicare Annual Wellness (AWV)  09/27/2023   Pneumonia Vaccine 72+ Years old  Completed   INFLUENZA VACCINE  Completed   DEXA SCAN   Completed   Hepatitis C Screening  Completed   HPV VACCINES  Aged Out    Health Maintenance  Health Maintenance Due  Topic Date Due   COVID-19 Vaccine (6 - 2023-24 season) 03/12/2022   DTaP/Tdap/Td (2 - Td or Tdap) 03/24/2022   Zoster Vaccines- Shingrix (2 of 2) 03/25/2022  Has had first of Shingrix  Colorectal cancer screening: No longer required.   Mammogram status: No longer required due to age.  Bone Density status: Completed 01/05/19. Results reflect: Bone density results: NORMAL. Repeat every 5 years.  Lung Cancer Screening: (Low Dose CT Chest recommended if Age 28-80 years, 30 pack-year currently smoking OR have quit w/in 15years.) does not qualify.   Additional Screening:  Hepatitis C Screening: does qualify; Completed 10/15/15  Vision Screening: Recommended annual ophthalmology exams for early detection of glaucoma and other disorders of the eye. Is the patient up to date with their annual eye exam?  Yes  Who is the provider or what is the name of the office in which the patient attends annual eye exams? Dr.Schmaltz If pt is not established with a provider, would they like to be referred to a provider to establish care? No .   Dental Screening: Recommended annual dental exams for proper oral hygiene  Community Resource Referral / Chronic Care Management: CRR required this visit?  No   CCM required this visit?  No      Plan:     I have personally reviewed and noted the following in the patient's chart:   Medical and social history Use of alcohol, tobacco or illicit drugs  Current medications and supplements including opioid prescriptions. Patient is not currently taking opioid prescriptions. Functional ability and status Nutritional status Physical activity Advanced directives List of other physicians Hospitalizations, surgeries, and ER visits in previous 12 months Vitals Screenings to include cognitive, depression, and falls Referrals and appointments  In  addition, I have reviewed and discussed with patient certain preventive protocols, quality metrics, and best practice recommendations. A written personalized care plan for preventive services as well as general preventive health recommendations were provided to patient.     Dionisio David, LPN   [id number]   Nurse Notes: none

## 2022-09-27 NOTE — Patient Instructions (Signed)
Janet Rice , Thank you for taking time to come for your Medicare Wellness Visit. I appreciate your ongoing commitment to your health goals. Please review the following plan we discussed and let me know if I can assist you in the future.   These are the goals we discussed:  Goals      DIET - EAT MORE FRUITS AND VEGETABLES     Increase physical activity     Low carb diet     Maintain Healthy Lifestyle     Exercise as tolerated        This is a list of the screening recommended for you and due dates:  Health Maintenance  Topic Date Due   COVID-19 Vaccine (6 - 2023-24 season) 03/12/2022   DTaP/Tdap/Td vaccine (2 - Td or Tdap) 03/24/2022   Zoster (Shingles) Vaccine (2 of 2) 03/25/2022   Colon Cancer Screening  05/03/2023   Medicare Annual Wellness Visit  09/27/2023   Pneumonia Vaccine  Completed   Flu Shot  Completed   DEXA scan (bone density measurement)  Completed   Hepatitis C Screening: USPSTF Recommendation to screen - Ages 72-79 yo.  Completed   HPV Vaccine  Aged Out    Advanced directives: no  Conditions/risks identified: none  Next appointment: Follow up in one year for your annual wellness visit 10/03/23 @ 10:45 am by phone   Preventive Care 65 Years and Older, Female Preventive care refers to lifestyle choices and visits with your health care provider that can promote health and wellness. What does preventive care include? A yearly physical exam. This is also called an annual well check. Dental exams once or twice a year. Routine eye exams. Ask your health care provider how often you should have your eyes checked. Personal lifestyle choices, including: Daily care of your teeth and gums. Regular physical activity. Eating a healthy diet. Avoiding tobacco and drug use. Limiting alcohol use. Practicing safe sex. Taking low-dose aspirin every day. Taking vitamin and mineral supplements as recommended by your health care provider. What happens during an annual well  check? The services and screenings done by your health care provider during your annual well check will depend on your age, overall health, lifestyle risk factors, and family history of disease. Counseling  Your health care provider may ask you questions about your: Alcohol use. Tobacco use. Drug use. Emotional well-being. Home and relationship well-being. Sexual activity. Eating habits. History of falls. Memory and ability to understand (cognition). Work and work Statistician. Reproductive health. Screening  You may have the following tests or measurements: Height, weight, and BMI. Blood pressure. Lipid and cholesterol levels. These may be checked every 5 years, or more frequently if you are over 32 years old. Skin check. Lung cancer screening. You may have this screening every year starting at age 12 if you have a 30-pack-year history of smoking and currently smoke or have quit within the past 15 years. Fecal occult blood test (FOBT) of the stool. You may have this test every year starting at age 79. Flexible sigmoidoscopy or colonoscopy. You may have a sigmoidoscopy every 5 years or a colonoscopy every 10 years starting at age 11. Hepatitis C blood test. Hepatitis B blood test. Sexually transmitted disease (STD) testing. Diabetes screening. This is done by checking your blood sugar (glucose) after you have not eaten for a while (fasting). You may have this done every 1-3 years. Bone density scan. This is done to screen for osteoporosis. You may have this done starting  at age 58. Mammogram. This may be done every 1-2 years. Talk to your health care provider about how often you should have regular mammograms. Talk with your health care provider about your test results, treatment options, and if necessary, the need for more tests. Vaccines  Your health care provider may recommend certain vaccines, such as: Influenza vaccine. This is recommended every year. Tetanus, diphtheria, and  acellular pertussis (Tdap, Td) vaccine. You may need a Td booster every 10 years. Zoster vaccine. You may need this after age 92. Pneumococcal 13-valent conjugate (PCV13) vaccine. One dose is recommended after age 45. Pneumococcal polysaccharide (PPSV23) vaccine. One dose is recommended after age 56. Talk to your health care provider about which screenings and vaccines you need and how often you need them. This information is not intended to replace advice given to you by your health care provider. Make sure you discuss any questions you have with your health care provider. Document Released: 07/25/2015 Document Revised: 03/17/2016 Document Reviewed: 04/29/2015 Elsevier Interactive Patient Education  2017 [place] Prevention in the Home Falls can cause injuries. They can happen to people of all ages. There are many things you can do to make your home safe and to help prevent falls. What can I do on the outside of my home? Regularly fix the edges of walkways and driveways and fix any cracks. Remove anything that might make you trip as you walk through a door, such as a raised step or threshold. Trim any bushes or trees on the path to your home. Use bright outdoor lighting. Clear any walking paths of anything that might make someone trip, such as rocks or tools. Regularly check to see if handrails are loose or broken. Make sure that both sides of any steps have handrails. Any raised decks and porches should have guardrails on the edges. Have any leaves, snow, or ice cleared regularly. Use sand or salt on walking paths during winter. Clean up any spills in your garage right away. This includes oil or grease spills. What can I do in the bathroom? Use night lights. Install grab bars by the toilet and in the tub and shower. Do not use towel bars as grab bars. Use non-skid mats or decals in the tub or shower. If you need to sit down in the shower, use a plastic, non-slip stool. Keep  the floor dry. Clean up any water that spills on the floor as soon as it happens. Remove soap buildup in the tub or shower regularly. Attach bath mats securely with double-sided non-slip rug tape. Do not have throw rugs and other things on the floor that can make you trip. What can I do in the bedroom? Use night lights. Make sure that you have a light by your bed that is easy to reach. Do not use any sheets or blankets that are too big for your bed. They should not hang down onto the floor. Have a firm chair that has side arms. You can use this for support while you get dressed. Do not have throw rugs and other things on the floor that can make you trip. What can I do in the kitchen? Clean up any spills right away. Avoid walking on wet floors. Keep items that you use a lot in easy-to-reach places. If you need to reach something above you, use a strong step stool that has a grab bar. Keep electrical cords out of the way. Do not use floor polish or wax that makes  floors slippery. If you must use wax, use non-skid floor wax. Do not have throw rugs and other things on the floor that can make you trip. What can I do with my stairs? Do not leave any items on the stairs. Make sure that there are handrails on both sides of the stairs and use them. Fix handrails that are broken or loose. Make sure that handrails are as long as the stairways. Check any carpeting to make sure that it is firmly attached to the stairs. Fix any carpet that is loose or worn. Avoid having throw rugs at the top or bottom of the stairs. If you do have throw rugs, attach them to the floor with carpet tape. Make sure that you have a light switch at the top of the stairs and the bottom of the stairs. If you do not have them, ask someone to add them for you. What else can I do to help prevent falls? Wear shoes that: Do not have high heels. Have rubber bottoms. Are comfortable and fit you well. Are closed at the toe. Do not  wear sandals. If you use a stepladder: Make sure that it is fully opened. Do not climb a closed stepladder. Make sure that both sides of the stepladder are locked into place. Ask someone to hold it for you, if possible. Clearly mark and make sure that you can see: Any grab bars or handrails. First and last steps. Where the edge of each step is. Use tools that help you move around (mobility aids) if they are needed. These include: Canes. Walkers. Scooters. Crutches. Turn on the lights when you go into a dark area. Replace any light bulbs as soon as they burn out. Set up your furniture so you have a clear path. Avoid moving your furniture around. If any of your floors are uneven, fix them. If there are any pets around you, be aware of where they are. Review your medicines with your doctor. Some medicines can make you feel dizzy. This can increase your chance of falling. Ask your doctor what other things that you can do to help prevent falls. This information is not intended to replace advice given to you by your health care provider. Make sure you discuss any questions you have with your health care provider. Document Released: 04/24/2009 Document Revised: 12/04/2015 Document Reviewed: 08/02/2014 Elsevier Interactive Patient Education  2017 [employer].

## 2022-10-15 ENCOUNTER — Encounter: Payer: Self-pay | Admitting: Oncology

## 2022-10-19 ENCOUNTER — Other Ambulatory Visit: Payer: Self-pay

## 2022-10-19 DIAGNOSIS — D509 Iron deficiency anemia, unspecified: Secondary | ICD-10-CM

## 2022-10-20 ENCOUNTER — Inpatient Hospital Stay: Payer: Medicare Other | Attending: Nurse Practitioner

## 2022-10-20 DIAGNOSIS — D509 Iron deficiency anemia, unspecified: Secondary | ICD-10-CM | POA: Diagnosis not present

## 2022-10-20 DIAGNOSIS — Z85820 Personal history of malignant melanoma of skin: Secondary | ICD-10-CM | POA: Diagnosis not present

## 2022-10-20 LAB — IRON AND TIBC
Iron: 36 ug/dL (ref 28–170)
Saturation Ratios: 11 % (ref 10.4–31.8)
TIBC: 325 ug/dL (ref 250–450)
UIBC: 289 ug/dL

## 2022-10-20 LAB — CBC WITH DIFFERENTIAL/PLATELET
Abs Immature Granulocytes: 0.02 10*3/uL (ref 0.00–0.07)
Basophils Absolute: 0.1 10*3/uL (ref 0.0–0.1)
Basophils Relative: 1 %
Eosinophils Absolute: 0.2 10*3/uL (ref 0.0–0.5)
Eosinophils Relative: 3 %
HCT: 42.2 % (ref 36.0–46.0)
Hemoglobin: 13.8 g/dL (ref 12.0–15.0)
Immature Granulocytes: 0 %
Lymphocytes Relative: 16 %
Lymphs Abs: 1 10*3/uL (ref 0.7–4.0)
MCH: 28.5 pg (ref 26.0–34.0)
MCHC: 32.7 g/dL (ref 30.0–36.0)
MCV: 87.2 fL (ref 80.0–100.0)
Monocytes Absolute: 0.4 10*3/uL (ref 0.1–1.0)
Monocytes Relative: 7 %
Neutro Abs: 4.5 10*3/uL (ref 1.7–7.7)
Neutrophils Relative %: 73 %
Platelets: 146 10*3/uL — ABNORMAL LOW (ref 150–400)
RBC: 4.84 MIL/uL (ref 3.87–5.11)
RDW: 13.9 % (ref 11.5–15.5)
WBC: 6.1 10*3/uL (ref 4.0–10.5)
nRBC: 0 % (ref 0.0–0.2)

## 2022-10-20 LAB — FERRITIN: Ferritin: 28 ng/mL (ref 11–307)

## 2022-10-21 ENCOUNTER — Encounter: Payer: Self-pay | Admitting: Oncology

## 2022-10-21 ENCOUNTER — Inpatient Hospital Stay (HOSPITAL_BASED_OUTPATIENT_CLINIC_OR_DEPARTMENT_OTHER): Payer: Medicare Other | Admitting: Oncology

## 2022-10-21 ENCOUNTER — Inpatient Hospital Stay: Payer: Medicare Other

## 2022-10-21 VITALS — BP 114/66 | HR 87 | Temp 98.3°F | Resp 16 | Ht 62.25 in | Wt 196.0 lb

## 2022-10-21 DIAGNOSIS — Z85820 Personal history of malignant melanoma of skin: Secondary | ICD-10-CM | POA: Diagnosis not present

## 2022-10-21 DIAGNOSIS — D509 Iron deficiency anemia, unspecified: Secondary | ICD-10-CM

## 2022-10-21 MED FILL — Ferumoxytol Inj 510 MG/17ML (30 MG/ML) (Elemental Fe): INTRAVENOUS | Qty: 17 | Status: AC

## 2022-10-21 NOTE — Progress Notes (Signed)
[facility]  Telephone:(336) [phone]  [phone]) [phone]     Janet Rice DOB: Oct 17, 1945  MR#: [id number]  AOZ#:[id number]#:[id number]  Patient Care Team: Larae GroomsHoldsworth, Karen, NP as PCP - General (Nurse Practitioner) Jeralyn RuthsFinnegan, Derionna Salvador J, MD as Consulting Physician (Hematology and Oncology)   CHIEF COMPLAINT: Iron deficiency anemia.  INTERVAL HISTORY: Patient returns to clinic today for repeat laboratory work, further evaluation, and consideration of additional Feraheme.  She does not complain of weakness or fatigue today and states she is "not terrible".  She has no neurologic complaints. She denies any recent fevers or illnesses. She has a good appetite and denies weight loss.  She denies any chest pain, shortness of breath, cough, or hemoptysis.  She denies any nausea, vomiting, constipation, or diarrhea. She has no melena or hematochezia. She has no urinary complaints.  Patient offers no specific complaints today.  REVIEW OF SYSTEMS:   Review of Systems  Constitutional: Negative.  Negative for fever, malaise/fatigue and weight loss.  Eyes: Negative.   Respiratory: Negative.  Negative for cough and shortness of breath.   Cardiovascular: Negative.  Negative for chest pain and leg swelling.  Gastrointestinal: Negative.  Negative for abdominal pain, blood in stool and melena.  Genitourinary: Negative.  Negative for hematuria.  Musculoskeletal: Negative.  Negative for back pain and joint pain.  Skin: Negative.  Negative for rash.  Neurological: Negative.  Negative for dizziness, sensory change, focal weakness and weakness.  Psychiatric/Behavioral: Negative.  The patient is not nervous/anxious.     As per HPI. Otherwise, a complete review of systems is negative.  ONCOLOGY HISTORY: Oncology History   No history exists.    PAST MEDICAL HISTORY: Past Medical History:  Diagnosis Date   Adenomatous colon polyp    Anemia    Arthritis    Basal cell carcinoma    left supratip  of nose nodular edges involved 08/27/20 Dr. Roseanne KaufmanIsenstein, left postauricular sulcus nodular bcc 01/02/20 exc 01/23/20 neg margins   Blood transfusion without reported diagnosis    Cataract    Childhood asthma    AS A CHILD   Chronic heartburn    On omeprazole   Complication of anesthesia    HARD TO WAKE UP   Dyspnea    low hgb. and iron  chronic problem   External hemorrhoids    Frequency of urination    Frequent loose stools    GERD (gastroesophageal reflux disease)    Heme positive stool    History of hiatal hernia    large   Hyperlipidemia    Hypothyroidism    Iron deficiency anemia    Melanoma    left thigh s/p LN removal left groin   Metastatic melanoma    Followed by Dr Doylene Canninghoksi, previous chemo, no recurrence, 2008?   Multinodular goiter    Osteoporosis    SCC (squamous cell carcinoma)    skin    PAST SURGICAL HISTORY: Past Surgical History:  Procedure Laterality Date   ABDOMINAL HYSTERECTOMY  1984   BACK SURGERY  03/2016   LUMBAR   CARPAL TUNNEL RELEASE Right 09/07/2016   Procedure: CARPAL TUNNEL RELEASE;  Surgeon: Juanell FairlyKevin Krasinski, MD;  Location: [facility] ORS;  Service: Orthopedics;  Laterality: Right;   CHOLECYSTECTOMY  1990   COLONOSCOPY  08/2006   Four sessile polyps found and removed in sigmoid colon at splenic flexure and ascending colon. 7 mm in size. Another removed from transverse colon 4 mm. Path Report - showed tubular adenoma and hyperplastic polyp. Advised to  repeat in 3.5 years (02/2010)   COLONOSCOPY  3.2.2011   8 mm polyp in sigmoid colon, removed, 4 mm polyp in descending colon, removed. Internal hemorrhoids   ESOPHAGOGASTRODUODENOSCOPY  3.2.2011   Large hiatia hernia, esophagus normal, mulitple small sessile polyps w/no stigmata of recent bleeding found. Mildy erythermatous mucosa w/no bleeding found in gatric antrum. Normal duodenum. 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 PATH - no active inflammation, antral mucosa w/mild foveolar hyperplasia    FRACTURE SURGERY     Lymph Node Removal  12/2005   Left inguinal lymph node dissection   MELANOMA EXCISION Left 1993   Left thigh   ORIF ANKLE FRACTURE Right    Dr. Martha Clan   TOTAL HIP ARTHROPLASTY Right 11/18/2016   Procedure: TOTAL HIP ARTHROPLASTY;  Surgeon: Juanell Fairly, MD;  Location: [facility] ORS;  Service: Orthopedics;  Laterality: Right;   TOTAL HIP ARTHROPLASTY     left    FAMILY HISTORY Family History  Problem Relation Age of Onset   Aneurysm Mother    Heart disease Mother    Colon polyps Father    Diabetes Father    Dementia Father    Stroke Father        TIA   Macular degeneration Father    Hypothyroidism Daughter    Hypothyroidism Son    Leukemia Maternal Grandfather    Skin cancer Paternal Grandfather    Diabetes Other    Colon polyps Other    Colon cancer Neg Hx    Breast cancer Neg Hx    Stomach cancer Neg Hx    Rectal cancer Neg Hx    Esophageal cancer Neg Hx     GYNECOLOGIC HISTORY:  No LMP recorded. Patient has had a hysterectomy.     ADVANCED DIRECTIVES:    HEALTH MAINTENANCE: Social History   Tobacco Use   Smoking status: Former    Packs/day: 0.50    Years: 8.00    Additional pack years: 0.00    Total pack years: 4.00    Types: Cigarettes    Quit date: 07/12/1978    Years since quitting: 44.3   Smokeless tobacco: Never  Vaping Use   Vaping Use: Never used  Substance Use Topics   Alcohol use: No    Alcohol/week: 0.0 standard drinks of alcohol   Drug use: No    Allergies  Allergen Reactions   Misc. Sulfonamide Containing Compounds Other (See Comments)   Sulfa Antibiotics Rash    Current Outpatient Medications  Medication Sig Dispense Refill   acetaminophen (TYLENOL) 650 MG CR tablet Take 1,300 mg by mouth daily.     B Complex Vitamins (B COMPLEX PO) Take by mouth.     Calcium Carbonate-Vitamin D 600-400 MG-UNIT tablet Take by mouth.     citalopram (CELEXA) 20 MG tablet Take 1 tablet (20 mg total) by mouth daily. 90 tablet 3    Ferrous Sulfate Dried 45 MG TBCR Take 45 mg by mouth daily with lunch.     levothyroxine (SYNTHROID) 125 MCG tablet TAKE 1 TABLET DAILY BEFORE BREAKFAST AND 1/2 TABLET ONSUNDAY (THIS IS CHANGE) 84 tablet 3   loperamide (IMODIUM) 2 MG capsule Take 2 mg by mouth every morning.     Multiple Vitamins-Minerals (EYE VITAMINS & MINERALS) TABS Take 1 tablet by mouth daily.     pravastatin (PRAVACHOL) 40 MG tablet Take 0.5 tablets (20 mg total) by mouth daily. 45 tablet 3   No current facility-administered medications for this visit.   Facility-Administered  Medications Ordered in Other Visits  Medication Dose Route Frequency Provider Last Rate Last Admin   0.9 %  sodium chloride infusion   Intravenous PRN Jeralyn Ruths, MD   Stopped at 03/13/21 0940    OBJECTIVE: BP 114/66 (BP Location: Left Arm, Patient Position: Sitting, Cuff Size: Large)   Pulse 87   Temp 98.3 F (36.8 C) (Tympanic)   Resp 16   Ht 5' 2.25" (1.581 m)   Wt 196 lb (88.9 kg)   SpO2 94%   BMI 35.56 kg/m    Body mass index is 35.56 kg/m.    ECOG FS:0 - Asymptomatic  General: Well-developed, well-nourished, no acute distress. Eyes: Pink conjunctiva, anicteric sclera. HEENT: Normocephalic, moist mucous membranes. Lungs: No audible wheezing or coughing. Heart: Regular rate and rhythm. Abdomen: Soft, nontender, no obvious distention. Musculoskeletal: No edema, cyanosis, or clubbing. Neuro: Alert, answering all questions appropriately. Cranial nerves grossly intact. Skin: No rashes or petechiae noted. Psych: Normal affect.  LAB RESULTS:  CBC    Component Value Date/Time   WBC 6.1 10/20/2022 1134   RBC 4.84 10/20/2022 1134   HGB 13.8 10/20/2022 1134   HGB 13.0 05/24/2022 1612   HCT 42.2 10/20/2022 1134   HCT 39.4 05/24/2022 1612   PLT 146 (L) 10/20/2022 1134   PLT 156 05/24/2022 1612   MCV 87.2 10/20/2022 1134   MCV 91 05/24/2022 1612   MCV 74 (L) 12/12/2013 1015   MCH 28.5 10/20/2022 1134   MCHC 32.7  10/20/2022 1134   RDW 13.9 10/20/2022 1134   RDW 15.7 (H) 05/24/2022 1612   RDW 33.6 (H) 12/12/2013 1015   LYMPHSABS 1.0 10/20/2022 1134   LYMPHSABS 1.1 05/24/2022 1612   LYMPHSABS 1.2 12/12/2013 1015   MONOABS 0.4 10/20/2022 1134   MONOABS 0.4 12/12/2013 1015   EOSABS 0.2 10/20/2022 1134   EOSABS 0.1 05/24/2022 1612   EOSABS 0.1 12/12/2013 1015   BASOSABS 0.1 10/20/2022 1134   BASOSABS 0.0 05/24/2022 1612   BASOSABS 0.1 12/12/2013 1015   Lab Results  Component Value Date   IRON 36 10/20/2022   TIBC 325 10/20/2022   IRONPCTSAT 11 10/20/2022   Lab Results  Component Value Date   FERRITIN 28 10/20/2022      STUDIES: No results found.  ASSESSMENT & PLAN:    1. Iron deficiency anemia: Patient's hemoglobin and iron stores are now within normal limits. Colonoscopy and EGD on May 02, 2020 did not reveal any distinct pathology.  She does not require additional IV Feraheme today.  Patient last received treatment on July 22, 2022.  Return to clinic in 3 months with repeat laboratory work, further evaluation, and continuation of treatment if needed.     2. History of melanoma: Melanoma of left thigh status post resection in 1993, exact stage is not known, metastatic to inguinal lymph node. No evidence of recurrent disease.  I spent a total of 20 minutes reviewing chart data, face-to-face evaluation with the patient, counseling and coordination of care as detailed above.    Patient expressed understanding and was in agreement with this plan. She also understands that She can call clinic at any time with any questions, concerns, or complaints.    Jeralyn Ruths, MD   10/21/2022 2:38 PM

## 2022-11-03 ENCOUNTER — Encounter: Payer: Self-pay | Admitting: Nurse Practitioner

## 2022-11-03 DIAGNOSIS — E039 Hypothyroidism, unspecified: Secondary | ICD-10-CM

## 2022-11-05 MED ORDER — LEVOTHYROXINE SODIUM 125 MCG PO TABS
125.0000 ug | ORAL_TABLET | Freq: Every day | ORAL | 1 refills | Status: DC
Start: 2022-11-05 — End: 2022-11-24

## 2022-11-12 ENCOUNTER — Encounter: Payer: Self-pay | Admitting: Oncology

## 2022-11-22 ENCOUNTER — Ambulatory Visit: Payer: Medicare Other | Admitting: Nurse Practitioner

## 2022-11-22 DIAGNOSIS — H35711 Central serous chorioretinopathy, right eye: Secondary | ICD-10-CM | POA: Diagnosis not present

## 2022-11-22 DIAGNOSIS — H26491 Other secondary cataract, right eye: Secondary | ICD-10-CM | POA: Diagnosis not present

## 2022-11-24 ENCOUNTER — Ambulatory Visit (INDEPENDENT_AMBULATORY_CARE_PROVIDER_SITE_OTHER): Payer: Medicare Other | Admitting: Nurse Practitioner

## 2022-11-24 ENCOUNTER — Encounter: Payer: Self-pay | Admitting: Nurse Practitioner

## 2022-11-24 VITALS — BP 98/62 | HR 79 | Temp 98.4°F | Wt 193.6 lb

## 2022-11-24 DIAGNOSIS — E785 Hyperlipidemia, unspecified: Secondary | ICD-10-CM

## 2022-11-24 DIAGNOSIS — F32A Depression, unspecified: Secondary | ICD-10-CM

## 2022-11-24 DIAGNOSIS — E039 Hypothyroidism, unspecified: Secondary | ICD-10-CM

## 2022-11-24 DIAGNOSIS — R7303 Prediabetes: Secondary | ICD-10-CM | POA: Diagnosis not present

## 2022-11-24 DIAGNOSIS — F419 Anxiety disorder, unspecified: Secondary | ICD-10-CM | POA: Diagnosis not present

## 2022-11-24 DIAGNOSIS — D509 Iron deficiency anemia, unspecified: Secondary | ICD-10-CM

## 2022-11-24 MED ORDER — LEVOTHYROXINE SODIUM 125 MCG PO TABS
125.0000 ug | ORAL_TABLET | Freq: Every day | ORAL | 1 refills | Status: DC
Start: 2022-11-24 — End: 2023-06-27

## 2022-11-24 NOTE — Progress Notes (Signed)
BP 98/62   Pulse 79   Temp 98.4 F (36.9 C) (Oral)   Wt 193 lb 9.6 oz (87.8 kg)   SpO2 99%   BMI 35.13 kg/m    Subjective:    Patient ID: Janet Rice, female    DOB: 1946-01-15, 77 y.o.   MRN: [id number]  HPI: Janet Rice is a 77 y.o. female  Chief Complaint  Patient presents with   Diabetes   Hyperlipidemia   Hypothyroidism   Depression   Anxiety   HYPOTHYROIDISM Taking daily. Except on Sunday she takes a 1/2 a pill. Thyroid control status:controlled Satisfied with current treatment? yes Medication side effects: no Medication compliance: excellent compliance Etiology of hypothyroidism:  Recent dose adjustment:no Fatigue: yes Cold intolerance: yes Heat intolerance: no Weight gain: no Weight loss: no Constipation: no Diarrhea/loose stools: no Palpitations: no Lower extremity edema: no Anxiety/depressed mood: no   HYPERLIPIDEMIA Hyperlipidemia status: excellent compliance Satisfied with current treatment?  no Side effects:  muscle aches Medication compliance: excellent compliance Past cholesterol meds: pravastatin (pravachol) Supplements: none Aspirin:  no The 10-year ASCVD risk score (Arnett DK, et al., 2019) is: 11%   Values used to calculate the score:     Age: 77 years     Sex: Female     Is Non-Hispanic African American: No     Diabetic: No     Tobacco smoker: No     Systolic Blood Pressure: 98 mmHg     Is BP treated: No     HDL Cholesterol: 57 mg/dL     Total Cholesterol: 221 mg/dL Chest pain:  no Coronary artery disease:  no Family history CAD:  no Family history early CAD:  no  ANEMIA Followed by Hematology.  Receives PRN iron infusions.  Receiving infusions as needed.  She didn't have to have an infusion at her last visit.   MOOD Feels like her mood is good.  Celexa is working well for her.  Denies concerns at visit today.   She denies any issues with the medication at this time.    Flowsheet Row Office Visit from  11/24/2022 in [facility]  PHQ-9 Total Score 0        Relevant past medical, surgical, family and social history reviewed and updated as indicated. Interim medical history since our last visit reviewed. Allergies and medications reviewed and updated.  Review of Systems  Constitutional:  Negative for fatigue and unexpected weight change.  Eyes:  Negative for visual disturbance.  Respiratory:  Negative for cough, chest tightness and shortness of breath.   Cardiovascular:  Negative for chest pain, palpitations and leg swelling.  Gastrointestinal:  Negative for constipation and diarrhea.  Endocrine: Negative for cold intolerance and heat intolerance.  Neurological:  Negative for dizziness and headaches.  Psychiatric/Behavioral:  Negative for dysphoric mood and suicidal ideas. The patient is not nervous/anxious.     Per HPI unless specifically indicated above     Objective:    BP 98/62   Pulse 79   Temp 98.4 F (36.9 C) (Oral)   Wt 193 lb 9.6 oz (87.8 kg)   SpO2 99%   BMI 35.13 kg/m   Wt Readings from Last 3 Encounters:  11/24/22 193 lb 9.6 oz (87.8 kg)  10/21/22 196 lb (88.9 kg)  09/27/22 203 lb (92.1 kg)    Physical Exam Vitals and nursing note reviewed.  Constitutional:      General: She is not in acute distress.  Appearance: Normal appearance. She is obese. She is not ill-appearing, toxic-appearing or diaphoretic.  HENT:     Head: Normocephalic.     Right Ear: External ear normal.     Left Ear: External ear normal.     Nose: Nose normal.     Mouth/Throat:     Mouth: Mucous membranes are moist.     Pharynx: Oropharynx is clear.  Eyes:     General:        Right eye: No discharge.        Left eye: No discharge.     Extraocular Movements: Extraocular movements intact.     Conjunctiva/sclera: Conjunctivae normal.     Pupils: Pupils are equal, round, and reactive to light.  Cardiovascular:     Rate and Rhythm: Normal rate and regular  rhythm.     Heart sounds: No murmur heard. Pulmonary:     Effort: Pulmonary effort is normal. No respiratory distress.     Breath sounds: Normal breath sounds. No wheezing or rales.  Musculoskeletal:     Cervical back: Normal range of motion and neck supple.  Skin:    General: Skin is warm and dry.     Capillary Refill: Capillary refill takes less than 2 seconds.  Neurological:     General: No focal deficit present.     Mental Status: She is alert and oriented to person, place, and time. Mental status is at baseline.  Psychiatric:        Mood and Affect: Mood normal.        Behavior: Behavior normal.        Thought Content: Thought content normal.        Judgment: Judgment normal.     Results for orders placed or performed in visit on 10/20/22  Iron and TIBC  Result Value Ref Range   Iron 36 28 - 170 ug/dL   TIBC 161 096 - 045 ug/dL   Saturation Ratios 11 10.4 - 31.8 %   UIBC 289 ug/dL  Ferritin  Result Value Ref Range   Ferritin 28 11 - 307 ng/mL  CBC with Differential/Platelet  Result Value Ref Range   WBC 6.1 4.0 - 10.5 K/uL   RBC 4.84 3.87 - 5.11 MIL/uL   Hemoglobin 13.8 12.0 - 15.0 g/dL   HCT 40.9 81.1 - 91.4 %   MCV 87.2 80.0 - 100.0 fL   MCH 28.5 26.0 - 34.0 pg   MCHC 32.7 30.0 - 36.0 g/dL   RDW 78.2 95.6 - 21.3 %   Platelets 146 (L) 150 - 400 K/uL   nRBC 0.0 0.0 - 0.2 %   Neutrophils Relative % 73 %   Neutro Abs 4.5 1.7 - 7.7 K/uL   Lymphocytes Relative 16 %   Lymphs Abs 1.0 0.7 - 4.0 K/uL   Monocytes Relative 7 %   Monocytes Absolute 0.4 0.1 - 1.0 K/uL   Eosinophils Relative 3 %   Eosinophils Absolute 0.2 0.0 - 0.5 K/uL   Basophils Relative 1 %   Basophils Absolute 0.1 0.0 - 0.1 K/uL   Immature Granulocytes 0 %   Abs Immature Granulocytes 0.02 0.00 - 0.07 K/uL      Assessment & Plan:   Problem List Items Addressed This Visit       Endocrine   Hypothyroidism - Primary    Chronic.  Controlled.  Continue with current medication regimen on  levothyroxine daily and 1/2 tab on Sunday.  Refills sent today.  Labs ordered  today.  Return to clinic in 6 months for reevaluation.  Call sooner if concerns arise.        Relevant Medications   levothyroxine (SYNTHROID) 125 MCG tablet   Other Relevant Orders   Comp Met (CMET)     Other   Iron deficiency anemia    Chronic.  Controlled.  Continue with current medication regimen.  Continue to follow up with Hematology.  Did not need an infusion at her last visit with Hematology.  Labs ordered today.  Return to clinic in 6 months for reevaluation.  Call sooner if concerns arise.        Relevant Orders   Comp Met (CMET)   CBC w/Diff   Hyperlipidemia    Chronic.  Controlled.  Continue with current medication regimen of Pravastatin.  Labs ordered today.  Return to clinic in 6 months for reevaluation.  Call sooner if concerns arise.        Relevant Orders   Lipid Profile   Anxiety and depression    Chronic.  Controlled.  Continue with current medication regimen of Celexa 20mg .  Labs ordered today.  Return to clinic in 6 months for reevaluation.  Call sooner if concerns arise.         Prediabetes    Labs ordered at visit today.  Will make recommendations based on lab results.        Relevant Orders   Comp Met (CMET)   HgB A1c     Follow up plan: Return in about 6 months (around 05/27/2023) for HTN, HLD, DM2 FU.

## 2022-11-24 NOTE — Assessment & Plan Note (Signed)
Chronic.  Controlled.  Continue with current medication regimen.  Continue to follow up with Hematology.  Did not need an infusion at her last visit with Hematology.  Labs ordered today.  Return to clinic in 6 months for reevaluation.  Call sooner if concerns arise.

## 2022-11-24 NOTE — Assessment & Plan Note (Signed)
Labs ordered at visit today.  Will make recommendations based on lab results.   

## 2022-11-24 NOTE — Assessment & Plan Note (Signed)
Chronic.  Controlled.  Continue with current medication regimen of Pravastatin.  Labs ordered today.  Return to clinic in 6 months for reevaluation.  Call sooner if concerns arise.   

## 2022-11-24 NOTE — Assessment & Plan Note (Signed)
Chronic.  Controlled.  Continue with current medication regimen of Celexa 20mg.  Labs ordered today.  Return to clinic in 6 months for reevaluation.  Call sooner if concerns arise.   

## 2022-11-24 NOTE — Assessment & Plan Note (Signed)
Chronic.  Controlled.  Continue with current medication regimen on levothyroxine daily and 1/2 tab on Sunday.  Refills sent today.  Labs ordered today.  Return to clinic in 6 months for reevaluation.  Call sooner if concerns arise.

## 2022-11-25 LAB — LIPID PANEL
Chol/HDL Ratio: 3.5 ratio (ref 0.0–4.4)
Cholesterol, Total: 187 mg/dL (ref 100–199)
HDL: 53 mg/dL (ref >39)
LDL Chol Calc (NIH): 107 mg/dL — ABNORMAL HIGH (ref 0–99)
Triglycerides: 154 mg/dL — ABNORMAL HIGH (ref 0–149)
VLDL Cholesterol Cal: 27 mg/dL (ref 5–40)

## 2022-11-25 LAB — COMPREHENSIVE METABOLIC PANEL
ALT: 22 IU/L (ref 0–32)
AST: 24 IU/L (ref 0–40)
Albumin/Globulin Ratio: 1.6 (ref 1.2–2.2)
Albumin: 4.2 g/dL (ref 3.8–4.8)
Alkaline Phosphatase: 132 IU/L — ABNORMAL HIGH (ref 44–121)
BUN/Creatinine Ratio: 16 (ref 12–28)
BUN: 18 mg/dL (ref 8–27)
Bilirubin Total: 0.3 mg/dL (ref 0.0–1.2)
CO2: 26 mmol/L (ref 20–29)
Calcium: 9.7 mg/dL (ref 8.7–10.3)
Chloride: 101 mmol/L (ref 96–106)
Creatinine, Ser: 1.12 mg/dL — ABNORMAL HIGH (ref 0.57–1.00)
Globulin, Total: 2.7 g/dL (ref 1.5–4.5)
Glucose: 117 mg/dL — ABNORMAL HIGH (ref 70–99)
Potassium: 5 mmol/L (ref 3.5–5.2)
Sodium: 140 mmol/L (ref 134–144)
Total Protein: 6.9 g/dL (ref 6.0–8.5)
eGFR: 51 mL/min/{1.73_m2} — ABNORMAL LOW (ref >59)

## 2022-11-25 LAB — HEMOGLOBIN A1C
Est. average glucose Bld gHb Est-mCnc: 117 mg/dL
Hgb A1c MFr Bld: 5.7 % — ABNORMAL HIGH (ref 4.8–5.6)

## 2022-11-25 LAB — CBC WITH DIFFERENTIAL/PLATELET
Basophils Absolute: 0 10*3/uL (ref 0.0–0.2)
Basos: 1 %
EOS (ABSOLUTE): 0.2 10*3/uL (ref 0.0–0.4)
Eos: 2 %
Hematocrit: 40.1 % (ref 34.0–46.6)
Hemoglobin: 13.4 g/dL (ref 11.1–15.9)
Immature Grans (Abs): 0 10*3/uL (ref 0.0–0.1)
Immature Granulocytes: 0 %
Lymphocytes Absolute: 1.1 10*3/uL (ref 0.7–3.1)
Lymphs: 17 %
MCH: 28.6 pg (ref 26.6–33.0)
MCHC: 33.4 g/dL (ref 31.5–35.7)
MCV: 86 fL (ref 79–97)
Monocytes Absolute: 0.4 10*3/uL (ref 0.1–0.9)
Monocytes: 6 %
Neutrophils Absolute: 4.9 10*3/uL (ref 1.4–7.0)
Neutrophils: 74 %
Platelets: 143 10*3/uL — ABNORMAL LOW (ref 150–450)
RBC: 4.69 x10E6/uL (ref 3.77–5.28)
RDW: 14 % (ref 11.7–15.4)
WBC: 6.6 10*3/uL (ref 3.4–10.8)

## 2022-11-25 NOTE — Progress Notes (Signed)
Hi Janet Rice.  Your lab work looks good.  Kidney function remains stable.  A1c is well controlled at 5.7%.  No concerns a this time.  Please continue with your current medication regimen.  Follow up as discussed.

## 2023-01-06 DIAGNOSIS — H26491 Other secondary cataract, right eye: Secondary | ICD-10-CM | POA: Diagnosis not present

## 2023-01-18 DIAGNOSIS — M47816 Spondylosis without myelopathy or radiculopathy, lumbar region: Secondary | ICD-10-CM | POA: Diagnosis not present

## 2023-01-20 DIAGNOSIS — M1712 Unilateral primary osteoarthritis, left knee: Secondary | ICD-10-CM | POA: Diagnosis not present

## 2023-01-24 ENCOUNTER — Inpatient Hospital Stay: Payer: Medicare Other | Attending: Nurse Practitioner

## 2023-01-24 ENCOUNTER — Other Ambulatory Visit: Payer: Self-pay

## 2023-01-24 DIAGNOSIS — Z08 Encounter for follow-up examination after completed treatment for malignant neoplasm: Secondary | ICD-10-CM | POA: Diagnosis not present

## 2023-01-24 DIAGNOSIS — D509 Iron deficiency anemia, unspecified: Secondary | ICD-10-CM | POA: Diagnosis not present

## 2023-01-24 DIAGNOSIS — Z85820 Personal history of malignant melanoma of skin: Secondary | ICD-10-CM | POA: Diagnosis not present

## 2023-01-24 LAB — CBC WITH DIFFERENTIAL/PLATELET
Abs Immature Granulocytes: 0.02 10*3/uL (ref 0.00–0.07)
Basophils Absolute: 0 10*3/uL (ref 0.0–0.1)
Basophils Relative: 0 %
Eosinophils Absolute: 0.2 10*3/uL (ref 0.0–0.5)
Eosinophils Relative: 2 %
HCT: 42.5 % (ref 36.0–46.0)
Hemoglobin: 13.7 g/dL (ref 12.0–15.0)
Immature Granulocytes: 0 %
Lymphocytes Relative: 14 %
Lymphs Abs: 1.2 10*3/uL (ref 0.7–4.0)
MCH: 28.2 pg (ref 26.0–34.0)
MCHC: 32.2 g/dL (ref 30.0–36.0)
MCV: 87.4 fL (ref 80.0–100.0)
Monocytes Absolute: 0.4 10*3/uL (ref 0.1–1.0)
Monocytes Relative: 4 %
Neutro Abs: 6.6 10*3/uL (ref 1.7–7.7)
Neutrophils Relative %: 80 %
Platelets: 149 10*3/uL — ABNORMAL LOW (ref 150–400)
RBC: 4.86 MIL/uL (ref 3.87–5.11)
RDW: 14.1 % (ref 11.5–15.5)
WBC: 8.3 10*3/uL (ref 4.0–10.5)
nRBC: 0 % (ref 0.0–0.2)

## 2023-01-24 LAB — IRON AND TIBC
Iron: 38 ug/dL (ref 28–170)
Saturation Ratios: 11 % (ref 10.4–31.8)
TIBC: 343 ug/dL (ref 250–450)
UIBC: 305 ug/dL

## 2023-01-24 LAB — FERRITIN: Ferritin: 24 ng/mL (ref 11–307)

## 2023-01-25 ENCOUNTER — Inpatient Hospital Stay: Payer: Medicare Other | Admitting: Oncology

## 2023-01-25 ENCOUNTER — Encounter: Payer: Self-pay | Admitting: Oncology

## 2023-01-25 ENCOUNTER — Inpatient Hospital Stay: Payer: Medicare Other

## 2023-01-25 VITALS — BP 110/63 | HR 71 | Temp 97.8°F | Resp 16 | Ht 62.25 in | Wt 184.0 lb

## 2023-01-25 DIAGNOSIS — D509 Iron deficiency anemia, unspecified: Secondary | ICD-10-CM

## 2023-01-25 DIAGNOSIS — Z08 Encounter for follow-up examination after completed treatment for malignant neoplasm: Secondary | ICD-10-CM | POA: Diagnosis not present

## 2023-01-25 DIAGNOSIS — Z85820 Personal history of malignant melanoma of skin: Secondary | ICD-10-CM | POA: Diagnosis not present

## 2023-01-25 MED FILL — Ferumoxytol Inj 510 MG/17ML (30 MG/ML) (Elemental Fe): INTRAVENOUS | Qty: 17 | Status: AC

## 2023-01-25 NOTE — Progress Notes (Unsigned)
[facility]  Telephone:([phone]) [phone]  [phone]     Janet Rice DOB: Jun 23, 1946  MR#: [id number]  ZDG#:[id number]  Patient Care Team: Larae Grooms, NP as PCP - General (Nurse Practitioner) Jeralyn Ruths, MD as Consulting Physician (Hematology and Oncology)   CHIEF COMPLAINT: Iron deficiency anemia.  INTERVAL HISTORY: Patient returns to clinic today for repeat laboratory work, further evaluation, and consideration of additional IV Feraheme.  She currently feels well and is asymptomatic.  She does not complain of any weakness or fatigue today.  She has no neurologic complaints. She denies any recent fevers or illnesses. She has a good appetite and denies weight loss.  She denies any chest pain, shortness of breath, cough, or hemoptysis.  She denies any nausea, vomiting, constipation, or diarrhea. She has no melena or hematochezia. She has no urinary complaints.  Patient offers no specific complaints today.  REVIEW OF SYSTEMS:   Review of Systems  Constitutional: Negative.  Negative for fever, malaise/fatigue and weight loss.  Eyes: Negative.   Respiratory: Negative.  Negative for cough and shortness of breath.   Cardiovascular: Negative.  Negative for chest pain and leg swelling.  Gastrointestinal: Negative.  Negative for abdominal pain, blood in stool and melena.  Genitourinary: Negative.  Negative for hematuria.  Musculoskeletal: Negative.  Negative for back pain and joint pain.  Skin: Negative.  Negative for rash.  Neurological: Negative.  Negative for dizziness, sensory change, focal weakness and weakness.  Psychiatric/Behavioral: Negative.  The patient is not nervous/anxious.     As per HPI. Otherwise, a complete review of systems is negative.  ONCOLOGY HISTORY: Oncology History   No history exists.    PAST MEDICAL HISTORY: Past Medical History:  Diagnosis Date   Adenomatous colon polyp    Anemia    Arthritis    Basal cell  carcinoma    left supratip of nose nodular edges involved 08/27/20 Dr. Roseanne Kaufman, left postauricular sulcus nodular bcc 01/02/20 exc 01/23/20 neg margins   Blood transfusion without reported diagnosis    Cataract    Childhood asthma    AS A CHILD   Chronic heartburn    On omeprazole   Complication of anesthesia    HARD TO WAKE UP   Dyspnea    low hgb. and iron  chronic problem   External hemorrhoids    Frequency of urination    Frequent loose stools    GERD (gastroesophageal reflux disease)    Heme positive stool    History of hiatal hernia    large   Hyperlipidemia    Hypothyroidism    Iron deficiency anemia    Melanoma (HCC)    left thigh s/p LN removal left groin   Metastatic melanoma (HCC)    Followed by Dr Doylene Canning, previous chemo, no recurrence, 2008?   Multinodular goiter    Osteoporosis    SCC (squamous cell carcinoma)    skin    PAST SURGICAL HISTORY: Past Surgical History:  Procedure Laterality Date   ABDOMINAL HYSTERECTOMY  1984   BACK SURGERY  03/2016   LUMBAR   CARPAL TUNNEL RELEASE Right 09/07/2016   Procedure: CARPAL TUNNEL RELEASE;  Surgeon: Juanell Fairly, MD;  Location: [facility] ORS;  Service: Orthopedics;  Laterality: Right;   CHOLECYSTECTOMY  1990   COLONOSCOPY  08/2006   Four sessile polyps found and removed in sigmoid colon at splenic flexure and ascending colon. 7 mm in size. Another removed from transverse colon 4 mm. Path Report - showed tubular  adenoma and hyperplastic polyp. Advised to repeat in 3.5 years (02/2010)   COLONOSCOPY  3.2.2011   8 mm polyp in sigmoid colon, removed, 4 mm polyp in descending colon, removed. Internal hemorrhoids   ESOPHAGOGASTRODUODENOSCOPY  3.2.2011   Large hiatia hernia, esophagus normal, mulitple small sessile polyps w/no stigmata of recent bleeding found. Mildy erythermatous mucosa w/no bleeding found in gatric antrum. Normal duodenum. 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 PATH - no active inflammation,  antral mucosa w/mild foveolar hyperplasia   FRACTURE SURGERY     Lymph Node Removal  12/2005   Left inguinal lymph node dissection   MELANOMA EXCISION Left 1993   Left thigh   ORIF ANKLE FRACTURE Right    Dr. Martha Clan   TOTAL HIP ARTHROPLASTY Right 11/18/2016   Procedure: TOTAL HIP ARTHROPLASTY;  Surgeon: Juanell Fairly, MD;  Location: [facility] ORS;  Service: Orthopedics;  Laterality: Right;   TOTAL HIP ARTHROPLASTY     left    FAMILY HISTORY Family History  Problem Relation Age of Onset   Aneurysm Mother    Heart disease Mother    Colon polyps Father    Diabetes Father    Dementia Father    Stroke Father        TIA   Macular degeneration Father    Hypothyroidism Daughter    Hypothyroidism Son    Leukemia Maternal Grandfather    Skin cancer Paternal Grandfather    Diabetes Other    Colon polyps Other    Colon cancer Neg Hx    Breast cancer Neg Hx    Stomach cancer Neg Hx    Rectal cancer Neg Hx    Esophageal cancer Neg Hx     GYNECOLOGIC HISTORY:  No LMP recorded. Patient has had a hysterectomy.     ADVANCED DIRECTIVES:    HEALTH MAINTENANCE: Social History   Tobacco Use   Smoking status: Former    Current packs/day: 0.00    Average packs/day: 0.5 packs/day for 8.0 years (4.0 ttl pk-yrs)    Types: Cigarettes    Start date: 07/12/1970    Quit date: 07/12/1978    Years since quitting: 44.5   Smokeless tobacco: Never  Vaping Use   Vaping status: Never Used  Substance Use Topics   Alcohol use: No    Alcohol/week: 0.0 standard drinks of alcohol   Drug use: No    Allergies  Allergen Reactions   Misc. Sulfonamide Containing Compounds Other (See Comments)   Sulfa Antibiotics Rash    Current Outpatient Medications  Medication Sig Dispense Refill   acetaminophen (TYLENOL) 650 MG CR tablet Take 1,300 mg by mouth daily.     B Complex Vitamins (B COMPLEX PO) Take by mouth.     Calcium Carbonate-Vitamin D 600-400 MG-UNIT tablet Take by mouth.     citalopram  (CELEXA) 20 MG tablet Take 1 tablet (20 mg total) by mouth daily. 90 tablet 3   desonide (DESOWEN) 0.05 % cream Apply topically 2 (two) times daily.     Ferrous Sulfate Dried 45 MG TBCR Take 45 mg by mouth daily with lunch.     levothyroxine (SYNTHROID) 125 MCG tablet Take 1 tablet (125 mcg total) by mouth daily before breakfast. 84 tablet 1   loperamide (IMODIUM) 2 MG capsule Take 2 mg by mouth every morning.     Multiple Vitamins-Minerals (EYE VITAMINS & MINERALS) TABS Take 1 tablet by mouth daily.     pravastatin (PRAVACHOL) 40 MG tablet Take 0.5 tablets (20  mg total) by mouth daily. 45 tablet 3   No current facility-administered medications for this visit.   Facility-Administered Medications Ordered in Other Visits  Medication Dose Route Frequency Provider Last Rate Last Admin   0.9 %  sodium chloride infusion   Intravenous PRN Jeralyn Ruths, MD   Stopped at 03/13/21 0940    OBJECTIVE: BP 110/63 (BP Location: Left Arm, Patient Position: Sitting, Cuff Size: Normal)   Pulse 71   Temp 97.8 F (36.6 C) (Tympanic)   Resp 16   Ht 5' 2.25" (1.581 m)   Wt 184 lb (83.5 kg)   SpO2 96%   BMI 33.38 kg/m    Body mass index is 33.38 kg/m.    ECOG FS:0 - Asymptomatic  General: Well-developed, well-nourished, no acute distress. Eyes: Pink conjunctiva, anicteric sclera. HEENT: Normocephalic, moist mucous membranes. Lungs: No audible wheezing or coughing. Heart: Regular rate and rhythm. Abdomen: Soft, nontender, no obvious distention. Musculoskeletal: No edema, cyanosis, or clubbing. Neuro: Alert, answering all questions appropriately. Cranial nerves grossly intact. Skin: No rashes or petechiae noted. Psych: Normal affect.  LAB RESULTS:  CBC    Component Value Date/Time   WBC 8.3 01/24/2023 1330   RBC 4.86 01/24/2023 1330   HGB 13.7 01/24/2023 1330   HGB 13.4 11/24/2022 1438   HCT 42.5 01/24/2023 1330   HCT 40.1 11/24/2022 1438   PLT 149 (L) 01/24/2023 1330   PLT 143 (L)  11/24/2022 1438   MCV 87.4 01/24/2023 1330   MCV 86 11/24/2022 1438   MCV 74 (L) 12/12/2013 1015   MCH 28.2 01/24/2023 1330   MCHC 32.2 01/24/2023 1330   RDW 14.1 01/24/2023 1330   RDW 14.0 11/24/2022 1438   RDW 33.6 (H) 12/12/2013 1015   LYMPHSABS 1.2 01/24/2023 1330   LYMPHSABS 1.1 11/24/2022 1438   LYMPHSABS 1.2 12/12/2013 1015   MONOABS 0.4 01/24/2023 1330   MONOABS 0.4 12/12/2013 1015   EOSABS 0.2 01/24/2023 1330   EOSABS 0.2 11/24/2022 1438   EOSABS 0.1 12/12/2013 1015   BASOSABS 0.0 01/24/2023 1330   BASOSABS 0.0 11/24/2022 1438   BASOSABS 0.1 12/12/2013 1015   Lab Results  Component Value Date   IRON 38 01/24/2023   TIBC 343 01/24/2023   IRONPCTSAT 11 01/24/2023   Lab Results  Component Value Date   FERRITIN 24 01/24/2023      STUDIES: No results found.  ASSESSMENT & PLAN:    Iron deficiency anemia: Resolved.  Patient's hemoglobin and iron stores continue to be within normal limits.  Colonoscopy and EGD on May 02, 2020 did not reveal any distinct pathology.  She does not require additional IV Feraheme today.  Patient last received treatment on July 22, 2022.  Return to clinic in 4 months with repeat laboratory, further evaluation, and continuation of treatment if needed.   History of melanoma: Melanoma of left thigh status post resection in 1993, exact stage is not known, metastatic to inguinal lymph node. No evidence of recurrent disease.  I spent a total of 20 minutes reviewing chart data, face-to-face evaluation with the patient, counseling and coordination of care as detailed above.   Patient expressed understanding and was in agreement with this plan. She also understands that She can call clinic at any time with any questions, concerns, or complaints.    Jeralyn Ruths, MD   01/25/2023 2:41 PM

## 2023-01-26 ENCOUNTER — Encounter: Payer: Self-pay | Admitting: Oncology

## 2023-02-08 DIAGNOSIS — M47816 Spondylosis without myelopathy or radiculopathy, lumbar region: Secondary | ICD-10-CM | POA: Diagnosis not present

## 2023-02-17 DIAGNOSIS — M1712 Unilateral primary osteoarthritis, left knee: Secondary | ICD-10-CM | POA: Diagnosis not present

## 2023-03-04 DIAGNOSIS — M47816 Spondylosis without myelopathy or radiculopathy, lumbar region: Secondary | ICD-10-CM | POA: Diagnosis not present

## 2023-03-23 DIAGNOSIS — M25562 Pain in left knee: Secondary | ICD-10-CM | POA: Diagnosis not present

## 2023-03-30 ENCOUNTER — Encounter: Payer: Self-pay | Admitting: Nurse Practitioner

## 2023-03-30 ENCOUNTER — Encounter: Payer: Self-pay | Admitting: Oncology

## 2023-04-07 DIAGNOSIS — M84352A Stress fracture, left femur, initial encounter for fracture: Secondary | ICD-10-CM | POA: Diagnosis not present

## 2023-04-07 DIAGNOSIS — M25562 Pain in left knee: Secondary | ICD-10-CM | POA: Diagnosis not present

## 2023-04-07 DIAGNOSIS — S83232A Complex tear of medial meniscus, current injury, left knee, initial encounter: Secondary | ICD-10-CM | POA: Diagnosis not present

## 2023-04-07 DIAGNOSIS — M84362A Stress fracture, left tibia, initial encounter for fracture: Secondary | ICD-10-CM | POA: Diagnosis not present

## 2023-04-13 DIAGNOSIS — M25562 Pain in left knee: Secondary | ICD-10-CM | POA: Diagnosis not present

## 2023-04-15 DIAGNOSIS — M47816 Spondylosis without myelopathy or radiculopathy, lumbar region: Secondary | ICD-10-CM | POA: Diagnosis not present

## 2023-04-15 DIAGNOSIS — M5136 Other intervertebral disc degeneration, lumbar region with discogenic back pain only: Secondary | ICD-10-CM | POA: Diagnosis not present

## 2023-04-15 DIAGNOSIS — M5416 Radiculopathy, lumbar region: Secondary | ICD-10-CM | POA: Diagnosis not present

## 2023-04-18 DIAGNOSIS — Z23 Encounter for immunization: Secondary | ICD-10-CM | POA: Diagnosis not present

## 2023-05-22 ENCOUNTER — Other Ambulatory Visit: Payer: Self-pay | Admitting: Nurse Practitioner

## 2023-05-22 DIAGNOSIS — F32A Depression, unspecified: Secondary | ICD-10-CM

## 2023-05-23 NOTE — Telephone Encounter (Signed)
Requested Prescriptions  Pending Prescriptions Disp Refills   citalopram (CELEXA) 20 MG tablet [Pharmacy Med Name: CITALOPRAM TAB 20MG ] 90 tablet 3    Sig: TAKE 1 TABLET DAILY     Psychiatry:  Antidepressants - SSRI Passed - 05/22/2023  8:09 AM      Passed - Completed PHQ-2 or PHQ-9 in the last 360 days      Passed - Valid encounter within last 6 months    Recent Outpatient Visits           6 months ago Hypothyroidism, unspecified type   [facility] Larae Grooms, NP   12 months ago Hypothyroidism, unspecified type   [facility] Larae Grooms, NP   1 year ago Hypothyroidism, unspecified type   [facility] Larae Grooms, NP   1 year ago Upper respiratory tract infection, unspecified type   [facility] McElwee, Lauren A, NP   2 years ago Hypothyroidism, unspecified type   [facility] Larae Grooms, NP       Future Appointments             In 1 week Larae Grooms, NP [facility] [facility]

## 2023-05-25 DIAGNOSIS — M1712 Unilateral primary osteoarthritis, left knee: Secondary | ICD-10-CM | POA: Diagnosis not present

## 2023-05-25 DIAGNOSIS — M8438XA Stress fracture, other site, initial encounter for fracture: Secondary | ICD-10-CM | POA: Diagnosis not present

## 2023-05-30 ENCOUNTER — Ambulatory Visit: Payer: Medicare Other | Admitting: Nurse Practitioner

## 2023-05-30 NOTE — Progress Notes (Deleted)
There were no vitals taken for this visit.   Subjective:    Patient ID: Janet Rice, female    DOB: 1945/09/20, 77 y.o.   MRN: [id number]  HPI: Janet Rice is a 77 y.o. female  No chief complaint on file.  HYPOTHYROIDISM Taking daily. Except on Sunday she takes a 1/2 a pill. Thyroid control status:controlled Satisfied with current treatment? yes Medication side effects: no Medication compliance: excellent compliance Etiology of hypothyroidism:  Recent dose adjustment:no Fatigue: yes Cold intolerance: yes Heat intolerance: no Weight gain: no Weight loss: no Constipation: no Diarrhea/loose stools: no Palpitations: no Lower extremity edema: no Anxiety/depressed mood: no   HYPERLIPIDEMIA Hyperlipidemia status: excellent compliance Satisfied with current treatment?  no Side effects:  muscle aches Medication compliance: excellent compliance Past cholesterol meds: pravastatin (pravachol) Supplements: none Aspirin:  no The 10-year ASCVD risk score (Arnett DK, et al., 2019) is: 16.2%   Values used to calculate the score:     Age: 63 years     Sex: Female     Is Non-Hispanic African American: No     Diabetic: No     Tobacco smoker: No     Systolic Blood Pressure: 115 mmHg     Is BP treated: No     HDL Cholesterol: 53 mg/dL     Total Cholesterol: 187 mg/dL Chest pain:  no Coronary artery disease:  no Family history CAD:  no Family history early CAD:  no  ANEMIA Followed by Hematology.  Receives PRN iron infusions.  Receiving infusions as needed.  She didn't have to have an infusion at her last visit.   MOOD Feels like her mood is good.  Celexa is working well for her.  Denies concerns at visit today.   She denies any issues with the medication at this time.    Flowsheet Row Office Visit from 11/24/2022 in [facility]  PHQ-9 Total Score 0        Relevant past medical, surgical, family and social history reviewed  and updated as indicated. Interim medical history since our last visit reviewed. Allergies and medications reviewed and updated.  Review of Systems  Constitutional:  Negative for fatigue and unexpected weight change.  Eyes:  Negative for visual disturbance.  Respiratory:  Negative for cough, chest tightness and shortness of breath.   Cardiovascular:  Negative for chest pain, palpitations and leg swelling.  Gastrointestinal:  Negative for constipation and diarrhea.  Endocrine: Negative for cold intolerance and heat intolerance.  Neurological:  Negative for dizziness and headaches.  Psychiatric/Behavioral:  Negative for dysphoric mood and suicidal ideas. The patient is not nervous/anxious.     Per HPI unless specifically indicated above     Objective:    There were no vitals taken for this visit.  Wt Readings from Last 3 Encounters:  01/25/23 184 lb (83.5 kg)  11/24/22 193 lb 9.6 oz (87.8 kg)  10/21/22 196 lb (88.9 kg)    Physical Exam Vitals and nursing note reviewed.  Constitutional:      General: She is not in acute distress.    Appearance: Normal appearance. She is obese. She is not ill-appearing, toxic-appearing or diaphoretic.  HENT:     Head: Normocephalic.     Right Ear: External ear normal.     Left Ear: External ear normal.     Nose: Nose normal.     Mouth/Throat:     Mouth: Mucous membranes are moist.     Pharynx: Oropharynx  is clear.  Eyes:     General:        Right eye: No discharge.        Left eye: No discharge.     Extraocular Movements: Extraocular movements intact.     Conjunctiva/sclera: Conjunctivae normal.     Pupils: Pupils are equal, round, and reactive to light.  Cardiovascular:     Rate and Rhythm: Normal rate and regular rhythm.     Heart sounds: No murmur heard. Pulmonary:     Effort: Pulmonary effort is normal. No respiratory distress.     Breath sounds: Normal breath sounds. No wheezing or rales.  Musculoskeletal:     Cervical back:  Normal range of motion and neck supple.  Skin:    General: Skin is warm and dry.     Capillary Refill: Capillary refill takes less than 2 seconds.  Neurological:     General: No focal deficit present.     Mental Status: She is alert and oriented to person, place, and time. Mental status is at baseline.  Psychiatric:        Mood and Affect: Mood normal.        Behavior: Behavior normal.        Thought Content: Thought content normal.        Judgment: Judgment normal.    Results for orders placed or performed in visit on 01/24/23  Iron and TIBC  Result Value Ref Range   Iron 38 28 - 170 ug/dL   TIBC 536 644 - 034 ug/dL   Saturation Ratios 11 10.4 - 31.8 %   UIBC 305 ug/dL  Ferritin  Result Value Ref Range   Ferritin 24 11 - 307 ng/mL  CBC with Differential/Platelet  Result Value Ref Range   WBC 8.3 4.0 - 10.5 K/uL   RBC 4.86 3.87 - 5.11 MIL/uL   Hemoglobin 13.7 12.0 - 15.0 g/dL   HCT 74.2 59.5 - 63.8 %   MCV 87.4 80.0 - 100.0 fL   MCH 28.2 26.0 - 34.0 pg   MCHC 32.2 30.0 - 36.0 g/dL   RDW 75.6 43.3 - 29.5 %   Platelets 149 (L) 150 - 400 K/uL   nRBC 0.0 0.0 - 0.2 %   Neutrophils Relative % 80 %   Neutro Abs 6.6 1.7 - 7.7 K/uL   Lymphocytes Relative 14 %   Lymphs Abs 1.2 0.7 - 4.0 K/uL   Monocytes Relative 4 %   Monocytes Absolute 0.4 0.1 - 1.0 K/uL   Eosinophils Relative 2 %   Eosinophils Absolute 0.2 0.0 - 0.5 K/uL   Basophils Relative 0 %   Basophils Absolute 0.0 0.0 - 0.1 K/uL   Immature Granulocytes 0 %   Abs Immature Granulocytes 0.02 0.00 - 0.07 K/uL      Assessment & Plan:   Problem List Items Addressed This Visit       Endocrine   Hypothyroidism     Other   Iron deficiency anemia   Hyperlipidemia - Primary   Obesity (BMI 30-39.9)   Prediabetes     Follow up plan: No follow-ups on file.

## 2023-06-03 ENCOUNTER — Inpatient Hospital Stay: Payer: Medicare Other

## 2023-06-03 DIAGNOSIS — M1712 Unilateral primary osteoarthritis, left knee: Secondary | ICD-10-CM | POA: Diagnosis not present

## 2023-06-06 ENCOUNTER — Other Ambulatory Visit: Payer: Medicare Other

## 2023-06-07 ENCOUNTER — Inpatient Hospital Stay: Payer: Medicare Other

## 2023-06-07 ENCOUNTER — Ambulatory Visit: Payer: Medicare Other | Admitting: Oncology

## 2023-06-16 ENCOUNTER — Encounter: Payer: Self-pay | Admitting: Oncology

## 2023-06-27 ENCOUNTER — Ambulatory Visit (INDEPENDENT_AMBULATORY_CARE_PROVIDER_SITE_OTHER): Payer: Medicare Other | Admitting: Nurse Practitioner

## 2023-06-27 ENCOUNTER — Encounter: Payer: Self-pay | Admitting: Nurse Practitioner

## 2023-06-27 VITALS — BP 115/69 | HR 59 | Temp 98.5°F | Ht 63.0 in | Wt 179.2 lb

## 2023-06-27 DIAGNOSIS — F32A Depression, unspecified: Secondary | ICD-10-CM

## 2023-06-27 DIAGNOSIS — D649 Anemia, unspecified: Secondary | ICD-10-CM

## 2023-06-27 DIAGNOSIS — C4372 Malignant melanoma of left lower limb, including hip: Secondary | ICD-10-CM | POA: Diagnosis not present

## 2023-06-27 DIAGNOSIS — R7303 Prediabetes: Secondary | ICD-10-CM | POA: Diagnosis not present

## 2023-06-27 DIAGNOSIS — E669 Obesity, unspecified: Secondary | ICD-10-CM | POA: Diagnosis not present

## 2023-06-27 DIAGNOSIS — F419 Anxiety disorder, unspecified: Secondary | ICD-10-CM

## 2023-06-27 DIAGNOSIS — E039 Hypothyroidism, unspecified: Secondary | ICD-10-CM | POA: Diagnosis not present

## 2023-06-27 DIAGNOSIS — E785 Hyperlipidemia, unspecified: Secondary | ICD-10-CM

## 2023-06-27 MED ORDER — CITALOPRAM HYDROBROMIDE 20 MG PO TABS: 20.0000 mg | ORAL_TABLET | Freq: Every day | ORAL | 0 refills | Status: DC

## 2023-06-27 MED ORDER — PRAVASTATIN SODIUM 20 MG PO TABS: 20.0000 mg | ORAL_TABLET | Freq: Every day | ORAL | 1 refills | Status: DC

## 2023-06-27 MED ORDER — LEVOTHYROXINE SODIUM 125 MCG PO TABS: 125.0000 ug | ORAL_TABLET | Freq: Every day | ORAL | 1 refills | Status: DC

## 2023-06-27 NOTE — Assessment & Plan Note (Signed)
Chronic.  Controlled.  Continue with current medication regimen on levothyroxine daily and 1/2 tab on Sunday.  Refills sent today.  Labs ordered today.  Return to clinic in 6 months for reevaluation.  Call sooner if concerns arise.

## 2023-06-27 NOTE — Assessment & Plan Note (Signed)
Chronic.  Controlled.  Continue with current medication regimen of Pravastatin.  Labs ordered today.  Return to clinic in 6 months for reevaluation.  Call sooner if concerns arise.   

## 2023-06-27 NOTE — Assessment & Plan Note (Signed)
Chronic.  Has not followed up with Hematology since July.  Labs ordered today. Will make recommendations based on results.

## 2023-06-27 NOTE — Assessment & Plan Note (Signed)
Recommended eating smaller high protein, low fat meals more frequently and exercising 30 mins a day 5 times a week with a goal of 10-15lb weight loss in the next 3 months.  

## 2023-06-27 NOTE — Assessment & Plan Note (Signed)
Labs ordered at visit today.  Will make recommendations based on lab results.   

## 2023-06-27 NOTE — Assessment & Plan Note (Signed)
Chronic.  Controlled.  Continue with current medication regimen of Celexa '20mg'$ .  Labs ordered today.  Return to clinic in 6 months for reevaluation.  Call sooner if concerns arise.

## 2023-06-27 NOTE — Progress Notes (Signed)
BP 115/69 (BP Location: Left Arm, Patient Position: Sitting, Cuff Size: Normal)   Pulse (!) 59   Temp 98.5 F (36.9 C) (Oral)   Ht 5\' 3"  (1.6 m)   Wt 179 lb 3.2 oz (81.3 kg)   SpO2 95%   BMI 31.74 kg/m    Subjective:    Patient ID: Janet Rice, female    DOB: 06-25-1946, 77 y.o.   MRN: [id number]  HPI: Janet Rice is a 77 y.o. female  Chief Complaint  Patient presents with   Medication Refill    LEXAPRO, CITALOPRAM, LEVOTHYROXINE    HYPOTHYROIDISM Taking daily. Except on Sunday she takes a 1/2 a pill. Thyroid control status:controlled Satisfied with current treatment? yes Medication side effects: no Medication compliance: excellent compliance Etiology of hypothyroidism:  Recent dose adjustment:no Fatigue: yes Cold intolerance: yes Heat intolerance: no Weight gain: no Weight loss: no Constipation: no Diarrhea/loose stools: no Palpitations: no Lower extremity edema: no Anxiety/depressed mood: no   HYPERLIPIDEMIA Hyperlipidemia status: excellent compliance Satisfied with current treatment?  no Side effects:  muscle aches Medication compliance: excellent compliance Past cholesterol meds: pravastatin (pravachol)- had side effects at 40mg  had been taking 20mg  and tolerating it well.   Supplements: none Aspirin:  no The 10-year ASCVD risk score (Arnett DK, et al., 2019) is: 16.2%   Values used to calculate the score:     Age: 54 years     Sex: Female     Is Non-Hispanic African American: No     Diabetic: No     Tobacco smoker: No     Systolic Blood Pressure: 115 mmHg     Is BP treated: No     HDL Cholesterol: 53 mg/dL     Total Cholesterol: 187 mg/dL Chest pain:  no Coronary artery disease:  no Family history CAD:  no Family history early CAD:  no  ANEMIA Followed by Hematology.  Receives PRN iron infusions.  Receiving infusions as needed.  She hasn't been seen since July.    MOOD Feels like her mood is good.  Celexa is working  well for her.  Denies concerns at visit today.   She denies any issues with the medication at this time.  She feels like this is a good dose for her.   Flowsheet Row Office Visit from 11/24/2022 in [facility]  PHQ-9 Total Score 0        Relevant past medical, surgical, family and social history reviewed and updated as indicated. Interim medical history since our last visit reviewed. Allergies and medications reviewed and updated.  Review of Systems  Constitutional:  Negative for fatigue and unexpected weight change.  Eyes:  Negative for visual disturbance.  Respiratory:  Negative for cough, chest tightness and shortness of breath.   Cardiovascular:  Negative for chest pain, palpitations and leg swelling.  Gastrointestinal:  Negative for constipation and diarrhea.  Endocrine: Negative for cold intolerance and heat intolerance.  Neurological:  Negative for dizziness and headaches.  Psychiatric/Behavioral:  Negative for dysphoric mood and suicidal ideas. The patient is not nervous/anxious.     Per HPI unless specifically indicated above     Objective:    BP 115/69 (BP Location: Left Arm, Patient Position: Sitting, Cuff Size: Normal)   Pulse (!) 59   Temp 98.5 F (36.9 C) (Oral)   Ht 5\' 3"  (1.6 m)   Wt 179 lb 3.2 oz (81.3 kg)   SpO2 95%   BMI 31.74 kg/m  Wt Readings from Last 3 Encounters:  06/27/23 179 lb 3.2 oz (81.3 kg)  01/25/23 184 lb (83.5 kg)  11/24/22 193 lb 9.6 oz (87.8 kg)    Physical Exam Vitals and nursing note reviewed.  Constitutional:      General: She is not in acute distress.    Appearance: Normal appearance. She is obese. She is not ill-appearing, toxic-appearing or diaphoretic.  HENT:     Head: Normocephalic.     Right Ear: External ear normal.     Left Ear: External ear normal.     Nose: Nose normal.     Mouth/Throat:     Mouth: Mucous membranes are moist.     Pharynx: Oropharynx is clear.  Eyes:     General:         Right eye: No discharge.        Left eye: No discharge.     Extraocular Movements: Extraocular movements intact.     Conjunctiva/sclera: Conjunctivae normal.     Pupils: Pupils are equal, round, and reactive to light.  Cardiovascular:     Rate and Rhythm: Normal rate and regular rhythm.     Heart sounds: No murmur heard. Pulmonary:     Effort: Pulmonary effort is normal. No respiratory distress.     Breath sounds: Normal breath sounds. No wheezing or rales.  Musculoskeletal:     Cervical back: Normal range of motion and neck supple.  Skin:    General: Skin is warm and dry.     Capillary Refill: Capillary refill takes less than 2 seconds.  Neurological:     General: No focal deficit present.     Mental Status: She is alert and oriented to person, place, and time. Mental status is at baseline.  Psychiatric:        Mood and Affect: Mood normal.        Behavior: Behavior normal.        Thought Content: Thought content normal.        Judgment: Judgment normal.     Results for orders placed or performed in visit on 01/24/23  Iron and TIBC   Collection Time: 01/24/23  1:30 PM  Result Value Ref Range   Iron 38 28 - 170 ug/dL   TIBC 782 956 - 213 ug/dL   Saturation Ratios 11 10.4 - 31.8 %   UIBC 305 ug/dL  Ferritin   Collection Time: 01/24/23  1:30 PM  Result Value Ref Range   Ferritin 24 11 - 307 ng/mL  CBC with Differential/Platelet   Collection Time: 01/24/23  1:30 PM  Result Value Ref Range   WBC 8.3 4.0 - 10.5 K/uL   RBC 4.86 3.87 - 5.11 MIL/uL   Hemoglobin 13.7 12.0 - 15.0 g/dL   HCT 08.6 57.8 - 46.9 %   MCV 87.4 80.0 - 100.0 fL   MCH 28.2 26.0 - 34.0 pg   MCHC 32.2 30.0 - 36.0 g/dL   RDW 62.9 52.8 - 41.3 %   Platelets 149 (L) 150 - 400 K/uL   nRBC 0.0 0.0 - 0.2 %   Neutrophils Relative % 80 %   Neutro Abs 6.6 1.7 - 7.7 K/uL   Lymphocytes Relative 14 %   Lymphs Abs 1.2 0.7 - 4.0 K/uL   Monocytes Relative 4 %   Monocytes Absolute 0.4 0.1 - 1.0 K/uL   Eosinophils  Relative 2 %   Eosinophils Absolute 0.2 0.0 - 0.5 K/uL   Basophils Relative 0 %  Basophils Absolute 0.0 0.0 - 0.1 K/uL   Immature Granulocytes 0 %   Abs Immature Granulocytes 0.02 0.00 - 0.07 K/uL      Assessment & Plan:   Problem List Items Addressed This Visit       Endocrine   Hypothyroidism   Chronic.  Controlled.  Continue with current medication regimen on levothyroxine daily and 1/2 tab on Sunday.  Refills sent today.  Labs ordered today.  Return to clinic in 6 months for reevaluation.  Call sooner if concerns arise.       Relevant Medications   levothyroxine (SYNTHROID) 125 MCG tablet   Other Relevant Orders   Comp Met (CMET)   TSH   T4, free     Other   Hyperlipidemia   Chronic.  Controlled.  Continue with current medication regimen of Pravastatin.  Labs ordered today.  Return to clinic in 6 months for reevaluation.  Call sooner if concerns arise.       Relevant Medications   pravastatin (PRAVACHOL) 20 MG tablet   Other Relevant Orders   Comp Met (CMET)   Lipid Profile   Obesity (BMI 30-39.9)   Recommended eating smaller high protein, low fat meals more frequently and exercising 30 mins a day 5 times a week with a goal of 10-15lb weight loss in the next 3 months.       Relevant Orders   Comp Met (CMET)   Anxiety and depression   Chronic.  Controlled.  Continue with current medication regimen of Celexa 20mg .  Labs ordered today.  Return to clinic in 6 months for reevaluation.  Call sooner if concerns arise.        Relevant Medications   citalopram (CELEXA) 20 MG tablet   Normocytic anemia - Primary   Chronic.  Has not followed up with Hematology since July.  Labs ordered today. Will make recommendations based on results.       Relevant Orders   Comp Met (CMET)   CBC w/Diff   Iron, TIBC and Ferritin Panel   Prediabetes   Labs ordered at visit today.  Will make recommendations based on lab results.        Relevant Orders   Comp Met (CMET)    HgB A1c      Follow up plan: Return in about 6 months (around 12/26/2023) for HTN, HLD, DM2 FU.

## 2023-06-28 LAB — IRON,TIBC AND FERRITIN PANEL
Ferritin: 41 ng/mL (ref 15–150)
Iron Saturation: 45 % (ref 15–55)
Iron: 121 ug/dL (ref 27–139)
Total Iron Binding Capacity: 268 ug/dL (ref 250–450)
UIBC: 147 ug/dL (ref 118–369)

## 2023-06-28 LAB — LIPID PANEL
Chol/HDL Ratio: 3.1 {ratio} (ref 0.0–4.4)
Cholesterol, Total: 199 mg/dL (ref 100–199)
HDL: 65 mg/dL (ref >39)
LDL Chol Calc (NIH): 113 mg/dL — ABNORMAL HIGH (ref 0–99)
Triglycerides: 117 mg/dL (ref 0–149)
VLDL Cholesterol Cal: 21 mg/dL (ref 5–40)

## 2023-06-28 LAB — COMPREHENSIVE METABOLIC PANEL
ALT: 27 [IU]/L (ref 0–32)
AST: 27 [IU]/L (ref 0–40)
Albumin: 4 g/dL (ref 3.8–4.8)
Alkaline Phosphatase: 126 [IU]/L — ABNORMAL HIGH (ref 44–121)
BUN/Creatinine Ratio: 10 — ABNORMAL LOW (ref 12–28)
BUN: 13 mg/dL (ref 8–27)
Bilirubin Total: 0.3 mg/dL (ref 0.0–1.2)
CO2: 25 mmol/L (ref 20–29)
Calcium: 9.4 mg/dL (ref 8.7–10.3)
Chloride: 101 mmol/L (ref 96–106)
Creatinine, Ser: 1.3 mg/dL — ABNORMAL HIGH (ref 0.57–1.00)
Globulin, Total: 2.4 g/dL (ref 1.5–4.5)
Glucose: 92 mg/dL (ref 70–99)
Potassium: 4.8 mmol/L (ref 3.5–5.2)
Sodium: 140 mmol/L (ref 134–144)
Total Protein: 6.4 g/dL (ref 6.0–8.5)
eGFR: 42 mL/min/{1.73_m2} — ABNORMAL LOW (ref >59)

## 2023-06-28 LAB — CBC WITH DIFFERENTIAL/PLATELET
Basophils Absolute: 0 10*3/uL (ref 0.0–0.2)
Basos: 1 %
EOS (ABSOLUTE): 0.2 10*3/uL (ref 0.0–0.4)
Eos: 4 %
Hematocrit: 40.3 % (ref 34.0–46.6)
Hemoglobin: 12.6 g/dL (ref 11.1–15.9)
Immature Grans (Abs): 0 10*3/uL (ref 0.0–0.1)
Immature Granulocytes: 0 %
Lymphocytes Absolute: 1.2 10*3/uL (ref 0.7–3.1)
Lymphs: 21 %
MCH: 27.9 pg (ref 26.6–33.0)
MCHC: 31.3 g/dL — ABNORMAL LOW (ref 31.5–35.7)
MCV: 89 fL (ref 79–97)
Monocytes Absolute: 0.4 10*3/uL (ref 0.1–0.9)
Monocytes: 7 %
Neutrophils Absolute: 4.1 10*3/uL (ref 1.4–7.0)
Neutrophils: 67 %
Platelets: 137 10*3/uL — ABNORMAL LOW (ref 150–450)
RBC: 4.52 x10E6/uL (ref 3.77–5.28)
RDW: 14.1 % (ref 11.7–15.4)
WBC: 6 10*3/uL (ref 3.4–10.8)

## 2023-06-28 LAB — T4, FREE: Free T4: 1.41 ng/dL (ref 0.82–1.77)

## 2023-06-28 LAB — HEMOGLOBIN A1C
Est. average glucose Bld gHb Est-mCnc: 114 mg/dL
Hgb A1c MFr Bld: 5.6 % (ref 4.8–5.6)

## 2023-06-28 LAB — TSH: TSH: 0.234 u[IU]/mL — ABNORMAL LOW (ref 0.450–4.500)

## 2023-07-02 ENCOUNTER — Other Ambulatory Visit: Payer: Self-pay | Admitting: Nurse Practitioner

## 2023-07-02 DIAGNOSIS — E039 Hypothyroidism, unspecified: Secondary | ICD-10-CM

## 2023-07-04 NOTE — Telephone Encounter (Signed)
Changed to Mail order per pt request  Requested Prescriptions  Pending Prescriptions Disp Refills   levothyroxine (SYNTHROID) 125 MCG tablet [Pharmacy Med Name: LEVOTHYROXIN TAB 125MCG] 84 tablet 1    Sig: TAKE 1 TABLET DAILY BEFORE BREAKFAST     Endocrinology:  Hypothyroid Agents Failed - 07/04/2023  2:05 PM      Failed - TSH in normal range and within 360 days    TSH  Date Value Ref Range Status  06/27/2023 0.234 (L) 0.450 - 4.500 uIU/mL Final         Passed - Valid encounter within last 12 months    Recent Outpatient Visits           1 week ago Normocytic anemia   [place] [facility] Larae Grooms, NP   7 months ago Hypothyroidism, unspecified type   [place] [facility] Larae Grooms, NP   1 year ago Hypothyroidism, unspecified type   [place] [facility] Larae Grooms, NP   1 year ago Hypothyroidism, unspecified type   [place] [facility] Larae Grooms, NP   2 years ago Upper respiratory tract infection, unspecified type   [place] [facility] Gerre Scull, NP       Future Appointments             In 5 months Larae Grooms, NP  [facility]

## 2023-07-18 ENCOUNTER — Other Ambulatory Visit: Payer: Self-pay | Admitting: *Deleted

## 2023-07-18 DIAGNOSIS — D509 Iron deficiency anemia, unspecified: Secondary | ICD-10-CM

## 2023-07-19 ENCOUNTER — Inpatient Hospital Stay: Payer: Medicare Other | Attending: Oncology

## 2023-07-19 DIAGNOSIS — D509 Iron deficiency anemia, unspecified: Secondary | ICD-10-CM | POA: Diagnosis not present

## 2023-07-19 DIAGNOSIS — Z85820 Personal history of malignant melanoma of skin: Secondary | ICD-10-CM | POA: Diagnosis not present

## 2023-07-19 LAB — CBC WITH DIFFERENTIAL/PLATELET
Abs Immature Granulocytes: 0.02 10*3/uL (ref 0.00–0.07)
Basophils Absolute: 0 10*3/uL (ref 0.0–0.1)
Basophils Relative: 0 %
Eosinophils Absolute: 0.2 10*3/uL (ref 0.0–0.5)
Eosinophils Relative: 2 %
HCT: 43.3 % (ref 36.0–46.0)
Hemoglobin: 14 g/dL (ref 12.0–15.0)
Immature Granulocytes: 0 %
Lymphocytes Relative: 15 %
Lymphs Abs: 1.2 10*3/uL (ref 0.7–4.0)
MCH: 28.7 pg (ref 26.0–34.0)
MCHC: 32.3 g/dL (ref 30.0–36.0)
MCV: 88.7 fL (ref 80.0–100.0)
Monocytes Absolute: 0.5 10*3/uL (ref 0.1–1.0)
Monocytes Relative: 6 %
Neutro Abs: 6.1 10*3/uL (ref 1.7–7.7)
Neutrophils Relative %: 77 %
Platelets: 153 10*3/uL (ref 150–400)
RBC: 4.88 MIL/uL (ref 3.87–5.11)
RDW: 14.4 % (ref 11.5–15.5)
WBC: 7.9 10*3/uL (ref 4.0–10.5)
nRBC: 0 % (ref 0.0–0.2)

## 2023-07-19 LAB — IRON AND TIBC
Iron: 41 ug/dL (ref 28–170)
Saturation Ratios: 13 % (ref 10.4–31.8)
TIBC: 314 ug/dL (ref 250–450)
UIBC: 273 ug/dL

## 2023-07-19 LAB — FERRITIN: Ferritin: 26 ng/mL (ref 11–307)

## 2023-07-20 ENCOUNTER — Inpatient Hospital Stay: Payer: Medicare Other

## 2023-07-20 ENCOUNTER — Inpatient Hospital Stay (HOSPITAL_BASED_OUTPATIENT_CLINIC_OR_DEPARTMENT_OTHER): Payer: Medicare Other | Admitting: Oncology

## 2023-07-20 ENCOUNTER — Encounter: Payer: Self-pay | Admitting: Oncology

## 2023-07-20 VITALS — BP 120/69 | HR 65 | Temp 97.8°F | Wt 174.2 lb

## 2023-07-20 DIAGNOSIS — D509 Iron deficiency anemia, unspecified: Secondary | ICD-10-CM

## 2023-07-20 DIAGNOSIS — Z85820 Personal history of malignant melanoma of skin: Secondary | ICD-10-CM | POA: Diagnosis not present

## 2023-07-20 NOTE — Progress Notes (Signed)
 [facility]  [phone]  [phone]     Janet Rice DOB: [birth year]  MR#: [id number]  RDW#:[id number]  Patient Care Team: Melvin Pao, NP as PCP - General (Nurse Practitioner) Jacobo Evalene PARAS, MD as Consulting Physician (Hematology and Oncology)   CHIEF COMPLAINT: Iron  deficiency anemia.  INTERVAL HISTORY: Patient returns to clinic today for repeat laboratory work, further evaluation, and consideration of additional IV Feraheme .  Her husband passed away of end-stage congestive heart failure on Christmas Eve.  She otherwise feels well.  She has good family and friend support.  She does not complain of any weakness or fatigue today.  She has no neurologic complaints. She denies any recent fevers or illnesses. She has a good appetite and denies weight loss.  She denies any chest pain, shortness of breath, cough, or hemoptysis.  She denies any nausea, vomiting, constipation, or diarrhea. She has no melena or hematochezia. She has no urinary complaints.  Patient offers no further specific complaints today.  REVIEW OF SYSTEMS:   Review of Systems  Constitutional: Negative.  Negative for fever, malaise/fatigue and weight loss.  Eyes: Negative.   Respiratory: Negative.  Negative for cough and shortness of breath.   Cardiovascular: Negative.  Negative for chest pain and leg swelling.  Gastrointestinal: Negative.  Negative for abdominal pain, blood in stool and melena.  Genitourinary: Negative.  Negative for hematuria.  Musculoskeletal: Negative.  Negative for back pain and joint pain.  Skin: Negative.  Negative for rash.  Neurological: Negative.  Negative for dizziness, sensory change, focal weakness and weakness.  Psychiatric/Behavioral: Negative.  The patient is not nervous/anxious.     As per HPI. Otherwise, a complete review of systems is negative.  ONCOLOGY HISTORY: Oncology History   No history exists.    PAST MEDICAL  HISTORY: Past Medical History:  Diagnosis Date   Adenomatous colon polyp    Anemia    Arthritis    Basal cell carcinoma    left supratip of nose nodular edges involved 08/27/20 Dr. Chrystie, left postauricular sulcus nodular bcc 01/02/20 exc 01/23/20 neg margins   Blood transfusion without reported diagnosis    Cataract    Childhood asthma    AS A CHILD   Chronic heartburn    On omeprazole    Complication of anesthesia    HARD TO WAKE UP   Dyspnea    low hgb. and iron   chronic problem   External hemorrhoids    Frequency of urination    Frequent loose stools    GERD (gastroesophageal reflux disease)    Heme positive stool    History of hiatal hernia    large   Hyperlipidemia    Hypothyroidism    Iron  deficiency anemia    Melanoma (HCC)    left thigh s/p LN removal left groin   Metastatic melanoma (HCC)    Followed by Dr Wilder, previous chemo, no recurrence, 2008?   Multinodular goiter    Osteoporosis    SCC (squamous cell carcinoma)    skin    PAST SURGICAL HISTORY: Past Surgical History:  Procedure Laterality Date   ABDOMINAL HYSTERECTOMY  1984   BACK SURGERY  03/2016   LUMBAR   CARPAL TUNNEL RELEASE Right 09/07/2016   Procedure: CARPAL TUNNEL RELEASE;  Surgeon: Franky Cranker, MD;  Location: [facility] ORS;  Service: Orthopedics;  Laterality: Right;   CHOLECYSTECTOMY  1990   COLONOSCOPY  08/2006   Four sessile polyps found and removed in sigmoid colon at splenic flexure  and ascending colon. 7 mm in size. Another removed from transverse colon 4 mm. Path Report - showed tubular adenoma and hyperplastic polyp. Advised to repeat in 3.5 years (02/2010)   COLONOSCOPY  3.2.2011   8 mm polyp in sigmoid colon, removed, 4 mm polyp in descending colon, removed. Internal hemorrhoids   ESOPHAGOGASTRODUODENOSCOPY  3.2.2011   Large hiatia hernia, esophagus normal, mulitple small sessile polyps w/no stigmata of recent bleeding found. Mildy erythermatous mucosa w/no bleeding found in gatric  antrum. Normal duodenum. 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 PATH - no active inflammation, antral mucosa w/mild foveolar hyperplasia   FRACTURE SURGERY     Lymph Node Removal  12/2005   Left inguinal lymph node dissection   MELANOMA EXCISION Left 1993   Left thigh   ORIF ANKLE FRACTURE Right    Dr. Marchia   TOTAL HIP ARTHROPLASTY Right 11/18/2016   Procedure: TOTAL HIP ARTHROPLASTY;  Surgeon: Marchia Drivers, MD;  Location: [facility] ORS;  Service: Orthopedics;  Laterality: Right;   TOTAL HIP ARTHROPLASTY     left    FAMILY HISTORY Family History  Problem Relation Age of Onset   Aneurysm Mother    Heart disease Mother    Colon polyps Father    Diabetes Father    Dementia Father    Stroke Father        TIA   Macular degeneration Father    Hypothyroidism Daughter    Hypothyroidism Son    Leukemia Maternal Grandfather    Skin cancer Paternal Grandfather    Diabetes Other    Colon polyps Other    Colon cancer Neg Hx    Breast cancer Neg Hx    Stomach cancer Neg Hx    Rectal cancer Neg Hx    Esophageal cancer Neg Hx     GYNECOLOGIC HISTORY:  No LMP recorded. Patient has had a hysterectomy.     ADVANCED DIRECTIVES:    HEALTH MAINTENANCE: Social History   Tobacco Use   Smoking status: Former    Current packs/day: 0.00    Average packs/day: 0.5 packs/day for 8.0 years (4.0 ttl pk-yrs)    Types: Cigarettes    Start date: 07/12/1970    Quit date: 07/12/1978    Years since quitting: 45.0   Smokeless tobacco: Never  Vaping Use   Vaping status: Never Used  Substance Use Topics   Alcohol  use: No    Alcohol /week: 0.0 standard drinks of alcohol    Drug use: No    Allergies  Allergen Reactions   Misc. Sulfonamide Containing Compounds Other (See Comments)   Sulfa Antibiotics Rash    Current Outpatient Medications  Medication Sig Dispense Refill   acetaminophen  (TYLENOL ) 650 MG CR tablet Take 1,300 mg by mouth daily.     B Complex Vitamins (B COMPLEX  PO) Take by mouth.     Calcium  Carbonate-Vitamin D  600-400 MG-UNIT tablet Take by mouth.     citalopram  (CELEXA ) 20 MG tablet Take 1 tablet (20 mg total) by mouth daily. 90 tablet 0   desonide (DESOWEN) 0.05 % cream Apply topically 2 (two) times daily.     Ferrous Sulfate  Dried 45 MG TBCR Take 45 mg by mouth daily with lunch.     levothyroxine  (SYNTHROID ) 125 MCG tablet TAKE 1 TABLET DAILY BEFORE BREAKFAST 84 tablet 1   loperamide  (IMODIUM ) 2 MG capsule Take 2 mg by mouth every morning.     Multiple Vitamins-Minerals (EYE VITAMINS & MINERALS) TABS Take 1 tablet by mouth  daily.     pravastatin  (PRAVACHOL ) 20 MG tablet Take 1 tablet (20 mg total) by mouth daily. 90 tablet 1   No current facility-administered medications for this visit.   Facility-Administered Medications Ordered in Other Visits  Medication Dose Route Frequency Provider Last Rate Last Admin   0.9 %  sodium chloride  infusion   Intravenous PRN Aneth Schlagel J, MD   Stopped at 03/13/21 0940    OBJECTIVE: BP 120/69 (BP Location: Left Arm, Patient Position: Sitting)   Pulse 65   Temp 97.8 F (36.6 C) (Tympanic)   Wt 174 lb 3.2 oz (79 kg)   SpO2 99%   BMI 30.86 kg/m    Body mass index is 30.86 kg/m.    ECOG FS:0 - Asymptomatic  General: Well-developed, well-nourished, no acute distress. Eyes: Pink conjunctiva, anicteric sclera. HEENT: Normocephalic, moist mucous membranes. Lungs: No audible wheezing or coughing. Heart: Regular rate and rhythm. Abdomen: Soft, nontender, no obvious distention. Musculoskeletal: No edema, cyanosis, or clubbing. Neuro: Alert, answering all questions appropriately. Cranial nerves grossly intact. Skin: No rashes or petechiae noted. Psych: Normal affect.  LAB RESULTS:  CBC    Component Value Date/Time   WBC 7.9 07/19/2023 1028   RBC 4.88 07/19/2023 1028   HGB 14.0 07/19/2023 1028   HGB 12.6 06/27/2023 1517   HCT 43.3 07/19/2023 1028   HCT 40.3 06/27/2023 1517   PLT 153 07/19/2023  1028   PLT 137 (L) 06/27/2023 1517   MCV 88.7 07/19/2023 1028   MCV 89 06/27/2023 1517   MCV 74 (L) 12/12/2013 1015   MCH 28.7 07/19/2023 1028   MCHC 32.3 07/19/2023 1028   RDW 14.4 07/19/2023 1028   RDW 14.1 06/27/2023 1517   RDW 33.6 (H) 12/12/2013 1015   LYMPHSABS 1.2 07/19/2023 1028   LYMPHSABS 1.2 06/27/2023 1517   LYMPHSABS 1.2 12/12/2013 1015   MONOABS 0.5 07/19/2023 1028   MONOABS 0.4 12/12/2013 1015   EOSABS 0.2 07/19/2023 1028   EOSABS 0.2 06/27/2023 1517   EOSABS 0.1 12/12/2013 1015   BASOSABS 0.0 07/19/2023 1028   BASOSABS 0.0 06/27/2023 1517   BASOSABS 0.1 12/12/2013 1015   Lab Results  Component Value Date   IRON  41 07/19/2023   TIBC 314 07/19/2023   IRONPCTSAT 13 07/19/2023   Lab Results  Component Value Date   FERRITIN 26 07/19/2023      STUDIES: No results found.  ASSESSMENT & PLAN:    Iron  deficiency anemia: Resolved.  Patient's hemoglobin and iron  stores continue to be within normal limits.  Colonoscopy and EGD on May 02, 2020 did not reveal any distinct pathology.  She does not require additional IV Feraheme  today.  Patient last received treatment on July 22, 2022.  Return to clinic in 4 months with repeat laboratory work, further evaluation, and continuation of treatment if needed.  If patient's laboratory work remains stable at that time, can consider discharging from clinic. History of melanoma: Melanoma of left thigh status post resection in 1993, exact stage is not known, metastatic to inguinal lymph node. No evidence of recurrent disease.  I spent a total of 20 minutes reviewing chart data, face-to-face evaluation with the patient, counseling and coordination of care as detailed above.   Patient expressed understanding and was in agreement with this plan. She also understands that She can call clinic at any time with any questions, concerns, or complaints.    Evalene JINNY Reusing, MD   07/20/2023 11:11 AM

## 2023-08-03 ENCOUNTER — Ambulatory Visit (INDEPENDENT_AMBULATORY_CARE_PROVIDER_SITE_OTHER): Payer: Medicare Other | Admitting: Pediatrics

## 2023-08-03 VITALS — BP 129/68 | HR 80 | Temp 102.3°F | Resp 14 | Wt 174.2 lb

## 2023-08-03 DIAGNOSIS — J101 Influenza due to other identified influenza virus with other respiratory manifestations: Secondary | ICD-10-CM | POA: Diagnosis not present

## 2023-08-03 DIAGNOSIS — R7989 Other specified abnormal findings of blood chemistry: Secondary | ICD-10-CM | POA: Diagnosis not present

## 2023-08-03 DIAGNOSIS — J029 Acute pharyngitis, unspecified: Secondary | ICD-10-CM | POA: Diagnosis not present

## 2023-08-03 DIAGNOSIS — Z1152 Encounter for screening for COVID-19: Secondary | ICD-10-CM | POA: Diagnosis not present

## 2023-08-03 DIAGNOSIS — R011 Cardiac murmur, unspecified: Secondary | ICD-10-CM | POA: Diagnosis not present

## 2023-08-03 MED ORDER — ONDANSETRON HCL 4 MG PO TABS: 4.0000 mg | ORAL_TABLET | Freq: Three times a day (TID) | ORAL | 0 refills | Status: DC | PRN

## 2023-08-03 MED ORDER — OSELTAMIVIR PHOSPHATE 75 MG PO CAPS: 75.0000 mg | ORAL_CAPSULE | Freq: Two times a day (BID) | ORAL | 0 refills | Status: AC

## 2023-08-03 NOTE — Patient Instructions (Addendum)
Take tamiflu twice daily for 5 days. If not better by next week please return. I sent zofran which is an anti nausea medication  For back pain:  Use tylenol and over the counter diclofenc gel (voltaren gel)  Most cold symptoms last up to 2 weeks, but cough can sometimes linger up to 4 weeks.  However if your symtpoms get WORSE - like you develop fevers or get more shortness of breath, then call your clinic as you may need to be evaluated.   Aches and Pains Acetaminophen (Tylenol): 1000mg  ("extra strength" tablets are 500mg , so take 2) every 8 hours if needed   Sore Throat:  See Aches and Pains meds above, also Sore throat sprays and lozenges may also help.   Cough:  Honey 2 TBS every 4-6 hours if needed.  Robitussin DM syrup or generic equivalent which has (guaifenesin = an expectorant to help you get stuff up + dextromethorphan (DM) = cough supressant). You can also get this in tablet formula (like Mucinex DM or generic equivalent).  If you have asthma or are wheezing and have a tight chest, then albuterol inhaler (Ventolin, ProAir) may be helpful - you need a prescription for this.   Congestion:  oxymetazoline (Afrin) nasal stray: 2 sprays each nostril every 12 hours. Don't use more than 3 days in a row to avoid building a tolerance to it.  Sinus rinse (neti pot) high volume sinus rinse can help open up your sinuses and be helpful, especially if you're having sinus pressure and headaches.   Other:  Umcka (pelargonium sidoides extract) can to shorten cold symptoms (can be hard to find, but [employer] carries it: brand name Umcka ColdCare from [employer]). Works best if you start taking at earliest signs of cold symptoms.  Andrographis paniculata is another herbal remedy with less evidence, but may reduce common cold symptoms in adults.  zinc acetate lozenges >= 80 mg/day reduces duration but not severity of cold symptoms in adults, but it is associated with bad taste and  nausea Heated humidified air may reduce cold symptoms, so try using a humidifier - especially in your bedroom at night.  Stay hydrated! Aim to drink at least 2 liters of water daily.   What doesn't work (but lots of folks think might) Vitamin C: bummer right?! But there's no evidence that high dose vitamin C will help cold symptoms.

## 2023-08-03 NOTE — Progress Notes (Signed)
Office Visit  BP 129/68 (BP Location: Left Arm, Cuff Size: Normal)   Pulse 80   Temp (!) 102.3 F (39.1 C) (Oral)   Resp 14   Wt 174 lb 3.2 oz (79 kg)   SpO2 94%   BMI 30.86 kg/m    Subjective:    Patient ID: Janet Rice, female    DOB: Jul 23, 1945, 78 y.o.   MRN: [id number]  HPI: Janet Rice is a 78 y.o. female  Chief Complaint  Patient presents with   URI    Started on Monday night. Coughing, sore throat, runny nose, congestion, headaches, body aches, sinus pressure, not sure about fevers.     Discussed the use of AI scribe software for clinical note transcription with the patient, who gave verbal consent to proceed.  History of Present Illness   The patient presented with a chief complaint of a sore throat, which was the initial symptom of her current illness. This was followed by the onset of congestion and sinus pressure. The patient reported a high fever, but had not taken any Tylenol on the day of the consultation. She also reported nausea and had vomited earlier in the day. The patient's cough was not continuous.  The patient's grandson, who she had seen recently, had been ill with the flu the week before.  The patient also reported a sore spot on her rib, which she believed was improving and may have been due to a bump or similar minor injury. She had a known heart murmur, which was not a new finding.  The patient's kidney function was slightly elevated, which she believed started when she was on Suprax. She was not currently taking Celebrex, which she had stopped several years ago.     Relevant past medical, surgical, family and social history reviewed and updated as indicated. Interim medical history since our last visit reviewed. Allergies and medications reviewed and updated.  ROS per HPI unless specifically indicated above     Objective:    BP 129/68 (BP Location: Left Arm, Cuff Size: Normal)   Pulse 80   Temp (!) 102.3 F (39.1 C) (Oral)    Resp 14   Wt 174 lb 3.2 oz (79 kg)   SpO2 94%   BMI 30.86 kg/m   Wt Readings from Last 3 Encounters:  08/03/23 174 lb 3.2 oz (79 kg)  07/20/23 174 lb 3.2 oz (79 kg)  06/27/23 179 lb 3.2 oz (81.3 kg)     Physical Exam Constitutional:      Appearance: Normal appearance.  HENT:     Head: Normocephalic and atraumatic.  Eyes:     Pupils: Pupils are equal, round, and reactive to light.  Cardiovascular:     Rate and Rhythm: Normal rate and regular rhythm.     Pulses: Normal pulses.     Heart sounds: Murmur heard.  Pulmonary:     Effort: Pulmonary effort is normal.     Breath sounds: Normal breath sounds.  Abdominal:     General: Abdomen is flat.     Palpations: Abdomen is soft.  Musculoskeletal:        General: Normal range of motion.     Cervical back: Normal range of motion.  Skin:    General: Skin is warm and dry.     Capillary Refill: Capillary refill takes less than 2 seconds.  Neurological:     General: No focal deficit present.     Mental Status: She is alert. Mental status  is at baseline.  Psychiatric:        Mood and Affect: Mood normal.        Behavior: Behavior normal.         08/03/2023    1:24 PM 11/24/2022    2:26 PM 09/27/2022   11:07 AM 05/24/2022    3:46 PM 11/18/2021    1:08 PM  Depression screen PHQ 2/9  Decreased Interest 0 0 0 0 0  Down, Depressed, Hopeless 0 0 0 0 0  PHQ - 2 Score 0 0 0 0 0  Altered sleeping 0 0 0 0 1  Tired, decreased energy 0 0 0 1 1  Change in appetite 0 0 0 0 0  Feeling bad or failure about yourself  0 0 0 0 0  Trouble concentrating 0 0 0 0 0  Moving slowly or fidgety/restless 0 0 0 0 0  Suicidal thoughts 0 0 0 0 0  PHQ-9 Score 0 0 0 1 2  Difficult doing work/chores Not difficult at all  Not difficult at all Not difficult at all Not difficult at all       08/03/2023    1:25 PM 11/24/2022    2:27 PM 05/24/2022    3:46 PM 11/18/2021    1:09 PM  GAD 7 : Generalized Anxiety Score  Nervous, Anxious, on Edge 0 0 0 0   Control/stop worrying 0 0 0 0  Worry too much - different things 1 0 0 0  Trouble relaxing 0 0 0 0  Restless 0 0 0 0  Easily annoyed or irritable 0 0 0 0  Afraid - awful might happen 0 0 0 0  Total GAD 7 Score 1 0 0 0  Anxiety Difficulty Not difficult at all  Not difficult at all Not difficult at all       Assessment & Plan:  Assessment & Plan   Influenza A Positive flu test with symptoms of sore throat, congestion, sinus pressure, and fever. Nausea and vomiting also reported.  -Prescribe Tamiflu, twice a day for five days. -Advise over-the-counter remedies such as Robitussin DM and Mucinex DM for cough. -Prescribe Zofran for nausea. -Advise patient to stay hydrated. -Return for follow-up if not improved by next week. -     Rapid Strep Screen ([facility] ONLY) -     Novel Coronavirus, NAA ([facility]) -     Veritor Flu A/B Waived -     Oseltamivir Phosphate; Take 1 capsule (75 mg total) by mouth 2 (two) times daily for 5 days.  Dispense: 10 capsule; Refill: 0 -     Ondansetron HCl; Take 1 tablet (4 mg total) by mouth every 8 (eight) hours as needed for nausea or vomiting.  Dispense: 20 tablet; Refill: 0  Cardiac murmur Diastolic murmur noted on exam. Per patient known. She had echo in 2019, mild diastolic dysfunction, mild aortic regurgitation. Encouraged to get repeat echo and cardiology follow up. FYI to PCP sent.  Increase in serum creatinine from prior measurement Noted on previous blood work. Checking to confirm tamiflu dosing ok. -     Basic metabolic panel  Follow up plan: Return if symptoms worsen or fail to improve.  Ervey Fallin Howell Pringle, MD  Approximately 30 minutes spent on patient encounter today including assessment, counseling, diagnosing, treatment plan development, and charting.

## 2023-08-04 LAB — BASIC METABOLIC PANEL
BUN/Creatinine Ratio: 9 — ABNORMAL LOW (ref 12–28)
BUN: 10 mg/dL (ref 8–27)
CO2: 25 mmol/L (ref 20–29)
Calcium: 9.4 mg/dL (ref 8.7–10.3)
Chloride: 97 mmol/L (ref 96–106)
Creatinine, Ser: 1.07 mg/dL — ABNORMAL HIGH (ref 0.57–1.00)
Glucose: 96 mg/dL (ref 70–99)
Potassium: 4.8 mmol/L (ref 3.5–5.2)
Sodium: 137 mmol/L (ref 134–144)
eGFR: 53 mL/min/{1.73_m2} — ABNORMAL LOW (ref >59)

## 2023-08-05 LAB — VERITOR FLU A/B WAIVED
Influenza A: POSITIVE — AB
Influenza B: NEGATIVE

## 2023-08-05 LAB — CULTURE, GROUP A STREP: Strep A Culture: NEGATIVE

## 2023-08-05 LAB — NOVEL CORONAVIRUS, NAA: SARS-CoV-2, NAA: NOT DETECTED

## 2023-08-05 LAB — RAPID STREP SCREEN (MED CTR MEBANE ONLY): Strep Gp A Ag, IA W/Reflex: NEGATIVE

## 2023-08-20 ENCOUNTER — Other Ambulatory Visit: Payer: Self-pay | Admitting: Nurse Practitioner

## 2023-08-20 DIAGNOSIS — F32A Depression, unspecified: Secondary | ICD-10-CM

## 2023-08-22 NOTE — Telephone Encounter (Signed)
 Requested Prescriptions  Pending Prescriptions Disp Refills   citalopram  (CELEXA ) 20 MG tablet [Pharmacy Med Name: CITALOPRAM  TAB 20MG ] 90 tablet 0    Sig: TAKE 1 TABLET DAILY     Psychiatry:  Antidepressants - SSRI Passed - 08/22/2023 11:21 AM      Passed - Completed PHQ-2 or PHQ-9 in the last 360 days      Passed - Valid encounter within last 6 months    Recent Outpatient Visits           2 weeks ago Influenza A   [place] [facility] Hadassah Letters, MD   1 month ago Normocytic anemia   [place] [facility] Aileen Alexanders, NP   9 months ago Hypothyroidism, unspecified type   [place] [facility] Aileen Alexanders, NP   1 year ago Hypothyroidism, unspecified type   [place] [facility] Aileen Alexanders, NP   1 year ago Hypothyroidism, unspecified type   [place] [facility] Aileen Alexanders, NP       Future Appointments             In 4 months Aileen Alexanders, NP [employer] [facility]

## 2023-10-03 ENCOUNTER — Ambulatory Visit: Payer: Medicare Other

## 2023-10-03 DIAGNOSIS — Z0000 Encounter for general adult medical examination without abnormal findings: Secondary | ICD-10-CM

## 2023-10-03 NOTE — Progress Notes (Signed)
 Subjective:   Janet Rice is a 78 y.o. who presents for a Medicare Wellness preventive visit.  Visit Complete: Virtual I connected with  Janet Rice on 10/03/23 by a audio enabled telemedicine application and verified that I am speaking with the correct person using two identifiers.  Patient Location: Home  Provider Location: Office/Clinic  I discussed the limitations of evaluation and management by telemedicine. The patient expressed understanding and agreed to proceed.  Vital Signs: Because this visit was a virtual/telehealth visit, some criteria may be missing or patient reported. Any vitals not documented were not able to be obtained and vitals that have been documented are patient reported.  VideoError- Librarian, academic were attempted between this provider and patient, however failed, due to patient having technical difficulties OR patient did not have access to video capability.  We continued and completed visit with audio only.   Persons Participating in Visit: Patient.  AWV Questionnaire: Yes: Patient Medicare AWV questionnaire was completed by the patient on 10/03/2023; I have confirmed that all information answered by patient is correct and no changes since this date.  Cardiac Risk Factors include: advanced age (>89men, >54 women);dyslipidemia     Objective:    Today's Vitals   10/03/23 1047  PainSc: 1    There is no height or weight on file to calculate BMI.     10/03/2023   10:54 AM 07/20/2023   10:51 AM 01/25/2023    2:32 PM 10/21/2022    2:03 PM 09/27/2022   11:09 AM 07/22/2022    2:34 PM 10/15/2021    1:32 PM  Advanced Directives  Does Patient Have a Medical Advance Directive? Yes Yes Yes Yes No Yes Yes  Type of Estate agent of [place];Living will Healthcare Power of [place];Living will Healthcare Power of [place];Living will Living will;Healthcare Power of [employer] Power of  [place];Living will Healthcare Power of [place];Living will  Copy of Healthcare Power of Attorney in Chart? No - copy requested  No - copy requested No - copy requested     Would patient like information on creating a medical advance directive?     No - Patient declined      Current Medications (verified) Outpatient Encounter Medications as of 10/03/2023  Medication Sig   acetaminophen (TYLENOL) 650 MG CR tablet Take 1,300 mg by mouth daily.   B Complex Vitamins (B COMPLEX PO) Take by mouth.   Calcium Carbonate-Vitamin D 600-400 MG-UNIT tablet Take by mouth.   citalopram (CELEXA) 20 MG tablet TAKE 1 TABLET DAILY   desonide (DESOWEN) 0.05 % cream Apply topically 2 (two) times daily.   Ferrous Sulfate Dried 45 MG TBCR Take 45 mg by mouth daily with lunch.   levothyroxine (SYNTHROID) 125 MCG tablet TAKE 1 TABLET DAILY BEFORE BREAKFAST   loperamide (IMODIUM) 2 MG capsule Take 2 mg by mouth every morning.   Multiple Vitamins-Minerals (EYE VITAMINS & MINERALS) TABS Take 1 tablet by mouth daily.   pravastatin (PRAVACHOL) 20 MG tablet Take 1 tablet (20 mg total) by mouth daily.   ondansetron (ZOFRAN) 4 MG tablet Take 1 tablet (4 mg total) by mouth every 8 (eight) hours as needed for nausea or vomiting. (Patient not taking: Reported on 10/03/2023)   Facility-Administered Encounter Medications as of 10/03/2023  Medication   0.9 %  sodium chloride infusion    Allergies (verified) Misc. sulfonamide containing compounds and Sulfa antibiotics   History: Past Medical History:  Diagnosis Date  Adenomatous colon polyp    Anemia    Arthritis    Basal cell carcinoma    left supratip of nose nodular edges involved 08/27/20 Dr. Roseanne Kaufman, left postauricular sulcus nodular bcc 01/02/20 exc 01/23/20 neg margins   Blood transfusion without reported diagnosis    Cataract    Childhood asthma    AS A CHILD   Chronic heartburn    On omeprazole   Complication of anesthesia    HARD TO WAKE UP   Dyspnea     low hgb. and iron  chronic problem   External hemorrhoids    Frequency of urination    Frequent loose stools    GERD (gastroesophageal reflux disease)    Heme positive stool    History of hiatal hernia    large   Hyperlipidemia    Hypothyroidism    Iron deficiency anemia    Melanoma (HCC)    left thigh s/p LN removal left groin   Metastatic melanoma (HCC)    Followed by Dr Doylene Canning, previous chemo, no recurrence, 2008?   Multinodular goiter    Osteoporosis    SCC (squamous cell carcinoma)    skin   Past Surgical History:  Procedure Laterality Date   ABDOMINAL HYSTERECTOMY  1984   BACK SURGERY  03/2016   LUMBAR   CARPAL TUNNEL RELEASE Right 09/07/2016   Procedure: CARPAL TUNNEL RELEASE;  Surgeon: Juanell Fairly, MD;  Location: [facility] ORS;  Service: Orthopedics;  Laterality: Right;   CHOLECYSTECTOMY  1990   COLONOSCOPY  08/2006   Four sessile polyps found and removed in sigmoid colon at splenic flexure and ascending colon. 7 mm in size. Another removed from transverse colon 4 mm. Path Report - showed tubular adenoma and hyperplastic polyp. Advised to repeat in 3.5 years (02/2010)   COLONOSCOPY  3.2.2011   8 mm polyp in sigmoid colon, removed, 4 mm polyp in descending colon, removed. Internal hemorrhoids   ESOPHAGOGASTRODUODENOSCOPY  3.2.2011   Large hiatia hernia, esophagus normal, mulitple small sessile polyps w/no stigmata of recent bleeding found. Mildy erythermatous mucosa w/no bleeding found in gatric antrum. Normal duodenum. 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 PATH - no active inflammation, antral mucosa w/mild foveolar hyperplasia   FRACTURE SURGERY     Lymph Node Removal  12/2005   Left inguinal lymph node dissection   MELANOMA EXCISION Left 1993   Left thigh   ORIF ANKLE FRACTURE Right    Dr. Martha Clan   TOTAL HIP ARTHROPLASTY Right 11/18/2016   Procedure: TOTAL HIP ARTHROPLASTY;  Surgeon: Juanell Fairly, MD;  Location: [facility] ORS;  Service: Orthopedics;   Laterality: Right;   TOTAL HIP ARTHROPLASTY     left   Family History  Problem Relation Age of Onset   Aneurysm Mother    Heart disease Mother    Colon polyps Father    Diabetes Father    Dementia Father    Stroke Father        TIA   Macular degeneration Father    Hypothyroidism Daughter    Hypothyroidism Son    Leukemia Maternal Grandfather    Skin cancer Paternal Grandfather    Diabetes Other    Colon polyps Other    Colon cancer Neg Hx    Breast cancer Neg Hx    Stomach cancer Neg Hx    Rectal cancer Neg Hx    Esophageal cancer Neg Hx    Social History   Socioeconomic History   Marital status: Widowed  Spouse name: Not on file   Number of children: 2   Years of education: Not on file   Highest education level: Associate degree: occupational, Scientist, product/process development, or vocational program  Occupational History   Occupation: HR Consultant    Employer: [employer]  Tobacco Use   Smoking status: Former    Current packs/day: 0.00    Average packs/day: 0.5 packs/day for 8.0 years (4.0 ttl pk-yrs)    Types: Cigarettes    Start date: 07/12/1970    Quit date: 07/12/1978    Years since quitting: 45.2   Smokeless tobacco: Never  Vaping Use   Vaping status: Never Used  Substance and Sexual Activity   Alcohol use: No    Alcohol/week: 0.0 standard drinks of alcohol   Drug use: No   Sexual activity: Not Currently  Other Topics Concern   Not on file  Social History Narrative   Part time [employer] as of 03/10/20 retired 08/29/20    Married    1 daughter    1 son    Widowed christmas eve 2024   Social Drivers of Corporate investment banker Strain: Low Risk  (10/03/2023)   Overall Financial Resource Strain (CARDIA)    Difficulty of Paying Living Expenses: Not hard at all  Food Insecurity: No Food Insecurity (10/03/2023)   Hunger Vital Sign    Worried About Running Out of Food in the Last Year: Never true    Ran Out of Food in the Last Year: Never true  Transportation Needs: No  Transportation Needs (10/03/2023)   PRAPARE - Administrator, Civil Service (Medical): No    Lack of Transportation (Non-Medical): No  Physical Activity: Insufficiently Active (10/03/2023)   Exercise Vital Sign    Days of Exercise per Week: 4 days    Minutes of Exercise per Session: 30 min  Stress: No Stress Concern Present (10/03/2023)   [employer] of Occupational Health - Occupational Stress Questionnaire    Feeling of Stress : Not at all  Social Connections: Moderately Isolated (10/03/2023)   Social Connection and Isolation Panel [NHANES]    Frequency of Communication with Friends and Family: More than three times a week    Frequency of Social Gatherings with Friends and Family: Once a week    Attends Religious Services: Never    Database administrator or Organizations: Yes    Attends Engineer, structural: More than 4 times per year    Marital Status: Widowed    Tobacco Counseling Counseling given: Not Answered    Clinical Intake:  Pre-visit preparation completed: Yes  Pain : 0-10 Pain Score: 1  Pain Type: Chronic pain Pain Location: Generalized Pain Descriptors / Indicators: Aching Pain Onset: More than a month ago Pain Frequency: Constant     Nutritional Risks: None Diabetes: No  Lab Results  Component Value Date   HGBA1C 5.6 06/27/2023   HGBA1C 5.7 (H) 11/24/2022   HGBA1C 5.3 05/24/2022     How often do you need to have someone help you when you read instructions, pamphlets, or other written materials from your doctor or pharmacy?: 1 - Never  Interpreter Needed?: No  Information entered by :: NAllen LPN   Activities of Daily Living     10/03/2023   10:50 AM 10/03/2023   10:48 AM  In your present state of health, do you have any difficulty performing the following activities:  Hearing? 0 0  Vision? 0 0  Difficulty concentrating or making decisions? 0  0  Walking or climbing stairs? 0 0  Dressing or bathing? 0 0  Doing  errands, shopping? 0 0  Preparing Food and eating ? N N  Using the Toilet? N N  In the past six months, have you accidently leaked urine? N N  Do you have problems with loss of bowel control? N N  Managing your Medications? N N  Managing your Finances? N N  Housekeeping or managing your Housekeeping? N N    Patient Care Team: Larae Grooms, NP as PCP - General (Nurse Practitioner) Jeralyn Ruths, MD as Consulting Physician (Hematology and Oncology)  Indicate any recent Medical Services you may have received from other than [facility] providers in the past year (date may be approximate).     Assessment:   This is a routine wellness examination for [place].  Hearing/Vision screen Hearing Screening - Comments:: Denies hearing issues Vision Screening - Comments:: Regular eye exams, [facility]   Goals Addressed             This Visit's Progress    Patient Stated       10/03/2023, wants to increase walking and be more active       Depression Screen     10/03/2023   10:55 AM 08/03/2023    1:24 PM 11/24/2022    2:26 PM 09/27/2022   11:07 AM 05/24/2022    3:46 PM 11/18/2021    1:08 PM 09/09/2021   10:04 AM  PHQ 2/9 Scores  PHQ - 2 Score 0 0 0 0 0 0 0  PHQ- 9 Score 0 0 0 0 1 2     Fall Risk     10/03/2023   10:50 AM 08/03/2023    1:24 PM 11/24/2022    2:26 PM 09/27/2022   11:10 AM 05/24/2022    3:46 PM  Fall Risk   Falls in the past year? 0 0 0 0 1  Number falls in past yr: 0 0 0 0 0  Injury with Fall? 0 0 0 0 0  Risk for fall due to : Medication side effect No Fall Risks No Fall Risks No Fall Risks History of fall(s)  Follow up Falls prevention discussed;Falls evaluation completed Falls prevention discussed;Education provided;Falls evaluation completed Falls evaluation completed Falls prevention discussed;Falls evaluation completed Falls evaluation completed    MEDICARE RISK AT HOME:  Medicare Risk at Home Any stairs in or around the home?: (Patient-Rptd)  No If so, are there any without handrails?: (Patient-Rptd) Yes Home free of loose throw rugs in walkways, pet beds, electrical cords, etc?: (Patient-Rptd) Yes Adequate lighting in your home to reduce risk of falls?: (Patient-Rptd) Yes Life alert?: (Patient-Rptd) No Use of a cane, walker or w/c?: (Patient-Rptd) No Grab bars in the bathroom?: (Patient-Rptd) Yes Shower chair or bench in shower?: (Patient-Rptd) Yes Elevated toilet seat or a handicapped toilet?: (Patient-Rptd) Yes  TIMED UP AND GO:  Was the test performed?  No  Cognitive Function: 6CIT completed    12/30/2017    1:36 PM 10/15/2015   10:16 AM  MMSE - Mini Mental State Exam  Orientation to time 5 5  Orientation to Place 5 5  Registration 3 3  Attention/ Calculation 5 5  Recall 3 3  Language- name 2 objects 2 2  Language- repeat 1 1  Language- follow 3 step command 3 3  Language- read & follow direction 1 1  Write a sentence 1 1  Copy design 1 1  Total score 30  30        10/03/2023   10:56 AM 09/27/2022   11:14 AM 09/09/2021    9:58 AM  6CIT Screen  What Year? 0 points 0 points 0 points  What month? 0 points 0 points 0 points  What time? 0 points 0 points 0 points  Count back from 20 0 points 0 points 0 points  Months in reverse 0 points 0 points 0 points  Repeat phrase 0 points 0 points 0 points  Total Score 0 points 0 points 0 points    Immunizations Immunization History  Administered Date(s) Administered   Fluad Quad(high Dose 65+) 04/26/2019, 04/29/2020, 05/24/2022   Influenza Split 08/17/2011, 05/12/2014   Influenza, High Dose Seasonal PF 08/31/2016, 07/22/2017, 06/13/2018, 04/23/2021   Influenza-Unspecified 05/16/2013, 04/11/2014, 04/27/2019   PFIZER Comirnaty(Gray Top)Covid-19 Tri-Sucrose Vaccine 10/22/2020   PFIZER(Purple Top)SARS-COV-2 Vaccination 08/03/2019, 08/25/2019, 04/17/2020   Pfizer Covid-19 Vaccine Bivalent Booster 59yrs & up 04/23/2021   Pneumococcal Conjugate-13 01/16/2014    Pneumococcal Polysaccharide-23 03/24/2012, 08/22/2018   Tdap 03/24/2012   Zoster Recombinant(Shingrix) 01/28/2022   Zoster, Live 08/18/2012    Screening Tests Health Maintenance  Topic Date Due   DTaP/Tdap/Td (2 - Td or Tdap) 03/24/2022   Zoster Vaccines- Shingrix (2 of 2) 03/25/2022   Colonoscopy  05/03/2023   COVID-19 Vaccine (8 - Pfizer risk 2024-25 season) 10/17/2023   Medicare Annual Wellness (AWV)  10/02/2024   Pneumonia Vaccine 61+ Years old  Completed   INFLUENZA VACCINE  Completed   DEXA SCAN  Completed   Hepatitis C Screening  Completed   HPV VACCINES  Aged Out    Health Maintenance  Health Maintenance Due  Topic Date Due   DTaP/Tdap/Td (2 - Td or Tdap) 03/24/2022   Zoster Vaccines- Shingrix (2 of 2) 03/25/2022   Colonoscopy  05/03/2023   Health Maintenance Items Addressed: DUE for TDAP and due for second Shingrix. Colonoscopy no longer needed.  Additional Screening:  Vision Screening: Recommended annual ophthalmology exams for early detection of glaucoma and other disorders of the eye.  Dental Screening: Recommended annual dental exams for proper oral hygiene  Community Resource Referral / Chronic Care Management: CRR required this visit?  No   CCM required this visit?  No     Plan:     I have personally reviewed and noted the following in the patient's chart:   Medical and social history Use of alcohol, tobacco or illicit drugs  Current medications and supplements including opioid prescriptions. Patient is not currently taking opioid prescriptions. Functional ability and status Nutritional status Physical activity Advanced directives List of other physicians Hospitalizations, surgeries, and ER visits in previous 12 months Vitals Screenings to include cognitive, depression, and falls Referrals and appointments  In addition, I have reviewed and discussed with patient certain preventive protocols, quality metrics, and best practice  recommendations. A written personalized care plan for preventive services as well as general preventive health recommendations were provided to patient.     Barb Merino, LPN   1/61/0960   After Visit Summary: (MyChart) Due to this being a telephonic visit, the after visit summary with patients personalized plan was offered to patient via MyChart   Notes: Nothing significant to report at this time.

## 2023-10-03 NOTE — Patient Instructions (Signed)
 Ms. Baham , Thank you for taking time to come for your Medicare Wellness Visit. I appreciate your ongoing commitment to your health goals. Please review the following plan we discussed and let me know if I can assist you in the future.   Referrals/Orders/Follow-Ups/Clinician Recommendations: none  This is a list of the screening recommended for you and due dates:  Health Maintenance  Topic Date Due   DTaP/Tdap/Td vaccine (2 - Td or Tdap) 03/24/2022   Zoster (Shingles) Vaccine (2 of 2) 03/25/2022   Colon Cancer Screening  05/03/2023   COVID-19 Vaccine (8 - Pfizer risk 2024-25 season) 10/17/2023   Medicare Annual Wellness Visit  10/02/2024   Pneumonia Vaccine  Completed   Flu Shot  Completed   DEXA scan (bone density measurement)  Completed   Hepatitis C Screening  Completed   HPV Vaccine  Aged Out    Advanced directives: (Copy Requested) Please bring a copy of your health care power of attorney and living will to the office to be added to your chart at your convenience. You can mail to [facility] [address] [place], [place] [address] or email to [email or web address]  Next Medicare Annual Wellness Visit scheduled for next year: Yes  insert Preventive Care attachment Insert FALL PREVENTION attachment if needed

## 2023-10-07 DIAGNOSIS — M4712 Other spondylosis with myelopathy, cervical region: Secondary | ICD-10-CM | POA: Diagnosis not present

## 2023-11-16 ENCOUNTER — Other Ambulatory Visit: Payer: Self-pay | Admitting: Nurse Practitioner

## 2023-11-16 ENCOUNTER — Inpatient Hospital Stay: Payer: Medicare Other | Attending: Oncology

## 2023-11-16 DIAGNOSIS — Z85820 Personal history of malignant melanoma of skin: Secondary | ICD-10-CM | POA: Diagnosis not present

## 2023-11-16 DIAGNOSIS — D509 Iron deficiency anemia, unspecified: Secondary | ICD-10-CM | POA: Diagnosis not present

## 2023-11-16 DIAGNOSIS — F419 Anxiety disorder, unspecified: Secondary | ICD-10-CM

## 2023-11-16 DIAGNOSIS — F32A Depression, unspecified: Secondary | ICD-10-CM

## 2023-11-16 DIAGNOSIS — Z87891 Personal history of nicotine dependence: Secondary | ICD-10-CM | POA: Insufficient documentation

## 2023-11-16 LAB — CBC WITH DIFFERENTIAL/PLATELET
Abs Immature Granulocytes: 0.02 10*3/uL (ref 0.00–0.07)
Basophils Absolute: 0 10*3/uL (ref 0.0–0.1)
Basophils Relative: 1 %
Eosinophils Absolute: 0.2 10*3/uL (ref 0.0–0.5)
Eosinophils Relative: 3 %
HCT: 42.4 % (ref 36.0–46.0)
Hemoglobin: 13.8 g/dL (ref 12.0–15.0)
Immature Granulocytes: 0 %
Lymphocytes Relative: 18 %
Lymphs Abs: 1 10*3/uL (ref 0.7–4.0)
MCH: 28.6 pg (ref 26.0–34.0)
MCHC: 32.5 g/dL (ref 30.0–36.0)
MCV: 88 fL (ref 80.0–100.0)
Monocytes Absolute: 0.4 10*3/uL (ref 0.1–1.0)
Monocytes Relative: 6 %
Neutro Abs: 4.3 10*3/uL (ref 1.7–7.7)
Neutrophils Relative %: 72 %
Platelets: 126 10*3/uL — ABNORMAL LOW (ref 150–400)
RBC: 4.82 MIL/uL (ref 3.87–5.11)
RDW: 14.4 % (ref 11.5–15.5)
WBC: 5.9 10*3/uL (ref 4.0–10.5)
nRBC: 0 % (ref 0.0–0.2)

## 2023-11-16 LAB — IRON AND TIBC
Iron: 53 ug/dL (ref 28–170)
Saturation Ratios: 18 % (ref 10.4–31.8)
TIBC: 300 ug/dL (ref 250–450)
UIBC: 247 ug/dL

## 2023-11-16 LAB — FERRITIN: Ferritin: 29 ng/mL (ref 11–307)

## 2023-11-17 ENCOUNTER — Encounter: Payer: Self-pay | Admitting: Oncology

## 2023-11-17 ENCOUNTER — Inpatient Hospital Stay: Payer: Medicare Other

## 2023-11-17 ENCOUNTER — Inpatient Hospital Stay (HOSPITAL_BASED_OUTPATIENT_CLINIC_OR_DEPARTMENT_OTHER): Payer: Medicare Other | Admitting: Oncology

## 2023-11-17 VITALS — BP 115/66 | HR 70 | Temp 98.2°F | Resp 16 | Ht 63.0 in | Wt 169.2 lb

## 2023-11-17 DIAGNOSIS — Z87891 Personal history of nicotine dependence: Secondary | ICD-10-CM | POA: Diagnosis not present

## 2023-11-17 DIAGNOSIS — Z85820 Personal history of malignant melanoma of skin: Secondary | ICD-10-CM | POA: Diagnosis not present

## 2023-11-17 DIAGNOSIS — D509 Iron deficiency anemia, unspecified: Secondary | ICD-10-CM | POA: Diagnosis not present

## 2023-11-17 NOTE — Progress Notes (Signed)
 [facility]  [phone]  [phone]     Janet Rice DOB: [birth year]  MR#: [id number]  QIO#:[id number]  Patient Care Team: Aileen Alexanders, NP as PCP - General (Nurse Practitioner) Shellie Dials, MD as Consulting Physician (Hematology and Oncology)   CHIEF COMPLAINT: Iron  deficiency anemia.  INTERVAL HISTORY: Patient returns to clinic today for repeat laboratory, further evaluation, and consideration of additional IV Feraheme .  She currently feels well and is asymptomatic.  She does not complain of any weakness or fatigue.  She has no neurologic complaints. She denies any recent fevers or illnesses. She has a good appetite and denies weight loss.  She denies any chest pain, shortness of breath, cough, or hemoptysis.  She denies any nausea, vomiting, constipation, or diarrhea. She has no melena or hematochezia. She has no urinary complaints.  Patient offers no specific complaints today.  REVIEW OF SYSTEMS:   Review of Systems  Constitutional: Negative.  Negative for fever, malaise/fatigue and weight loss.  Eyes: Negative.   Respiratory: Negative.  Negative for cough and shortness of breath.   Cardiovascular: Negative.  Negative for chest pain and leg swelling.  Gastrointestinal: Negative.  Negative for abdominal pain, blood in stool and melena.  Genitourinary: Negative.  Negative for hematuria.  Musculoskeletal: Negative.  Negative for back pain and joint pain.  Skin: Negative.  Negative for rash.  Neurological: Negative.  Negative for dizziness, sensory change, focal weakness and weakness.  Psychiatric/Behavioral: Negative.  The patient is not nervous/anxious.     As per HPI. Otherwise, a complete review of systems is negative.  ONCOLOGY HISTORY: Oncology History   No history exists.    PAST MEDICAL HISTORY: Past Medical History:  Diagnosis Date   Adenomatous colon polyp    Anemia    Arthritis    Basal cell carcinoma    left  supratip of nose nodular edges involved 08/27/20 Dr. Lehman Pummel, left postauricular sulcus nodular bcc 01/02/20 exc 01/23/20 neg margins   Blood transfusion without reported diagnosis    Cataract    Childhood asthma    AS A CHILD   Chronic heartburn    On omeprazole    Complication of anesthesia    HARD TO WAKE UP   Dyspnea    low hgb. and iron   chronic problem   External hemorrhoids    Frequency of urination    Frequent loose stools    GERD (gastroesophageal reflux disease)    Heme positive stool    History of hiatal hernia    large   Hyperlipidemia    Hypothyroidism    Iron  deficiency anemia    Melanoma (HCC)    left thigh s/p LN removal left groin   Metastatic melanoma (HCC)    Followed by Dr Serena Dana, previous chemo, no recurrence, 2008?   Multinodular goiter    Osteoporosis    SCC (squamous cell carcinoma)    skin    PAST SURGICAL HISTORY: Past Surgical History:  Procedure Laterality Date   ABDOMINAL HYSTERECTOMY  1984   BACK SURGERY  03/2016   LUMBAR   CARPAL TUNNEL RELEASE Right 09/07/2016   Procedure: CARPAL TUNNEL RELEASE;  Surgeon: Rande Bushy, MD;  Location: [facility] ORS;  Service: Orthopedics;  Laterality: Right;   CHOLECYSTECTOMY  1990   COLONOSCOPY  08/2006   Four sessile polyps found and removed in sigmoid colon at splenic flexure and ascending colon. 7 mm in size. Another removed from transverse colon 4 mm. Path Report - showed tubular adenoma and  hyperplastic polyp. Advised to repeat in 3.5 years (02/2010)   COLONOSCOPY  3.2.2011   8 mm polyp in sigmoid colon, removed, 4 mm polyp in descending colon, removed. Internal hemorrhoids   ESOPHAGOGASTRODUODENOSCOPY  3.2.2011   Large hiatia hernia, esophagus normal, mulitple small sessile polyps w/no stigmata of recent bleeding found. Mildy erythermatous mucosa w/no bleeding found in gatric antrum. Normal duodenum. 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 PATH - no active inflammation, antral mucosa w/mild  foveolar hyperplasia   FRACTURE SURGERY     Lymph Node Removal  12/2005   Left inguinal lymph node dissection   MELANOMA EXCISION Left 1993   Left thigh   ORIF ANKLE FRACTURE Right    Dr. Alan All   TOTAL HIP ARTHROPLASTY Right 11/18/2016   Procedure: TOTAL HIP ARTHROPLASTY;  Surgeon: Rande Bushy, MD;  Location: [facility] ORS;  Service: Orthopedics;  Laterality: Right;   TOTAL HIP ARTHROPLASTY     left    FAMILY HISTORY Family History  Problem Relation Age of Onset   Aneurysm Mother    Heart disease Mother    Colon polyps Father    Diabetes Father    Dementia Father    Stroke Father        TIA   Macular degeneration Father    Hypothyroidism Daughter    Hypothyroidism Son    Leukemia Maternal Grandfather    Skin cancer Paternal Grandfather    Diabetes Other    Colon polyps Other    Colon cancer Neg Hx    Breast cancer Neg Hx    Stomach cancer Neg Hx    Rectal cancer Neg Hx    Esophageal cancer Neg Hx     GYNECOLOGIC HISTORY:  No LMP recorded. Patient has had a hysterectomy.     ADVANCED DIRECTIVES:    HEALTH MAINTENANCE: Social History   Tobacco Use   Smoking status: Former    Current packs/day: 0.00    Average packs/day: 0.5 packs/day for 8.0 years (4.0 ttl pk-yrs)    Types: Cigarettes    Start date: 07/12/1970    Quit date: 07/12/1978    Years since quitting: 45.3   Smokeless tobacco: Never  Vaping Use   Vaping status: Never Used  Substance Use Topics   Alcohol  use: No    Alcohol /week: 0.0 standard drinks of alcohol    Drug use: No    Allergies  Allergen Reactions   Misc. Sulfonamide Containing Compounds Other (See Comments)   Sulfa Antibiotics Rash    Current Outpatient Medications  Medication Sig Dispense Refill   acetaminophen  (TYLENOL ) 650 MG CR tablet Take 1,300 mg by mouth daily.     B Complex Vitamins (B COMPLEX PO) Take by mouth.     Calcium  Carbonate-Vitamin D  600-400 MG-UNIT tablet Take by mouth.     citalopram  (CELEXA ) 20 MG tablet TAKE  1 TABLET DAILY 90 tablet 0   desonide (DESOWEN) 0.05 % cream Apply topically 2 (two) times daily.     Ferrous Sulfate  Dried 45 MG TBCR Take 45 mg by mouth daily with lunch.     levothyroxine  (SYNTHROID ) 125 MCG tablet TAKE 1 TABLET DAILY BEFORE BREAKFAST 84 tablet 1   loperamide  (IMODIUM ) 2 MG capsule Take 2 mg by mouth every morning.     Multiple Vitamins-Minerals (EYE VITAMINS & MINERALS) TABS Take 1 tablet by mouth daily.     pravastatin  (PRAVACHOL ) 20 MG tablet Take 1 tablet (20 mg total) by mouth daily. 90 tablet 1   ondansetron  (ZOFRAN )  4 MG tablet Take 1 tablet (4 mg total) by mouth every 8 (eight) hours as needed for nausea or vomiting. (Patient not taking: Reported on 11/17/2023) 20 tablet 0   No current facility-administered medications for this visit.   Facility-Administered Medications Ordered in Other Visits  Medication Dose Route Frequency Provider Last Rate Last Admin   0.9 %  sodium chloride  infusion   Intravenous PRN Theo Krumholz J, MD   Stopped at 03/13/21 0940    OBJECTIVE: BP 115/66 (BP Location: Left Arm, Patient Position: Sitting)   Pulse 70   Temp 98.2 F (36.8 C) (Tympanic)   Resp 16   Ht 5\' 3"  (1.6 m)   Wt 169 lb 3.2 oz (76.7 kg)   SpO2 96%   BMI 29.97 kg/m    Body mass index is 29.97 kg/m.    ECOG FS:0 - Asymptomatic  General: Well-developed, well-nourished, no acute distress. Eyes: Pink conjunctiva, anicteric sclera. HEENT: Normocephalic, moist mucous membranes. Lungs: No audible wheezing or coughing. Heart: Regular rate and rhythm. Abdomen: Soft, nontender, no obvious distention. Musculoskeletal: No edema, cyanosis, or clubbing. Neuro: Alert, answering all questions appropriately. Cranial nerves grossly intact. Skin: No rashes or petechiae noted. Psych: Normal affect.  LAB RESULTS:  CBC    Component Value Date/Time   WBC 5.9 11/16/2023 1053   RBC 4.82 11/16/2023 1053   HGB 13.8 11/16/2023 1053   HGB 12.6 06/27/2023 1517   HCT 42.4  11/16/2023 1053   HCT 40.3 06/27/2023 1517   PLT 126 (L) 11/16/2023 1053   PLT 137 (L) 06/27/2023 1517   MCV 88.0 11/16/2023 1053   MCV 89 06/27/2023 1517   MCV 74 (L) 12/12/2013 1015   MCH 28.6 11/16/2023 1053   MCHC 32.5 11/16/2023 1053   RDW 14.4 11/16/2023 1053   RDW 14.1 06/27/2023 1517   RDW 33.6 (H) 12/12/2013 1015   LYMPHSABS 1.0 11/16/2023 1053   LYMPHSABS 1.2 06/27/2023 1517   LYMPHSABS 1.2 12/12/2013 1015   MONOABS 0.4 11/16/2023 1053   MONOABS 0.4 12/12/2013 1015   EOSABS 0.2 11/16/2023 1053   EOSABS 0.2 06/27/2023 1517   EOSABS 0.1 12/12/2013 1015   BASOSABS 0.0 11/16/2023 1053   BASOSABS 0.0 06/27/2023 1517   BASOSABS 0.1 12/12/2013 1015   Lab Results  Component Value Date   IRON  53 11/16/2023   TIBC 300 11/16/2023   IRONPCTSAT 18 11/16/2023   Lab Results  Component Value Date   FERRITIN 29 11/16/2023      STUDIES: No results found.  ASSESSMENT & PLAN:    Iron  deficiency anemia: Resolved.  Patient's hemoglobin and iron  stores continue to be within normal limits.  Her most recent colonoscopy and EGD on May 02, 2020 did not reveal any distinct pathology.  She does not require additional IV Feraheme  today.  Patient last received treatment on July 22, 2022.  Patient has requested to continue to be followed in clinic, but less frequently therefore she will return to clinic in 6 months with repeat laboratory work, further evaluation, and consideration of treatment if necessary.   History of melanoma: Melanoma of left thigh status post resection in 1993, exact stage is not known, metastatic to inguinal lymph node. No evidence of recurrent disease.  I spent a total of 20 minutes reviewing chart data, face-to-face evaluation with the patient, counseling and coordination of care as detailed above.    Patient expressed understanding and was in agreement with this plan. She also understands that She can call clinic at any  time with any questions, concerns, or  complaints.    Shellie Dials, MD   11/17/2023 2:42 PM

## 2023-11-18 ENCOUNTER — Encounter: Payer: Self-pay | Admitting: Oncology

## 2023-11-18 NOTE — Telephone Encounter (Signed)
 Requested Prescriptions  Pending Prescriptions Disp Refills   citalopram  (CELEXA ) 20 MG tablet [Pharmacy Med Name: CITALOPRAM  HBR 20 MG TABLET] 90 tablet 0    Sig: TAKE 1 TABLET BY MOUTH EVERY DAY     Psychiatry:  Antidepressants - SSRI Passed - 11/18/2023  8:19 AM      Passed - Completed PHQ-2 or PHQ-9 in the last 360 days      Passed - Valid encounter within last 6 months    Recent Outpatient Visits   None     Future Appointments             In 1 month Aileen Alexanders, NP [facility]

## 2023-12-14 ENCOUNTER — Telehealth: Admitting: Physician Assistant

## 2023-12-14 DIAGNOSIS — R3989 Other symptoms and signs involving the genitourinary system: Secondary | ICD-10-CM

## 2023-12-14 MED ORDER — CEPHALEXIN 500 MG PO CAPS: 500.0000 mg | ORAL_CAPSULE | Freq: Two times a day (BID) | ORAL | 0 refills | Status: DC

## 2023-12-14 NOTE — Progress Notes (Signed)

## 2023-12-17 ENCOUNTER — Other Ambulatory Visit: Payer: Self-pay | Admitting: Nurse Practitioner

## 2023-12-17 DIAGNOSIS — E039 Hypothyroidism, unspecified: Secondary | ICD-10-CM

## 2023-12-19 NOTE — Telephone Encounter (Signed)
 Requested medications are due for refill today.  yes  Requested medications are on the active medications list.  yes  Last refill. 07/04/2023 #84 1rf  Future visit scheduled.   yes  Notes to clinic.  Abnormal labs.    Requested Prescriptions  Pending Prescriptions Disp Refills   levothyroxine  (SYNTHROID ) 125 MCG tablet [Pharmacy Med Name: LEVOTHYROXIN TAB 125MCG] 84 tablet 1    Sig: TAKE 1 TABLET DAILY BEFORE BREAKFAST     Endocrinology:  Hypothyroid Agents Failed - 12/19/2023  1:43 PM      Failed - TSH in normal range and within 360 days    TSH  Date Value Ref Range Status  06/27/2023 0.234 (L) 0.450 - 4.500 uIU/mL Final         Passed - Valid encounter within last 12 months    Recent Outpatient Visits   None

## 2023-12-26 ENCOUNTER — Ambulatory Visit: Payer: Self-pay | Admitting: Nurse Practitioner

## 2024-01-02 ENCOUNTER — Ambulatory Visit (INDEPENDENT_AMBULATORY_CARE_PROVIDER_SITE_OTHER): Admitting: Nurse Practitioner

## 2024-01-02 ENCOUNTER — Encounter: Payer: Self-pay | Admitting: Nurse Practitioner

## 2024-01-02 VITALS — BP 90/54 | HR 59 | Ht 63.0 in | Wt 164.0 lb

## 2024-01-02 DIAGNOSIS — E669 Obesity, unspecified: Secondary | ICD-10-CM | POA: Diagnosis not present

## 2024-01-02 DIAGNOSIS — E785 Hyperlipidemia, unspecified: Secondary | ICD-10-CM | POA: Diagnosis not present

## 2024-01-02 DIAGNOSIS — R7303 Prediabetes: Secondary | ICD-10-CM

## 2024-01-02 DIAGNOSIS — E039 Hypothyroidism, unspecified: Secondary | ICD-10-CM

## 2024-01-02 DIAGNOSIS — D649 Anemia, unspecified: Secondary | ICD-10-CM

## 2024-01-02 DIAGNOSIS — F32A Depression, unspecified: Secondary | ICD-10-CM

## 2024-01-02 DIAGNOSIS — F419 Anxiety disorder, unspecified: Secondary | ICD-10-CM

## 2024-01-02 MED ORDER — LEVOTHYROXINE SODIUM 125 MCG PO TABS: 125.0000 ug | ORAL_TABLET | Freq: Every day | ORAL | 1 refills | Status: DC

## 2024-01-02 MED ORDER — CITALOPRAM HYDROBROMIDE 20 MG PO TABS: 20.0000 mg | ORAL_TABLET | Freq: Every day | ORAL | 1 refills | Status: DC

## 2024-01-02 MED ORDER — PRAVASTATIN SODIUM 20 MG PO TABS: 20.0000 mg | ORAL_TABLET | Freq: Every day | ORAL | 1 refills | Status: DC

## 2024-01-02 NOTE — Progress Notes (Signed)
 BP (!) 90/54   Pulse (!) 59   Ht 5' 3 (1.6 m)   Wt 164 lb (74.4 kg)   SpO2 97%   BMI 29.05 kg/m    Subjective:    Patient ID: Janet Rice, female    DOB: 1946-04-30, 78 y.o.   MRN: [id number]  HPI: Janet Rice is a 78 y.o. female  Chief Complaint  Patient presents with   Medical Management of Chronic Issues   HYPOTHYROIDISM Taking 125mcg daily. Except on Sunday she takes a 1/2 a pill.  Feels like it is working well for her.  Denies concerns at visit today.  Thyroid  control status:controlled Satisfied with current treatment? yes Medication side effects: no Medication compliance: excellent compliance Etiology of hypothyroidism:  Recent dose adjustment:no Fatigue: yes Cold intolerance: yes Heat intolerance: no Weight gain: no Weight loss: no Constipation: no Diarrhea/loose stools: no Palpitations: no Lower extremity edema: no Anxiety/depressed mood: no   HYPERLIPIDEMIA Still tolerating the pravastatin  well.  Hyperlipidemia status: excellent compliance Satisfied with current treatment?  no Side effects:  muscle aches Medication compliance: excellent compliance Past cholesterol meds: pravastatin  (pravachol )- had side effects at 40mg  had been taking 20mg  and tolerating it well.   Supplements: none Aspirin:  no The 10-year ASCVD risk score (Arnett DK, et al., 2019) is: 10.6%   Values used to calculate the score:     Age: 55 years     Clincally relevant sex: Female     Is Non-Hispanic African American: No     Diabetic: No     Tobacco smoker: No     Systolic Blood Pressure: 90 mmHg     Is BP treated: No     HDL Cholesterol: 65 mg/dL     Total Cholesterol: 199 mg/dL Chest pain:  no Coronary artery disease:  no Family history CAD:  no Family history early CAD:  no  ANEMIA Followed by Hematology.  Receives PRN iron  infusions.  Receiving infusions as needed.  She hasn't been seen since July.    MOOD Feels like her mood is good.  Celexa  is  working well for her.  Denies concerns at visit today.   She denies any issues with the medication at this time.  She feels like this is a good dose for her. Denies SI.   Flowsheet Row Office Visit from 01/02/2024 in [facility]  PHQ-9 Total Score 0     Relevant past medical, surgical, family and social history reviewed and updated as indicated. Interim medical history since our last visit reviewed. Allergies and medications reviewed and updated.  Review of Systems  Constitutional:  Negative for fatigue and unexpected weight change.  Eyes:  Negative for visual disturbance.  Respiratory:  Negative for cough, chest tightness and shortness of breath.   Cardiovascular:  Negative for chest pain, palpitations and leg swelling.  Gastrointestinal:  Negative for constipation and diarrhea.  Endocrine: Negative for cold intolerance and heat intolerance.  Neurological:  Negative for dizziness and headaches.  Psychiatric/Behavioral:  Negative for dysphoric mood and suicidal ideas. The patient is not nervous/anxious.     Per HPI unless specifically indicated above     Objective:    BP (!) 90/54   Pulse (!) 59   Ht 5' 3 (1.6 m)   Wt 164 lb (74.4 kg)   SpO2 97%   BMI 29.05 kg/m   Wt Readings from Last 3 Encounters:  01/02/24 164 lb (74.4 kg)  11/17/23 169 lb 3.2 oz (  76.7 kg)  08/03/23 174 lb 3.2 oz (79 kg)    Physical Exam Vitals and nursing note reviewed.  Constitutional:      General: She is not in acute distress.    Appearance: Normal appearance. She is obese. She is not ill-appearing, toxic-appearing or diaphoretic.  HENT:     Head: Normocephalic.     Right Ear: External ear normal.     Left Ear: External ear normal.     Nose: Nose normal.     Mouth/Throat:     Mouth: Mucous membranes are moist.     Pharynx: Oropharynx is clear.   Eyes:     General:        Right eye: No discharge.        Left eye: No discharge.     Extraocular Movements:  Extraocular movements intact.     Conjunctiva/sclera: Conjunctivae normal.     Pupils: Pupils are equal, round, and reactive to light.    Cardiovascular:     Rate and Rhythm: Normal rate and regular rhythm.     Heart sounds: No murmur heard. Pulmonary:     Effort: Pulmonary effort is normal. No respiratory distress.     Breath sounds: Normal breath sounds. No wheezing or rales.   Musculoskeletal:     Cervical back: Normal range of motion and neck supple.   Skin:    General: Skin is warm and dry.     Capillary Refill: Capillary refill takes less than 2 seconds.   Neurological:     General: No focal deficit present.     Mental Status: She is alert and oriented to person, place, and time. Mental status is at baseline.   Psychiatric:        Mood and Affect: Mood normal.        Behavior: Behavior normal.        Thought Content: Thought content normal.        Judgment: Judgment normal.     Results for orders placed or performed in visit on 11/16/23  Iron  and TIBC([facility])   Collection Time: 11/16/23 10:53 AM  Result Value Ref Range   Iron  53 28 - 170 ug/dL   TIBC 699 749 - 549 ug/dL   Saturation Ratios 18 10.4 - 31.8 %   UIBC 247 ug/dL  Ferritin   Collection Time: 11/16/23 10:53 AM  Result Value Ref Range   Ferritin 29 11 - 307 ng/mL  CBC with Differential/Platelet   Collection Time: 11/16/23 10:53 AM  Result Value Ref Range   WBC 5.9 4.0 - 10.5 K/uL   RBC 4.82 3.87 - 5.11 MIL/uL   Hemoglobin 13.8 12.0 - 15.0 g/dL   HCT 57.5 63.9 - 53.9 %   MCV 88.0 80.0 - 100.0 fL   MCH 28.6 26.0 - 34.0 pg   MCHC 32.5 30.0 - 36.0 g/dL   RDW 85.5 88.4 - 84.4 %   Platelets 126 (L) 150 - 400 K/uL   nRBC 0.0 0.0 - 0.2 %   Neutrophils Relative % 72 %   Neutro Abs 4.3 1.7 - 7.7 K/uL   Lymphocytes Relative 18 %   Lymphs Abs 1.0 0.7 - 4.0 K/uL   Monocytes Relative 6 %   Monocytes Absolute 0.4 0.1 - 1.0 K/uL   Eosinophils Relative 3 %   Eosinophils Absolute 0.2 0.0 - 0.5 K/uL    Basophils Relative 1 %   Basophils Absolute 0.0 0.0 - 0.1 K/uL   Immature Granulocytes 0 %  Abs Immature Granulocytes 0.02 0.00 - 0.07 K/uL      Assessment & Plan:   Problem List Items Addressed This Visit       Endocrine   Hypothyroidism   Chronic.  Controlled.  Continue with current medication regimen on levothyroxine  125mcg daily and 1/2 tab on Sunday.  Refills sent today.  Labs ordered today.  Return to clinic in 6 months for reevaluation.  Call sooner if concerns arise.       Relevant Medications   levothyroxine  (SYNTHROID ) 125 MCG tablet   Other Relevant Orders   Comprehensive metabolic panel with GFR   T4   TSH     Other   Hyperlipidemia   Chronic.  Controlled.  Continue with current medication regimen of Pravastatin .  Did not tolerate Pravastatin  40mg .  Labs ordered today.  Return to clinic in 6 months for reevaluation.  Call sooner if concerns arise.       Relevant Medications   pravastatin  (PRAVACHOL ) 20 MG tablet   Other Relevant Orders   Comprehensive metabolic panel with GFR   Lipid panel   Obesity (BMI 30-39.9)   Recommended eating smaller high protein, low fat meals more frequently and exercising 30 mins a day 5 times a week with a goal of 10-15lb weight loss in the next 3 months.       Anxiety and depression   Chronic.  Controlled.  Continue with current medication regimen.  Labs ordered today.  Return to clinic in 6 months for reevaluation.  Call sooner if concerns arise.        Relevant Medications   citalopram  (CELEXA ) 20 MG tablet   Other Relevant Orders   Comprehensive metabolic panel with GFR   Normocytic anemia   Chronic.  Followed up with Hematology in May.  Recent note reviewed.  Continue to follow up with specialist.       Relevant Orders   Comprehensive metabolic panel with GFR   Prediabetes - Primary   Labs ordered at visit today.  Will make recommendations based on lab results.       Relevant Orders   Hemoglobin A1c        Follow up plan: No follow-ups on file.

## 2024-01-02 NOTE — Assessment & Plan Note (Signed)
 Chronic.  Controlled.  Continue with current medication regimen.  Labs ordered today.  Return to clinic in 6 months for reevaluation.  Call sooner if concerns arise.  ? ?

## 2024-01-02 NOTE — Assessment & Plan Note (Signed)
 Recommended eating smaller high protein, low fat meals more frequently and exercising 30 mins a day 5 times a week with a goal of 10-15lb weight loss in the next 3 months.

## 2024-01-02 NOTE — Assessment & Plan Note (Signed)
 Chronic.  Followed up with Hematology in May.  Recent note reviewed.  Continue to follow up with specialist.

## 2024-01-02 NOTE — Assessment & Plan Note (Signed)
 Labs ordered at visit today.  Will make recommendations based on lab results.

## 2024-01-02 NOTE — Assessment & Plan Note (Signed)
Chronic.  Controlled.  Continue with current medication regimen on levothyroxine daily and 1/2 tab on Sunday.  Refills sent today.  Labs ordered today.  Return to clinic in 6 months for reevaluation.  Call sooner if concerns arise.

## 2024-01-02 NOTE — Assessment & Plan Note (Signed)
 Chronic.  Controlled.  Continue with current medication regimen of Pravastatin .  Did not tolerate Pravastatin  40mg .  Labs ordered today.  Return to clinic in 6 months for reevaluation.  Call sooner if concerns arise.

## 2024-01-03 ENCOUNTER — Ambulatory Visit: Payer: Self-pay | Admitting: Nurse Practitioner

## 2024-01-03 LAB — COMPREHENSIVE METABOLIC PANEL WITH GFR
ALT: 12 IU/L (ref 0–32)
AST: 18 IU/L (ref 0–40)
Albumin: 3.9 g/dL (ref 3.8–4.8)
Alkaline Phosphatase: 120 IU/L (ref 44–121)
BUN/Creatinine Ratio: 9 — ABNORMAL LOW (ref 12–28)
BUN: 9 mg/dL (ref 8–27)
Bilirubin Total: 0.5 mg/dL (ref 0.0–1.2)
CO2: 25 mmol/L (ref 20–29)
Calcium: 9.5 mg/dL (ref 8.7–10.3)
Chloride: 99 mmol/L (ref 96–106)
Creatinine, Ser: 1.03 mg/dL — ABNORMAL HIGH (ref 0.57–1.00)
Globulin, Total: 2.7 g/dL (ref 1.5–4.5)
Glucose: 89 mg/dL (ref 70–99)
Potassium: 4.9 mmol/L (ref 3.5–5.2)
Sodium: 137 mmol/L (ref 134–144)
Total Protein: 6.6 g/dL (ref 6.0–8.5)
eGFR: 56 mL/min/{1.73_m2} — ABNORMAL LOW (ref >59)

## 2024-01-03 LAB — HEMOGLOBIN A1C
Est. average glucose Bld gHb Est-mCnc: 111 mg/dL
Hgb A1c MFr Bld: 5.5 % (ref 4.8–5.6)

## 2024-01-03 LAB — TSH: TSH: 0.068 u[IU]/mL — ABNORMAL LOW (ref 0.450–4.500)

## 2024-01-03 LAB — LIPID PANEL
Chol/HDL Ratio: 3.4 ratio (ref 0.0–4.4)
Cholesterol, Total: 209 mg/dL — ABNORMAL HIGH (ref 100–199)
HDL: 62 mg/dL (ref >39)
LDL Chol Calc (NIH): 125 mg/dL — ABNORMAL HIGH (ref 0–99)
Triglycerides: 125 mg/dL (ref 0–149)
VLDL Cholesterol Cal: 22 mg/dL (ref 5–40)

## 2024-01-03 LAB — T4: T4, Total: 9.6 ug/dL (ref 4.5–12.0)

## 2024-01-22 ENCOUNTER — Other Ambulatory Visit: Payer: Self-pay | Admitting: Nurse Practitioner

## 2024-01-22 DIAGNOSIS — F419 Anxiety disorder, unspecified: Secondary | ICD-10-CM

## 2024-01-24 NOTE — Telephone Encounter (Signed)
 Refused Celexa  20 mg.   Already handled by other means.  Sent to [id number] on 01/02/2024 #90, 1 refill.   This request from [facility].

## 2024-01-26 ENCOUNTER — Telehealth

## 2024-01-26 DIAGNOSIS — C44519 Basal cell carcinoma of skin of other part of trunk: Secondary | ICD-10-CM | POA: Diagnosis not present

## 2024-01-26 DIAGNOSIS — Z85820 Personal history of malignant melanoma of skin: Secondary | ICD-10-CM | POA: Diagnosis not present

## 2024-01-26 DIAGNOSIS — Z85828 Personal history of other malignant neoplasm of skin: Secondary | ICD-10-CM | POA: Diagnosis not present

## 2024-01-26 DIAGNOSIS — D225 Melanocytic nevi of trunk: Secondary | ICD-10-CM | POA: Diagnosis not present

## 2024-01-26 DIAGNOSIS — D2272 Melanocytic nevi of left lower limb, including hip: Secondary | ICD-10-CM | POA: Diagnosis not present

## 2024-01-26 DIAGNOSIS — D2261 Melanocytic nevi of right upper limb, including shoulder: Secondary | ICD-10-CM | POA: Diagnosis not present

## 2024-01-26 DIAGNOSIS — N390 Urinary tract infection, site not specified: Secondary | ICD-10-CM

## 2024-01-26 DIAGNOSIS — D2262 Melanocytic nevi of left upper limb, including shoulder: Secondary | ICD-10-CM | POA: Diagnosis not present

## 2024-01-26 DIAGNOSIS — D485 Neoplasm of uncertain behavior of skin: Secondary | ICD-10-CM | POA: Diagnosis not present

## 2024-01-27 NOTE — Progress Notes (Signed)
  Because you have recently had a UTI last month and have not had any recent urine testing, I feel your condition warrants further evaluation and I recommend that you be seen in a face-to-face visit.   NOTE: There will be NO CHARGE for this E-Visit   If you are having a true medical emergency, please call 911.     For an urgent face to face visit, [place] has multiple urgent care centers for your convenience.  Click the link below for the full list of locations and hours, walk-in wait times, appointment scheduling options and driving directions:  Urgent Care - [place], [place], [place], [place], [place], [place]       Your MyChart E-visit questionnaire answers were reviewed by a board certified advanced clinical practitioner to complete your personal care plan based on your specific symptoms.    Thank you for using e-Visits.     I have spent 5 minutes in review of e-visit questionnaire, review and updating patient chart, medical decision making and response to patient.   Delon CHRISTELLA Dickinson, PA-C

## 2024-03-09 DIAGNOSIS — R350 Frequency of micturition: Secondary | ICD-10-CM | POA: Diagnosis not present

## 2024-03-09 DIAGNOSIS — R300 Dysuria: Secondary | ICD-10-CM | POA: Diagnosis not present

## 2024-03-09 DIAGNOSIS — Z6828 Body mass index (BMI) 28.0-28.9, adult: Secondary | ICD-10-CM | POA: Diagnosis not present

## 2024-03-22 DIAGNOSIS — C44519 Basal cell carcinoma of skin of other part of trunk: Secondary | ICD-10-CM | POA: Diagnosis not present

## 2024-03-29 ENCOUNTER — Ambulatory Visit: Payer: Self-pay | Admitting: *Deleted

## 2024-03-29 NOTE — Telephone Encounter (Signed)
  FYI Only or Action Required?: FYI only for provider.  Patient was last seen in primary care on 01/02/2024 by Melvin Pao, NP.  Called Nurse Triage reporting Urinary Frequency.  Symptoms began today.  Interventions attempted: Nothing.  Symptoms are: gradually worsening.  Triage Disposition: See Physician Within 24 Hours  Patient/caregiver understands and will follow disposition?: Yes Message from Ameerah G sent at 03/29/2024 12:10 PM EDT  Pt believes she has a uti, no pain,no burning just pressure and frequent bathroom trips. Please advise [phone]   Reason for Disposition  Urinating more frequently than usual (i.e., frequency) OR new-onset of the feeling of an urgent need to urinate (i.e., urgency)  Answer Assessment - Initial Assessment Questions 1. SYMPTOM: What's the main symptom you're concerned about? (e.g., frequency, incontinence)    bladder Pressure, increased frequency 2. ONSET: When did the  symptoms  start?     today 3. PAIN: Is there any pain? If Yes, ask: How bad is it? (Scale: 1-10; mild, moderate, severe)     denies 4. CAUSE: What do you think is causing the symptoms?     Possible UTI 5. OTHER SYMPTOMS: Do you have any other symptoms? (e.g., blood in urine, fever, flank pain, pain with urination)     denies 6. PREGNANCY: Is there any chance you are pregnant? When was your last menstrual period?     N/a  Protocols used: Urinary Symptoms-A-AH

## 2024-03-29 NOTE — Telephone Encounter (Signed)
 First attempt to return her call.   Left a voicemail to call back.   Routed back to Triage queue for further attempts.

## 2024-03-29 NOTE — Telephone Encounter (Signed)
 Message from Avram MATSU sent at 03/29/2024 12:10 PM EDT  Pt believes she has a uti, no pain,no burning just pressure and frequent bathroom trips. Please advise [phone]    Call History  Contact Date/Time Type Contact Phone/Fax By  03/29/2024 12:08 PM EDT Phone (Incoming) Dyann Almarie KATHEE Landry (Self) [phone] Arlyss Avram SAILOR

## 2024-03-30 ENCOUNTER — Ambulatory Visit (INDEPENDENT_AMBULATORY_CARE_PROVIDER_SITE_OTHER): Admitting: Nurse Practitioner

## 2024-03-30 ENCOUNTER — Encounter: Payer: Self-pay | Admitting: Nurse Practitioner

## 2024-03-30 VITALS — BP 113/68 | HR 54 | Ht 62.0 in | Wt 160.4 lb

## 2024-03-30 DIAGNOSIS — R8281 Pyuria: Secondary | ICD-10-CM

## 2024-03-30 DIAGNOSIS — N3001 Acute cystitis with hematuria: Secondary | ICD-10-CM | POA: Diagnosis not present

## 2024-03-30 DIAGNOSIS — R399 Unspecified symptoms and signs involving the genitourinary system: Secondary | ICD-10-CM | POA: Diagnosis not present

## 2024-03-30 LAB — MICROSCOPIC EXAMINATION

## 2024-03-30 LAB — URINALYSIS, ROUTINE W REFLEX MICROSCOPIC
Bilirubin, UA: NEGATIVE
Glucose, UA: NEGATIVE
Ketones, UA: NEGATIVE
Nitrite, UA: NEGATIVE
Protein,UA: NEGATIVE
Specific Gravity, UA: 1.01 (ref 1.005–1.030)
Urobilinogen, Ur: 0.2 mg/dL (ref 0.2–1.0)
pH, UA: 5.5 (ref 5.0–7.5)

## 2024-03-30 MED ORDER — CEPHALEXIN 500 MG PO CAPS: 500.0000 mg | ORAL_CAPSULE | Freq: Two times a day (BID) | ORAL | 0 refills | Status: AC

## 2024-03-30 NOTE — Progress Notes (Signed)
 BP 113/68 (BP Location: Left Arm, Patient Position: Sitting, Cuff Size: Large)   Pulse (!) 54   Ht 5' 2 (1.575 m)   Wt 160 lb 6.4 oz (72.8 kg)   SpO2 97%   BMI 29.34 kg/m    Subjective:    Patient ID: Janet Rice, female    DOB: Feb 14, 1946, 78 y.o.   MRN: [id number]  HPI: [name] is a 78 y.o. female  Chief Complaint  Patient presents with   Urinary Tract Infection    Sense of Urgency    URINARY SYMPTOMS Started yesterday morning, is supposed to go out of town tomorrow. Pressure and urgency. Dysuria: no Urinary frequency: yes Urgency: yes Small volume voids: yes Symptom severity: yes Urinary incontinence: no Foul odor: no Hematuria: no Abdominal pain: no Back pain: at baseline Suprapubic pain/pressure: yes Flank pain: no Fever:  no Vomiting: no Status: stable Previous urinary tract infection: yes Recurrent urinary tract infection: no Sexual activity: No sexually active Treatments attempted: increasing fluids     Relevant past medical, surgical, family and social history reviewed and updated as indicated. Interim medical history since our last visit reviewed. Allergies and medications reviewed and updated.  Review of Systems  Constitutional:  Negative for activity change, appetite change, diaphoresis, fatigue and fever.  Respiratory:  Negative for cough, chest tightness, shortness of breath and wheezing.   Cardiovascular:  Negative for chest pain, palpitations and leg swelling.  Gastrointestinal: Negative.   Genitourinary:  Positive for decreased urine volume, dysuria, frequency and urgency. Negative for hematuria.  Psychiatric/Behavioral: Negative.      Per HPI unless specifically indicated above     Objective:    BP 113/68 (BP Location: Left Arm, Patient Position: Sitting, Cuff Size: Large)   Pulse (!) 54   Ht 5' 2 (1.575 m)   Wt 160 lb 6.4 oz (72.8 kg)   SpO2 97%   BMI 29.34 kg/m   Wt Readings from Last 3 Encounters:  03/30/24  160 lb 6.4 oz (72.8 kg)  01/02/24 164 lb (74.4 kg)  11/17/23 169 lb 3.2 oz (76.7 kg)    Physical Exam Vitals and nursing note reviewed.  Constitutional:      General: She is awake. She is not in acute distress.    Appearance: She is well-developed and well-groomed. She is not ill-appearing or toxic-appearing.  HENT:     Head: Normocephalic.     Right Ear: Hearing and external ear normal.     Left Ear: Hearing and external ear normal.  Eyes:     General: Lids are normal.        Right eye: No discharge.        Left eye: No discharge.     Conjunctiva/sclera: Conjunctivae normal.     Pupils: Pupils are equal, round, and reactive to light.  Neck:     Thyroid : No thyromegaly.     Vascular: No carotid bruit.  Cardiovascular:     Rate and Rhythm: Normal rate and regular rhythm.     Heart sounds: Normal heart sounds. No murmur heard.    No gallop.  Pulmonary:     Effort: Pulmonary effort is normal. No accessory muscle usage or respiratory distress.     Breath sounds: Normal breath sounds.  Abdominal:     General: Bowel sounds are normal. There is no distension.     Palpations: Abdomen is soft.     Tenderness: There is no abdominal tenderness. There is no right CVA tenderness  or left CVA tenderness.  Musculoskeletal:     Cervical back: Normal range of motion and neck supple.     Right lower leg: No edema.     Left lower leg: No edema.  Lymphadenopathy:     Cervical: No cervical adenopathy.  Skin:    General: Skin is warm and dry.  Neurological:     Mental Status: She is alert and oriented to person, place, and time.     Deep Tendon Reflexes: Reflexes are normal and symmetric.     Reflex Scores:      Brachioradialis reflexes are 2+ on the right side and 2+ on the left side.      Patellar reflexes are 2+ on the right side and 2+ on the left side. Psychiatric:        Attention and Perception: Attention normal.        Mood and Affect: Mood normal.        Speech: Speech normal.         Behavior: Behavior normal. Behavior is cooperative.        Thought Content: Thought content normal.     Results for orders placed or performed in visit on 01/02/24  Comprehensive metabolic panel with GFR   Collection Time: 01/02/24 11:26 AM  Result Value Ref Range   Glucose 89 70 - 99 mg/dL   BUN 9 8 - 27 mg/dL   Creatinine, Ser 8.96 (H) 0.57 - 1.00 mg/dL   eGFR 56 (L) >40 fO/fpw/8.26   BUN/Creatinine Ratio 9 (L) 12 - 28   Sodium 137 134 - 144 mmol/L   Potassium 4.9 3.5 - 5.2 mmol/L   Chloride 99 96 - 106 mmol/L   CO2 25 20 - 29 mmol/L   Calcium  9.5 8.7 - 10.3 mg/dL   Total Protein 6.6 6.0 - 8.5 g/dL   Albumin 3.9 3.8 - 4.8 g/dL   Globulin, Total 2.7 1.5 - 4.5 g/dL   Bilirubin Total 0.5 0.0 - 1.2 mg/dL   Alkaline Phosphatase 120 44 - 121 IU/L   AST 18 0 - 40 IU/L   ALT 12 0 - 32 IU/L  Hemoglobin A1c   Collection Time: 01/02/24 11:26 AM  Result Value Ref Range   Hgb A1c MFr Bld 5.5 4.8 - 5.6 %   Est. average glucose Bld gHb Est-mCnc 111 mg/dL  Lipid panel   Collection Time: 01/02/24 11:26 AM  Result Value Ref Range   Cholesterol, Total 209 (H) 100 - 199 mg/dL   Triglycerides 874 0 - 149 mg/dL   HDL 62 >60 mg/dL   VLDL Cholesterol Cal 22 5 - 40 mg/dL   LDL Chol Calc (NIH) 874 (H) 0 - 99 mg/dL   Chol/HDL Ratio 3.4 0.0 - 4.4 ratio  T4   Collection Time: 01/02/24 11:26 AM  Result Value Ref Range   T4, Total 9.6 4.5 - 12.0 ug/dL  TSH   Collection Time: 01/02/24 11:26 AM  Result Value Ref Range   TSH 0.068 (L) 0.450 - 4.500 uIU/mL      Assessment & Plan:   Problem List Items Addressed This Visit       Genitourinary   Acute cystitis with hematuria - Primary   Acute with symptoms 24 hours.  UA showing some positive findings, including 1+ BLD and 6-10 WBC.  Will start Keflex  500 MG BID for 5 days. Current CrCl 116.  Send for culture and if changes needed will alert patient.  Discussed this plan with her.  Take probiotic or probiotic yogurt when taking abx therapy.   Ensure plenty of fluid.  She reports recurrence of UTIs recently.  Discussed with her that could consider vaginal estrace in future if ongoing recurrence.      Relevant Orders   Urinalysis, Routine w reflex microscopic   Other Visit Diagnoses       Pyuria       Relevant Orders   Urine Culture        Follow up plan: Return if symptoms worsen or fail to improve.

## 2024-03-30 NOTE — Patient Instructions (Signed)

## 2024-03-30 NOTE — Assessment & Plan Note (Signed)
 Acute with symptoms 24 hours.  UA showing some positive findings, including 1+ BLD and 6-10 WBC.  Will start Keflex  500 MG BID for 5 days. Current CrCl 116.  Send for culture and if changes needed will alert patient.  Discussed this plan with her.  Take probiotic or probiotic yogurt when taking abx therapy.  Ensure plenty of fluid.  She reports recurrence of UTIs recently.  Discussed with her that could consider vaginal estrace in future if ongoing recurrence.

## 2024-04-03 ENCOUNTER — Ambulatory Visit: Payer: Self-pay | Admitting: Nurse Practitioner

## 2024-04-03 LAB — URINE CULTURE

## 2024-04-03 NOTE — Progress Notes (Signed)
 The antibiotic that you are taking is susceptible to bacteria growing in urine, continue this until complete.

## 2024-04-20 DIAGNOSIS — Z23 Encounter for immunization: Secondary | ICD-10-CM | POA: Diagnosis not present

## 2024-04-24 DIAGNOSIS — M1712 Unilateral primary osteoarthritis, left knee: Secondary | ICD-10-CM | POA: Diagnosis not present

## 2024-05-14 ENCOUNTER — Other Ambulatory Visit: Payer: Self-pay | Admitting: *Deleted

## 2024-05-14 DIAGNOSIS — D509 Iron deficiency anemia, unspecified: Secondary | ICD-10-CM

## 2024-05-15 ENCOUNTER — Inpatient Hospital Stay: Attending: Oncology

## 2024-05-15 DIAGNOSIS — M810 Age-related osteoporosis without current pathological fracture: Secondary | ICD-10-CM | POA: Diagnosis not present

## 2024-05-15 DIAGNOSIS — D509 Iron deficiency anemia, unspecified: Secondary | ICD-10-CM | POA: Diagnosis present

## 2024-05-15 DIAGNOSIS — Z85820 Personal history of malignant melanoma of skin: Secondary | ICD-10-CM | POA: Diagnosis not present

## 2024-05-15 DIAGNOSIS — Z87891 Personal history of nicotine dependence: Secondary | ICD-10-CM | POA: Diagnosis not present

## 2024-05-15 LAB — IRON AND TIBC
Iron: 58 ug/dL (ref 28–170)
Saturation Ratios: 21 % (ref 10.4–31.8)
TIBC: 280 ug/dL (ref 250–450)
UIBC: 222 ug/dL

## 2024-05-15 LAB — CBC WITH DIFFERENTIAL/PLATELET
Abs Immature Granulocytes: 0.01 K/uL (ref 0.00–0.07)
Basophils Absolute: 0 K/uL (ref 0.0–0.1)
Basophils Relative: 1 %
Eosinophils Absolute: 0.2 K/uL (ref 0.0–0.5)
Eosinophils Relative: 3 %
HCT: 42.1 % (ref 36.0–46.0)
Hemoglobin: 13.9 g/dL (ref 12.0–15.0)
Immature Granulocytes: 0 %
Lymphocytes Relative: 18 %
Lymphs Abs: 1.1 K/uL (ref 0.7–4.0)
MCH: 29.4 pg (ref 26.0–34.0)
MCHC: 33 g/dL (ref 30.0–36.0)
MCV: 89 fL (ref 80.0–100.0)
Monocytes Absolute: 0.4 K/uL (ref 0.1–1.0)
Monocytes Relative: 7 %
Neutro Abs: 4.4 K/uL (ref 1.7–7.7)
Neutrophils Relative %: 71 %
Platelets: 116 K/uL — ABNORMAL LOW (ref 150–400)
RBC: 4.73 MIL/uL (ref 3.87–5.11)
RDW: 14 % (ref 11.5–15.5)
WBC: 6.1 K/uL (ref 4.0–10.5)
nRBC: 0 % (ref 0.0–0.2)

## 2024-05-15 LAB — FERRITIN: Ferritin: 52 ng/mL (ref 11–307)

## 2024-05-16 ENCOUNTER — Inpatient Hospital Stay

## 2024-05-16 ENCOUNTER — Encounter: Payer: Self-pay | Admitting: Oncology

## 2024-05-16 ENCOUNTER — Inpatient Hospital Stay (HOSPITAL_BASED_OUTPATIENT_CLINIC_OR_DEPARTMENT_OTHER): Admitting: Oncology

## 2024-05-16 VITALS — BP 106/63 | HR 53 | Temp 97.4°F | Resp 18 | Ht 62.0 in | Wt 159.0 lb

## 2024-05-16 DIAGNOSIS — M810 Age-related osteoporosis without current pathological fracture: Secondary | ICD-10-CM | POA: Diagnosis not present

## 2024-05-16 DIAGNOSIS — D509 Iron deficiency anemia, unspecified: Secondary | ICD-10-CM

## 2024-05-16 DIAGNOSIS — Z85820 Personal history of malignant melanoma of skin: Secondary | ICD-10-CM | POA: Diagnosis not present

## 2024-05-16 DIAGNOSIS — Z87891 Personal history of nicotine dependence: Secondary | ICD-10-CM | POA: Diagnosis not present

## 2024-05-16 NOTE — Progress Notes (Unsigned)
 Patient is doing good, no new questions for the doctor today

## 2024-05-16 NOTE — Progress Notes (Unsigned)
 [facility]  [phone]  [phone]) [phone]     Janet Rice DOB: June 11, 1946  MR#: [id number]  RDW#:[id number]  Patient Care Team: Melvin Pao, NP as PCP - General (Nurse Practitioner) Rice Janet PARAS, MD as Consulting Physician (Hematology and Oncology)   CHIEF COMPLAINT: Iron  deficiency anemia.  INTERVAL HISTORY: Patient returns to clinic today for repeat laboratory, further evaluation, and consideration of additional IV Feraheme .  She currently feels well and is asymptomatic.  She does not complain of any weakness or fatigue.  She has no neurologic complaints. She denies any recent fevers or illnesses. She has a good appetite and denies weight loss.  She denies any chest pain, shortness of breath, cough, or hemoptysis.  She denies any nausea, vomiting, constipation, or diarrhea. She has no melena or hematochezia. She has no urinary complaints.  Patient offers no specific complaints today.  REVIEW OF SYSTEMS:   Review of Systems  Constitutional: Negative.  Negative for fever, malaise/fatigue and weight loss.  Eyes: Negative.   Respiratory: Negative.  Negative for cough and shortness of breath.   Cardiovascular: Negative.  Negative for chest pain and leg swelling.  Gastrointestinal: Negative.  Negative for abdominal pain, blood in stool and melena.  Genitourinary: Negative.  Negative for hematuria.  Musculoskeletal: Negative.  Negative for back pain and joint pain.  Skin: Negative.  Negative for rash.  Neurological: Negative.  Negative for dizziness, sensory change, focal weakness and weakness.  Psychiatric/Behavioral: Negative.  The patient is not nervous/anxious.     As per HPI. Otherwise, a complete review of systems is negative.  ONCOLOGY HISTORY: Oncology History   No history exists.    PAST MEDICAL HISTORY: Past Medical History:  Diagnosis Date  . Adenomatous colon polyp   . Anemia   . Arthritis   . Basal cell carcinoma     left supratip of nose nodular edges involved 08/27/20 Dr. Chrystie, left postauricular sulcus nodular bcc 01/02/20 exc 01/23/20 neg margins  . Blood transfusion without reported diagnosis   . Cataract   . Childhood asthma    AS A CHILD  . Chronic heartburn    On omeprazole   . Complication of anesthesia    HARD TO WAKE UP  . Dyspnea    low hgb. and iron   chronic problem  . External hemorrhoids   . Frequency of urination   . Frequent loose stools   . GERD (gastroesophageal reflux disease)   . Heme positive stool   . History of hiatal hernia    large  . Hyperlipidemia   . Hypothyroidism   . Iron  deficiency anemia   . Melanoma (HCC)    left thigh s/p LN removal left groin  . Metastatic melanoma (HCC)    Followed by Dr Wilder, previous chemo, no recurrence, 2008?  . Multinodular goiter   . Osteoporosis   . SCC (squamous cell carcinoma)    skin    PAST SURGICAL HISTORY: Past Surgical History:  Procedure Laterality Date  . ABDOMINAL HYSTERECTOMY  1984  . BACK SURGERY  03/2016   LUMBAR  . CARPAL TUNNEL RELEASE Right 09/07/2016   Procedure: CARPAL TUNNEL RELEASE;  Surgeon: Franky Cranker, MD;  Location: [facility] ORS;  Service: Orthopedics;  Laterality: Right;  . CHOLECYSTECTOMY  1990  . COLONOSCOPY  08/2006   Four sessile polyps found and removed in sigmoid colon at splenic flexure and ascending colon. 7 mm in size. Another removed from transverse colon 4 mm. Path Report - showed tubular adenoma and  hyperplastic polyp. Advised to repeat in 3.5 years (02/2010)  . COLONOSCOPY  3.2.2011   8 mm polyp in sigmoid colon, removed, 4 mm polyp in descending colon, removed. Internal hemorrhoids  . ESOPHAGOGASTRODUODENOSCOPY  3.2.2011   Large hiatia hernia, esophagus normal, mulitple small sessile polyps w/no stigmata of recent bleeding found. Mildy erythermatous mucosa w/no bleeding found in gatric antrum. Normal duodenum. 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 PATH - no active  inflammation, antral mucosa w/mild foveolar hyperplasia  . FRACTURE SURGERY    . Lymph Node Removal  12/2005   Left inguinal lymph node dissection  . MELANOMA EXCISION Left 1993   Left thigh  . ORIF ANKLE FRACTURE Right    Dr. Krasinski  . TOTAL HIP ARTHROPLASTY Right 11/18/2016   Procedure: TOTAL HIP ARTHROPLASTY;  Surgeon: Marchia Drivers, MD;  Location: [facility] ORS;  Service: Orthopedics;  Laterality: Right;  . TOTAL HIP ARTHROPLASTY     left    FAMILY HISTORY Family History  Problem Relation Age of Onset  . Aneurysm Mother   . Heart disease Mother   . Colon polyps Father   . Diabetes Father   . Dementia Father   . Stroke Father        TIA  . Macular degeneration Father   . Hypothyroidism Daughter   . Hypothyroidism Son   . Leukemia Maternal Grandfather   . Skin cancer Paternal Grandfather   . Diabetes Other   . Colon polyps Other   . Colon cancer Neg Hx   . Breast cancer Neg Hx   . Stomach cancer Neg Hx   . Rectal cancer Neg Hx   . Esophageal cancer Neg Hx     GYNECOLOGIC HISTORY:  No LMP recorded. Patient has had a hysterectomy.     ADVANCED DIRECTIVES:    HEALTH MAINTENANCE: Social History   Tobacco Use  . Smoking status: Former    Current packs/day: 0.00    Average packs/day: 0.5 packs/day for 8.0 years (4.0 ttl pk-yrs)    Types: Cigarettes    Start date: 07/12/1970    Quit date: 07/12/1978    Years since quitting: 45.8  . Smokeless tobacco: Never  Vaping Use  . Vaping status: Never Used  Substance Use Topics  . Alcohol  use: No    Alcohol /week: 0.0 standard drinks of alcohol   . Drug use: No    Allergies  Allergen Reactions  . Misc. Sulfonamide Containing Compounds Other (See Comments)  . Sulfa Antibiotics Rash    Current Outpatient Medications  Medication Sig Dispense Refill  . acetaminophen  (TYLENOL ) 650 MG CR tablet Take 1,300 mg by mouth daily.    . B Complex Vitamins (B COMPLEX PO) Take by mouth.    . Calcium  Carbonate-Vitamin D  600-400  MG-UNIT tablet Take by mouth.    . citalopram  (CELEXA ) 20 MG tablet Take 1 tablet (20 mg total) by mouth daily. 90 tablet 1  . desonide (DESOWEN) 0.05 % cream Apply topically 2 (two) times daily.    . Ferrous Sulfate  Dried 45 MG TBCR Take 45 mg by mouth daily with lunch.    . levothyroxine  (SYNTHROID ) 125 MCG tablet Take 1 tablet (125 mcg total) by mouth daily before breakfast. 84 tablet 1  . loperamide  (IMODIUM ) 2 MG capsule Take 2 mg by mouth every morning.    . Multiple Vitamins-Minerals (EYE VITAMINS & MINERALS) TABS Take 1 tablet by mouth daily.    . pravastatin  (PRAVACHOL ) 20 MG tablet Take 1 tablet (20 mg total)  by mouth daily. 90 tablet 1   No current facility-administered medications for this visit.   Facility-Administered Medications Ordered in Other Visits  Medication Dose Route Frequency Provider Last Rate Last Admin  . 0.9 %  sodium chloride  infusion   Intravenous PRN Rice Janet PARAS, MD   Stopped at 03/13/21 0940    OBJECTIVE: BP 106/63 (BP Location: Left Arm, Patient Position: Sitting, Cuff Size: Normal)   Pulse (!) 53   Temp (!) 97.4 F (36.3 C) (Tympanic)   Resp 18   Ht 5' 2 (1.575 m)   Wt 159 lb (72.1 kg)   SpO2 100%   BMI 29.08 kg/m    Body mass index is 29.08 kg/m.    ECOG FS:0 - Asymptomatic  General: Well-developed, well-nourished, no acute distress. Eyes: Pink conjunctiva, anicteric sclera. HEENT: Normocephalic, moist mucous membranes. Lungs: No audible wheezing or coughing. Heart: Regular rate and rhythm. Abdomen: Soft, nontender, no obvious distention. Musculoskeletal: No edema, cyanosis, or clubbing. Neuro: Alert, answering all questions appropriately. Cranial nerves grossly intact. Skin: No rashes or petechiae noted. Psych: Normal affect.  LAB RESULTS:  CBC    Component Value Date/Time   WBC 6.1 05/15/2024 1119   RBC 4.73 05/15/2024 1119   HGB 13.9 05/15/2024 1119   HGB 12.6 06/27/2023 1517   HCT 42.1 05/15/2024 1119   HCT 40.3  06/27/2023 1517   PLT 116 (L) 05/15/2024 1119   PLT 137 (L) 06/27/2023 1517   MCV 89.0 05/15/2024 1119   MCV 89 06/27/2023 1517   MCV 74 (L) 12/12/2013 1015   MCH 29.4 05/15/2024 1119   MCHC 33.0 05/15/2024 1119   RDW 14.0 05/15/2024 1119   RDW 14.1 06/27/2023 1517   RDW 33.6 (H) 12/12/2013 1015   LYMPHSABS 1.1 05/15/2024 1119   LYMPHSABS 1.2 06/27/2023 1517   LYMPHSABS 1.2 12/12/2013 1015   MONOABS 0.4 05/15/2024 1119   MONOABS 0.4 12/12/2013 1015   EOSABS 0.2 05/15/2024 1119   EOSABS 0.2 06/27/2023 1517   EOSABS 0.1 12/12/2013 1015   BASOSABS 0.0 05/15/2024 1119   BASOSABS 0.0 06/27/2023 1517   BASOSABS 0.1 12/12/2013 1015   Lab Results  Component Value Date   IRON  58 05/15/2024   TIBC 280 05/15/2024   IRONPCTSAT 21 05/15/2024   Lab Results  Component Value Date   FERRITIN 52 05/15/2024      STUDIES: No results found.  ASSESSMENT & PLAN:    Iron  deficiency anemia: Resolved.  Patient's hemoglobin and iron  stores continue to be within normal limits.  Her most recent colonoscopy and EGD on May 02, 2020 did not reveal any distinct pathology.  She does not require additional IV Feraheme  today.  Patient last received treatment on July 22, 2022.  Patient has requested to continue to be followed in clinic, but less frequently therefore she will return to clinic in 6 months with repeat laboratory work, further evaluation, and consideration of treatment if necessary.   History of melanoma: Melanoma of left thigh status post resection in 1993, exact stage is not known, metastatic to inguinal lymph node. No evidence of recurrent disease.  I spent a total of 20 minutes reviewing chart data, face-to-face evaluation with the patient, counseling and coordination of care as detailed above.    Patient expressed understanding and was in agreement with this plan. She also understands that She can call clinic at any time with any questions, concerns, or complaints.    Janet PARAS Jacobo, MD   05/16/2024 11:50 AM

## 2024-05-17 ENCOUNTER — Encounter: Payer: Self-pay | Admitting: Oncology

## 2024-05-26 ENCOUNTER — Other Ambulatory Visit: Payer: Self-pay | Admitting: Nurse Practitioner

## 2024-05-26 DIAGNOSIS — E039 Hypothyroidism, unspecified: Secondary | ICD-10-CM

## 2024-05-29 NOTE — Telephone Encounter (Signed)
 Requested Prescriptions  Pending Prescriptions Disp Refills   levothyroxine  (SYNTHROID ) 125 MCG tablet [Pharmacy Med Name: LEVOTHYROXIN TAB 125MCG] 90 tablet 0    Sig: TAKE 1 TABLET DAILY BEFORE BREAKFAST     Endocrinology:  Hypothyroid Agents Failed - 05/29/2024 10:46 AM      Failed - TSH in normal range and within 360 days    TSH  Date Value Ref Range Status  01/02/2024 0.068 (L) 0.450 - 4.500 uIU/mL Final         Passed - Valid encounter within last 12 months    Recent Outpatient Visits           2 months ago Acute cystitis with hematuria   [place] [facility] [place], Melanie T, NP   4 months ago Prediabetes   [place] [facility] Melvin Pao, NP

## 2024-07-03 ENCOUNTER — Encounter: Payer: Self-pay | Admitting: Nurse Practitioner

## 2024-07-03 ENCOUNTER — Ambulatory Visit: Admitting: Nurse Practitioner

## 2024-07-03 VITALS — BP 115/64 | HR 53 | Temp 98.1°F | Ht 62.01 in | Wt 158.8 lb

## 2024-07-03 DIAGNOSIS — F32A Depression, unspecified: Secondary | ICD-10-CM | POA: Diagnosis not present

## 2024-07-03 DIAGNOSIS — E039 Hypothyroidism, unspecified: Secondary | ICD-10-CM | POA: Diagnosis not present

## 2024-07-03 DIAGNOSIS — Z87440 Personal history of urinary (tract) infections: Secondary | ICD-10-CM | POA: Diagnosis not present

## 2024-07-03 DIAGNOSIS — D509 Iron deficiency anemia, unspecified: Secondary | ICD-10-CM | POA: Diagnosis not present

## 2024-07-03 DIAGNOSIS — R7303 Prediabetes: Secondary | ICD-10-CM

## 2024-07-03 DIAGNOSIS — D649 Anemia, unspecified: Secondary | ICD-10-CM

## 2024-07-03 DIAGNOSIS — F419 Anxiety disorder, unspecified: Secondary | ICD-10-CM

## 2024-07-03 DIAGNOSIS — E785 Hyperlipidemia, unspecified: Secondary | ICD-10-CM

## 2024-07-03 DIAGNOSIS — E669 Obesity, unspecified: Secondary | ICD-10-CM | POA: Diagnosis not present

## 2024-07-03 MED ORDER — PRAVASTATIN SODIUM 20 MG PO TABS: 20.0000 mg | ORAL_TABLET | Freq: Every day | ORAL | 1 refills | Status: AC

## 2024-07-03 MED ORDER — CITALOPRAM HYDROBROMIDE 20 MG PO TABS: 20.0000 mg | ORAL_TABLET | Freq: Every day | ORAL | 1 refills | Status: AC

## 2024-07-03 MED ORDER — LEVOTHYROXINE SODIUM 125 MCG PO TABS: 125.0000 ug | ORAL_TABLET | Freq: Every day | ORAL | 0 refills | Status: AC

## 2024-07-03 NOTE — Progress Notes (Signed)
 "  BP 115/64 (BP Location: Right Arm, Patient Position: Sitting, Cuff Size: Normal)   Pulse (!) 53   Temp 98.1 F (36.7 C) (Oral)   Ht 5' 2.01 (1.575 m)   Wt 158 lb 12.8 oz (72 kg)   SpO2 95%   BMI 29.04 kg/m    Subjective:    Patient ID: Janet Rice, female    DOB: [birth year], 78 y.o.   MRN: [id number]  HPI: Janet Rice is a 78 y.o. female  Chief Complaint  Patient presents with   office visit    6 month F/u. Patient stated she may have a prolapse bladder and may need to see a urologist. She feels pressure later in the day and she is urinating frequently    HYPOTHYROIDISM Taking 125mcg daily. Except on Sunday she takes a 1/2 a pill.  Feels like it is working well for her.  Denies concerns at visit today.  Thyroid  control status:controlled Satisfied with current treatment? yes Medication side effects: no Medication compliance: excellent compliance Etiology of hypothyroidism:  Recent dose adjustment:no Fatigue: no Cold intolerance: no Heat intolerance: no Weight gain: no Weight loss: no Constipation: no Diarrhea/loose stools: no Palpitations: no Lower extremity edema: no Anxiety/depressed mood: no   HYPERLIPIDEMIA Still tolerating the pravastatin  well. She does have occasional muscle pains but feels like she has gotten used to it over the years. Hyperlipidemia status: excellent compliance Satisfied with current treatment?  no Side effects:  muscle aches Medication compliance: excellent compliance Past cholesterol meds: pravastatin  (pravachol )- had side effects at 40mg  had been taking 20mg  and tolerating it well.   Supplements: none Aspirin:  no The 10-year ASCVD risk score (Arnett DK, et al., 2019) is: 18.3%   Values used to calculate the score:     Age: 46 years     Clinically relevant sex: Female     Is Non-Hispanic African American: No     Diabetic: No     Tobacco smoker: No     Systolic Blood Pressure: 115 mmHg     Is BP treated: No     HDL  Cholesterol: 62 mg/dL     Total Cholesterol: 209 mg/dL Chest pain:  no Coronary artery disease:  no Family history CAD:  no Family history early CAD:  no  ANEMIA Followed by Hematology.  Receives PRN iron  infusions.  Receiving infusions as needed.  Last appointment was November. She may not need to see Hematology any longer.  MOOD Feels like her mood is good.  Celexa  is working well for her.  Denies concerns at visit today.   She denies any issues with the medication at this time.  She feels like this is a good dose for her. Denies SI.   Flowsheet Row Office Visit from 07/03/2024 in [facility]  PHQ-9 Total Score 0   Patient states she would like to see a Urologist.  She feels like she is having a prolapsed bladder.  In the afternoon she has a lot of pressure.  She gets up 3-4 times per night to urinate.  Feels like if she puts pressure on her bladder it helps to relieve the pressure.  She has had several UTIs in the last couple of years.    Relevant past medical, surgical, family and social history reviewed and updated as indicated. Interim medical history since our last visit reviewed. Allergies and medications reviewed and updated.  Review of Systems  Constitutional:  Negative for fatigue and unexpected weight  change.  Eyes:  Negative for visual disturbance.  Respiratory:  Negative for cough, chest tightness and shortness of breath.   Cardiovascular:  Negative for chest pain, palpitations and leg swelling.  Gastrointestinal:  Negative for constipation and diarrhea.  Endocrine: Negative for cold intolerance and heat intolerance.  Neurological:  Negative for dizziness and headaches.  Psychiatric/Behavioral:  Negative for dysphoric mood and suicidal ideas. The patient is not nervous/anxious.     Per HPI unless specifically indicated above     Objective:    BP 115/64 (BP Location: Right Arm, Patient Position: Sitting, Cuff Size: Normal)   Pulse (!) 53    Temp 98.1 F (36.7 C) (Oral)   Ht 5' 2.01 (1.575 m)   Wt 158 lb 12.8 oz (72 kg)   SpO2 95%   BMI 29.04 kg/m   Wt Readings from Last 3 Encounters:  07/03/24 158 lb 12.8 oz (72 kg)  05/16/24 159 lb (72.1 kg)  03/30/24 160 lb 6.4 oz (72.8 kg)    Physical Exam Vitals and nursing note reviewed.  Constitutional:      General: She is not in acute distress.    Appearance: Normal appearance. She is obese. She is not ill-appearing, toxic-appearing or diaphoretic.  HENT:     Head: Normocephalic.     Right Ear: External ear normal.     Left Ear: External ear normal.     Nose: Nose normal.     Mouth/Throat:     Mouth: Mucous membranes are moist.     Pharynx: Oropharynx is clear.  Eyes:     General:        Right eye: No discharge.        Left eye: No discharge.     Extraocular Movements: Extraocular movements intact.     Conjunctiva/sclera: Conjunctivae normal.     Pupils: Pupils are equal, round, and reactive to light.  Cardiovascular:     Rate and Rhythm: Normal rate and regular rhythm.     Heart sounds: No murmur heard. Pulmonary:     Effort: Pulmonary effort is normal. No respiratory distress.     Breath sounds: Normal breath sounds. No wheezing or rales.  Musculoskeletal:     Cervical back: Normal range of motion and neck supple.  Skin:    General: Skin is warm and dry.     Capillary Refill: Capillary refill takes less than 2 seconds.  Neurological:     General: No focal deficit present.     Mental Status: She is alert and oriented to person, place, and time. Mental status is at baseline.  Psychiatric:        Mood and Affect: Mood normal.        Behavior: Behavior normal.        Thought Content: Thought content normal.        Judgment: Judgment normal.     Results for orders placed or performed in visit on 05/15/24  Iron  and TIBC   Collection Time: 05/15/24 11:19 AM  Result Value Ref Range   Iron  58 28 - 170 ug/dL   TIBC 719 749 - 549 ug/dL   Saturation Ratios 21  10.4 - 31.8 %   UIBC 222 ug/dL  Ferritin   Collection Time: 05/15/24 11:19 AM  Result Value Ref Range   Ferritin 52 11 - 307 ng/mL  CBC with Differential/Platelet   Collection Time: 05/15/24 11:19 AM  Result Value Ref Range   WBC 6.1 4.0 - 10.5 K/uL   RBC  4.73 3.87 - 5.11 MIL/uL   Hemoglobin 13.9 12.0 - 15.0 g/dL   HCT 57.8 63.9 - 53.9 %   MCV 89.0 80.0 - 100.0 fL   MCH 29.4 26.0 - 34.0 pg   MCHC 33.0 30.0 - 36.0 g/dL   RDW 85.9 88.4 - 84.4 %   Platelets 116 (L) 150 - 400 K/uL   nRBC 0.0 0.0 - 0.2 %   Neutrophils Relative % 71 %   Neutro Abs 4.4 1.7 - 7.7 K/uL   Lymphocytes Relative 18 %   Lymphs Abs 1.1 0.7 - 4.0 K/uL   Monocytes Relative 7 %   Monocytes Absolute 0.4 0.1 - 1.0 K/uL   Eosinophils Relative 3 %   Eosinophils Absolute 0.2 0.0 - 0.5 K/uL   Basophils Relative 1 %   Basophils Absolute 0.0 0.0 - 0.1 K/uL   Immature Granulocytes 0 %   Abs Immature Granulocytes 0.01 0.00 - 0.07 K/uL      Assessment & Plan:   Problem List Items Addressed This Visit       Endocrine   Hypothyroidism   Chronic.  Controlled.  Continue with current medication regimen on levothyroxine  125mcg daily and 1/2 tab on Sunday.  Refills sent today.  Labs ordered today.  Return to clinic in 6 months for reevaluation.  Call sooner if concerns arise.       Relevant Medications   levothyroxine  (SYNTHROID ) 125 MCG tablet   Other Relevant Orders   T4   TSH     Other   Iron  deficiency anemia   Chronic.  Followed up with Hematology in November.  Recent note reviewed.  Continue to follow up with specialist. Labs were well controlled.       Hyperlipidemia   Chronic.  Controlled.  Continue with current medication regimen of Pravastatin .  Did not tolerate Pravastatin  40mg .  Labs ordered today.  Return to clinic in 6 months for reevaluation.  Call sooner if concerns arise.       Relevant Medications   pravastatin  (PRAVACHOL ) 20 MG tablet   Other Relevant Orders   Hemoglobin A1c   Lipid panel    Obesity (BMI 30-39.9)   Recommended eating smaller high protein, low fat meals more frequently and exercising 30 mins a day 5 times a week with a goal of 10-15lb weight loss in the next 3 months.       Relevant Orders   Hemoglobin A1c   Anxiety and depression   Chronic.  Controlled.  Continue with current medication regimen of Citalopram  20mg .  Refills sent today. Labs ordered today.  Return to clinic in 6 months for reevaluation.  Call sooner if concerns arise.       Relevant Medications   citalopram  (CELEXA ) 20 MG tablet   Normocytic anemia   Chronic.  Followed up with Hematology in November.  Recent note reviewed.  Continue to follow up with specialist. Labs were well controlled.       Prediabetes - Primary   Chronic.  Controlled.  Continue with current medication regimen.  Labs ordered today.  Return to clinic in 6 months for reevaluation.  Call sooner if concerns arise.        Relevant Orders   Comprehensive metabolic panel with GFR   Hemoglobin A1c   Other Visit Diagnoses       History of recurrent UTIs       Relevant Orders   Ambulatory referral to Urology          Follow  up plan: Return in about 6 months (around 01/01/2025) for HTN, HLD, DM2 FU.      "

## 2024-07-03 NOTE — Assessment & Plan Note (Signed)
 Chronic.  Followed up with Hematology in November.  Recent note reviewed.  Continue to follow up with specialist. Labs were well controlled.

## 2024-07-03 NOTE — Assessment & Plan Note (Signed)
 Chronic.  Controlled.  Continue with current medication regimen.  Labs ordered today.  Return to clinic in 6 months for reevaluation.  Call sooner if concerns arise.  ? ?

## 2024-07-03 NOTE — Assessment & Plan Note (Signed)
 Chronic.  Controlled.  Continue with current medication regimen of Citalopram  20mg .  Refills sent today. Labs ordered today.  Return to clinic in 6 months for reevaluation.  Call sooner if concerns arise.

## 2024-07-03 NOTE — Assessment & Plan Note (Signed)
 Chronic.  Controlled.  Continue with current medication regimen of Pravastatin .  Did not tolerate Pravastatin  40mg .  Labs ordered today.  Return to clinic in 6 months for reevaluation.  Call sooner if concerns arise.

## 2024-07-03 NOTE — Assessment & Plan Note (Signed)
 Recommended eating smaller high protein, low fat meals more frequently and exercising 30 mins a day 5 times a week with a goal of 10-15lb weight loss in the next 3 months.

## 2024-07-03 NOTE — Assessment & Plan Note (Signed)
Chronic.  Controlled.  Continue with current medication regimen on levothyroxine daily and 1/2 tab on Sunday.  Refills sent today.  Labs ordered today.  Return to clinic in 6 months for reevaluation.  Call sooner if concerns arise.

## 2024-07-04 LAB — COMPREHENSIVE METABOLIC PANEL WITH GFR
ALT: 22 IU/L (ref 0–32)
AST: 27 IU/L (ref 0–40)
Albumin: 4.1 g/dL (ref 3.8–4.8)
Alkaline Phosphatase: 120 IU/L (ref 49–135)
BUN/Creatinine Ratio: 10 — ABNORMAL LOW (ref 12–28)
BUN: 9 mg/dL (ref 8–27)
Bilirubin Total: 0.4 mg/dL (ref 0.0–1.2)
CO2: 29 mmol/L (ref 20–29)
Calcium: 10.1 mg/dL (ref 8.7–10.3)
Chloride: 96 mmol/L (ref 96–106)
Creatinine, Ser: 0.93 mg/dL (ref 0.57–1.00)
Globulin, Total: 2.4 g/dL (ref 1.5–4.5)
Glucose: 85 mg/dL (ref 70–99)
Potassium: 5.2 mmol/L (ref 3.5–5.2)
Sodium: 136 mmol/L (ref 134–144)
Total Protein: 6.5 g/dL (ref 6.0–8.5)
eGFR: 63 mL/min/1.73

## 2024-07-04 LAB — T4: T4, Total: 10 ug/dL (ref 4.5–12.0)

## 2024-07-04 LAB — LIPID PANEL
Chol/HDL Ratio: 2.8 ratio (ref 0.0–4.4)
Cholesterol, Total: 204 mg/dL — ABNORMAL HIGH (ref 100–199)
HDL: 72 mg/dL
LDL Chol Calc (NIH): 117 mg/dL — ABNORMAL HIGH (ref 0–99)
Triglycerides: 85 mg/dL (ref 0–149)
VLDL Cholesterol Cal: 15 mg/dL (ref 5–40)

## 2024-07-04 LAB — TSH: TSH: 0.119 u[IU]/mL — ABNORMAL LOW (ref 0.450–4.500)

## 2024-07-04 LAB — HEMOGLOBIN A1C
Est. average glucose Bld gHb Est-mCnc: 108 mg/dL
Hgb A1c MFr Bld: 5.4 % (ref 4.8–5.6)

## 2024-07-06 ENCOUNTER — Ambulatory Visit: Payer: Self-pay | Admitting: Nurse Practitioner

## 2024-07-25 NOTE — Progress Notes (Signed)
 "    07/26/2024 2:56 PM   Janet Rice Oct 07, 1945 [id number]  Referring provider: Melvin Pao, NP [address] [place],  [place] [address]  Urological history: 1. None   Chief Complaint  Patient presents with   Over Active Bladder   HPI: Janet Rice is a 79 y.o. woman who presents today for feeling like she has a prolapsed bladder, nocturia x 3-4, and rUTI's.    Previous records reviewed  She has been experiencing some lower pelvic pressure especially at nighttime.  She is having nocturia x 5.  She states she drinks a lot of diet Sprite, but admits she does not drink a lot of water.  She is concerned that her bladder may be prolapse as her mom dealt with the prolapse.    She has had 1-7 daytime voids, 3 more episodes of nocturia with strong urge to urinate.  She has stress urinary continence.  Leaking 1-2 times a week.  She wears 1 panty liner daily.  She engages in fluid limitation.  She also engages in toilet mapping.  Patient denies any modifying or aggravating factors.  Patient denies any recent UTI's, gross hematuria, dysuria or suprapubic/flank pain.  Patient denies any fevers, chills, nausea or vomiting.    PVR 13 mL  Serum creatinine (06/2024) 0.93  Hemoglobin A1c (06/2024) 5.4    PMH: Past Medical History:  Diagnosis Date   Adenomatous colon polyp    Allergy    Sulfa   Anemia    Arthritis    Basal cell carcinoma    left supratip of nose nodular edges involved 08/27/20 Dr. Chrystie, left postauricular sulcus nodular bcc 01/02/20 exc 01/23/20 neg margins   Blood transfusion without reported diagnosis 12-30-2018   Cataract    Childhood asthma    AS A CHILD   Chronic heartburn    On omeprazole    Complication of anesthesia    HARD TO WAKE UP   Dyspnea    low hgb. and iron   chronic problem   External hemorrhoids    Frequency of urination    Frequent loose stools    GERD (gastroesophageal reflux disease)    Heart murmur    Heme positive  stool    History of hiatal hernia    large   Hyperlipidemia    Hypothyroidism    Iron  deficiency anemia    Melanoma (HCC)    left thigh s/p LN removal left groin   Metastatic melanoma (HCC)    Followed by Dr Wilder, previous chemo, no recurrence, 2008?   Multinodular goiter    Osteoporosis    SCC (squamous cell carcinoma)    skin    Surgical History: Past Surgical History:  Procedure Laterality Date   ABDOMINAL HYSTERECTOMY  1984   BACK SURGERY  03/2016   LUMBAR   CARPAL TUNNEL RELEASE Right 09/07/2016   Procedure: CARPAL TUNNEL RELEASE;  Surgeon: Franky Cranker, MD;  Location: [facility] ORS;  Service: Orthopedics;  Laterality: Right;   CHOLECYSTECTOMY  1990   COLONOSCOPY  08/2006   Four sessile polyps found and removed in sigmoid colon at splenic flexure and ascending colon. 7 mm in size. Another removed from transverse colon 4 mm. Path Report - showed tubular adenoma and hyperplastic polyp. Advised to repeat in 3.5 years (02/2010)   COLONOSCOPY  09/10/2009   8 mm polyp in sigmoid colon, removed, 4 mm polyp in descending colon, removed. Internal hemorrhoids   ESOPHAGOGASTRODUODENOSCOPY  09/10/2009   Large hiatia hernia, esophagus normal,  mulitple small sessile polyps w/no stigmata of recent bleeding found. Mildy erythermatous mucosa w/no bleeding found in gatric antrum. Normal duodenum. 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 PATH - no active inflammation, antral mucosa w/mild foveolar hyperplasia   FRACTURE SURGERY     Ankle   JOINT REPLACEMENT  Hips   Lymph Node Removal  12/2005   Left inguinal lymph node dissection   MELANOMA EXCISION Left 1993   Left thigh   ORIF ANKLE FRACTURE Right    Dr. Marchia   SPINE SURGERY     TOTAL HIP ARTHROPLASTY Right 11/18/2016   Procedure: TOTAL HIP ARTHROPLASTY;  Surgeon: Marchia Drivers, MD;  Location: [facility] OR[facility];  Service: Orthopedics;  Laterality: Right;   TOTAL HIP ARTHROPLASTY     left    Home Medications:  Allergies  as of 07/26/2024       Reactions   Misc. Sulfonamide Containing Compounds Other (See Comments)   Sulfa Antibiotics Rash        Medication List        Accurate as of July 26, 2024  2:56 PM. If you have any questions, ask your nurse or doctor.          acetaminophen  650 MG CR tablet Commonly known as: TYLENOL  Take 1,300 mg by mouth daily.   B COMPLEX PO Take by mouth.   Calcium  Carbonate-Vitamin D  600-400 MG-UNIT tablet Take by mouth.   citalopram  20 MG tablet Commonly known as: CELEXA  Take 1 tablet (20 mg total) by mouth daily.   desonide 0.05 % cream Commonly known as: DESOWEN Apply topically 2 (two) times daily.   estradiol  0.01 % Crea vaginal cream Commonly known as: ESTRACE  Apply one pea-sized amount around the opening of the urethra daily for 2 weeks, then 3 times weekly moving forward.   Eye Vitamins & Minerals Tabs Take 1 tablet by mouth daily.   Ferrous Sulfate  Dried 45 MG Tbcr Take 45 mg by mouth daily with lunch.   levothyroxine  125 MCG tablet Commonly known as: SYNTHROID  Take 1 tablet (125 mcg total) by mouth daily before breakfast.   loperamide  2 MG capsule Commonly known as: IMODIUM  Take 2 mg by mouth every morning.   pravastatin  20 MG tablet Commonly known as: PRAVACHOL  Take 1 tablet (20 mg total) by mouth daily.        Allergies: Allergies[1]  Family History: Family History  Problem Relation Age of Onset   Aneurysm Mother    Heart disease Mother    Arthritis Mother    Colon polyps Father    Diabetes Father    Dementia Father    Stroke Father        TIA   Macular degeneration Father    Vision loss Father    Hypothyroidism Daughter    Hypothyroidism Son    Leukemia Maternal Grandfather    Skin cancer Paternal Grandfather    Cancer Paternal Grandfather    Diabetes Other    Colon polyps Other    Colon cancer Neg Hx    Breast cancer Neg Hx    Stomach cancer Neg Hx    Rectal cancer Neg Hx    Esophageal cancer Neg Hx      Social History:  reports that she quit smoking about 46 years ago. Her smoking use included cigarettes. She started smoking about 54 years ago. She has a 4 pack-year smoking history. She has never used smokeless tobacco. She reports that she does not drink alcohol  and does not use  drugs.  ROS: Pertinent ROS in HPI  Physical Exam: BP 110/72   Pulse 68   Wt 154 lb (69.9 kg)   SpO2 96%   BMI 28.16 kg/m   Constitutional:  Well nourished. Alert and oriented, No acute distress. HEENT: [place] AT, moist mucus membranes.  Trachea midline Cardiovascular: No clubbing, cyanosis, or edema. Respiratory: Normal respiratory effort, no increased work of breathing. GU: No CVA tenderness.  No bladder fullness or masses.  Recession of labia minora, dry, pale vulvar vaginal mucosa and loss of mucosal ridges and folds.  The top portion of the internal labia are fused. Normal urethral meatus, no lesions, no prolapse, no discharge.   Small urethral caruncle noted. No bladder fullness, tenderness or masses. Pale vagina mucosa, poor estrogen effect, no discharge, no lesions, poor pelvic support, grade I cystocele and no rectocele noted.  Incontinence was demonstrated on Valsalva.  Anus and perineum are without rashes or lesions.     Neurologic: Grossly intact, no focal deficits, moving all 4 extremities. Psychiatric: Normal mood and affect.    Laboratory Data: See Epic and HPI   I have reviewed the labs.   Pertinent Imaging:  07/26/24 09:54  Scan Result 13mL    Assessment & Plan:    1. LUTS - criteria for recurrent UTI has not been met  - We are starting vaginal estrogen cream today and will reassess - She will notify us  of any UTI symptoms in the interim  2. Genitourinary Syndrome of Menopause (GSM)  - Explained that GSM is a common condition that affects women during and after menopause. It is characterized by a cluster of symptoms related to the genital and urinary tracts, including: vaginal  dryness, pain or discomfort during intercourse (dyspareunia), burning or irritation in the vulva or vagina, thinning or loss of vaginal tissue, frequent urination, urgency (feeling the need to urinate immediately), incontinence (loss of bladder control), and pain or burning during urination - explained that GSM is primarily caused by the decline in estrogen levels during menopause. This leads to changes in the vaginal tissue, making it thinner, drier, and more susceptible to irritation. Other factors that may contribute to GSM include: Smoking, certain medications (e.g., chemotherapy drugs, and pelvic organ prolapse - advised that First-line treatment options are: Local low-dose vaginal estrogen (cream, ring, tablet), which improves dryness, irritation, dyspareunia and it is safe for most patients, including those with history of breast cancer (with multidisciplinary input). - advised they can also use vaginal moisturizers and lubricants (alone or combined) and avoid irritants and harsh cleansers.   - Reassured that there is no evidence linking local estrogen to breast or endometrial cancer - Explained that it may take up to 8 months of consistent application of the vaginal estrogen cream to cause unnecessary changes needed to address GSM and that this will be a lifelong medication - Will start with vaginal estrogen cream, applying a pea-sized amount with a fingertip just inside the vaginal introitus every night for 2 weeks and then continuing on to Monday, Wednesday and Friday nights - estradiol  (ESTRACE ) 0.01 % CREA vaginal cream, Apply one pea-sized amount around the opening of the urethra daily for 2 weeks, then 3 times weekly moving forward, sent to pharmacy    3. Cystocele - grade I   - leakage with Valsalva noted - will reassess upon return  Return for repeat pelvic exam and symptom recheck .  These notes generated with voice recognition software. I  apologize for typographical errors.  Janet CORNWALL, PA-C  [facility] [address]  [address] [place], [place] [address] [phone]     [1]  Allergies Allergen Reactions   Misc. Sulfonamide Containing Compounds Other (See Comments)   Sulfa Antibiotics Rash   "

## 2024-07-26 ENCOUNTER — Encounter: Payer: Self-pay | Admitting: Urology

## 2024-07-26 ENCOUNTER — Ambulatory Visit: Admitting: Urology

## 2024-07-26 VITALS — BP 110/72 | HR 68 | Wt 154.0 lb

## 2024-07-26 DIAGNOSIS — N958 Other specified menopausal and perimenopausal disorders: Secondary | ICD-10-CM | POA: Diagnosis not present

## 2024-07-26 DIAGNOSIS — R399 Unspecified symptoms and signs involving the genitourinary system: Secondary | ICD-10-CM

## 2024-07-26 DIAGNOSIS — N8111 Cystocele, midline: Secondary | ICD-10-CM | POA: Diagnosis not present

## 2024-07-26 LAB — BLADDER SCAN AMB NON-IMAGING

## 2024-07-26 MED ORDER — ESTRADIOL 0.01 % VA CREA: TOPICAL_CREAM | VAGINAL | 12 refills | Status: AC

## 2024-08-27 ENCOUNTER — Ambulatory Visit: Admitting: Urology

## 2024-10-09 ENCOUNTER — Ambulatory Visit

## 2024-11-13 ENCOUNTER — Inpatient Hospital Stay

## 2024-11-14 ENCOUNTER — Inpatient Hospital Stay: Admitting: Nurse Practitioner

## 2024-11-14 ENCOUNTER — Inpatient Hospital Stay

## 2025-01-01 ENCOUNTER — Ambulatory Visit: Admitting: Nurse Practitioner
